# Patient Record
Sex: Female | Born: 1945
Health system: Southern US, Community
[De-identification: ages and names within clinical notes are randomized; demographics above are authoritative.]

## PROBLEM LIST (undated history)

## (undated) DIAGNOSIS — G709 Myoneural disorder, unspecified: Secondary | ICD-10-CM

## (undated) DIAGNOSIS — R7303 Prediabetes: Secondary | ICD-10-CM

## (undated) DIAGNOSIS — E785 Hyperlipidemia, unspecified: Secondary | ICD-10-CM

## (undated) DIAGNOSIS — R5383 Other fatigue: Secondary | ICD-10-CM

## (undated) DIAGNOSIS — E039 Hypothyroidism, unspecified: Secondary | ICD-10-CM

## (undated) DIAGNOSIS — M791 Myalgia, unspecified site: Secondary | ICD-10-CM

## (undated) DIAGNOSIS — H269 Unspecified cataract: Secondary | ICD-10-CM

## (undated) DIAGNOSIS — M1990 Unspecified osteoarthritis, unspecified site: Secondary | ICD-10-CM

## (undated) DIAGNOSIS — T7840XA Allergy, unspecified, initial encounter: Secondary | ICD-10-CM

## (undated) DIAGNOSIS — K648 Other hemorrhoids: Secondary | ICD-10-CM

## (undated) DIAGNOSIS — D649 Anemia, unspecified: Secondary | ICD-10-CM

## (undated) DIAGNOSIS — I8390 Asymptomatic varicose veins of unspecified lower extremity: Secondary | ICD-10-CM

## (undated) DIAGNOSIS — E669 Obesity, unspecified: Secondary | ICD-10-CM

## (undated) DIAGNOSIS — N189 Chronic kidney disease, unspecified: Secondary | ICD-10-CM

## (undated) DIAGNOSIS — E119 Type 2 diabetes mellitus without complications: Secondary | ICD-10-CM

## (undated) DIAGNOSIS — C4491 Basal cell carcinoma of skin, unspecified: Secondary | ICD-10-CM

## (undated) DIAGNOSIS — R0789 Other chest pain: Secondary | ICD-10-CM

## (undated) DIAGNOSIS — K219 Gastro-esophageal reflux disease without esophagitis: Secondary | ICD-10-CM

## (undated) DIAGNOSIS — IMO0002 Reserved for concepts with insufficient information to code with codable children: Secondary | ICD-10-CM

## (undated) DIAGNOSIS — H409 Unspecified glaucoma: Secondary | ICD-10-CM

## (undated) HISTORY — DX: Other chest pain: R07.89

## (undated) HISTORY — PX: CATARACT EXTRACTION: SUR2

## (undated) HISTORY — DX: Anemia, unspecified: D64.9

## (undated) HISTORY — DX: Gastro-esophageal reflux disease without esophagitis: K21.9

## (undated) HISTORY — DX: Hyperlipidemia, unspecified: E78.5

## (undated) HISTORY — DX: Basal cell carcinoma of skin, unspecified: C44.91

## (undated) HISTORY — DX: Asymptomatic varicose veins of unspecified lower extremity: I83.90

## (undated) HISTORY — PX: ROTATOR CUFF REPAIR: SHX139

## (undated) HISTORY — PX: NASAL SINUS SURGERY: SHX719

## (undated) HISTORY — DX: Prediabetes: R73.03

## (undated) HISTORY — DX: Myalgia, unspecified site: M79.10

## (undated) HISTORY — DX: Other fatigue: R53.83

## (undated) HISTORY — PX: BREAST SURGERY: SHX581

## (undated) HISTORY — DX: Chronic kidney disease, unspecified: N18.9

## (undated) HISTORY — DX: Allergy, unspecified, initial encounter: T78.40XA

## (undated) HISTORY — DX: Unspecified glaucoma: H40.9

## (undated) HISTORY — DX: Type 2 diabetes mellitus without complications: E11.9

## (undated) HISTORY — DX: Reserved for concepts with insufficient information to code with codable children: IMO0002

## (undated) HISTORY — PX: TONSILLECTOMY AND ADENOIDECTOMY: SUR1326

## (undated) HISTORY — DX: Obesity, unspecified: E66.9

## (undated) HISTORY — PX: ABDOMINAL HYSTERECTOMY: SHX81

## (undated) HISTORY — DX: Other hemorrhoids: K64.8

## (undated) HISTORY — DX: Unspecified cataract: H26.9

## (undated) HISTORY — PX: EYE SURGERY: SHX253

## (undated) HISTORY — DX: Hypothyroidism, unspecified: E03.9

---

## 1998-04-06 ENCOUNTER — Emergency Department (HOSPITAL_COMMUNITY): Admission: EM | Admit: 1998-04-06 | Discharge: 1998-04-06 | Payer: Self-pay | Admitting: *Deleted

## 1998-04-16 ENCOUNTER — Ambulatory Visit (HOSPITAL_COMMUNITY): Admission: RE | Admit: 1998-04-16 | Discharge: 1998-04-16 | Payer: Self-pay | Admitting: *Deleted

## 1998-05-05 ENCOUNTER — Ambulatory Visit (HOSPITAL_COMMUNITY): Admission: RE | Admit: 1998-05-05 | Discharge: 1998-05-05 | Payer: Self-pay | Admitting: Cardiovascular Disease

## 1998-06-11 ENCOUNTER — Ambulatory Visit (HOSPITAL_COMMUNITY): Admission: RE | Admit: 1998-06-11 | Discharge: 1998-06-11 | Payer: Self-pay | Admitting: *Deleted

## 1998-06-26 ENCOUNTER — Encounter: Payer: Self-pay | Admitting: Internal Medicine

## 1998-07-31 ENCOUNTER — Encounter: Payer: Self-pay | Admitting: Gastroenterology

## 1998-08-13 ENCOUNTER — Other Ambulatory Visit: Admission: RE | Admit: 1998-08-13 | Discharge: 1998-08-13 | Payer: Self-pay | Admitting: *Deleted

## 2001-09-10 ENCOUNTER — Encounter: Payer: Self-pay | Admitting: Internal Medicine

## 2001-09-10 ENCOUNTER — Ambulatory Visit (HOSPITAL_COMMUNITY): Admission: RE | Admit: 2001-09-10 | Discharge: 2001-09-10 | Payer: Self-pay | Admitting: Internal Medicine

## 2001-11-16 ENCOUNTER — Encounter: Payer: Self-pay | Admitting: Internal Medicine

## 2001-11-16 ENCOUNTER — Ambulatory Visit (HOSPITAL_COMMUNITY): Admission: RE | Admit: 2001-11-16 | Discharge: 2001-11-16 | Payer: Self-pay | Admitting: Internal Medicine

## 2003-02-19 ENCOUNTER — Ambulatory Visit (HOSPITAL_COMMUNITY): Admission: RE | Admit: 2003-02-19 | Discharge: 2003-02-19 | Payer: Self-pay | Admitting: Internal Medicine

## 2003-02-19 ENCOUNTER — Encounter: Payer: Self-pay | Admitting: Internal Medicine

## 2006-01-23 ENCOUNTER — Other Ambulatory Visit: Admission: RE | Admit: 2006-01-23 | Discharge: 2006-01-23 | Payer: Self-pay | Admitting: Internal Medicine

## 2008-10-15 ENCOUNTER — Emergency Department (HOSPITAL_COMMUNITY): Admission: EM | Admit: 2008-10-15 | Discharge: 2008-10-15 | Payer: Self-pay | Admitting: Family Medicine

## 2009-04-02 ENCOUNTER — Telehealth: Payer: Self-pay | Admitting: Gastroenterology

## 2009-04-22 ENCOUNTER — Encounter: Admission: RE | Admit: 2009-04-22 | Discharge: 2009-04-22 | Payer: Self-pay | Admitting: Internal Medicine

## 2009-05-04 DIAGNOSIS — K648 Other hemorrhoids: Secondary | ICD-10-CM

## 2009-05-04 DIAGNOSIS — K5730 Diverticulosis of large intestine without perforation or abscess without bleeding: Secondary | ICD-10-CM

## 2009-05-04 DIAGNOSIS — K219 Gastro-esophageal reflux disease without esophagitis: Secondary | ICD-10-CM

## 2009-05-04 HISTORY — DX: Other hemorrhoids: K64.8

## 2009-05-11 ENCOUNTER — Ambulatory Visit: Payer: Self-pay | Admitting: Internal Medicine

## 2009-07-13 ENCOUNTER — Ambulatory Visit: Payer: Self-pay | Admitting: Internal Medicine

## 2010-02-03 ENCOUNTER — Ambulatory Visit (HOSPITAL_COMMUNITY): Admission: RE | Admit: 2010-02-03 | Discharge: 2010-02-03 | Payer: Self-pay | Admitting: Internal Medicine

## 2010-02-03 ENCOUNTER — Encounter: Admission: RE | Admit: 2010-02-03 | Discharge: 2010-02-03 | Payer: Self-pay | Admitting: Internal Medicine

## 2010-02-03 ENCOUNTER — Encounter (INDEPENDENT_AMBULATORY_CARE_PROVIDER_SITE_OTHER): Payer: Self-pay | Admitting: *Deleted

## 2010-02-08 ENCOUNTER — Encounter (INDEPENDENT_AMBULATORY_CARE_PROVIDER_SITE_OTHER): Payer: Self-pay | Admitting: *Deleted

## 2010-03-05 ENCOUNTER — Encounter (INDEPENDENT_AMBULATORY_CARE_PROVIDER_SITE_OTHER): Payer: Self-pay | Admitting: *Deleted

## 2010-04-08 ENCOUNTER — Encounter (INDEPENDENT_AMBULATORY_CARE_PROVIDER_SITE_OTHER): Payer: Self-pay | Admitting: *Deleted

## 2010-05-21 ENCOUNTER — Encounter (INDEPENDENT_AMBULATORY_CARE_PROVIDER_SITE_OTHER): Payer: Self-pay | Admitting: *Deleted

## 2010-05-27 ENCOUNTER — Telehealth: Payer: Self-pay | Admitting: Internal Medicine

## 2010-06-15 ENCOUNTER — Ambulatory Visit: Payer: Self-pay | Admitting: Internal Medicine

## 2010-08-16 ENCOUNTER — Ambulatory Visit: Payer: Self-pay | Admitting: Internal Medicine

## 2010-12-17 ENCOUNTER — Observation Stay (HOSPITAL_COMMUNITY)
Admission: EM | Admit: 2010-12-17 | Discharge: 2010-12-18 | Payer: Self-pay | Source: Home / Self Care | Attending: Internal Medicine | Admitting: Internal Medicine

## 2010-12-17 LAB — POCT I-STAT, CHEM 8
BUN: 13 mg/dL (ref 6–23)
Calcium, Ion: 1.15 mmol/L (ref 1.12–1.32)
Chloride: 110 mEq/L (ref 96–112)
Creatinine, Ser: 0.9 mg/dL (ref 0.4–1.2)
Glucose, Bld: 100 mg/dL — ABNORMAL HIGH (ref 70–99)
TCO2: 23 mmol/L (ref 0–100)

## 2010-12-17 LAB — DIFFERENTIAL
Lymphocytes Relative: 34 % (ref 12–46)
Lymphs Abs: 3.8 10*3/uL (ref 0.7–4.0)
Monocytes Relative: 6 % (ref 3–12)
Neutro Abs: 6.2 10*3/uL (ref 1.7–7.7)
Neutrophils Relative %: 56 % (ref 43–77)

## 2010-12-17 LAB — POCT CARDIAC MARKERS: Troponin i, poc: 0.1 ng/mL — ABNORMAL HIGH (ref 0.00–0.09)

## 2010-12-17 LAB — LIPID PANEL
LDL Cholesterol: 83 mg/dL (ref 0–99)
Total CHOL/HDL Ratio: 3.5 RATIO
Triglycerides: 196 mg/dL — ABNORMAL HIGH (ref <150)
VLDL: 39 mg/dL (ref 0–40)

## 2010-12-17 LAB — CBC
Hemoglobin: 13.8 g/dL (ref 12.0–15.0)
MCH: 29.4 pg (ref 26.0–34.0)
MCV: 86.1 fL (ref 78.0–100.0)
Platelets: 304 10*3/uL (ref 150–400)
RBC: 4.69 MIL/uL (ref 3.87–5.11)
WBC: 11.2 10*3/uL — ABNORMAL HIGH (ref 4.0–10.5)

## 2010-12-17 LAB — CK TOTAL AND CKMB (NOT AT ARMC)
CK, MB: 3.1 ng/mL (ref 0.3–4.0)
Relative Index: INVALID (ref 0.0–2.5)

## 2010-12-17 LAB — TSH: TSH: 1.072 u[IU]/mL (ref 0.350–4.500)

## 2010-12-17 LAB — TROPONIN I: Troponin I: 0.02 ng/mL (ref 0.00–0.06)

## 2010-12-17 LAB — CARDIAC PANEL(CRET KIN+CKTOT+MB+TROPI)
CK, MB: 2 ng/mL (ref 0.3–4.0)
Relative Index: INVALID (ref 0.0–2.5)
Total CK: 73 U/L (ref 7–177)
Troponin I: 0.02 ng/mL (ref 0.00–0.06)

## 2010-12-17 LAB — D-DIMER, QUANTITATIVE: D-Dimer, Quant: <0.22 ug/mL-FEU (ref 0.00–0.48)

## 2010-12-17 NOTE — H&P (Signed)
Brandi, [name]                ACCOUNT NO.:  [id number]  MEDICAL RECORD NO.:  [id number]          PATIENT TYPE:  EMS  LOCATION:  ED                           FACILITY:  [facility]  PHYSICIAN:  Clydia Llano, MD       DATE OF BIRTH:  11/29/1945  DATE OF ADMISSION:  12/17/2010 DATE OF DISCHARGE:                             HISTORY & PHYSICAL   PRIMARY CARE PHYSICIAN:  Lovenia Kim, D.O.  REASON FOR ADMISSION:  Palpitations.  HISTORY OF PRESENT ILLNESS:  [name] is a 65 year old female with history of hypertension and hyperlipidemia, came in complaining about palpitations.  The patient has symptoms on and off for the past 2 weeks, every 2 to 3 nights.  The patient stated that she wakes up at middle of the night with heart racing and feeling tingling in her extremities as well as tingling in her lips and jaw.  The patient also sweats.  The episode lasts for few minutes after the patient relaxes and get back to normal and she goes back to sleep.  The patient had similar symptoms about 3 years ago which was diagnosed as panic attacks and the patient was put on anxiolytic.  The patient mentioned that her husband died in 01/08/23, about 3 years ago and she remembers him now.  Upon initial evaluation in the ED, chest x-ray was negative.  First set of cardiac enzymes was negative.  The point of care troponin was 0.1.  The patient was admitted for further evaluation.  PAST MEDICAL HISTORY: 1. Hypertension. 2. Dyslipidemia. 3. Hypothyroidism. 4. Gastroesophageal reflux disease. 5. Anxiety. 6. Arthritis. 7. Surgical extraction of wisdom tooth. 8. Hysterectomy.  SOCIAL HISTORY:  The patient lives alone.  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  No new stressors at work.  The patient smokes less than half pack per day.  The patient used to smoke 1 pack per day for more than 30 years, cut down 3 years ago.  The patient does not drink or use street drugs.  FAMILY HISTORY:   Father died at 28 secondary to massive heart attack. Mother died at 79 secondary to lung cancer.  ALLERGIES: 1. PENICILLIN. 2. NOVOCAINE. 3. SULFA. 4. TETANUS TOXOID.  HOME MEDICATIONS: 1. Nortriptyline 25 mg 4 times a day. 2. Estradiol 1 mg p.o. daily. 3. Diltiazem ER 240 mg p.o. daily. 4. Lipitor 40 mg p.o. daily. 5. Meloxicam 7.5 p.o. b.i.d. 6. Synthroid 125 mcg p.o. daily. 7. Nexium 40 mg p.o. daily. 8. Calcium 1200 mg p.o. daily. 9. Multivitamin 1 tablet p.o. daily. 10.Aspirin 81 mg p.o. daily. 11.Vitamin D 4000 international units p.o. daily. 12.Align 1 tablet p.o. daily. 13.Vitamin B1 OTC 1 tablet p.o. daily.  REVIEW OF SYSTEMS:  A 12-point review of system was negative except for the symptoms mentioned in the HPI.  PHYSICAL EXAMINATION:  VITAL SIGNS:  Temperature is 98.0, respirations 20, pulse is 92, blood pressure is 134/73. GENERAL:  The patient is well-developed, well-nourished hydrated in no acute distress Caucasian female speaking complete sentences.  Pulse ox is 98% on room air. HEENT:  Head and face normocephalic, atraumatic.  Eyes, normal appearance.  Pupils equal, reactive to light and accommodation. NECK:  Supple and full range of motion.  No soft tissue tenderness. CARDIOVASCULAR:  Regular rate and rhythm.  No murmurs, rubs or gallops. RESPIRATORY:  Clear to auscultation bilaterally. CHEST:  Nontender.  Movement is normal with breathing. ABDOMEN:  Bowel sounds heard.  Soft, nontender or distended.  No hepatosplenomegaly appreciated. EXTREMITIES:  Neurovascularly is intact.  No pedal edema. NEUROLOGIC:  Alert, awake, oriented x3.  Cranial nerves II-XII grossly intact.  Normal reflexes.  Gait normal.  Normal coordination.  Normal speech. SKIN:  Color normal.  No rash.  Warm and dry. PSYCHIATRIC:  Alert, awake and oriented x4.  EKG, normal sinus rhythm.  LABORATORY TEST: 1. Cardiac enzymes, troponin 0.02, CK 85, CK-MB 3.1. 2. CBC, WBC is 11.2,  hemoglobin 13.8, hematocrit 40.4 and platelets     304,000. 3. Chem-8 panel, sodium 141, potassium 3.8, chloride 110, BUN 13,     creatinine 0.8, glucose 100.  RADIOLOGY:  Chest x-ray 2 views showed no active cardiopulmonary disease.  ASSESSMENT/PLAN: 1. Palpitations.  From the history the patient is telling it is likely     panic attack.  The patient will be admitted to rule out any cardiac     involvement or acute coronary syndrome.  The patient will be on     observation on telemetry.  Cardiac enzymes will be done.  EKG will     be repeated in the morning if everything negative.  The patient     probably needs to follow up with her primary care physician to     intensify the anxiolytic therapy. 2. Hypertension, controlled.  Continue her home medication. 3. Dyslipidemia.  We will check fasting lipid profile and will     continue her home medication. 4. Hypothyroidism.  The patient is taking 125 mcg of Synthroid.  We     will check her TSH as high dose of Synthroid can cause     palpitations.     Clydia Llano, MD     ME/MEDQ  D:  12/17/2010  T:  12/17/2010  Job:  [id number]  cc:   Lovenia Kim, D.O. Fax: [phone]  Electronically Signed by Clydia Llano  on 12/17/2010 08:32:33 PM

## 2010-12-18 LAB — CBC
HCT: 37.2 % (ref 36.0–46.0)
MCH: 28.5 pg (ref 26.0–34.0)
MCHC: 32.8 g/dL (ref 30.0–36.0)
MCV: 86.9 fL (ref 78.0–100.0)
Platelets: 262 10*3/uL (ref 150–400)
RDW: 13.8 % (ref 11.5–15.5)

## 2010-12-18 LAB — RAPID URINE DRUG SCREEN, HOSP PERFORMED
Barbiturates: NOT DETECTED
Benzodiazepines: NOT DETECTED
Cocaine: NOT DETECTED
Opiates: NOT DETECTED

## 2010-12-18 LAB — URINALYSIS, ROUTINE W REFLEX MICROSCOPIC
Nitrite: NEGATIVE
Specific Gravity, Urine: 1.012 (ref 1.005–1.030)
Urine Glucose, Fasting: NEGATIVE mg/dL
pH: 7 (ref 5.0–8.0)

## 2010-12-18 LAB — COMPREHENSIVE METABOLIC PANEL
Alkaline Phosphatase: 77 U/L (ref 39–117)
BUN: 10 mg/dL (ref 6–23)
Calcium: 8.8 mg/dL (ref 8.4–10.5)
Creatinine, Ser: 0.89 mg/dL (ref 0.4–1.2)
Glucose, Bld: 103 mg/dL — ABNORMAL HIGH (ref 70–99)
Potassium: 3.8 mEq/L (ref 3.5–5.1)
Total Protein: 6.1 g/dL (ref 6.0–8.3)

## 2010-12-19 LAB — URINE CULTURE
Culture  Setup Time: 201201281136
Culture: NO GROWTH
Special Requests: NEGATIVE

## 2010-12-21 NOTE — Progress Notes (Signed)
Summary: Sooner Appt. Req.  Phone Note From Other Clinic Call back at [phone]   Caller: Lelon Mast from Dr Wilhemena Durie office Call For: [name] Reason for Call: Schedule Patient Appt Summary of Call: Dr Elisabeth Most would like this patient seen before appt date 8-10 for ? obstructions, iron def anemia . Initial call taken by: Tawni Levy,  May 27, 2010 2:45 PM  Follow-up for Phone Call        Pt. will see Dr.Shamere Campas on 05-31-10 at 1:45pm. Lelon Mast will advise pt. of appt/med.list/co-pay/cx.policy and she will fax records to [facility].  Follow-up by: Laureen Ochs LPN,  May 28, 4539 3:09 PM

## 2010-12-21 NOTE — Letter (Signed)
Summary: Lab Report-Spectrum  Lab Report-Spectrum   Imported By: Lamona Curl CMA (AAMA) 05/28/2010 16:36:51  _____________________________________________________________________  External Attachment:    Type:   Image     Comment:   External Document

## 2010-12-21 NOTE — Assessment & Plan Note (Signed)
Summary: abnormal ct, consult colon, low iron   History of Present Illness Visit Type: Follow-up Consult Primary GI MD: Lina Sar MD Primary Provider: Marisue Brooklyn, DO Requesting Provider: Marisue Brooklyn, DO Chief Complaint: Abnormanl CT, Iron def. consult History of Present Illness:   This is a 65 year old white white female with an acute episode of abdominal pain in March 2011 and an abnormal CT scan at that time showing stranding in the left colon and proximal sigmoid colon with colonic wall thickening and diverticulosis. There was increased stool in the left colon, a small right ovarian cyst and a left renal stone. She had an appointment with [place] in May but could not make it. She was found to be iron deficient at the time with a serum iron of 37 and 14% iron saturation. Her B12 level was 342 and folate was 704. She was put on iron but did not take it regularly so the repeat iron saturation in May 2011 was still at 12%. In July 2011, her iron saturation was only at 13%. She has not taken ferrous sulfate twice a day. She denies any diarrhea, abdominal pain of constipation. There is no family history of colon cancer. She was supposed to have a screening colonoscopy last year but cancelled several times and never rescheduled it.   GI Review of Systems    Reports bloating.      Denies abdominal pain, acid reflux, belching, chest pain, dysphagia with liquids, dysphagia with solids, heartburn, loss of appetite, nausea, vomiting, vomiting blood, weight loss, and  weight gain.      Reports change in bowel habits.     Denies anal fissure, black tarry stools, constipation, diarrhea, diverticulosis, fecal incontinence, heme positive stool, hemorrhoids, irritable bowel syndrome, jaundice, light color stool, liver problems, rectal bleeding, and  rectal pain.    Current Medications (verified): 1)  Nortriptyline Hcl 25 Mg Caps (Nortriptyline Hcl) .Marland Kitchen.. 1 Capsule By Mouth Qid 2)  Estrace 1 Mg Tabs  (Estradiol) .Marland Kitchen.. 1 Tablet By Mouth Once Daily 3)  Diltiazem Hcl Er Beads 240 Mg Xr24h-Cap (Diltiazem Hcl Er Beads) .Marland Kitchen.. 1 Capsule By Mouth Once Daily 4)  Lipitor 40 Mg Tabs (Atorvastatin Calcium) .Marland Kitchen.. 1 Tablet By Mouth Once Daily 5)  Meloxicam 7.5 Mg Tabs (Meloxicam) .Marland Kitchen.. 1 Tablet By Mouth Two Times A Day 6)  Synthroid 125 Mcg Tabs (Levothyroxine Sodium) .Marland Kitchen.. 1 Tablet By Mouth Once Daily 7)  Nexium 40 Mg Cpdr (Esomeprazole Magnesium) .Marland Kitchen.. 1 Capsule 8)  Aspirin 81 Mg Tbec (Aspirin) .Marland Kitchen.. 1 Tablet By Mouth Once Daily 9)  Calcium 600 1500 Mg Tabs (Calcium Carbonate) .... 2 Tablets By Mouth Once Daily 10)  Centrum Silver  Tabs (Multiple Vitamins-Minerals) .Marland Kitchen.. 1 Tablet By Mouth Once Daily 11)  Vitamin D3 1000 Unit Tabs (Cholecalciferol) .... Take 4000iu Daily 12)  Probiotic  Caps (Probiotic Product) .... Once Daily 13)  B Complex  Tabs (B Complex Vitamins) .... Once Daily 14)  Ferrous Sulfate 325 (65 Fe) Mg Tbec (Ferrous Sulfate) .... Once Daily  Allergies (verified): 1)  ! * Tetanus 2)  ! Pcn 3)  ! Sulfa 4)  ! Novocain 5)  ! * Ppd/skin Test  Past History:  Past Medical History: Reviewed history from 05/11/2009 and no changes required. Current Problems:  GERD (ICD-530.81) INTERNAL HEMORRHOIDS (ICD-455.0) DIVERTICULOSIS OF COLON (ICD-562.10) Anxiety Disorder Arthritis Hyperlipidemia Hypothyroidism Obesity Ulcers in GI Tract  Past Surgical History: Reviewed history from 05/11/2009 and no changes required. Unremarkable Hysterectomy  Family History: Reviewed history from  05/11/2009 and no changes required. Family History of Heart Disease: Father No FH of Colon Cancer:  Social History: Reviewed history from 05/11/2009 and no changes required. Alcohol Use - no Illicit Drug Use - no Occupation: Recovery Analyst Patient currently smokes.  Daily Caffeine Use  Vital Signs:  Patient profile:   65 year old female Height:      68 inches Weight:      207.25 pounds BMI:      31.63 Pulse rate:   76 / minute Pulse rhythm:   regular BP sitting:   102 / 60  (left arm) Cuff size:   regular  Vitals Entered By: June McMurray CMA Duncan Dull) (June 15, 2010 8:36 AM)  Physical Exam  General:  alert, oriented and no distress. Eyes:  PERRLA, no icterus. Mouth:  No deformity or lesions, dentition normal. Neck:  Supple; no masses or thyromegaly. Lungs:  Clear throughout to auscultation. Heart:  Regular rate and rhythm; no murmurs, rubs,  or bruits. Abdomen:  Soft, nontender and nondistended. No masses, hepatosplenomegaly or hernias noted. Normal bowel sounds. Rectal:  soft, dark Hemoccult negative stool. Extremities:  No clubbing, cyanosis, edema or deformities noted. Skin:  Intact without significant lesions or rashes. Psych:  Alert and cooperative. Normal mood and affect.   Impression & Recommendations:  Problem # 1:  DIVERTICULOSIS OF COLON (ICD-562.10)  She is status post attack of acute abdominal pain of unknown etiology which now has resolved. A CT Scan suggested acute colitis either infectious or ischemic. She is now Hemoccult negative. Patient has iron deficiency which did not respond to oral supplements. She did not take her iron as she was supposed to. We will proceed with a colonoscopy to rule out an occult colon lesion and to rule out inflammatory bowel disease.  Orders: Colonoscopy (Colon)  Problem # 2:  ANEMIA, SECONDARY TO BLOOD LOSS (ICD-280.0)  Patient has persistent iron deficiency. Patient is on iron supplements. We will proceed with a colonoscopy.  Orders: Colonoscopy (Colon)  Patient Instructions: 1)  schedule colonoscopy with MiraLax prep. 2)  Continue iron supplements until 5 days prior to the colonoscopy. 3)  Copy sent to : Dr A.Stevenson 4)  The medication list was reviewed and reconciled.  All changed / newly prescribed medications were explained.  A complete medication list was provided to the patient /  caregiver. Prescriptions: DULCOLAX 5 MG  TBEC (BISACODYL) Day before procedure take 2 at 3pm and 2 at 8pm.  #4 x 0   Entered by:   Lamona Curl CMA (AAMA)   Authorized by:   Hart Carwin MD   Signed by:   Lamona Curl CMA (AAMA) on 06/15/2010   Method used:   Electronically to        [facility] [phone]* (retail)       [address]       [place], [place]  [address]       Ph: [phone] or [phone]       Fax: [phone]   RxID:   [id number] REGLAN 10 MG  TABS (METOCLOPRAMIDE HCL) As per prep instructions.  #2 x 0   Entered by:   Lamona Curl CMA (AAMA)   Authorized by:   Hart Carwin MD   Signed by:   Lamona Curl CMA (AAMA) on 06/15/2010   Method used:   Electronically to        [facility] [phone]* (retail)       [address]  [place], [place]  [address]       Ph: [phone] or [phone]       Fax: [phone]   RxID:   [id number] MIRALAX   POWD (POLYETHYLENE GLYCOL 3350) As per prep  instructions.  #255 grams x 0   Entered by:   Lamona Curl CMA (AAMA)   Authorized by:   Hart Carwin MD   Signed by:   Lamona Curl CMA (AAMA) on 06/15/2010   Method used:   Electronically to        [facility] [phone]* (retail)       [address]       [place], [place]  [address]       Ph: [phone] or [phone]       Fax: [phone]   RxID:   [id number]

## 2010-12-21 NOTE — Procedures (Signed)
Summary: Colonoscopy  Patient: [name] Note: All result statuses are Final unless otherwise noted.  Tests: (1) Colonoscopy (COL)   COL Colonoscopy           DONE     [facility]     [address]     [place], [place]  [address]           COLONOSCOPY PROCEDURE REPORT           PATIENT:  [name], [name]  MR#:  [id number]     BIRTHDATE:  12-11-1945, 64 yrs. old  GENDER:  female     ENDOSCOPIST:  Hedwig Morton. Juanda Chance, MD     REF. BY:  Marisue Brooklyn, D.O.     PROCEDURE DATE:  08/16/2010     PROCEDURE:  Colonoscopy [id number]     ASA CLASS:  Class II     INDICATIONS:  Routine Risk Screening     MEDICATIONS:   Versed 11 mg, Fentanyl 112.5 mcg           DESCRIPTION OF PROCEDURE:   After the risks benefits and     alternatives of the procedure were thoroughly explained, informed     consent was obtained.  Digital rectal exam was performed and     revealed no rectal masses.   The LB [id number]-[id number] endoscope     was introduced through the anus and advanced to the cecum, which     was identified by both the appendix and ileocecal valve, without     limitations.  The quality of the prep was good, using MiraLax.     The instrument was then slowly withdrawn as the colon was fully     examined.     <<PROCEDUREIMAGES>>           FINDINGS:  Mild diverticulosis was found in the sigmoid colon (see     image1, image5, image6, and image7).  This was otherwise a normal     examination of the colon (see image2, image3, image4, and image8).     Retroflexed views in the rectum revealed no abnormalities.    The     scope was then withdrawn from the patient and the procedure     completed.           COMPLICATIONS:  None     ENDOSCOPIC IMPRESSION:     1) Mild diverticulosis in the sigmoid colon     2) Otherwise normal examination     RECOMMENDATIONS:     1) high fiber diet     REPEAT EXAM:  In 10 year(s) for.           ______________________________     Hedwig Morton. Juanda Chance, MD           CC:          n.     eSIGNED:   Hedwig Morton. Brodie at 08/16/2010 10:54 AM           Vic Ripper, [id number]  Note: An exclamation mark (!) indicates a result that was not dispersed into the flowsheet. Document Creation Date: 08/16/2010 10:55 AM _______________________________________________________________________  (1) Order result status: Final Collection or observation date-time: 08/16/2010 10:48 Requested date-time:  Receipt date-time:  Reported date-time:  Referring Physician:   Ordering Physician: Lina Sar [phone]) Specimen Source:  Source: Launa Grill Order Number: [id number] Lab site:   Appended Document: Colonoscopy    Clinical Lists Changes  Observations: Added new observation of COLONNXTDUE: 07/2020 (08/16/2010 12:53)

## 2010-12-21 NOTE — Letter (Signed)
Summary: [facility] Instructions  [facility]  [address] [place], [place] [address]   Phone: [phone]  Fax: [phone]       [name]    29-Apr-1946    MRN: [id number]       Procedure Day /Date: 07/09/10 Friday     Arrival Time: 7:30 am     Procedure Time: 8:30 am     Location of Procedure:                    _ x_  [facility] (4th Floor)  PREPARATION FOR COLONOSCOPY WITH MIRALAX  Starting 5 days prior to your procedure (06/29/10) do not eat nuts, seeds, popcorn, corn, beans, peas,  salads, or any raw vegetables.  Do not take any fiber supplements (e.g. Metamucil, Citrucel, and Benefiber). ____________________________________________________________________________________________________   THE DAY BEFORE YOUR PROCEDURE         DATE: 07/08/10 DAY: Thursday  1   Drink clear liquids the entire day-NO SOLID FOOD  2   Do not drink anything colored red or purple.  Avoid juices with pulp.  No orange juice.  3   Drink at least 64 oz. (8 glasses) of fluid/clear liquids during the day to prevent dehydration and help the prep work efficiently.  CLEAR LIQUIDS INCLUDE: Water Jello Ice Popsicles Tea (sugar ok, no milk/cream) Powdered fruit flavored drinks Coffee (sugar ok, no milk/cream) Gatorade Juice: apple, white grape, white cranberry  Lemonade Clear bullion, consomm, broth Carbonated beverages (any kind) Strained chicken noodle soup Hard Candy  4   Mix the entire bottle of Miralax with 64 oz. of Gatorade/Powerade in the morning and put in the refrigerator to chill.  5   At 3:00 pm take 2 Dulcolax/Bisacodyl tablets.  6   At 4:30 pm take one Reglan/Metoclopramide tablet.  7  Starting at 5:00 pm drink one 8 oz glass of the Miralax mixture every 15-20 minutes until you have finished drinking the entire 64 oz.  You should finish drinking prep around 7:30 or 8:00 pm.  8   If you are nauseated, you may take the 2nd Reglan/Metoclopramide tablet  at 6:30 pm.        9    At 8:00 pm take 2 more DULCOLAX/Bisacodyl tablets.        THE DAY OF YOUR PROCEDURE      DATE:  07/09/10 DAY: Friday  You may drink clear liquids until 6:30 am  (2 HOURS BEFORE PROCEDURE).   MEDICATION INSTRUCTIONS  Unless otherwise instructed, you should take regular prescription medications with a small sip of water as early as possible the morning of your procedure.        OTHER INSTRUCTIONS  You will need a responsible adult at least 63 years of age to accompany you and drive you home.   This person must remain in the waiting room during your procedure.  Wear loose fitting clothing that is easily removed.  Leave jewelry and other valuables at home.  However, you may wish to bring a book to read or an iPod/MP3 player to listen to music as you wait for your procedure to start.  Remove all body piercing jewelry and leave at home.  Total time from sign-in until discharge is approximately 2-3 hours.  You should go home directly after your procedure and rest.  You can resume normal activities the day after your procedure.  The day of your procedure you should not:   Drive   Make  legal decisions   Operate machinery   Drink alcohol   Return to work  You will receive specific instructions about eating, activities and medications before you leave.   The above instructions have been reviewed and explained to me by  Lamona Curl CMA Duncan Dull)  June 15, 2010 9:39 AM     I fully understand and can verbalize these instructions _____________________________ Date 06/15/10

## 2010-12-29 NOTE — Discharge Summary (Signed)
[name], [name]                ACCOUNT NO.:  [id number]  MEDICAL RECORD NO.:  [id number]          PATIENT TYPE:  INP  LOCATION:  1401                         FACILITY:  [facility]  PHYSICIAN:  Clydia Llano, MD       DATE OF BIRTH:  07/01/1946  DATE OF ADMISSION:  12/17/2010 DATE OF DISCHARGE:                              DISCHARGE SUMMARY   PRIMARY CARE PHYSICIAN:  Lovenia Kim, D.O.  REASON FOR ADMISSION:  Palpitation, rule out acute coronary syndrome.  DISCHARGE DIAGNOSES: 1. Palpitations, probable panic attack. 2. Probable panic attacks. 3. Hypertension. 4. Gastroesophageal reflux disease. 5. Anxiety. 6. Arthritis. 7. Hypothyroidism. 8. Dyslipidemia. 9. Hypertension. 10.Surgical extraction of wisdom tooth. 11.Hysterectomy.  DISCHARGE MEDICATIONS: 1. Align 4 mg one p.o. daily. 2. Aspirin 81 mg p.o. daily. 3. Calcium carbonate/vitamin D 1200 mg/600 mg p.o. b.i.d. 4. Diltiazem CD 240 mg p.o. daily. 5. Tridil 1 mg p.o. daily. 6. Lipitor 40 mg p.o. daily. 7. Meloxicam 7.5 mg p.o. b.i.d. 8. Multivitamin one tablet p.o. daily. 9. Nexium 40 mg p.o. daily. 10.Nortriptyline 25 mg two capsules p.o. b.i.d. 11.Synthroid 125 mcg p.o. daily. 12.Vitamin B complex with C one tablet p.o. daily. 13.Vitamin D OTC one tablet p.o. daily.  BRIEF HISTORY AND EXAMINATION:  [name] is a 65 year old Caucasian female with history of hypertension and hyperlipidemia who came in complaining about palpitations.  The patient had symptoms on and off for the past 2 weeks every two to three nights.  The patient wakes up in the middle of the night with heart racing, feeling tingling in her extremities as well as her lips and jaw.  The patient also complaining about sweating.  The patient denies any chest pain.  Denies any shortness of breath.  Usually symptoms last for a few minutes.  After that the patient relaxes and goes back to sleep.  On the day of admission the patient came  into the hospital because she felt the palpitations were more intense than she usually gets.  Upon initial evaluation in the Emergency Department, the point of care troponin was 0.1 but the first serum cardiac enzymes were negative.  The patient was admitted for further evaluation to the hospital. 1. Palpitation.  The patient was admitted to rule out acute coronary     syndrome.  Three sets of cardiac enzymes were negative.  A 12-lead     EKG also was negative for ischemic changes.  From the     symptomatology that the patient mentioned, the patient's symptoms     are likely secondary to a panic attack.  TSH was normal.  The     patient was not hypovolemic.  The patient was counseled about     decreasing caffeine intake in soda, tea and coffee as well as     followup with Dr. Elisabeth Most.  The patient also might benefit from a     sleep study if the panic attacks only happen at night. 2. Hypertension.  The patient's blood pressure is controlled.  The     patient is taking diltiazem.  Systolic blood pressure was in the  130s in the hospital. 3. Dyslipidemia.  Total cholesterol 170.  HDL is 48 and LDL is 83.     Also the patient is taking a statin for her dyslipidemia.  Please     note that her urine drug screen also was negative.  D-dimers were     negative.  DISCHARGE INSTRUCTIONS: 1. Disposition home. 2. Activity as tolerated. 3. Diet:  Heart-healthy diet.     Clydia Llano, MD     ME/MEDQ  D:  12/18/2010  T:  12/18/2010  Job:  [id number]  cc:   Lovenia Kim, D.O. Fax: [phone]  Electronically Signed by Clydia Llano  on 12/29/2010 06:44:40 PM

## 2011-03-10 ENCOUNTER — Other Ambulatory Visit (HOSPITAL_COMMUNITY)
Admission: RE | Admit: 2011-03-10 | Discharge: 2011-03-10 | Disposition: A | Discharge status: Home / Self Care | Payer: Managed Care, Other (non HMO) | Source: Ambulatory Visit | Attending: Internal Medicine | Admitting: Internal Medicine

## 2011-03-10 DIAGNOSIS — Z01419 Encounter for gynecological examination (general) (routine) without abnormal findings: Secondary | ICD-10-CM | POA: Insufficient documentation

## 2011-11-22 DIAGNOSIS — C439 Malignant melanoma of skin, unspecified: Secondary | ICD-10-CM

## 2011-11-22 HISTORY — DX: Malignant melanoma of skin, unspecified: C43.9

## 2012-02-20 ENCOUNTER — Encounter: Payer: Self-pay | Admitting: Cardiology

## 2012-03-15 ENCOUNTER — Encounter: Payer: Self-pay | Admitting: Cardiology

## 2012-03-15 ENCOUNTER — Ambulatory Visit (HOSPITAL_COMMUNITY)
Admission: RE | Admit: 2012-03-15 | Discharge: 2012-03-15 | Disposition: A | Discharge status: Home / Self Care | Payer: Managed Care, Other (non HMO) | Source: Ambulatory Visit | Attending: Internal Medicine | Admitting: Internal Medicine

## 2012-03-15 ENCOUNTER — Other Ambulatory Visit (HOSPITAL_COMMUNITY): Payer: Self-pay | Admitting: Internal Medicine

## 2012-03-15 DIAGNOSIS — R05 Cough: Secondary | ICD-10-CM | POA: Insufficient documentation

## 2012-03-15 DIAGNOSIS — M7989 Other specified soft tissue disorders: Secondary | ICD-10-CM | POA: Insufficient documentation

## 2012-03-15 DIAGNOSIS — M79609 Pain in unspecified limb: Secondary | ICD-10-CM

## 2012-03-15 DIAGNOSIS — R059 Cough, unspecified: Secondary | ICD-10-CM | POA: Insufficient documentation

## 2012-04-09 ENCOUNTER — Ambulatory Visit (INDEPENDENT_AMBULATORY_CARE_PROVIDER_SITE_OTHER): Payer: Managed Care, Other (non HMO) | Admitting: Cardiology

## 2012-04-09 ENCOUNTER — Encounter: Payer: Self-pay | Admitting: Cardiology

## 2012-04-09 VITALS — BP 116/71 | HR 83 | Ht 68.0 in | Wt 220.4 lb

## 2012-04-09 DIAGNOSIS — Z720 Tobacco use: Secondary | ICD-10-CM

## 2012-04-09 DIAGNOSIS — F17200 Nicotine dependence, unspecified, uncomplicated: Secondary | ICD-10-CM

## 2012-04-09 DIAGNOSIS — Z8249 Family history of ischemic heart disease and other diseases of the circulatory system: Secondary | ICD-10-CM

## 2012-04-09 DIAGNOSIS — R9431 Abnormal electrocardiogram [ECG] [EKG]: Secondary | ICD-10-CM

## 2012-04-09 DIAGNOSIS — Z87891 Personal history of nicotine dependence: Secondary | ICD-10-CM | POA: Insufficient documentation

## 2012-04-09 DIAGNOSIS — E782 Mixed hyperlipidemia: Secondary | ICD-10-CM | POA: Insufficient documentation

## 2012-04-09 DIAGNOSIS — E785 Hyperlipidemia, unspecified: Secondary | ICD-10-CM

## 2012-04-09 MED ORDER — VARENICLINE TARTRATE 1 MG PO TABS: 1.0000 mg | ORAL_TABLET | Freq: Two times a day (BID) | ORAL | Status: AC

## 2012-04-09 MED ORDER — VARENICLINE TARTRATE 0.5 MG X 11 & 1 MG X 42 PO MISC: ORAL | Status: AC

## 2012-04-09 NOTE — Assessment & Plan Note (Signed)
We discussed a specific strategy for tobacco cessation.  (Greater than three minutes discussing tobacco cessation.)  She does want to try Chantix.  We discussed all of the potential side effects and in particular I reviewed the [employer].  She has no depression and understands and wishes to try this medication.

## 2012-04-09 NOTE — Assessment & Plan Note (Signed)
Her LDL was 80 and HDL of 50. She will remain on medications as listed.

## 2012-04-09 NOTE — Patient Instructions (Signed)
Please start Chantix as directed The current medical regimen is effective;  continue present plan and medications.  Your physician has requested that you have an exercise tolerance test. For further information please visit [email or web address] Please also follow instruction sheet, as given.

## 2012-04-09 NOTE — Assessment & Plan Note (Signed)
I think this is likely secondary to lead placement. However, she does have significant cardiovascular risk factors. I will bring the patient back for a POET (Plain Old Exercise Test). This will allow me to screen for obstructive coronary disease, risk stratify and very importantly provide a prescription for exercise.

## 2012-04-09 NOTE — Progress Notes (Signed)
HPI The patient was referred for evaluation of an abnormal EKG. She did have a cardiac catheterization she thinks in 1999. She was told she had coronary spasm.  She was recently noted to have an abnormal EKG. I have reviewed this and there was poor anterior R wave progression. She did have an episode several nights ago waking up feeling very hot. However she didn't describe chest pressure or arm discomfort. She didn't have not see a vomiting or diaphoresis. She hasn't had any shortness of breath, PND or orthopnea. She's had no palpitations, presyncope or syncope. She does do some vacuuming and walks the dog. She doesn't bring symptoms on with this.  Allergies  Allergen Reactions  . Levaquin (Levofloxacin In D5w)   . Macrobid (Nitrofurantoin Macrocrystal)   . Penicillins     REACTION: rash  . Procaine Hcl     REACTION: difficulty breathing  . Sulfonamide Derivatives     REACTION: hives  . Tetanus Toxoid     REACTION: arm swells    Current Outpatient Prescriptions  Medication Sig Dispense Refill  . atorvastatin (LIPITOR) 40 MG tablet Take 40 mg by mouth daily.      Marland Kitchen b complex vitamins tablet Take 1 tablet by mouth daily.      Marland Kitchen diltiazem (TIAZAC) 240 MG 24 hr capsule Take 240 mg by mouth daily.      Marland Kitchen esomeprazole (NEXIUM) 40 MG capsule Take 40 mg by mouth daily before breakfast.      . Estradiol (ESTRACE PO) Take by mouth.      . furosemide (LASIX) 20 MG tablet Take 20 mg by mouth daily. Take 2 tabs in am      . levothyroxine (SYNTHROID, LEVOTHROID) 125 MCG tablet Take 125 mcg by mouth daily.      . Multiple Vitamin (MULTIVITAMIN) tablet Take 1 tablet by mouth daily.      . nortriptyline (PAMELOR) 25 MG capsule Take 25 mg by mouth 2 (two) times daily.       . pregabalin (LYRICA) 50 MG capsule Take 50 mg by mouth 3 (three) times daily.      . varenicline (CHANTIX CONTINUING MONTH PAK) 1 MG tablet Take 1 tablet (1 mg total) by mouth 2 (two) times daily.  60 tablet  3  . varenicline  (CHANTIX STARTING MONTH PAK) 0.5 MG X 11 & 1 MG X 42 tablet Take one 0.5 mg tablet by mouth once daily for 3 days, then increase to one 0.5 mg tablet twice daily for 4 days, then increase to one 1 mg tablet twice daily.  53 tablet  0    Past Medical History  Diagnosis Date  . Varicose veins   . Obesity   . Fatigue   . Chest tightness   . Myalgia   . Hypothyroid   . GERD (gastroesophageal reflux disease)   . Ulcer   . Anemia   . Hyperlipidemia     Past Surgical History  Procedure Date  . Abdominal hysterectomy   . Tonsillectomy and adenoidectomy   . Cataract extraction   . Nasal sinus surgery     Family History  Problem Relation Age of Onset  . Coronary artery disease Father 80    History   Social History  . Marital Status: Widowed    Spouse Name: N/A    Number of Children: 1  . Years of Education: N/A   Occupational History  . [employer]URE DE[employer]   Social History Main Topics  .  Smoking status: Current Everyday Smoker -- 0.5 packs/day for 46 years    Types: Cigarettes  . Smokeless tobacco: Not on file  . Alcohol Use: Not on file  . Drug Use: Not on file  . Sexually Active: Not on file   Other Topics Concern  . Not on file   Social History Narrative   Lives alone.  Widow.      ROS:  Neuropathy. Otherwise as stated in the HPI and negative for all other systems.   PHYSICAL EXAM BP 116/71  Pulse 83  Ht 5\' 8"  (1.727 m)  Wt 220 lb 6.4 oz (99.973 kg)  BMI 33.51 kg/m2 GENERAL:  Well appearing HEENT:  Pupils equal round and reactive, fundi not visualized, oral mucosa unremarkable NECK:  No jugular venous distention, waveform within normal limits, carotid upstroke brisk and symmetric, no bruits, no thyromegaly LYMPHATICS:  No cervical, inguinal adenopathy LUNGS:  Clear to auscultation bilaterally BACK:  No CVA tenderness CHEST:  Unremarkable HEART:  PMI not displaced or sustained,S1 and S2 within normal limits, no S3, no S4, no clicks, no  rubs, no murmurs ABD:  Flat, positive bowel sounds normal in frequency in pitch, no bruits, no rebound, no guarding, no midline pulsatile mass, no hepatomegaly, no splenomegaly EXT:  2 plus pulses throughout, no edema, no cyanosis no clubbing SKIN:  No rashes no nodules NEURO:  Cranial nerves II through XII grossly intact, motor grossly intact throughout PSYCH:  Cognitively intact, oriented to person place and time   EKG:  Sinus rhythm, rate 83, axis within normal limits, intervals within normal limits, no acute ST-T wave changes.  04/09/2012  ASSESSMENT AND PLAN

## 2012-04-10 ENCOUNTER — Institutional Professional Consult (permissible substitution): Payer: Managed Care, Other (non HMO) | Admitting: Cardiology

## 2012-05-10 ENCOUNTER — Encounter: Payer: Managed Care, Other (non HMO) | Admitting: Physician Assistant

## 2013-10-27 ENCOUNTER — Other Ambulatory Visit: Payer: Self-pay | Admitting: Physician Assistant

## 2013-12-03 ENCOUNTER — Other Ambulatory Visit: Payer: Self-pay | Admitting: Internal Medicine

## 2013-12-11 ENCOUNTER — Other Ambulatory Visit: Payer: Self-pay | Admitting: Internal Medicine

## 2013-12-18 DIAGNOSIS — E039 Hypothyroidism, unspecified: Secondary | ICD-10-CM | POA: Insufficient documentation

## 2013-12-18 DIAGNOSIS — D649 Anemia, unspecified: Secondary | ICD-10-CM | POA: Insufficient documentation

## 2013-12-18 DIAGNOSIS — E118 Type 2 diabetes mellitus with unspecified complications: Secondary | ICD-10-CM | POA: Insufficient documentation

## 2013-12-19 ENCOUNTER — Ambulatory Visit (INDEPENDENT_AMBULATORY_CARE_PROVIDER_SITE_OTHER): Payer: Managed Care, Other (non HMO) | Admitting: Physician Assistant

## 2013-12-19 ENCOUNTER — Encounter: Payer: Self-pay | Admitting: Physician Assistant

## 2013-12-19 VITALS — BP 138/64 | HR 88 | Temp 98.2°F | Resp 16 | Ht 67.0 in | Wt 233.0 lb

## 2013-12-19 DIAGNOSIS — Z79899 Other long term (current) drug therapy: Secondary | ICD-10-CM

## 2013-12-19 DIAGNOSIS — D649 Anemia, unspecified: Secondary | ICD-10-CM

## 2013-12-19 DIAGNOSIS — E782 Mixed hyperlipidemia: Secondary | ICD-10-CM

## 2013-12-19 DIAGNOSIS — E559 Vitamin D deficiency, unspecified: Secondary | ICD-10-CM

## 2013-12-19 DIAGNOSIS — I10 Essential (primary) hypertension: Secondary | ICD-10-CM

## 2013-12-19 DIAGNOSIS — R7303 Prediabetes: Secondary | ICD-10-CM

## 2013-12-19 DIAGNOSIS — R5381 Other malaise: Secondary | ICD-10-CM

## 2013-12-19 DIAGNOSIS — E785 Hyperlipidemia, unspecified: Secondary | ICD-10-CM

## 2013-12-19 DIAGNOSIS — R7309 Other abnormal glucose: Secondary | ICD-10-CM

## 2013-12-19 DIAGNOSIS — R5383 Other fatigue: Secondary | ICD-10-CM

## 2013-12-19 DIAGNOSIS — E039 Hypothyroidism, unspecified: Secondary | ICD-10-CM

## 2013-12-19 MED ORDER — PHENTERMINE HCL 37.5 MG PO TABS: 37.5000 mg | ORAL_TABLET | Freq: Every day | ORAL | Status: DC

## 2013-12-19 NOTE — Patient Instructions (Addendum)
We are starting you on Metformin to prevent or treat diabetes. Metformin does not cause low blood sugars. In order to create energy your cells need insulin and sugar but sometime your cells do not accept the insulin and this can cause increased sugars and decreased energy. The Metformin helps your cells accept insulin and the sugar to give you more energy.   The two most common side effects are nausea and diarrhea, follow these rules to avoid it! You can take imodium per box instructions when starting metformin if needed.   Rules of metformin: 1) start out slow with only one pill daily. Our goal for you is 4 pills a day or 2000mg  total.  2) take with your largest meal. 3) Take with least amount of carbs.   Call if you have any problems.     Bad carbs also include fruit juice, alcohol, and sweet tea. These are empty calories that do not signal to your brain that you are full.   Please remember the good carbs are still carbs which convert into sugar. So please measure them out no more than 1/2-1 cup of rice, oatmeal, pasta, and beans.  Veggies are however free foods! Pile them on.   I like lean protein at every meal such as chicken, [place], pork chops, cottage cheese, etc. Just do not fry these meats and please center your meal around vegetable, the meats should be a side dish.   No all fruit is created equal. Please see the list below, the fruit at the bottom is higher in sugars than the fruit at the top   Phentermine  While taking the medication we will ask that you come into the office once a month to monitor your weight, blood pressure, and heart rate. In addition we can help answer your questions about diet, exercise, and help you every step of the way with your weight loss journey. Sometime it is helpful if you bring in a food diary or use an app on your phone such as myfitnesspal to record your calorie intake, especially in the beginning. You can take 1/3 or 1/2 starting out once a day  and if you feel fine with it you can go up to one pill a day .  What is this medicine? PHENTERMINE (FEN ter meen) decreases your appetite. This medicine is intended to be used in addition to a healthy reduced calorie diet and exercise. The best results are achieved this way. This medicine is only indicated for short-term use. Eventually your weight loss may level out and the medication will no longer be needed.   How should I use this medicine? Take this medicine by mouth. Follow the directions on the prescription label. The tablets should stay in the bottle until immediately before you take your dose. Take your doses at regular intervals. Do not take your medicine more often than directed.  Overdosage: If you think you have taken too much of this medicine contact a poison control center or emergency room at once. NOTE: This medicine is only for you. Do not share this medicine with others.  What if I miss a dose? If you miss a dose, take it as soon as you can. If it is almost time for your next dose, take only that dose. Do not take double or extra doses. Do not increase or in any way change your dose without consulting your doctor.  What should I watch for while using this medicine? Notify your physician immediately if you  become short of breath while doing your normal activities. Do not take this medicine within 6 hours of bedtime. It can keep you from getting to sleep. Avoid drinks that contain caffeine and try to stick to a regular bedtime every night. Do not stand or sit up quickly, especially if you are an older patient. This reduces the risk of dizzy or fainting spells. Avoid alcoholic drinks.  What side effects may I notice from receiving this medicine? Side effects that you should report to your doctor or health care professional as soon as possible: -chest pain, palpitations -depression or severe changes in mood -increased blood pressure -irritability -nervousness or  restlessness -severe dizziness -shortness of breath -problems urinating -unusual swelling of the legs -vomiting  Side effects that usually do not require medical attention (report to your doctor or health care professional if they continue or are bothersome): -blurred vision or other eye problems -changes in sexual ability or desire -constipation or diarrhea -difficulty sleeping -dry mouth or unpleasant taste -headache -nausea This list may not describe all possible side effects. Call your doctor for medical advice about side effects. You may report side effects to FDA at 1-800-FDA-1088.

## 2013-12-19 NOTE — Progress Notes (Signed)
HPI Patient presents for 3 month follow up with hypertension, hyperlipidemia, prediabetes and vitamin D. Patient's blood pressure has been controlled at home, today their BP is BP: 138/64 mmHg  Patient denies chest pain, shortness of breath, dizziness.  Patient's cholesterol is diet controlled. In addition they are on Lipitor and denies myalgias. The cholesterol last visit was LDL 49, Trigs 322.  The patient has been working on diet and exercise for prediabetes, and denies changes in vision, polys, and paresthesias. A1C was 6.3, insulin 79.  Melissa started her on Metformin last time but she did not start it.  CKD with Cr 1.13, BUN 16 and GFR 51.  She has been increasing her water.  Anemia last iron was 41, last colon was 2012, she is on nexium.  Patient is on Vitamin D supplement.   Current Medications:  Current Outpatient Prescriptions on File Prior to Visit  Medication Sig Dispense Refill  . atorvastatin (LIPITOR) 40 MG tablet Take 40 mg by mouth daily.      Marland Kitchen b complex vitamins tablet Take 1 tablet by mouth daily.      Marland Kitchen diltiazem (TIAZAC) 240 MG 24 hr capsule Take 240 mg by mouth daily.      . furosemide (LASIX) 20 MG tablet Take 20 mg by mouth daily. Take 2 tabs in am      . levothyroxine (SYNTHROID, LEVOTHROID) 125 MCG tablet Take 125 mcg by mouth daily.      . Multiple Vitamin (MULTIVITAMIN) tablet Take 1 tablet by mouth daily.      Marland Kitchen NEXIUM 40 MG capsule TAKE 1 CAPSULE EVERY DAY  90 capsule  1  . nortriptyline (PAMELOR) 25 MG capsule Take 25 mg by mouth 2 (two) times daily.        No current facility-administered medications on file prior to visit.   Medical History:  Past Medical History  Diagnosis Date  . Varicose veins   . Obesity   . Fatigue   . Chest tightness   . Myalgia   . GERD (gastroesophageal reflux disease)   . Ulcer   . Hyperlipidemia   . Hypothyroid   . Anemia   . Prediabetes    Allergies:  Allergies  Allergen Reactions  . Levaquin [Levofloxacin In D5w]    . Macrobid [Nitrofurantoin Macrocrystal]   . Penicillins     REACTION: rash  . Procaine Hcl     REACTION: difficulty breathing  . Sulfonamide Derivatives     REACTION: hives  . Tetanus Toxoid     REACTION: arm swells    ROS Constitutional: Denies fever, chills, headaches, insomnia, fatigue, night sweats Eyes: Denies redness, blurred vision, diplopia, discharge, itchy, watery eyes.  ENT: Denies congestion, post nasal drip, sore throat, earache, dental pain, Tinnitus, Vertigo, Sinus pain, snoring.  Cardio: Denies chest pain, palpitations, irregular heartbeat, dyspnea, diaphoresis, orthopnea, PND, claudication, edema Respiratory: + SOB occ denies cough,  wheezing.  Gastrointestinal: Denies dysphagia, heartburn, AB pain/ cramps, N/V, diarrhea, constipation, hematemesis, melena, hematochezia,  hemorrhoids Genitourinary: Denies dysuria, frequency, urgency, nocturia, hesitancy, discharge, hematuria, flank pain Musculoskeletal: + LBP Denies myalgia, stiffness, pain, swelling and strain/sprain. Skin: Denies pruritis, rash, changing in skin lesion Neuro: Denies Weakness, tremor, incoordination, spasms, pain Psychiatric: Denies confusion, memory loss, sensory loss Endocrine: Denies change in weight, skin, hair change, nocturia Diabetic Polys, Denies visual blurring, hyper /hypo glycemic episodes, and paresthesia, Heme/Lymph: Denies Excessive bleeding, bruising, enlarged lymph nodes  Family history- Review and unchanged Social history- Review and unchanged Physical Exam: [employer]  Vitals:   12/19/13 1701  BP: 138/64  Pulse: 88  Temp: 98.2 F (36.8 C)  Resp: 16   Filed Weights   12/19/13 1701  Weight: 233 lb (105.688 kg)  Body mass index is 36.48 kg/(m^2).  General Appearance: Well nourished, in no apparent distress. Eyes: PERRLA, EOMs, conjunctiva no swelling or erythema Sinuses: No Frontal/maxillary tenderness ENT/Mouth: Ext aud canals clear, TMs without erythema, bulging. No  erythema, swelling, or exudate on post pharynx.  Tonsils not swollen or erythematous. Hearing normal.  Neck: Supple, thyroid normal.  Respiratory: Respiratory effort normal, BS equal bilaterally without rales, rhonchi, wheezing or stridor.  Cardio: RRR with no MRGs. Brisk peripheral pulses with 1-2+ edema.  Abdomen: Soft,  Obese, + BS.  Non tender, no guarding, rebound, hernias, masses. Lymphatics: Non tender without lymphadenopathy.  Musculoskeletal: Full ROM, 5/5 strength, normal gait. Lower back pain occ Skin: Warm, dry without rashes, lesions, ecchymosis.  Neuro: Cranial nerves intact. Normal muscle tone, no cerebellar symptoms. Sensation intact.  Psych: Awake and oriented X 3, normal affect, Insight and Judgment appropriate.   Assessment and Plan:  Hypertension: Continue medication, monitor blood pressure at home. Continue DASH diet. Cholesterol: Continue diet and exercise. Check cholesterol.  Pre-diabetes-Continue diet and exercise. Check A1C. Pending A1C/Insulin we will add metformin.  Vitamin D Def- check level and continue medications.  Anemia- check iron/ferritin- likely from nexium- get on iron and Vitamin C Smoking cessation- it has been 7 months since she quit and she is doing well.  Obesity with co morbidities- would like to try phenteramine- waist circumference 45 inches and BMI is 37 Lower back pain- RICE, heat, aleve with dinner is okay, Pt if she wants.   Follow up in one month Continue diet and meds as discussed. Further disposition pending results of labs. OVER 40 minutes of exam, counseling, chart review   Vicie Mutters 5:26 PM

## 2013-12-20 LAB — VITAMIN D 25 HYDROXY (VIT D DEFICIENCY, FRACTURES): Vit D, 25-Hydroxy: 75 ng/mL (ref 30–89)

## 2013-12-20 LAB — LIPID PANEL
CHOLESTEROL: 148 mg/dL (ref 0–200)
HDL: 43 mg/dL (ref >39)
LDL CALC: 57 mg/dL (ref 0–99)
Total CHOL/HDL Ratio: 3.4 Ratio
Triglycerides: 238 mg/dL — ABNORMAL HIGH (ref <150)
VLDL: 48 mg/dL — ABNORMAL HIGH (ref 0–40)

## 2013-12-20 LAB — CBC WITH DIFFERENTIAL/PLATELET
BASOS PCT: 0 % (ref 0–1)
Basophils Absolute: 0.1 10*3/uL (ref 0.0–0.1)
EOS ABS: 0.3 10*3/uL (ref 0.0–0.7)
Eosinophils Relative: 2 % (ref 0–5)
HEMATOCRIT: 38.7 % (ref 36.0–46.0)
HEMOGLOBIN: 13 g/dL (ref 12.0–15.0)
Lymphocytes Relative: 29 % (ref 12–46)
Lymphs Abs: 3.9 10*3/uL (ref 0.7–4.0)
MCH: 27.4 pg (ref 26.0–34.0)
MCHC: 33.6 g/dL (ref 30.0–36.0)
MCV: 81.5 fL (ref 78.0–100.0)
MONO ABS: 0.9 10*3/uL (ref 0.1–1.0)
MONOS PCT: 7 % (ref 3–12)
Neutro Abs: 8.5 10*3/uL — ABNORMAL HIGH (ref 1.7–7.7)
Neutrophils Relative %: 62 % (ref 43–77)
Platelets: 341 10*3/uL (ref 150–400)
RBC: 4.75 MIL/uL (ref 3.87–5.11)
RDW: 15.8 % — ABNORMAL HIGH (ref 11.5–15.5)
WBC: 13.7 10*3/uL — ABNORMAL HIGH (ref 4.0–10.5)

## 2013-12-20 LAB — FERRITIN: Ferritin: 18 ng/mL (ref 10–291)

## 2013-12-20 LAB — HEPATIC FUNCTION PANEL
ALT: 28 U/L (ref 0–35)
AST: 26 U/L (ref 0–37)
Albumin: 4.1 g/dL (ref 3.5–5.2)
Alkaline Phosphatase: 114 U/L (ref 39–117)
BILIRUBIN INDIRECT: 0.3 mg/dL (ref 0.2–1.2)
Bilirubin, Direct: 0.1 mg/dL (ref 0.0–0.3)
Total Bilirubin: 0.4 mg/dL (ref 0.2–1.2)
Total Protein: 7 g/dL (ref 6.0–8.3)

## 2013-12-20 LAB — INSULIN, FASTING: INSULIN FASTING, SERUM: 63 u[IU]/mL — AB (ref 3–28)

## 2013-12-20 LAB — BASIC METABOLIC PANEL WITH GFR
BUN: 15 mg/dL (ref 6–23)
CO2: 24 meq/L (ref 19–32)
Calcium: 9.1 mg/dL (ref 8.4–10.5)
Chloride: 105 mEq/L (ref 96–112)
Creat: 1.09 mg/dL (ref 0.50–1.10)
GFR, EST AFRICAN AMERICAN: 62 mL/min
GFR, Est Non African American: 53 mL/min — ABNORMAL LOW
GLUCOSE: 103 mg/dL — AB (ref 70–99)
Potassium: 3.6 mEq/L (ref 3.5–5.3)
Sodium: 140 mEq/L (ref 135–145)

## 2013-12-20 LAB — HEMOGLOBIN A1C
HEMOGLOBIN A1C: 6.4 % — AB (ref <5.7)
MEAN PLASMA GLUCOSE: 137 mg/dL — AB (ref <117)

## 2013-12-20 LAB — MAGNESIUM: Magnesium: 2 mg/dL (ref 1.5–2.5)

## 2013-12-20 LAB — IRON AND TIBC
%SAT: 10 % — AB (ref 20–55)
Iron: 33 ug/dL — ABNORMAL LOW (ref 42–145)
TIBC: 344 ug/dL (ref 250–470)
UIBC: 311 ug/dL (ref 125–400)

## 2013-12-20 LAB — VITAMIN B12: Vitamin B-12: 490 pg/mL (ref 211–911)

## 2013-12-20 LAB — TSH: TSH: 3.831 u[IU]/mL (ref 0.350–4.500)

## 2014-01-20 ENCOUNTER — Ambulatory Visit: Payer: Self-pay | Admitting: Physician Assistant

## 2014-01-29 ENCOUNTER — Other Ambulatory Visit: Payer: Self-pay | Admitting: Internal Medicine

## 2014-01-29 MED ORDER — PREGABALIN 75 MG PO CAPS: 75.0000 mg | ORAL_CAPSULE | Freq: Three times a day (TID) | ORAL | Status: DC

## 2014-02-10 ENCOUNTER — Ambulatory Visit: Payer: Self-pay | Admitting: Emergency Medicine

## 2014-02-13 ENCOUNTER — Ambulatory Visit (INDEPENDENT_AMBULATORY_CARE_PROVIDER_SITE_OTHER): Payer: Managed Care, Other (non HMO) | Admitting: Physician Assistant

## 2014-02-13 ENCOUNTER — Encounter: Payer: Self-pay | Admitting: Physician Assistant

## 2014-02-13 MED ORDER — PHENTERMINE HCL 37.5 MG PO TABS: 37.5000 mg | ORAL_TABLET | Freq: Every day | ORAL | Status: DC

## 2014-02-13 NOTE — Patient Instructions (Signed)
[email or web address] check out Diamantina Monks    We want weight loss that will last so you should lose 1-2 pounds a week.  THAT IS IT! Please pick THREE things a month to change. Once it is a habit check off the item. Then pick another three items off the list to become habits.  If you are already doing a habit on the list GREAT!  Cross that item off! o Don't drink your calories. Ie, alcohol, soda, fruit juice, and sweet tea.  o Drink more water. Drink a glass when you feel hungry or before each meal.  o Eat breakfast - Complex carb and protein (likeDannon light and fit yogurt, oatmeal, fruit, eggs, [place] bacon). o Measure your cereal.  Eat no more than one cup a day. (ie [place]) o Eat an apple a day. o Add a vegetable a day. o Try a new vegetable a month. o Use Pam! Stop using oil or butter to cook. o Don't finish your plate or use smaller plates. o Share your dessert. o Eat sugar free Jello for dessert or frozen grapes. o Don't eat 2-3 hours before bed. o Switch to whole wheat bread, pasta, and brown rice. o Make healthier choices when you eat out. No fries! o Pick baked chicken, NOT fried. o Don't forget to SLOW DOWN when you eat. It is not going anywhere.  o Take the stairs. o Park far away in the parking lot o [employer] (or weights) for 10 minutes while watching TV. o Walk at work for 10 minutes during break. o Walk outside 1 time a week with your friend, kids, dog, or significant other. o Start a walking group at [facility] the mall as much as you can tolerate.  o Keep a food diary. o Weigh yourself daily. o Walk for 15 minutes 3 days per week. o Cook at home more often and eat out less.  If life happens and you go back to old habits, it is okay.  Just start over. You can do it!   If you experience chest pain, get short of breath, or tired during the exercise, please stop immediately and inform your doctor.

## 2014-02-13 NOTE — Progress Notes (Signed)
68 y.o.female presents for a follow up after being on phentermine for weight loss for 1 months. Patient states they have improved meal pattern, more consistent meal timing, fewer sweetened foods & beverages, better food choices and adequate fluid intake (at least 6 cups of fluid per day). While on the phentermine they have lost 6 lbs since last visit. They deny palpitations, anxiety, trouble sleeping, elevated BP.   Wt Readings from Last 3 Encounters:  02/13/14 227 lb (102.967 kg)  12/19/13 233 lb (105.688 kg)  04/09/12 220 lb 6.4 oz (99.973 kg)    Typical breakfast: scrambled eggs, sausage Typical lunch:  Half a sandwich- from cafeteria at work Typical dinner:bowl of cereal, eggs, frozen dinner  Medications: Current Outpatient Prescriptions on File Prior to Visit  Medication Sig Dispense Refill  . aspirin 81 MG tablet Take 81 mg by mouth daily.      Marland Kitchen atorvastatin (LIPITOR) 40 MG tablet Take 40 mg by mouth daily.      Marland Kitchen b complex vitamins tablet Take 1 tablet by mouth daily.      Marland Kitchen CALCIUM PO Take 1,200 mg by mouth daily.       Marland Kitchen diltiazem (TIAZAC) 240 MG 24 hr capsule Take 240 mg by mouth daily.      Marland Kitchen estradiol (ESTRACE) 1 MG tablet Take 1 mg by mouth daily.      . furosemide (LASIX) 20 MG tablet Take 20 mg by mouth daily. Take 2 tabs in am      . levothyroxine (SYNTHROID, LEVOTHROID) 125 MCG tablet Take 125 mcg by mouth daily.      . meloxicam (MOBIC) 7.5 MG tablet Take 7.5 mg by mouth 2 (two) times daily.      . Multiple Vitamin (MULTIVITAMIN) tablet Take 1 tablet by mouth daily.      . Multiple Vitamins-Minerals (MULTIVITAMIN PO) Take by mouth.      Marland Kitchen NEXIUM 40 MG capsule TAKE 1 CAPSULE EVERY DAY  90 capsule  1  . nortriptyline (PAMELOR) 25 MG capsule Take 25 mg by mouth 2 (two) times daily.       . phentermine (ADIPEX-P) 37.5 MG tablet Take 1 tablet (37.5 mg total) by mouth daily before breakfast.  30 tablet  0  . pregabalin (LYRICA) 75 MG capsule Take 1 capsule (75 mg total) by  mouth 3 (three) times daily.  270 capsule  1   No current facility-administered medications on file prior to visit.    ROS: All negative except for above  Physical exam: Filed Vitals:   02/13/14 1639  BP: 102/60  Pulse: 68  Temp: 97.9 F (36.6 C)  Resp: 16   BP 102/60  Pulse 68  Temp(Src) 97.9 F (36.6 C)  Resp 16  Wt 227 lb (102.967 kg) General appearance: alert, no distress and morbidly obese Lungs: clear to auscultation bilaterally Heart: regular rate and rhythm, S1, S2 normal, no murmur, click, rub or gallop Abdomen: soft, non-tender; bowel sounds normal; no masses,  no organomegaly Extremities: extremities normal, atraumatic, no cyanosis or edema Skin: Skin color, texture, turgor normal. No rashes or lesions Neurologic: Grossly normal  Assessment: Obesity with co morbid conditions.   Plan: General weight loss/lifestyle modification strategies discussed (elicit support from others; identify saboteurs; non-food rewards, etc). Diet interventions: diet diary indefinitely. Informal exercise measures discussed, e.g. taking stairs instead of elevator. Regular aerobic exercise program discussed. Medication: phentermine. Follow up in: 1 month and as needed.

## 2014-03-05 ENCOUNTER — Other Ambulatory Visit: Payer: Self-pay | Admitting: Internal Medicine

## 2014-03-05 ENCOUNTER — Other Ambulatory Visit: Payer: Self-pay | Admitting: Physician Assistant

## 2014-03-20 ENCOUNTER — Ambulatory Visit: Payer: Self-pay | Admitting: Physician Assistant

## 2014-04-01 ENCOUNTER — Ambulatory Visit: Payer: Self-pay | Admitting: Physician Assistant

## 2014-04-07 ENCOUNTER — Other Ambulatory Visit: Payer: Self-pay | Admitting: Internal Medicine

## 2014-04-10 ENCOUNTER — Encounter: Payer: Self-pay | Admitting: Physician Assistant

## 2014-04-10 ENCOUNTER — Ambulatory Visit (INDEPENDENT_AMBULATORY_CARE_PROVIDER_SITE_OTHER): Payer: Managed Care, Other (non HMO) | Admitting: Physician Assistant

## 2014-04-10 VITALS — BP 138/70 | HR 80 | Temp 97.9°F | Resp 16 | Wt 226.0 lb

## 2014-04-10 DIAGNOSIS — Z79899 Other long term (current) drug therapy: Secondary | ICD-10-CM

## 2014-04-10 DIAGNOSIS — E039 Hypothyroidism, unspecified: Secondary | ICD-10-CM

## 2014-04-10 DIAGNOSIS — E559 Vitamin D deficiency, unspecified: Secondary | ICD-10-CM

## 2014-04-10 DIAGNOSIS — R7303 Prediabetes: Secondary | ICD-10-CM

## 2014-04-10 DIAGNOSIS — D649 Anemia, unspecified: Secondary | ICD-10-CM

## 2014-04-10 DIAGNOSIS — R7309 Other abnormal glucose: Secondary | ICD-10-CM

## 2014-04-10 DIAGNOSIS — E785 Hyperlipidemia, unspecified: Secondary | ICD-10-CM

## 2014-04-10 MED ORDER — PHENTERMINE HCL 37.5 MG PO TABS: 37.5000 mg | ORAL_TABLET | Freq: Every day | ORAL | Status: DC

## 2014-04-10 MED ORDER — ATORVASTATIN CALCIUM 40 MG PO TABS: ORAL_TABLET | ORAL | Status: DC

## 2014-04-10 NOTE — Progress Notes (Signed)
68 y.o.female presents for a follow up after being on phentermine for weight loss for 2 months. Patient states they have better variety, improved meal pattern, increased physical activity and improved protein intake however she has had increased stress at work and has been eating more sweets. She also ran out of the phentermine for 1 month.  While on the phentermine they have lost 7 lbs since last visit. They deny palpitations, anxiety, trouble sleeping, elevated BP.   Wt Readings from Last 3 Encounters:  04/10/14 226 lb (102.513 kg)  02/13/14 227 lb (102.967 kg)  12/19/13 233 lb (105.688 kg)    Medications: Current Outpatient Prescriptions on File Prior to Visit  Medication Sig Dispense Refill  . aspirin 81 MG tablet Take 81 mg by mouth daily.      Marland Kitchen b complex vitamins tablet Take 1 tablet by mouth daily.      Marland Kitchen CALCIUM PO Take 1,200 mg by mouth daily.       Marland Kitchen diltiazem (TIAZAC) 240 MG 24 hr capsule Take 240 mg by mouth daily.      Marland Kitchen estradiol (ESTRACE) 0.5 MG tablet TAKE 1 TABLET EVERY DAY  90 tablet  3  . furosemide (LASIX) 20 MG tablet TAKE 1 TABLET TWICE A DAY  180 tablet  3  . levothyroxine (SYNTHROID, LEVOTHROID) 125 MCG tablet Take 125 mcg by mouth daily.      . meloxicam (MOBIC) 7.5 MG tablet Take 7.5 mg by mouth 2 (two) times daily.      . Multiple Vitamin (MULTIVITAMIN) tablet Take 1 tablet by mouth daily.      Marland Kitchen NEXIUM 40 MG capsule TAKE 1 CAPSULE EVERY DAY  90 capsule  1  . nortriptyline (PAMELOR) 25 MG capsule Take 25 mg by mouth 2 (two) times daily.       . pregabalin (LYRICA) 75 MG capsule Take 1 capsule (75 mg total) by mouth 3 (three) times daily.  270 capsule  1   No current facility-administered medications on file prior to visit.    ROS: All negative except for above  Physical exam: Filed Vitals:   04/10/14 1608  BP: 138/70  Pulse: 80  Temp: 97.9 F (36.6 C)  Resp: 16   BP 138/70  Pulse 80  Temp(Src) 97.9 F (36.6 C)  Resp 16  Wt 226 lb (102.513  kg) General appearance: alert Lungs: clear to auscultation bilaterally Heart: regular rate and rhythm, S1, S2 normal, no murmur, click, rub or gallop Abdomen: soft, non-tender; bowel sounds normal; no masses,  no organomegaly  Assessment: Obesity with co morbid conditions.   Plan: General weight loss/lifestyle modification strategies discussed (elicit support from others; identify saboteurs; non-food rewards, etc). Informal exercise measures discussed, e.g. taking stairs instead of elevator. Regular aerobic exercise program discussed. Medication: phentermine. Follow up in: 1 month and as needed.

## 2014-04-10 NOTE — Patient Instructions (Signed)
Phentermine  While taking the medication we will ask that you come into the office once a month to monitor your weight, blood pressure, and heart rate. In addition we can help answer your questions about diet, exercise, and help you every step of the way with your weight loss journey. Sometime it is helpful if you bring in a food diary or use an app on your phone such as myfitnesspal to record your calorie intake, especially in the beginning.   You can start out on 1/3 to 1/2 a pill in the morning and if you are tolerating it well you can increase to one pill daily.   What is this medicine? PHENTERMINE (FEN ter meen) decreases your appetite. This medicine is intended to be used in addition to a healthy reduced calorie diet and exercise. The best results are achieved this way. This medicine is only indicated for short-term use. Eventually your weight loss may level out and the medication will no longer be needed.   How should I use this medicine? Take this medicine by mouth. Follow the directions on the prescription label. The tablets should stay in the bottle until immediately before you take your dose. Take your doses at regular intervals. Do not take your medicine more often than directed.  Overdosage: If you think you have taken too much of this medicine contact a poison control center or emergency room at once. NOTE: This medicine is only for you. Do not share this medicine with others.  What if I miss a dose? If you miss a dose, take it as soon as you can. If it is almost time for your next dose, take only that dose. Do not take double or extra doses. Do not increase or in any way change your dose without consulting your doctor.  What should I watch for while using this medicine? Notify your physician immediately if you become short of breath while doing your normal activities. Do not take this medicine within 6 hours of bedtime. It can keep you from getting to sleep. Avoid drinks that contain  caffeine and try to stick to a regular bedtime every night. Do not stand or sit up quickly, especially if you are an older patient. This reduces the risk of dizzy or fainting spells. Avoid alcoholic drinks.  What side effects may I notice from receiving this medicine? Side effects that you should report to your doctor or health care professional as soon as possible: -chest pain, palpitations -depression or severe changes in mood -increased blood pressure -irritability -nervousness or restlessness -severe dizziness -shortness of breath -problems urinating -unusual swelling of the legs -vomiting  Side effects that usually do not require medical attention (report to your doctor or health care professional if they continue or are bothersome): -blurred vision or other eye problems -changes in sexual ability or desire -constipation or diarrhea -difficulty sleeping -dry mouth or unpleasant taste -headache -nausea This list may not describe all possible side effects. Call your doctor for medical advice about side effects. You may report side effects to FDA at 1-800-FDA-1088.    Bad carbs also include fruit juice, alcohol, and sweet tea. These are empty calories that do not signal to your brain that you are full.   Please remember the good carbs are still carbs which convert into sugar. So please measure them out no more than 1/2-1 cup of rice, oatmeal, pasta, and beans.  Veggies are however free foods! Pile them on.   I like lean protein at  every meal such as chicken, [place], pork chops, cottage cheese, etc. Just do not fry these meats and please center your meal around vegetable, the meats should be a side dish.   No all fruit is created equal. Please see the list below, the fruit at the bottom is higher in sugars than the fruit at the top

## 2014-05-01 ENCOUNTER — Other Ambulatory Visit: Payer: Self-pay | Admitting: Emergency Medicine

## 2014-05-12 ENCOUNTER — Encounter: Payer: Self-pay | Admitting: Physician Assistant

## 2014-05-12 ENCOUNTER — Ambulatory Visit (INDEPENDENT_AMBULATORY_CARE_PROVIDER_SITE_OTHER): Payer: Managed Care, Other (non HMO) | Admitting: Physician Assistant

## 2014-05-12 VITALS — BP 128/68 | HR 84 | Temp 98.1°F | Resp 16 | Ht 67.0 in | Wt 229.0 lb

## 2014-05-12 DIAGNOSIS — E559 Vitamin D deficiency, unspecified: Secondary | ICD-10-CM

## 2014-05-12 DIAGNOSIS — E785 Hyperlipidemia, unspecified: Secondary | ICD-10-CM

## 2014-05-12 DIAGNOSIS — R7303 Prediabetes: Secondary | ICD-10-CM

## 2014-05-12 DIAGNOSIS — F17200 Nicotine dependence, unspecified, uncomplicated: Secondary | ICD-10-CM

## 2014-05-12 DIAGNOSIS — Z23 Encounter for immunization: Secondary | ICD-10-CM

## 2014-05-12 DIAGNOSIS — E039 Hypothyroidism, unspecified: Secondary | ICD-10-CM

## 2014-05-12 DIAGNOSIS — Z79899 Other long term (current) drug therapy: Secondary | ICD-10-CM

## 2014-05-12 DIAGNOSIS — Z0000 Encounter for general adult medical examination without abnormal findings: Secondary | ICD-10-CM

## 2014-05-12 DIAGNOSIS — Z720 Tobacco use: Secondary | ICD-10-CM

## 2014-05-12 DIAGNOSIS — R7309 Other abnormal glucose: Secondary | ICD-10-CM

## 2014-05-12 LAB — CBC WITH DIFFERENTIAL/PLATELET
BASOS ABS: 0.1 10*3/uL (ref 0.0–0.1)
Basophils Relative: 1 % (ref 0–1)
Eosinophils Absolute: 0.2 10*3/uL (ref 0.0–0.7)
Eosinophils Relative: 2 % (ref 0–5)
HCT: 37.8 % (ref 36.0–46.0)
Hemoglobin: 12.6 g/dL (ref 12.0–15.0)
LYMPHS PCT: 38 % (ref 12–46)
Lymphs Abs: 4.1 10*3/uL — ABNORMAL HIGH (ref 0.7–4.0)
MCH: 26.4 pg (ref 26.0–34.0)
MCHC: 33.3 g/dL (ref 30.0–36.0)
MCV: 79.1 fL (ref 78.0–100.0)
Monocytes Absolute: 0.7 10*3/uL (ref 0.1–1.0)
Monocytes Relative: 6 % (ref 3–12)
NEUTROS ABS: 5.8 10*3/uL (ref 1.7–7.7)
NEUTROS PCT: 53 % (ref 43–77)
PLATELETS: 300 10*3/uL (ref 150–400)
RBC: 4.78 MIL/uL (ref 3.87–5.11)
RDW: 15.2 % (ref 11.5–15.5)
WBC: 10.9 10*3/uL — AB (ref 4.0–10.5)

## 2014-05-12 LAB — HEMOGLOBIN A1C
HEMOGLOBIN A1C: 6.3 % — AB (ref <5.7)
MEAN PLASMA GLUCOSE: 134 mg/dL — AB (ref <117)

## 2014-05-12 NOTE — Progress Notes (Signed)
Complete Physical  Assessment and Plan: Varicose veins- weight loss advised, compression stockings, walking  Obesity- can try the phentermine 1/2 of a pill, and continue weight loss  GERD (gastroesophageal reflux disease)-controlled  Ulcer-check for anemia  Hyperlipidemia--continue medications, check lipids, decrease fatty foods, increase activity.   Hypothyroidism-check TSH level, continue medications the same.   Anemia-check CBC  Prediabetes-Discussed general issues about diabetes pathophysiology and management., Educational material distributed., Suggested low cholesterol diet., Encouraged aerobic exercise., Discussed foot care., Reminded to get yearly retinal exam.  History of smoking- quit July 2014- will repeat CXR Prevnar- given today IBS- try benefiber/ avoid stress at job Check with insurance about Shingles  Discussed med's effects and SE's. Screening labs and tests as requested with regular follow-up as recommended.  HPI 68 y.o. female  presents for a complete physical. Her blood pressure has been controlled at home, today their BP is BP: 128/68 mmHg She does not workout. She denies chest pain, shortness of breath, dizziness.  She is on cholesterol medication and denies myalgias. Her cholesterol is at goal. The cholesterol last visit was:   Lab Results  Component Value Date   CHOL 148 12/19/2013   HDL 43 12/19/2013   LDLCALC 57 12/19/2013   TRIG 238* 12/19/2013   CHOLHDL 3.4 12/19/2013   She has been working on diet and exercise for prediabetes, and denies polydipsia and polyuria. She has bilateral paraesthsias and takes lyrica/nortiptylene. Last A1C in the office was:  Lab Results  Component Value Date   HGBA1C 6.4* 12/19/2013   Patient is on Vitamin D supplement.   She is on thyroid medication. Her medication was not changed last visit. Patient denies nervousness and weight changes.  Lab Results  Component Value Date   TSH 3.831 12/19/2013  .  She stopped the  phentermine due to palpitations. It has not happened again since stopping the medications.  She states she goes back and forth between diarrhea and constipation.  She is on estrogen and bASA which she states is very helpful.   Current Medications:  Current Outpatient Prescriptions on File Prior to Visit  Medication Sig Dispense Refill  . aspirin 81 MG tablet Take 81 mg by mouth daily.      Marland Kitchen atorvastatin (LIPITOR) 40 MG tablet TAKE 1 TABLET AT BEDTIME   FOR CHOLESTEROL  90 tablet  1  . b complex vitamins tablet Take 1 tablet by mouth daily.      Marland Kitchen CALCIUM PO Take 1,200 mg by mouth daily.       Marland Kitchen diltiazem (TIAZAC) 240 MG 24 hr capsule Take 240 mg by mouth daily.      Marland Kitchen esomeprazole (NEXIUM) 40 MG capsule TAKE 1 CAPSULE DAILY  90 capsule  1  . estradiol (ESTRACE) 0.5 MG tablet TAKE 1 TABLET EVERY DAY  90 tablet  3  . furosemide (LASIX) 20 MG tablet TAKE 1 TABLET TWICE A DAY  180 tablet  3  . levothyroxine (SYNTHROID, LEVOTHROID) 125 MCG tablet Take 125 mcg by mouth daily.      . meloxicam (MOBIC) 7.5 MG tablet Take 7.5 mg by mouth 2 (two) times daily.      . Multiple Vitamin (MULTIVITAMIN) tablet Take 1 tablet by mouth daily.      . nortriptyline (PAMELOR) 25 MG capsule Take 25 mg by mouth 2 (two) times daily.       . phentermine (ADIPEX-P) 37.5 MG tablet Take 1 tablet (37.5 mg total) by mouth daily before breakfast.  30 tablet  0  . pregabalin (LYRICA) 75 MG capsule Take 1 capsule (75 mg total) by mouth 3 (three) times daily.  270 capsule  1   No current facility-administered medications on file prior to visit.   Health Maintenance:   Immunization History  Administered Date(s) Administered  . Pneumococcal Polysaccharide-23 09/05/2007   Tetanus: ALLERGY Pneumovax: 2008 Prevnar DUE TODAY Flu vaccine: 2014 yearly Zostavax: N/A Pap: 02/2011 normal MGM: 04/2014 normal at [facility] DEXA: 07/2012 normal Colonoscopy: 2011  Due 2021 EGD: n/a  Patient Care Team: Unk Pinto, MD as PCP  - General (Internal Medicine) Lafayette Dragon, MD as Consulting Physician (Gastroenterology) Darleen Crocker, MD as Consulting Physician (Ophthalmology) Macarthur Critchley, [place] as Referring Physician (Optometry) [facility] Myrtice Lauth, MD as Consulting Physician (Dermatology) Ninetta Lights, MD as Consulting Physician (Orthopedic Surgery) Minus Breeding, MD as Consulting Physician (Cardiology)   Allergies:  Allergies  Allergen Reactions  . Levaquin [Levofloxacin In D5w]   . Macrobid [Nitrofurantoin Macrocrystal]   . Penicillins     REACTION: rash  . Procaine Hcl     REACTION: difficulty breathing  . Sulfonamide Derivatives     REACTION: hives  . Tetanus Toxoid     REACTION: arm swells   Medical History:  Past Medical History  Diagnosis Date  . Varicose veins   . Obesity   . Fatigue   . Chest tightness   . Myalgia   . GERD (gastroesophageal reflux disease)   . Ulcer   . Hyperlipidemia   . Hypothyroid   . Anemia   . Prediabetes    Surgical History:  Past Surgical History  Procedure Laterality Date  . Abdominal hysterectomy    . Tonsillectomy and adenoidectomy    . Cataract extraction    . Nasal sinus surgery     Family History:  Family History  Problem Relation Age of Onset  . Coronary artery disease Father 66   Social History:  History  Substance Use Topics  . Smoking status: Former Smoker -- 0.50 packs/day for 46 years    Types: Cigarettes    Quit date: 05/27/2013  . Smokeless tobacco: Not on file  . Alcohol Use: Not on file    Review of Systems: [X]  = complains of  [ ]  = denies  General: Fatigue [ ]  Fever [ ]  Chills [ ]  Weakness [ ]   Insomnia [ ] Weight change [ ]  Night sweats [ ]   Change in appetite [ ]  Eyes: Redness [ ]  Blurred vision [ ]  Diplopia [ ]  Discharge [ ]   ENT: Congestion [ ]  Sinus Pain [ ]  Post Nasal Drip [ ]  Sore Throat [ ]  Earache [ ]  hearing loss [ ]  Tinnitus [ ]  Snoring [ ]   Cardiac: Chest pain/pressure [ ]  SOB [ ]  Orthopnea [ ]   Palpitations [ ]    Paroxysmal nocturnal dyspnea[ ]  Claudication [ ]  Edema [ ]   Pulmonary: Cough [ ]  Wheezing[ ]   SOB [ ]   Pleurisy [ ]   GI: Nausea [ ]  Vomiting[ ]  Dysphagia[ ]  Heartburn[ ]  Abdominal pain [ ]  Constipation [ X]; Diarrhea Valu.Nieves ] BRBPR [ ]  Melena[ ]  Bloating [ ]  Hemorrhoids [ ]   GU: Hematuria[ ]  Dysuria [ ]  Nocturia[ ]  Urgency [ ]   Hesitancy [ ]  Discharge [ ]  Frequency [ ]   Breast:  Breast lumps [ ]   nipple discharge [ ]    Neuro: Headaches[ ]  Vertigo[ ]  Paresthesias[ ]  Spasm [ ]  Speech changes [ ]  Incoordination [ ]   Ortho: Arthritis [ ]  Joint pain [ ]   Muscle pain [ ]  Joint swelling [ ]  Back Pain [ ]  Skin:  Rash [ ]   Pruritis [ ]  Change in skin lesion [ ]   Psych: Depression[ ]  Anxiety[ ]  Confusion [ ]  Memory loss [ ]   Heme/Lypmh: Bleeding [ ]  Bruising [ ]  Enlarged lymph nodes [ ]   Endocrine: Visual blurring [ ]  Paresthesia [ ]  Polyuria [ ]  Polydypsea [ ]    Heat/cold intolerance [ ]  Hypoglycemia [ ]   Physical Exam: Estimated body mass index is 35.86 kg/(m^2) as calculated from the following:   Height as of this encounter: 5\' 7"  (1.702 m).   Weight as of this encounter: 229 lb (103.874 kg). BP 128/68  Pulse 84  Temp(Src) 98.1 F (36.7 C)  Resp 16  Ht 5\' 7"  (1.702 m)  Wt 229 lb (103.874 kg)  BMI 35.86 kg/m2 General Appearance: Well nourished, in no apparent distress. Eyes: PERRLA, EOMs, conjunctiva no swelling or erythema, normal fundi and vessels. Sinuses: No Frontal/maxillary tenderness ENT/Mouth: Ext aud canals clear, normal light reflex with TMs without erythema, bulging.  Good dentition. No erythema, swelling, or exudate on post pharynx. Tonsils not swollen or erythematous. Hearing normal.  Neck: Supple, thyroid normal. No bruits Respiratory: Respiratory effort normal, BS equal bilaterally without rales, rhonchi, wheezing or stridor. Cardio: RRR without murmurs, rubs or gallops. Brisk peripheral pulses without edema.  Chest: symmetric, with normal excursions and percussion. Breasts:  Symmetric, without lumps, nipple discharge, retractions. Abdomen: Soft, +BS. Non tender, no guarding, rebound, hernias, masses, or organomegaly. .  Lymphatics: Non tender without lymphadenopathy.  Genitourinary: defer Musculoskeletal: Full ROM all peripheral extremities,5/5 strength, and normal gait. Skin: Warm, dry without rashes, lesions, ecchymosis.  Neuro: Cranial nerves intact, reflexes equal bilaterally. Normal muscle tone, no cerebellar symptoms. Sensation intact.  Psych: Awake and oriented X 3, normal affect, Insight and Judgment appropriate.   EKG: WNL no changes. AORTA SCAN: WNL    Vicie Mutters 2:23 PM

## 2014-05-12 NOTE — Patient Instructions (Addendum)
Benefiber is good for constipation/diarrhea/irritable bowel syndrome, it helps with weight loss and can help lower your bad cholesterol. Please do 1-2 TBSP in the morning in water, coffee, or tea. It can take up to a month before you can see a difference with your bowel movements. It is cheapest from [employer], [employer], [employer].   Preventative Care for Adults - Female      MAINTAIN REGULAR HEALTH EXAMS:  A routine yearly physical is a good way to check in with your primary care provider about your health and preventive screening. It is also an opportunity to share updates about your health and any concerns you have, and receive a thorough all-over exam.   Most health insurance companies pay for at least some preventative services.  Check with your health plan for specific coverages.  WHAT PREVENTATIVE SERVICES DO WOMEN NEED?  Adult women should have their weight and blood pressure checked regularly.   Women age 70 and older should have their cholesterol levels checked regularly.  Women should be screened for cervical cancer with a Pap smear and pelvic exam beginning at either age 61, or 3 years after they become sexually activity.    Breast cancer screening generally begins at age 8 with a mammogram and breast exam by your primary care provider.    Beginning at age 75 and continuing to age 29, women should be screened for colorectal cancer.  Certain people may need continued testing until age 18.  Updating vaccinations is part of preventative care.  Vaccinations help protect against diseases such as the flu.  Osteoporosis is a disease in which the bones lose minerals and strength as we age. Women ages 24 and over should discuss this with their caregivers, as should women after menopause who have other risk factors.  Lab tests are generally done as part of preventative care to screen for anemia and blood disorders, to screen for problems with the kidneys and liver, to screen for bladder  problems, to check blood sugar, and to check your cholesterol level.  Preventative services generally include counseling about diet, exercise, avoiding tobacco, drugs, excessive alcohol consumption, and sexually transmitted infections.    GENERAL RECOMMENDATIONS FOR GOOD HEALTH:  Healthy diet:  Eat a variety of foods, including fruit, vegetables, animal or vegetable protein, such as meat, fish, chicken, and eggs, or beans, lentils, tofu, and grains, such as rice.  Drink plenty of water daily.  Decrease saturated fat in the diet, avoid lots of red meat, processed foods, sweets, fast foods, and fried foods.  Exercise:  Aerobic exercise helps maintain good heart health. At least 30-40 minutes of moderate-intensity exercise is recommended. For example, a brisk walk that increases your heart rate and breathing. This should be done on most days of the week.   Find a type of exercise or a variety of exercises that you enjoy so that it becomes a part of your daily life.  Examples are running, walking, swimming, water aerobics, and biking.  For motivation and support, explore group exercise such as aerobic class, spin class, Zumba, Yoga,or  martial arts, etc.    Set exercise goals for yourself, such as a certain weight goal, walk or run in a race such as a 5k walk/run.  Speak to your primary care provider about exercise goals.  Disease prevention:  If you smoke or chew tobacco, find out from your caregiver how to quit. It can literally save your life, no matter how long you have been a tobacco user. If  you do not use tobacco, never begin.   Maintain a healthy diet and normal weight. Increased weight leads to problems with blood pressure and diabetes.   The Body Mass Index or BMI is a way of measuring how much of your body is fat. Having a BMI above 27 increases the risk of heart disease, diabetes, hypertension, stroke and other problems related to obesity. Your caregiver can help determine your  BMI and based on it develop an exercise and dietary program to help you achieve or maintain this important measurement at a healthful level.  High blood pressure causes heart and blood vessel problems.  Persistent high blood pressure should be treated with medicine if weight loss and exercise do not work.   Fat and cholesterol leaves deposits in your arteries that can block them. This causes heart disease and vessel disease elsewhere in your body.  If your cholesterol is found to be high, or if you have heart disease or certain other medical conditions, then you may need to have your cholesterol monitored frequently and be treated with medication.   Ask if you should have a cardiac stress test if your history suggests this. A stress test is a test done on a treadmill that looks for heart disease. This test can find disease prior to there being a problem.  Menopause can be associated with physical symptoms and risks. Hormone replacement therapy is available to decrease these. You should talk to your caregiver about whether starting or continuing to take hormones is right for you.   Osteoporosis is a disease in which the bones lose minerals and strength as we age. This can result in serious bone fractures. Risk of osteoporosis can be identified using a bone density scan. Women ages 75 and over should discuss this with their caregivers, as should women after menopause who have other risk factors. Ask your caregiver whether you should be taking a calcium supplement and Vitamin D, to reduce the rate of osteoporosis.   Avoid drinking alcohol in excess (more than two drinks per day).  Avoid use of street drugs. Do not share needles with anyone. Ask for professional help if you need assistance or instructions on stopping the use of alcohol, cigarettes, and/or drugs.  Brush your teeth twice a day with fluoride toothpaste, and floss once a day. Good oral hygiene prevents tooth decay and gum disease. The problems  can be painful, unattractive, and can cause other health problems. Visit your dentist for a routine oral and dental check up and preventive care every 6-12 months.   Look at your skin regularly.  Use a mirror to look at your back. Notify your caregivers of changes in moles, especially if there are changes in shapes, colors, a size larger than a pencil eraser, an irregular border, or development of new moles.  Safety:  Use seatbelts 100% of the time, whether driving or as a passenger.  Use safety devices such as hearing protection if you work in environments with loud noise or significant background noise.  Use safety glasses when doing any work that could send debris in to the eyes.  Use a helmet if you ride a bike or motorcycle.  Use appropriate safety gear for contact sports.  Talk to your caregiver about gun safety.  Use sunscreen with a SPF (or skin protection factor) of 15 or greater.  Lighter skinned people are at a greater risk of skin cancer. Don't forget to also wear sunglasses in order to protect your eyes from  too much damaging sunlight. Damaging sunlight can accelerate cataract formation.   Practice safe sex. Use condoms. Condoms are used for birth control and to help reduce the spread of sexually transmitted infections (or STIs).  Some of the STIs are gonorrhea (the clap), chlamydia, syphilis, trichomonas, herpes, HPV (human papilloma virus) and HIV (human immunodeficiency virus) which causes AIDS. The herpes, HIV and HPV are viral illnesses that have no cure. These can result in disability, cancer and death.   Keep carbon monoxide and smoke detectors in your home functioning at all times. Change the batteries every 6 months or use a model that plugs into the wall.   Vaccinations:  Stay up to date with your tetanus shots and other required immunizations. You should have a booster for tetanus every 10 years. Be sure to get your flu shot every year, since 5%-20% of the [place] population  comes down with the flu. The flu vaccine changes each year, so being vaccinated once is not enough. Get your shot in the fall, before the flu season peaks.   Other vaccines to consider:  Human Papilloma Virus or HPV causes cancer of the cervix, and other infections that can be transmitted from person to person. There is a vaccine for HPV, and females should get immunized between the ages of 38 and 76. It requires a series of 3 shots.   Pneumococcal vaccine to protect against certain types of pneumonia.  This is normally recommended for adults age 61 or older.  However, adults younger than 66 years old with certain underlying conditions such as diabetes, heart or lung disease should also receive the vaccine.  Shingles vaccine to protect against Varicella Zoster if you are older than age 76, or younger than 66 years old with certain underlying illness.  Hepatitis A vaccine to protect against a form of infection of the liver by a virus acquired from food.  Hepatitis B vaccine to protect against a form of infection of the liver by a virus acquired from blood or body fluids, particularly if you work in health care.  If you plan to travel internationally, check with your local health department for specific vaccination recommendations.  Cancer Screening:  Breast cancer screening is essential to preventive care for women. All women age 37 and older should perform a breast self-exam every month. At age 85 and older, women should have their caregiver complete a breast exam each year. Women at ages 82 and older should have a mammogram (x-ray film) of the breasts. Your caregiver can discuss how often you need mammograms.    Cervical cancer screening includes taking a Pap smear (sample of cells examined under a microscope) from the cervix (end of the uterus). It also includes testing for HPV (Human Papilloma Virus, which can cause cervical cancer). Screening and a pelvic exam should begin at age 33, or 3  years after a woman becomes sexually active. Screening should occur every year, with a Pap smear but no HPV testing, up to age 13. After age 66, you should have a Pap smear every 3 years with HPV testing, if no HPV was found previously.   Most routine colon cancer screening begins at the age of 88. On a yearly basis, doctors may provide special easy to use take-home tests to check for hidden blood in the stool. Sigmoidoscopy or colonoscopy can detect the earliest forms of colon cancer and is life saving. These tests use a small camera at the end of a tube to directly examine  the colon. Speak to your caregiver about this at age 80, when routine screening begins (and is repeated every 5 years unless early forms of pre-cancerous polyps or small growths are found).

## 2014-05-13 LAB — URINALYSIS, ROUTINE W REFLEX MICROSCOPIC
BILIRUBIN URINE: NEGATIVE
Glucose, UA: NEGATIVE mg/dL
HGB URINE DIPSTICK: NEGATIVE
Ketones, ur: NEGATIVE mg/dL
Leukocytes, UA: NEGATIVE
Nitrite: NEGATIVE
PROTEIN: NEGATIVE mg/dL
Specific Gravity, Urine: 1.011 (ref 1.005–1.030)
Urobilinogen, UA: 1 mg/dL (ref 0.0–1.0)
pH: 6.5 (ref 5.0–8.0)

## 2014-05-13 LAB — MAGNESIUM: Magnesium: 2 mg/dL (ref 1.5–2.5)

## 2014-05-13 LAB — BASIC METABOLIC PANEL WITH GFR
BUN: 14 mg/dL (ref 6–23)
CHLORIDE: 102 meq/L (ref 96–112)
CO2: 25 mEq/L (ref 19–32)
CREATININE: 0.99 mg/dL (ref 0.50–1.10)
Calcium: 8.9 mg/dL (ref 8.4–10.5)
GFR, EST NON AFRICAN AMERICAN: 60 mL/min
GFR, Est African American: 69 mL/min
Glucose, Bld: 76 mg/dL (ref 70–99)
Potassium: 3.8 mEq/L (ref 3.5–5.3)
SODIUM: 140 meq/L (ref 135–145)

## 2014-05-13 LAB — MICROALBUMIN / CREATININE URINE RATIO
Creatinine, Urine: 64.1 mg/dL
MICROALB UR: 0.5 mg/dL (ref 0.00–1.89)
Microalb Creat Ratio: 7.8 mg/g (ref 0.0–30.0)

## 2014-05-13 LAB — IRON AND TIBC
%SAT: 14 % — AB (ref 20–55)
IRON: 43 ug/dL (ref 42–145)
TIBC: 297 ug/dL (ref 250–470)
UIBC: 254 ug/dL (ref 125–400)

## 2014-05-13 LAB — LIPID PANEL
CHOL/HDL RATIO: 3.5 ratio
Cholesterol: 145 mg/dL (ref 0–200)
HDL: 41 mg/dL (ref >39)
LDL Cholesterol: 75 mg/dL (ref 0–99)
Triglycerides: 146 mg/dL (ref <150)
VLDL: 29 mg/dL (ref 0–40)

## 2014-05-13 LAB — HEPATIC FUNCTION PANEL
ALBUMIN: 3.7 g/dL (ref 3.5–5.2)
ALK PHOS: 99 U/L (ref 39–117)
ALT: 19 U/L (ref 0–35)
AST: 22 U/L (ref 0–37)
BILIRUBIN DIRECT: 0.1 mg/dL (ref 0.0–0.3)
BILIRUBIN TOTAL: 0.4 mg/dL (ref 0.2–1.2)
Indirect Bilirubin: 0.3 mg/dL (ref 0.2–1.2)
Total Protein: 6.4 g/dL (ref 6.0–8.3)

## 2014-05-13 LAB — FERRITIN: Ferritin: 30 ng/mL (ref 10–291)

## 2014-05-13 LAB — VITAMIN B12: Vitamin B-12: 1653 pg/mL — ABNORMAL HIGH (ref 211–911)

## 2014-05-13 LAB — VITAMIN D 25 HYDROXY (VIT D DEFICIENCY, FRACTURES): Vit D, 25-Hydroxy: 67 ng/mL (ref 30–89)

## 2014-05-13 LAB — TSH: TSH: 0.345 u[IU]/mL — ABNORMAL LOW (ref 0.350–4.500)

## 2014-05-13 LAB — INSULIN, FASTING: INSULIN FASTING, SERUM: 35 u[IU]/mL — AB (ref 3–28)

## 2014-05-23 ENCOUNTER — Other Ambulatory Visit: Payer: Self-pay | Admitting: Internal Medicine

## 2014-06-24 ENCOUNTER — Ambulatory Visit (INDEPENDENT_AMBULATORY_CARE_PROVIDER_SITE_OTHER): Payer: Managed Care, Other (non HMO) | Admitting: Physician Assistant

## 2014-06-24 ENCOUNTER — Encounter: Payer: Self-pay | Admitting: Physician Assistant

## 2014-06-24 DIAGNOSIS — N61 Mastitis without abscess: Secondary | ICD-10-CM

## 2014-06-24 DIAGNOSIS — E039 Hypothyroidism, unspecified: Secondary | ICD-10-CM

## 2014-06-24 DIAGNOSIS — N611 Abscess of the breast and nipple: Secondary | ICD-10-CM

## 2014-06-24 LAB — TSH: TSH: 6.674 u[IU]/mL — ABNORMAL HIGH (ref 0.350–4.500)

## 2014-06-24 MED ORDER — PHENTERMINE HCL 37.5 MG PO TABS: 37.5000 mg | ORAL_TABLET | Freq: Every day | ORAL | Status: DC

## 2014-06-24 MED ORDER — FLUCONAZOLE 150 MG PO TABS: 150.0000 mg | ORAL_TABLET | Freq: Every day | ORAL | Status: DC

## 2014-06-24 MED ORDER — DOXYCYCLINE HYCLATE 100 MG PO TABS: 100.0000 mg | ORAL_TABLET | Freq: Two times a day (BID) | ORAL | Status: DC

## 2014-06-24 NOTE — Patient Instructions (Addendum)
Use heating pad Take Doxy with food Follow up in 7-10 days  We may need to get an Ultrasound and a Biopsy  Biceps Tendon Tendinitis (Proximal) and Tenosynovitis with Rehab Tendonitis and tenosynovitis involve inflammation of the tendon and the tendon lining (sheath). The proximal biceps tendon is vulnerable to tendonitis and tenosynovitis, which causes pain and discomfort in the front of the shoulder and upper arm. The tendon lining secretes a fluid that helps lubricate the tendon, allowing for proper function without pain. When the tendon and its lining become inflamed, the tendon can no longer glide smoothly, causing pain. The proximal biceps tendon connects the biceps muscle to two bones of the shoulder. It is important for proper function of the elbow and turning the palm upward (supination) using the wrist. Proximal biceps tendon tendinitis may include a grade 1 or 2 strain of the tendon. Grade 1 strains involve a slight pull of the tendon without signs of tearing and no observed tendon lengthening. There is also no loss of strength. Grade 2 strains involve small tears in the tendon fibers. The tendon or muscle is stretched and strength is usually decreased.  SYMPTOMS   Pain, tenderness, swelling, warmth, or redness over the front of the shoulder.  Pain that gets worse with shoulder and elbow use, especially against resistance.  Limited motion of the shoulder or elbow.  Crackling sound (crepitation) when the tendon or shoulder is moved or touched. CAUSES  The symptoms of biceps tendonitis are due to inflammation of the tendon. Inflammation may be caused by:  Strain from sudden increase in amount or intensity of activity.  Direct blow or injury to the elbow (uncommon).  Overuse or repetitive elbow bending or wrist rotation, particularly when turning the palm up, or with elbow hyperextension. RISK INCREASES WITH:  Sports that involve contact or overhead arm activity (throwing sports,  gymnastics, weightlifting, bodybuilding, rock climbing).  Heavy labor.  Poor strength and flexibility.  Failure to warm up properly before activity. PREVENTION  Warm up and stretch properly before activity.  Allow time for recovery between activities.  Maintain physical fitness:  Strength, flexibility, and endurance.  Cardiovascular fitness.  Learn and use proper exercise technique. PROGNOSIS  With proper treatment, proximal biceps tendon tendonitis and tenosynovitis is usually curable within 6 weeks. Healing is usually quicker if the cause was a direct blow, not overuse.  RELATED COMPLICATIONS   Longer healing time if not properly treated or if not given enough time to heal.  Chronically inflamed tendon that causes persistent pain with activity, that may progress to constant pain and potentially rupture of the tendon.  Recurring symptoms, especially if activity is resumed too soon or with overuse, a direct blow, or use of poor exercise technique. TREATMENT Treatment first involves ice and medicine, to reduce pain and inflammation. It is helpful to modify activities that cause pain, to reduce the chances of causing the condition to get worse. Strengthening and stretching exercises should be performed to promote proper use of the muscles of the shoulder. These exercises may be performed at home or with a therapist. Other treatments may be given such as ultrasound or heat therapy. A corticosteroid injection may be recommended to help reduce inflammation of the tendon lining. Surgery is usually not necessary. Sometimes, if symptoms last for greater than 6 months, surgery will be advised to detach the tendon and re-insert it into the arm bone. Surgery to correct other shoulder problems that may be contributing to tendinitis may be advised before  surgery for the tendinitis itself.  MEDICATION  If pain medicine is needed, nonsteroidal anti-inflammatory medicines (aspirin and ibuprofen), or  other minor pain relievers (acetaminophen), are often advised.  Do not take pain medicine for 7 days before surgery.  Prescription pain relievers may be given if your caregiver thinks they are needed. Use only as directed and only as much as you need.  Corticosteroid injections may be given. These injections should only be used on the most severe cases, as one can only receive a limited number of them. HEAT AND COLD   Cold treatment (icing) should be applied for 10 to 15 minutes every 2 to 3 hours for inflammation and pain, and immediately after activity that aggravates your symptoms. Use ice packs or an ice massage.  Heat treatment may be used before performing stretching and strengthening activities prescribed by your caregiver, physical therapist, or athletic trainer. Use a heat pack or a warm water soak. SEEK MEDICAL CARE IF:   Symptoms get worse or do not improve in 2 weeks, despite treatment.  New, unexplained symptoms develop. (Drugs used in treatment may produce side effects.) EXERCISES RANGE OF MOTION (ROM) AND EXERCISES - Biceps Tendon (Proximal) and Tenosynovitis These exercises may help you when beginning to rehabilitate your injury. Your symptoms may go away with or without further involvement from your physician, physical therapist, or athletic trainer. While completing these exercises, remember:   Restoring tissue flexibility helps normal motion to return to the joints. This allows healthier, less painful movement and activity.  An effective stretch should be held for at least 30 seconds.  A stretch should never be painful. You should only feel a gentle lengthening or release in the stretched tissue. STRETCH - Flexion, Standing  Stand with good posture. With an underhand grip on your right / left hand and an overhand grip on the opposite hand, grasp a broomstick or cane so that your hands are a little more than shoulder width apart.  Keeping your right / left elbow  straight and shoulder muscles relaxed, push the stick with your opposite hand to raise your right / left arm in front of your body and then overhead. Raise your arm until you feel a stretch in your right / left shoulder, but before you have increased shoulder pain.  Try to avoid shrugging your right / left shoulder as your arm rises, by keeping your shoulder blade tucked down and toward your mid-back spine. Hold for __________ seconds.  Slowly return to the starting position. Repeat __________ times. Complete this exercise __________ times per day. STRETCH - Abduction, Supine  Lie on your back. With an underhand grip on your right / left hand and an overhand grip on the opposite hand, grasp a broomstick or cane so that your hands are a little more than shoulder width apart.  Keeping your right / left elbow straight and shoulder muscles relaxed, push the stick with your opposite hand to raise your right / left arm out to the side of your body and then overhead. Raise your arm until you feel a stretch in your right / left shoulder, but before you have increased shoulder pain.  Try to avoid shrugging your right / left shoulder as your arm rises, by keeping your shoulder blade tucked down and toward your mid-back spine. Hold for __________ seconds.  Slowly return to the starting position. Repeat __________ times. Complete this exercise __________ times per day. ROM - Flexion, Active-Assisted  Lie on your back. You may bend  your knees for comfort.  Grasp a broomstick or cane so your hands are about shoulder width apart. Your right / left hand should grip the end of the stick so that your hand is positioned "thumbs-up," as if you were about to shake hands.  Using your healthy arm to lead, raise your right / left arm overhead until you feel a gentle stretch in your shoulder. Hold for __________ seconds.  Use the stick to assist in returning your right / left arm to its starting position. Repeat  __________ times. Complete this exercise __________ times per day.  STRETCH - Flexion, Standing   Stand facing a wall. Walk your right / left fingers up the wall until you feel a moderate stretch in your shoulder. As your hand gets higher, you may need to step closer to the wall or use a door frame to walk through.  Try to avoid shrugging your right / left shoulder as your arm rises, by keeping your shoulder blade tucked down and toward your mid-back spine.  Hold for __________ seconds. Use your other hand, if needed, to ease out of the stretch and return to the starting position. Repeat __________ times. Complete this exercise __________ times per day.  ROM - Internal Rotation   Using underhand grips, grasp a stick behind your back with both hands.  While standing upright with good posture, slide the stick up your back until you feel a mild stretch in the front of your shoulder.  Hold for __________ seconds. Slowly return to your starting position. Repeat __________ times. Complete this exercise __________ times per day.  STRETCH - Internal Rotation  Place your right / left hand behind your back, palm-up.  Throw a towel or belt over your opposite shoulder. Grasp the towel with your right / left hand.  While keeping an upright posture, gently pull up on the towel until you feel a stretch in the front of your right / left shoulder.  Avoid shrugging your right / left shoulder as your arm rises, by keeping your shoulder blade tucked down and toward your mid-back spine.  Hold for __________ seconds. Release the stretch by lowering your opposite hand. Repeat __________ times. Complete this exercise __________ times per day. STRENGTHENING EXERCISES - Biceps Tendon Tendinitis (Proximal) and Tenosynovitis These exercises may help you regain your strength after your physician has discontinued your restraint in a cast or brace. They may resolve your symptoms with or without further involvement  from your physician, physical therapist or athletic trainer. While completing these exercises, remember:   Muscles can gain both the endurance and the strength needed for everyday activities through controlled exercises.  Complete these exercises as instructed by your physician, physical therapist or athletic trainer. Increase the resistance and repetitions only as guided.  You may experience muscle soreness or fatigue, but the pain or discomfort you are trying to eliminate should never worsen during these exercises. If this pain does get worse, stop and make sure you are following the directions exactly. If the pain is still present after adjustments, discontinue the exercise until you can discuss the trouble with your caregiver. STRENGTH - Elbow Flexors, Isometric  Stand or sit upright on a firm surface. Place your right / left arm so that your hand is palm-up and at the height of your waist.  Place your opposite hand on top of your forearm. Gently push down as your right / left arm resists. Push as hard as you can with both arms, without causing  any pain or movement at your right / left elbow. Hold this stationary position for __________ seconds.  Gradually release the tension in both arms. Allow your muscles to relax completely before repeating. Repeat __________ times. Complete this exercise __________ times per day. STRENGTH - Shoulder Flexion, Isometric  With good posture and facing a wall, stand or sit about 4-6 inches away.  Keeping your right / left elbow straight, gently press the top of your fist into the wall. Increase the pressure gradually until you are pressing as hard as you can, without shrugging your shoulder or increasing any shoulder discomfort.  Hold for __________ seconds.  Release the tension slowly. Relax your shoulder muscles completely before you start the next repetition. Repeat __________ times. Complete this exercise __________ times per day.  STRENGTH - Elbow  Flexors, Supinated  With good posture, stand or sit on a firm chair without armrests. Allow your right / left arm to rest at your side with your palm facing forward.  Holding a __________ weight, or gripping a rubber exercise band or tubing,  bring your hand toward your shoulder.  Allow your muscles to control the resistance as your hand returns to your side. Repeat __________ times. Complete this exercise __________ times per day.  STRENGTH - Shoulder Flexion  Stand or sit with good posture. Grasp a __________ weight, or an exercise band or tubing, so that your hand is "thumbs-up," like when you shake hands.  Slowly lift your right / left arm as far as you can, without increasing any shoulder pain. At first, many people can only raise their hand to shoulder height.  Avoid shrugging your right / left shoulder as your arm rises, by keeping your shoulder blade tucked down and toward your mid-back spine.  Hold for __________ seconds. Control the descent of your hand as you slowly return to your starting position. Repeat __________ times. Complete this exercise __________ times per day. Document Released: 11/07/2005 Document Revised: 01/30/2012 Document Reviewed: 02/19/2009 [facility] Patient Information 2015 [place], [place]. This information is not intended to replace advice given to you by your health care provider. Make sure you discuss any questions you have with your health care provider.

## 2014-06-24 NOTE — Progress Notes (Signed)
   Subjective:    Patient ID: [name], female    DOB: 06-12-1946, 68 y.o.   MRN: [id number]  HPI 68 y.o. female presents with left breast pain since Friday. She has had a normal MGM 04/2014 at [facility]. She states Friday evening she began to have tenderness under left breast, got progressively worse over the weekend and began to get warm and red with a bump. She has been putting alcohol on it. Denies discharge from her nipple, fever, chills, nausea, weight loss, night sweats.   Review of Systems  Constitutional: Negative.   HENT: Negative.   Respiratory: Negative.   Cardiovascular: Negative.   Genitourinary: Negative.   Skin: Positive for rash. Negative for pallor and wound.       Objective:   Physical Exam  Constitutional: She appears well-developed and well-nourished.  Cardiovascular: Normal rate and regular rhythm.   Pulmonary/Chest: Effort normal and breath sounds normal. Right breast exhibits mass (2x3 inch hard, tender, erythematous mass, mobile. + nipple discharge expressed yellow/white discharge), nipple discharge and tenderness. Right breast exhibits no inverted nipple and no skin change.      Assessment & Plan:  Breast Mass- very tender, red/with discharge, after long discussion we will treat with doxycycline 100mg  BID for 10 days and if it is not better will get US/evaluate to rule out breast cancer - follow up 10 days

## 2014-06-25 ENCOUNTER — Other Ambulatory Visit: Payer: Self-pay | Admitting: *Deleted

## 2014-06-25 MED ORDER — PREGABALIN 75 MG PO CAPS: 75.0000 mg | ORAL_CAPSULE | Freq: Three times a day (TID) | ORAL | Status: DC

## 2014-07-02 ENCOUNTER — Encounter: Payer: Self-pay | Admitting: Physician Assistant

## 2014-07-02 ENCOUNTER — Ambulatory Visit (INDEPENDENT_AMBULATORY_CARE_PROVIDER_SITE_OTHER): Payer: Managed Care, Other (non HMO) | Admitting: Physician Assistant

## 2014-07-02 VITALS — BP 124/66 | HR 100 | Temp 98.0°F | Resp 18 | Ht 67.0 in | Wt 228.0 lb

## 2014-07-02 DIAGNOSIS — N6459 Other signs and symptoms in breast: Secondary | ICD-10-CM

## 2014-07-02 DIAGNOSIS — N6452 Nipple discharge: Secondary | ICD-10-CM

## 2014-07-02 DIAGNOSIS — N61 Mastitis without abscess: Secondary | ICD-10-CM

## 2014-07-02 DIAGNOSIS — N611 Abscess of the breast and nipple: Secondary | ICD-10-CM

## 2014-07-02 NOTE — Patient Instructions (Signed)
Breast Cyst A breast cyst is a sac in the breast that is filled with fluid. Breast cysts are common in women. Women can have one or many cysts. When the breasts contain many cysts, it is usually due to a noncancerous (benign) condition called fibrocystic change. These lumps form under the influence of female hormones (estrogen and progesterone). The lumps are most often located in the upper, outer portion of the breast. They are often more swollen, painful, and tender before your period starts. They usually disappear after menopause, unless you are on hormone therapy.  There are several types of cysts:  Macrocyst. This is a cyst that is about 2 in. (5.1 cm) in diameter.   Microcyst. This is a tiny cyst that you cannot feel but can be seen with a mammogram or an ultrasound.   Galactocele. This is a cyst containing milk that may develop if you suddenly stop breastfeeding.   Sebaceous cyst of the skin. This type of cyst is not in the breast tissue itself. Breast cysts do not increase your risk of breast cancer. However, they must be monitored closely because they can be cancerous.  CAUSES  It is not known exactly what causes a breast cyst to form. Possible causes include:  An overgrowth of milk glands and connective tissue in the breast can block the milk glands, causing them to fill with fluid.   Scar tissue in the breast from previous surgery may block the glands, causing a cyst.  RISK FACTORS Estrogen may influence the development of a breast cyst.  SIGNS AND SYMPTOMS   Feeling a smooth, round, soft lump (like a grape) in the breast that is easily moveable.   Breast discomfort or pain.  Increase in size of the lump before your menstrual period and decrease in its size after your menstrual period.  DIAGNOSIS  A cyst can be felt during a physical exam by your health care provider. A breast X-ray exam (mammogram) and ultrasonography will be done to confirm the diagnosis. Fluid may  be removed from the cyst with a needle (fine needle aspiration) to make sure the cyst is not cancerous.  TREATMENT  Treatment may not be necessary. Your health care provider may monitor the cyst to see if it goes away on its own. If treatment is needed, it may include:  Hormone treatment.   Needle aspiration. There is a chance of the cyst coming back after aspiration.   Surgery to remove the whole cyst.  HOME CARE INSTRUCTIONS   Keep all follow-up appointments with your health care provider.  See your health care provider regularly:  Get a yearly exam by your health care provider.  Have a clinical breast exam by a health care provider every 1-3 years if you are 20-40 years of age. After age 40 years, you should have the exam every year.   Get mammogram tests as directed by your health care provider.   Understand the normal appearance and feel of your breasts and perform breast self-exams.   Only take over-the-counter or prescription medicines as directed by your health care provider.   Wear a supportive bra, especially when exercising.   Avoid caffeine.   Reduce your salt intake, especially before your menstrual period. Too much salt can cause fluid retention, breast swelling, and discomfort.  SEEK MEDICAL CARE IF:   You feel, or think you feel, a lump in your breast.   You notice that both breasts look or feel different than usual.   Your   breast is still causing pain after your menstrual period is over.   You need medicine for breast pain and swelling that occurs with your menstrual period.  SEEK IMMEDIATE MEDICAL CARE IF:   You have severe pain, tenderness, redness, or warmth in your breast.   You have nipple discharge or bleeding.   Your breast lump becomes hard and painful.   You find new lumps or bumps that were not there before.   You feel lumps in your armpit (axilla).   You notice dimpling or wrinkling of the breast or nipple.   You  have a fever.  MAKE SURE YOU:  Understand these instructions.  Will watch your condition.  Will get help right away if you are not doing well or get worse. Document Released: 11/07/2005 Document Revised: 07/10/2013 Document Reviewed: 06/06/2013 [employer] Patient Information 2015 [employer]. This information is not intended to replace advice given to you by your health care provider. Make sure you discuss any questions you have with your health care provider.  

## 2014-07-02 NOTE — Progress Notes (Signed)
   Subjective:    Patient ID: [name], female    DOB: 1946/01/14, 68 y.o.   MRN: [id number]  HPI 68 y.o. female presents for 10 day follow up. On 06/25/2015 she presented with left breast pain with mass, redness, erythema. She has had a normal MGM 04/2014 at [place]. She did not want imaging at that time so she was given doxycycline and presents today for follow up. She states that the redness, warmth and pain is better, she continues to have the mass on left breast with mild pain but is about 80-90 improved.    Review of Systems  Constitutional: Negative.   HENT: Negative.   Respiratory: Negative.   Cardiovascular: Negative.   Genitourinary: Negative.   Skin: Positive for rash. Negative for pallor and wound.       Objective:   Physical Exam  Constitutional: She appears well-developed and well-nourished.  Cardiovascular: Normal rate and regular rhythm.   Pulmonary/Chest: Effort normal and breath sounds normal.  Left breast deep 2x1.5 inch tender mass without warmth, redness with yellow/white discharge expressed from nipple, no blood.   Skin: Skin is warm and dry. No rash noted.        Assessment & Plan:  Left breast mass- decrease in size, decrease tenderness with ABX however still able to express a discharge and there is still a deep mass so we are going to get an [place] to evaluate cyst.

## 2014-07-09 ENCOUNTER — Other Ambulatory Visit: Payer: Self-pay | Admitting: Internal Medicine

## 2014-07-09 ENCOUNTER — Telehealth: Payer: Self-pay | Admitting: *Deleted

## 2014-07-09 NOTE — Telephone Encounter (Signed)
Pt said that she is waiting on a appointment for imaging appointment. pt is calling to check status of this please call pt let her know the status.if she dosent answer please leave a message.

## 2014-07-10 NOTE — Telephone Encounter (Signed)
Orders were faxed to [facility] for breast ultrasound. Left message on patient's voicemail to call their office to schedule.

## 2014-08-04 ENCOUNTER — Other Ambulatory Visit: Payer: Self-pay | Admitting: *Deleted

## 2014-08-04 MED ORDER — ESOMEPRAZOLE MAGNESIUM 40 MG PO CPDR: DELAYED_RELEASE_CAPSULE | ORAL | Status: DC

## 2014-08-19 ENCOUNTER — Encounter: Payer: Self-pay | Admitting: Physician Assistant

## 2014-08-19 ENCOUNTER — Ambulatory Visit (INDEPENDENT_AMBULATORY_CARE_PROVIDER_SITE_OTHER): Payer: Managed Care, Other (non HMO) | Admitting: Physician Assistant

## 2014-08-19 VITALS — BP 128/76 | HR 88 | Temp 98.1°F | Resp 16 | Ht 67.0 in | Wt 234.0 lb

## 2014-08-19 DIAGNOSIS — E039 Hypothyroidism, unspecified: Secondary | ICD-10-CM

## 2014-08-19 DIAGNOSIS — R7309 Other abnormal glucose: Secondary | ICD-10-CM

## 2014-08-19 DIAGNOSIS — E559 Vitamin D deficiency, unspecified: Secondary | ICD-10-CM

## 2014-08-19 DIAGNOSIS — Z79899 Other long term (current) drug therapy: Secondary | ICD-10-CM

## 2014-08-19 DIAGNOSIS — Z6837 Body mass index (BMI) 37.0-37.9, adult: Secondary | ICD-10-CM

## 2014-08-19 DIAGNOSIS — R7303 Prediabetes: Secondary | ICD-10-CM

## 2014-08-19 DIAGNOSIS — E669 Obesity, unspecified: Secondary | ICD-10-CM

## 2014-08-19 DIAGNOSIS — Z23 Encounter for immunization: Secondary | ICD-10-CM

## 2014-08-19 DIAGNOSIS — K2100 Gastro-esophageal reflux disease with esophagitis, without bleeding: Secondary | ICD-10-CM

## 2014-08-19 DIAGNOSIS — E785 Hyperlipidemia, unspecified: Secondary | ICD-10-CM

## 2014-08-19 LAB — CBC WITH DIFFERENTIAL/PLATELET
BASOS PCT: 0 % (ref 0–1)
Basophils Absolute: 0 10*3/uL (ref 0.0–0.1)
EOS ABS: 0.2 10*3/uL (ref 0.0–0.7)
EOS PCT: 2 % (ref 0–5)
HCT: 38.5 % (ref 36.0–46.0)
Hemoglobin: 12.7 g/dL (ref 12.0–15.0)
Lymphocytes Relative: 36 % (ref 12–46)
Lymphs Abs: 4.2 10*3/uL — ABNORMAL HIGH (ref 0.7–4.0)
MCH: 26.6 pg (ref 26.0–34.0)
MCHC: 33 g/dL (ref 30.0–36.0)
MCV: 80.7 fL (ref 78.0–100.0)
Monocytes Absolute: 0.8 10*3/uL (ref 0.1–1.0)
Monocytes Relative: 7 % (ref 3–12)
NEUTROS PCT: 55 % (ref 43–77)
Neutro Abs: 6.4 10*3/uL (ref 1.7–7.7)
PLATELETS: 332 10*3/uL (ref 150–400)
RBC: 4.77 MIL/uL (ref 3.87–5.11)
RDW: 15.9 % — AB (ref 11.5–15.5)
WBC: 11.6 10*3/uL — ABNORMAL HIGH (ref 4.0–10.5)

## 2014-08-19 MED ORDER — PHENTERMINE HCL 37.5 MG PO TABS: 37.5000 mg | ORAL_TABLET | Freq: Every day | ORAL | Status: DC

## 2014-08-19 NOTE — Patient Instructions (Signed)
Bad carbs also include fruit juice, alcohol, and sweet tea. These are empty calories that do not signal to your brain that you are full.   Please remember the good carbs are still carbs which convert into sugar. So please measure them out no more than 1/2-1 cup of rice, oatmeal, pasta, and beans.  Veggies are however free foods! Pile them on.   I like lean protein at every meal such as chicken, [place], pork chops, cottage cheese, etc. Just do not fry these meats and please center your meal around vegetable, the meats should be a side dish.   No all fruit is created equal. Please see the list below, the fruit at the bottom is higher in sugars than the fruit at the top   Phentermine  While taking the medication we may ask that you come into the office once a month or once every 2-3 months to monitor your weight, blood pressure, and heart rate. In addition we can help answer your questions about diet, exercise, and help you every step of the way with your weight loss journey. Sometime it is helpful if you bring in a food diary or use an app on your phone such as myfitnesspal to record your calorie intake, especially in the beginning.   You can start out on 1/3 to 1/2 a pill in the morning and if you are tolerating it well you can increase to one pill daily. I also have some patients that take 1/3 or 1/2 at lunch to help prevent night time eating.   What is this medicine? PHENTERMINE (FEN ter meen) decreases your appetite. This medicine is intended to be used in addition to a healthy reduced calorie diet and exercise. The best results are achieved this way. This medicine is only indicated for short-term use. Eventually your weight loss may level out and the medication will no longer be needed.   How should I use this medicine? Take this medicine by mouth. Follow the directions on the prescription label. The tablets should stay in the bottle until immediately before you take your dose. Take your  doses at regular intervals. Do not take your medicine more often than directed.  Overdosage: If you think you have taken too much of this medicine contact a poison control center or emergency room at once. NOTE: This medicine is only for you. Do not share this medicine with others.  What if I miss a dose? If you miss a dose, take it as soon as you can. If it is almost time for your next dose, take only that dose. Do not take double or extra doses. Do not increase or in any way change your dose without consulting your doctor.  What should I watch for while using this medicine? Notify your physician immediately if you become short of breath while doing your normal activities. Do not take this medicine within 6 hours of bedtime. It can keep you from getting to sleep. Avoid drinks that contain caffeine and try to stick to a regular bedtime every night. Do not stand or sit up quickly, especially if you are an older patient. This reduces the risk of dizzy or fainting spells. Avoid alcoholic drinks.  What side effects may I notice from receiving this medicine? Side effects that you should report to your doctor or health care professional as soon as possible: -chest pain, palpitations -depression or severe changes in mood -increased blood pressure -irritability -nervousness or restlessness -severe dizziness -shortness of breath -problems  urinating -unusual swelling of the legs -vomiting  Side effects that usually do not require medical attention (report to your doctor or health care professional if they continue or are bothersome): -blurred vision or other eye problems -changes in sexual ability or desire -constipation or diarrhea -difficulty sleeping -dry mouth or unpleasant taste -headache -nausea This list may not describe all possible side effects. Call your doctor for medical advice about side effects. You may report side effects to FDA at 1-800-FDA-1088.

## 2014-08-19 NOTE — Progress Notes (Signed)
Assessment and Plan:  Hypertension: Continue medication, monitor blood pressure at home. Continue DASH diet. Cholesterol: Continue diet and exercise. Check cholesterol.  Pre-diabetes-Continue diet and exercise. Check A1C Vitamin D Def- check level and continue medications.  Obesity with co morbidities- long discussion about weight loss, diet, and exercise Skin tags and 1 that is suspicious for possible SCC- schedule removal Hypothyroidism-check TSH level, continue medications the same.    Continue diet and meds as discussed. Further disposition pending results of labs.  HPI 68 y.o. female  presents for 3 month follow up with hypertension, hyperlipidemia, prediabetes and vitamin D. Her blood pressure has been controlled at home, today their BP is BP: 128/76 mmHg She does workout but she just got a new exercise equipment that she wants to use. She denies chest pain, shortness of breath, dizziness.  She is on cholesterol medication and denies myalgias. Her cholesterol is at goal. The cholesterol last visit was:   Lab Results  Component Value Date   CHOL 145 05/12/2014   HDL 41 05/12/2014   LDLCALC 75 05/12/2014   TRIG 146 05/12/2014   CHOLHDL 3.5 05/12/2014   She has been working on diet and exercise for prediabetes, and denies paresthesia of the feet, polydipsia and polyuria. Last A1C in the office was:  Lab Results  Component Value Date   HGBA1C 6.3* 05/12/2014   Patient is on Vitamin D supplement.   Lab Results  Component Value Date   VD25OH 67 05/12/2014     She is on thyroid medication. Her medication was changed last visit, she is on 1 daily and 1/2 thursday. Patient denies nervousness, palpitations and weight changes.  Lab Results  Component Value Date   TSH 6.674* 06/24/2014  .  BMI is Body mass index is 36.64 kg/(m^2)., she is struggling with weight loss due to  Wt Readings from Last 3 Encounters:  08/19/14 234 lb (106.142 kg)  07/02/14 228 lb (103.42 kg)  06/24/14 229 lb  (103.874 kg)    Current Medications:  Current Outpatient Prescriptions on File Prior to Visit  Medication Sig Dispense Refill  . aspirin 81 MG tablet Take 81 mg by mouth daily.      Marland Kitchen atorvastatin (LIPITOR) 40 MG tablet TAKE 1 TABLET AT BEDTIME   FOR CHOLESTEROL  90 tablet  1  . b complex vitamins tablet Take 1 tablet by mouth daily.      Marland Kitchen CALCIUM PO Take 1,200 mg by mouth daily.       Marland Kitchen diltiazem (DILACOR XR) 240 MG 24 hr capsule TAKE 1 CAPSULE EVERY DAY  90 capsule  3  . esomeprazole (NEXIUM) 40 MG capsule TAKE 1 CAPSULE DAILY  90 capsule  1  . estradiol (ESTRACE) 0.5 MG tablet TAKE 1 TABLET EVERY DAY  90 tablet  3  . furosemide (LASIX) 20 MG tablet TAKE 1 TABLET TWICE A DAY  180 tablet  3  . levothyroxine (SYNTHROID, LEVOTHROID) 125 MCG tablet Take 125 mcg by mouth daily.      . meloxicam (MOBIC) 7.5 MG tablet TAKE 1 TABLET DAILY FOR    PAIN WITH FOOD  90 tablet  3  . Multiple Vitamin (MULTIVITAMIN) tablet Take 1 tablet by mouth daily.      . nortriptyline (PAMELOR) 25 MG capsule Take 25 mg by mouth 2 (two) times daily.       Marland Kitchen SYNTHROID 125 MCG tablet TAKE 1 TABLET DAILY FOR    THYROID  90 tablet  3   No  current facility-administered medications on file prior to visit.   Medical History:  Past Medical History  Diagnosis Date  . Varicose veins   . Obesity   . Fatigue   . Chest tightness   . Myalgia   . GERD (gastroesophageal reflux disease)   . Ulcer   . Hyperlipidemia   . Hypothyroid   . Anemia   . Prediabetes    Allergies:  Allergies  Allergen Reactions  . Levaquin [Levofloxacin In D5w]   . Macrobid [Nitrofurantoin Macrocrystal]   . Penicillins     REACTION: rash  . Procaine Hcl     REACTION: difficulty breathing  . Sulfonamide Derivatives     REACTION: hives  . Tetanus Toxoid     REACTION: arm swells    Review of Systems: [X]  = complains of  [ ]  = denies  General: Fatigue [ ]  Fever [ ]  Chills [ ]  Weakness [ ]   Insomnia [ ]  Eyes: Redness [ ]  Blurred vision  [ ]  Diplopia [ ]   ENT: Congestion [ ]  Sinus Pain [ ]  Post Nasal Drip [ ]  Sore Throat [ ]  Earache [ ]   Cardiac: Chest pain/pressure [ ]  SOB [ ]  Orthopnea [ ]   Palpitations [ ]   Paroxysmal nocturnal dyspnea[ ]  Claudication [ ]  Edema [ ]   Pulmonary: Cough [ ]  Wheezing[ ]   SOB [ ]   Snoring [ ]   GI: Nausea [ ]  Vomiting[ ]  Dysphagia[ ]  Heartburn[ ]  Abdominal pain [ ]  Constipation [ ] ; Diarrhea [ ] ; BRBPR [ ]  Melena[ ]  GU: Hematuria[ ]  Dysuria [ ]  Nocturia[ ]  Urgency [ ]   Hesitancy [ ]  Discharge [ ]  Neuro: Headaches[ ]  Vertigo[ ]  Paresthesias[ ]  Spasm [ ]  Speech changes [ ]  Incoordination [ ]   Ortho: Arthritis [ ]  Joint pain [ ]  Muscle pain [ ]  Joint swelling [ ]  Back Pain [ ]  Skin:  Rash [ ]   Pruritis [ ]  Change in skin lesion [ ]   Psych: Depression[ ]  Anxiety[ ]  Confusion [ ]  Memory loss [ ]   Heme/Lypmh: Bleeding [ ]  Bruising [ ]  Enlarged lymph nodes [ ]   Endocrine: Visual blurring [ ]  Paresthesia [ ]  Polyuria [ ]  Polydypsea [ ]    Heat/cold intolerance [ ]  Hypoglycemia [ ]   Family history- Review and unchanged Social history- Review and unchanged Physical Exam: BP 128/76  Pulse 88  Temp(Src) 98.1 F (36.7 C)  Resp 16  Ht 5\' 7"  (1.702 m)  Wt 234 lb (106.142 kg)  BMI 36.64 kg/m2 Wt Readings from Last 3 Encounters:  08/19/14 234 lb (106.142 kg)  07/02/14 228 lb (103.42 kg)  06/24/14 229 lb (103.874 kg)   General Appearance: Well nourished, in no apparent distress. Eyes: PERRLA, EOMs, conjunctiva no swelling or erythema Sinuses: No Frontal/maxillary tenderness ENT/Mouth: Ext aud canals clear, TMs without erythema, bulging. No erythema, swelling, or exudate on post pharynx.  Tonsils not swollen or erythematous. Hearing normal.  Neck: Supple, thyroid normal.  Respiratory: Respiratory effort normal, BS equal bilaterally without rales, rhonchi, wheezing or stridor.  Cardio: RRR with no MRGs. Brisk peripheral pulses without edema.  Abdomen: Soft, + BS.  Non tender, no guarding, rebound,  hernias, masses. Lymphatics: Non tender without lymphadenopathy.  Musculoskeletal: Full ROM, 5/5 strength, normal gait.  Skin: Warm, dry without rashes, lesions, ecchymosis. Several skin tags she would like removed, one on her left supraclavicular that is erythematous with white flaky nodule Neuro: Cranial nerves intact. Normal muscle tone, no cerebellar symptoms. Sensation intact.  Psych: Awake and oriented  X 3, normal affect, Insight and Judgment appropriate.    Vicie Mutters 4:58 PM

## 2014-08-20 LAB — LIPID PANEL
Cholesterol: 146 mg/dL (ref 0–200)
HDL: 45 mg/dL (ref >39)
LDL Cholesterol: 58 mg/dL (ref 0–99)
Total CHOL/HDL Ratio: 3.2 Ratio
Triglycerides: 215 mg/dL — ABNORMAL HIGH (ref <150)
VLDL: 43 mg/dL — ABNORMAL HIGH (ref 0–40)

## 2014-08-20 LAB — BASIC METABOLIC PANEL WITH GFR
BUN: 15 mg/dL (ref 6–23)
CALCIUM: 9.2 mg/dL (ref 8.4–10.5)
CO2: 28 mEq/L (ref 19–32)
CREATININE: 0.95 mg/dL (ref 0.50–1.10)
Chloride: 105 mEq/L (ref 96–112)
GFR, EST AFRICAN AMERICAN: 72 mL/min
GFR, Est Non African American: 63 mL/min
Glucose, Bld: 92 mg/dL (ref 70–99)
Potassium: 3.7 mEq/L (ref 3.5–5.3)
Sodium: 141 mEq/L (ref 135–145)

## 2014-08-20 LAB — HEPATIC FUNCTION PANEL
ALT: 25 U/L (ref 0–35)
AST: 28 U/L (ref 0–37)
Albumin: 4 g/dL (ref 3.5–5.2)
Alkaline Phosphatase: 109 U/L (ref 39–117)
Bilirubin, Direct: 0.1 mg/dL (ref 0.0–0.3)
Indirect Bilirubin: 0.3 mg/dL (ref 0.2–1.2)
Total Bilirubin: 0.4 mg/dL (ref 0.2–1.2)
Total Protein: 6.7 g/dL (ref 6.0–8.3)

## 2014-08-20 LAB — HEMOGLOBIN A1C
Hgb A1c MFr Bld: 6.4 % — ABNORMAL HIGH (ref <5.7)
Mean Plasma Glucose: 137 mg/dL — ABNORMAL HIGH (ref <117)

## 2014-08-20 LAB — TSH: TSH: 2.016 u[IU]/mL (ref 0.350–4.500)

## 2014-08-20 LAB — VITAMIN D 25 HYDROXY (VIT D DEFICIENCY, FRACTURES): VIT D 25 HYDROXY: 67 ng/mL (ref 30–89)

## 2014-08-20 LAB — INSULIN, FASTING: Insulin fasting, serum: 27.4 u[IU]/mL — ABNORMAL HIGH (ref 2.0–19.6)

## 2014-08-20 LAB — MAGNESIUM: Magnesium: 2 mg/dL (ref 1.5–2.5)

## 2014-10-12 ENCOUNTER — Encounter: Payer: Self-pay | Admitting: *Deleted

## 2014-10-12 DIAGNOSIS — N6452 Nipple discharge: Secondary | ICD-10-CM

## 2014-10-12 DIAGNOSIS — N611 Abscess of the breast and nipple: Secondary | ICD-10-CM

## 2014-10-20 ENCOUNTER — Ambulatory Visit (INDEPENDENT_AMBULATORY_CARE_PROVIDER_SITE_OTHER): Payer: Managed Care, Other (non HMO) | Admitting: Physician Assistant

## 2014-10-20 VITALS — BP 110/70 | HR 76 | Temp 97.9°F | Resp 16 | Wt 232.0 lb

## 2014-10-20 DIAGNOSIS — L089 Local infection of the skin and subcutaneous tissue, unspecified: Secondary | ICD-10-CM

## 2014-10-20 DIAGNOSIS — B351 Tinea unguium: Secondary | ICD-10-CM

## 2014-10-20 DIAGNOSIS — L918 Other hypertrophic disorders of the skin: Secondary | ICD-10-CM

## 2014-10-20 MED ORDER — TERBINAFINE HCL 250 MG PO TABS: 250.0000 mg | ORAL_TABLET | Freq: Every day | ORAL | Status: DC

## 2014-10-20 NOTE — Patient Instructions (Signed)

## 2014-10-20 NOTE — Progress Notes (Signed)
  Subjective:     [name] is a 68 y.o. female who complains of skin tags. The patient wishes skin tag removed as the lesion is getting caught on clothing and/or jewelry and is recurrently irritated. She also has bilateral p. Neuropathy with bilateral toenail fungus, failed topical treatment and would like to try Lamisil.    The following portions of the patient's history were reviewed and updated as appropriate: allergies, current medications, past family history, past medical history, past social history, past surgical history and problem list.  Review of Systems Pertinent items are noted in HPI.    Objective:    Skin:  6 skin tag in the neck region, were removed using scissors and forceps after alcohol prep; hemostasis is obtained with hot cautery and discarded.   Toenails: thick, brittle   Assessment:    Chronically irritated skin tag, removed.   onychomycosis of toenails   Plan:     1. The patient is instructed to watch for signs of infection including erythema, pain,      purulent discharge, or crusting.  2.Onychomycosis- has tried OTC meds, willing to try Lamisil, side effects discussed, will follow up in 6 weeks for LFTs, cheapest at [employer]  2. Verbal patient instruction given. 3. Follow up as needed for acute illness.

## 2014-11-01 ENCOUNTER — Other Ambulatory Visit: Payer: Self-pay | Admitting: Physician Assistant

## 2014-11-03 ENCOUNTER — Other Ambulatory Visit: Payer: Self-pay | Admitting: *Deleted

## 2014-11-03 MED ORDER — ESOMEPRAZOLE MAGNESIUM 40 MG PO CPDR: DELAYED_RELEASE_CAPSULE | ORAL | Status: DC

## 2014-11-05 ENCOUNTER — Telehealth: Payer: Self-pay | Admitting: Physician Assistant

## 2014-11-05 ENCOUNTER — Other Ambulatory Visit: Payer: Self-pay | Admitting: Physician Assistant

## 2014-11-05 MED ORDER — PREDNISONE 20 MG PO TABS: ORAL_TABLET | ORAL | Status: DC

## 2014-11-05 NOTE — Telephone Encounter (Signed)
Patient complaining of right arm/shoulder pain, has had bursitis there before. Has been taking tylenol and aleve without help. Will send in prednisone pills and tell her to do bursitis exercises. May need to come in for injection/evaluation/Xray since recurrent. If any worsening symptoms, CP, SOB, she will go to ER.

## 2014-11-10 ENCOUNTER — Ambulatory Visit (INDEPENDENT_AMBULATORY_CARE_PROVIDER_SITE_OTHER): Payer: Managed Care, Other (non HMO) | Admitting: Physician Assistant

## 2014-11-10 ENCOUNTER — Encounter: Payer: Self-pay | Admitting: Physician Assistant

## 2014-11-10 ENCOUNTER — Ambulatory Visit: Payer: Self-pay | Admitting: Physician Assistant

## 2014-11-10 VITALS — BP 144/80 | HR 84 | Temp 98.0°F | Resp 18 | Ht 67.0 in | Wt 232.0 lb

## 2014-11-10 DIAGNOSIS — J309 Allergic rhinitis, unspecified: Secondary | ICD-10-CM

## 2014-11-10 DIAGNOSIS — M25511 Pain in right shoulder: Secondary | ICD-10-CM

## 2014-11-10 MED ORDER — CYCLOBENZAPRINE HCL 5 MG PO TABS: 5.0000 mg | ORAL_TABLET | Freq: Three times a day (TID) | ORAL | Status: AC

## 2014-11-10 NOTE — Progress Notes (Signed)
Subjective:    Patient ID: [name], female    DOB: [birth year], 68 y.o.   MRN: [id number]  Arm Pain  Incident onset: No incident occured. There was no injury mechanism. The pain is present in the upper right arm. The quality of the pain is described as aching (Dull). Radiates to: Past couple days it will go up into right shoulder. Pain scale: Bad day- 10 out of 10, but right now it is a 6 out of 10. Pain course: Pain is constant but no incident. Pertinent negatives include no numbness or tingling. Associated symptoms comments: Patient states it wakes her up at night.. The symptoms are aggravated by movement. Treatments tried: Tylenol, Aleve and muscle creams.  Did not take Prednisone due to not liking that medication.   Patient states she had Left Rotator cuff tear done by Dr. Percell Miller and Dr. Earleen Newport about more than 7 years ago.  Telephone Call on 11/05/14- Patient complaining of right arm/shoulder pain, has had bursitis there before. Has been taking tylenol and aleve without help. Will send in prednisone pills and tell her to do bursitis exercises. May need to come in for injection/evaluation/Xray since recurrent. If any worsening symptoms, CP, SOB, she will go to ER. Prednisone was given 11/05/14 by Vicie Mutters, PA-C.   Tonsillectomy GFR= 63 on 08/19/14 Review of Systems  Constitutional: Negative.  Negative for fever, chills, diaphoresis and fatigue.  HENT: Positive for congestion and rhinorrhea. Negative for ear discharge, ear pain, postnasal drip, sinus pressure, sore throat and trouble swallowing.        Stuffy nose  Eyes: Negative.   Respiratory: Positive for cough. Negative for chest tightness, shortness of breath and wheezing.        Dry cough  Cardiovascular: Negative.   Gastrointestinal: Negative.   Genitourinary: Negative.   Musculoskeletal: Negative.        Except for right arm pain  Skin: Negative.  Negative for rash.  Neurological: Positive for dizziness. Negative for  tingling, light-headedness, numbness and headaches.  Psychiatric/Behavioral: Negative.    Past Medical History  Diagnosis Date  . Varicose veins   . Obesity   . Fatigue   . Chest tightness   . Myalgia   . GERD (gastroesophageal reflux disease)   . Ulcer   . Hyperlipidemia   . Hypothyroid   . Anemia   . Prediabetes    Current Outpatient Prescriptions on File Prior to Visit  Medication Sig Dispense Refill  . aspirin 81 MG tablet Take 81 mg by mouth daily.    Marland Kitchen atorvastatin (LIPITOR) 40 MG tablet TAKE 1 TABLET AT BEDTIME   FOR CHOLESTEROL 90 tablet 1  . b complex vitamins tablet Take 1 tablet by mouth daily.    Marland Kitchen CALCIUM PO Take 1,200 mg by mouth daily.     Marland Kitchen diltiazem (DILACOR XR) 240 MG 24 hr capsule TAKE 1 CAPSULE EVERY DAY 90 capsule 3  . esomeprazole (NEXIUM) 40 MG capsule TAKE 1 CAPSULE DAILY 90 capsule 1  . estradiol (ESTRACE) 0.5 MG tablet TAKE 1 TABLET EVERY DAY 90 tablet 3  . furosemide (LASIX) 20 MG tablet TAKE 1 TABLET TWICE A DAY 180 tablet 3  . levothyroxine (SYNTHROID, LEVOTHROID) 125 MCG tablet Take 125 mcg by mouth daily.    . meloxicam (MOBIC) 7.5 MG tablet TAKE 1 TABLET DAILY FOR    PAIN WITH FOOD 90 tablet 3  . Multiple Vitamin (MULTIVITAMIN) tablet Take 1 tablet by mouth daily.    . nortriptyline (  PAMELOR) 25 MG capsule Take 25 mg by mouth 2 (two) times daily.     . phentermine (ADIPEX-P) 37.5 MG tablet Take 1 tablet (37.5 mg total) by mouth daily before breakfast. 30 tablet 2  . pregabalin (LYRICA) 75 MG capsule Take 75 mg by mouth 3 (three) times daily.    Marland Kitchen SYNTHROID 125 MCG tablet TAKE 1 TABLET DAILY FOR    THYROID 90 tablet 3  . terbinafine (LAMISIL) 250 MG tablet Take 1 tablet (250 mg total) by mouth daily. 90 tablet 0   No current facility-administered medications on file prior to visit.   Allergies  Allergen Reactions  . Levaquin [Levofloxacin In D5w]   . Macrobid [Nitrofurantoin Macrocrystal]   . Penicillins     REACTION: rash  . Procaine Hcl      REACTION: difficulty breathing  . Sulfonamide Derivatives     REACTION: hives  . Tetanus Toxoid     REACTION: arm swells   Past Surgical History  Procedure Laterality Date  . Abdominal hysterectomy    . Tonsillectomy and adenoidectomy    . Cataract extraction    . Nasal sinus surgery    . Rotator cuff repair Left more than 7 years ago    Dr. Percell Miller and Dr. Earleen Newport     BP 144/80 mmHg  Pulse 84  Temp(Src) 98 F (36.7 C) (Temporal)  Resp 18  Ht 5\' 7"  (1.702 m)  Wt 232 lb (105.235 kg)  BMI 36.33 kg/m2  SpO2 97% Wt Readings from Last 3 Encounters:  11/10/14 232 lb (105.235 kg)  10/20/14 232 lb (105.235 kg)  08/19/14 234 lb (106.142 kg)   Objective:   Physical Exam  Constitutional: She appears well-developed and well-nourished. She does not have a sickly appearance. No distress.  HENT:  Head: Normocephalic.  Right Ear: Tympanic membrane, external ear and ear canal normal.  Left Ear: Tympanic membrane, external ear and ear canal normal.  Nose: Nose normal. Right sinus exhibits no maxillary sinus tenderness and no frontal sinus tenderness. Left sinus exhibits no maxillary sinus tenderness and no frontal sinus tenderness.  Mouth/Throat: Uvula is midline, oropharynx is clear and moist and mucous membranes are normal. Mucous membranes are not pale and not dry. No trismus in the jaw. No uvula swelling. No oropharyngeal exudate, posterior oropharyngeal edema or posterior oropharyngeal erythema.  Eyes: Conjunctivae and lids are normal. Pupils are equal, round, and reactive to light. Right eye exhibits no discharge. Left eye exhibits no discharge. No scleral icterus.  Neck: Trachea normal, normal range of motion and phonation normal. Neck supple. No tracheal tenderness present. No tracheal deviation present.  Cardiovascular: Normal rate, regular rhythm, S1 normal, S2 normal, normal heart sounds, intact distal pulses and normal pulses.  Exam reveals no gallop, no distant heart sounds and no  friction rub.   No murmur heard. Pulmonary/Chest: Effort normal and breath sounds normal. No stridor. No respiratory distress. She has no decreased breath sounds. She has no wheezes. She has no rhonchi. She has no rales. She exhibits no tenderness.  Abdominal: Soft. Bowel sounds are normal.  Lymphadenopathy:  No tenderness or LAD.  Skin: Skin is warm, dry and intact. No rash noted. She is not diaphoretic. No pallor.  Psychiatric: She has a normal mood and affect. Her speech is normal and behavior is normal. Judgment and thought content normal. Cognition and memory are normal.  Vitals reviewed.  Musculoskeletal and Neuro: Alert and Orientated x 3.  Normal Gait.  Cranial Nerves II-XII intact.  Right positive for impingement sign.  Distal pulses +2, capillary refill, and sensation intact.   ROM Intact bilaterally in upper extremities, except for Apley's Scratch Test. Strength is normal and symmetric in arms.  Brachioradialis reflexes are normal bilaterally. Bicep reflexes are normal bilaterally.  Piano Key Test:  Negative  Apley's Scratch Test:  Postive on right and left due to old tear with abduction and external rotation and adduction and internal rotation. Empty Can Test: Positive on right only Drop Arm Test: Negative  Speed's Test:  Positive on right only  Yergason's Test: Positive on right only Cross Arm Test: Positive on right only  Hawkin's-Kennedy Test: Positive on right only   Neer Test: Positive on right only  O'Brien's Test: Positive on right only    Assessment & Plan:  1. Right shoulder pain- Possible Rotator Cuff Tear? Supraspinatus? AC Joint? - DG Shoulder Right; Future- Ordered to R/O Structure abnormalities  - Take Flexeril as prescribed- cyclobenzaprine (FLEXERIL) 5 MG tablet; Take 1 tablet (5 mg total) by mouth 3 (three) times daily.  Dispense: 90 tablet; Refill: 0 - Ambulatory referral to Orthopedic Surgery- Dr. Percell Miller and Earleen Newport.  Patient wants to go to  them.  2. Allergic Rhinitis -Take Claritin, Zyrtec or Allegra OTC Daily(Choose One)- follow directions on container.  Discussed medication effects and SE's.  Pt agreed to treatment plan. Please keep your follow up appt on 12/15/14.  Zafiro Routson, Stephani Police, PA-C 9:53 PM [facility]

## 2014-11-10 NOTE — Patient Instructions (Signed)
-  Take Flexeril as prescribed for muscle spasms. -Get shoulder x-ray at [facility] when possible. Mendel Ryder will call you with ortho appt date and time.  Continue all other medications as prescribed.   Shoulder Pain The shoulder is the joint that connects your arms to your body. The bones that form the shoulder joint include the upper arm bone (humerus), the shoulder blade (scapula), and the collarbone (clavicle). The top of the humerus is shaped like a ball and fits into a rather flat socket on the scapula (glenoid cavity). A combination of muscles and strong, fibrous tissues that connect muscles to bones (tendons) support your shoulder joint and hold the ball in the socket. Small, fluid-filled sacs (bursae) are located in different areas of the joint. They act as cushions between the bones and the overlying soft tissues and help reduce friction between the gliding tendons and the bone as you move your arm. Your shoulder joint allows a wide range of motion in your arm. This range of motion allows you to do things like scratch your back or throw a ball. However, this range of motion also makes your shoulder more prone to pain from overuse and injury. Causes of shoulder pain can originate from both injury and overuse and usually can be grouped in the following four categories:  Redness, swelling, and pain (inflammation) of the tendon (tendinitis) or the bursae (bursitis).  Instability, such as a dislocation of the joint.  Inflammation of the joint (arthritis).  Broken bone (fracture). HOME CARE INSTRUCTIONS   Apply ice to the sore area.  Put ice in a plastic bag.  Place a towel between your skin and the bag.  Leave the ice on for 15-20 minutes, 3-4 times per day for the first 2 days, or as directed by your health care provider.  Stop using cold packs if they do not help with the pain.  If you have a shoulder sling or immobilizer, wear it as long as your caregiver instructs. Only  remove it to shower or bathe. Move your arm as little as possible, but keep your hand moving to prevent swelling.  Squeeze a soft ball or foam pad as much as possible to help prevent swelling.  Only take over-the-counter or prescription medicines for pain, discomfort, or fever as directed by your caregiver. SEEK MEDICAL CARE IF:   Your shoulder pain increases, or new pain develops in your arm, hand, or fingers.  Your hand or fingers become cold and numb.  Your pain is not relieved with medicines. SEEK IMMEDIATE MEDICAL CARE IF:   Your arm, hand, or fingers are numb or tingling.  Your arm, hand, or fingers are significantly swollen or turn white or blue. MAKE SURE YOU:   Understand these instructions.  Will watch your condition.  Will get help right away if you are not doing well or get worse. Document Released: 08/17/2005 Document Revised: 03/24/2014 Document Reviewed: 10/22/2011 [facility] Patient Information 2015 [place], [place]. This information is not intended to replace advice given to you by your health care provider. Make sure you discuss any questions you have with your health care provider.

## 2014-11-25 ENCOUNTER — Ambulatory Visit (HOSPITAL_COMMUNITY)
Admission: RE | Admit: 2014-11-25 | Discharge: 2014-11-25 | Disposition: A | Discharge status: Home / Self Care | Payer: BLUE CROSS/BLUE SHIELD | Source: Ambulatory Visit | Attending: Physician Assistant | Admitting: Physician Assistant

## 2014-11-25 ENCOUNTER — Encounter (INDEPENDENT_AMBULATORY_CARE_PROVIDER_SITE_OTHER): Payer: Self-pay

## 2014-11-25 DIAGNOSIS — M25511 Pain in right shoulder: Secondary | ICD-10-CM | POA: Insufficient documentation

## 2014-11-25 DIAGNOSIS — M7591 Shoulder lesion, unspecified, right shoulder: Secondary | ICD-10-CM | POA: Diagnosis not present

## 2014-12-15 ENCOUNTER — Ambulatory Visit: Payer: Self-pay | Admitting: Physician Assistant

## 2014-12-25 ENCOUNTER — Ambulatory Visit (INDEPENDENT_AMBULATORY_CARE_PROVIDER_SITE_OTHER): Payer: BLUE CROSS/BLUE SHIELD | Admitting: Physician Assistant

## 2014-12-25 ENCOUNTER — Encounter: Payer: Self-pay | Admitting: Physician Assistant

## 2014-12-25 VITALS — BP 128/72 | HR 80 | Temp 98.1°F | Resp 16 | Ht 67.0 in | Wt 233.0 lb

## 2014-12-25 DIAGNOSIS — Z79899 Other long term (current) drug therapy: Secondary | ICD-10-CM

## 2014-12-25 DIAGNOSIS — R7309 Other abnormal glucose: Secondary | ICD-10-CM

## 2014-12-25 DIAGNOSIS — E559 Vitamin D deficiency, unspecified: Secondary | ICD-10-CM

## 2014-12-25 DIAGNOSIS — R7303 Prediabetes: Secondary | ICD-10-CM

## 2014-12-25 DIAGNOSIS — Z720 Tobacco use: Secondary | ICD-10-CM

## 2014-12-25 DIAGNOSIS — E669 Obesity, unspecified: Secondary | ICD-10-CM

## 2014-12-25 DIAGNOSIS — B351 Tinea unguium: Secondary | ICD-10-CM

## 2014-12-25 DIAGNOSIS — E785 Hyperlipidemia, unspecified: Secondary | ICD-10-CM

## 2014-12-25 DIAGNOSIS — E039 Hypothyroidism, unspecified: Secondary | ICD-10-CM

## 2014-12-25 LAB — HEPATIC FUNCTION PANEL
ALT: 17 U/L (ref 0–35)
AST: 17 U/L (ref 0–37)
Albumin: 3.8 g/dL (ref 3.5–5.2)
Alkaline Phosphatase: 106 U/L (ref 39–117)
BILIRUBIN DIRECT: 0.1 mg/dL (ref 0.0–0.3)
BILIRUBIN TOTAL: 0.4 mg/dL (ref 0.2–1.2)
Indirect Bilirubin: 0.3 mg/dL (ref 0.2–1.2)
Total Protein: 6.4 g/dL (ref 6.0–8.3)

## 2014-12-25 LAB — BASIC METABOLIC PANEL WITH GFR
BUN: 14 mg/dL (ref 6–23)
CHLORIDE: 104 meq/L (ref 96–112)
CO2: 27 meq/L (ref 19–32)
CREATININE: 0.96 mg/dL (ref 0.50–1.10)
Calcium: 9.5 mg/dL (ref 8.4–10.5)
GFR, EST NON AFRICAN AMERICAN: 62 mL/min
GFR, Est African American: 71 mL/min
Glucose, Bld: 92 mg/dL (ref 70–99)
POTASSIUM: 3.7 meq/L (ref 3.5–5.3)
SODIUM: 139 meq/L (ref 135–145)

## 2014-12-25 LAB — LIPID PANEL
Cholesterol: 151 mg/dL (ref 0–200)
HDL: 42 mg/dL (ref >39)
LDL Cholesterol: 75 mg/dL (ref 0–99)
Total CHOL/HDL Ratio: 3.6 Ratio
Triglycerides: 168 mg/dL — ABNORMAL HIGH (ref <150)
VLDL: 34 mg/dL (ref 0–40)

## 2014-12-25 LAB — MAGNESIUM: MAGNESIUM: 2 mg/dL (ref 1.5–2.5)

## 2014-12-25 MED ORDER — PHENTERMINE HCL 37.5 MG PO TABS: 37.5000 mg | ORAL_TABLET | Freq: Every day | ORAL | Status: DC

## 2014-12-25 MED ORDER — TERBINAFINE HCL 250 MG PO TABS: 250.0000 mg | ORAL_TABLET | Freq: Every day | ORAL | Status: DC

## 2014-12-25 NOTE — Patient Instructions (Signed)
.Lamisil take 1 month on and 1 month off.   Phentermine  While taking the medication we may ask that you come into the office once a month or once every 2-3 months to monitor your weight, blood pressure, and heart rate. In addition we can help answer your questions about diet, exercise, and help you every step of the way with your weight loss journey. Sometime it is helpful if you bring in a food diary or use an app on your phone such as myfitnesspal to record your calorie intake, especially in the beginning.   You can start out on 1/3 to 1/2 a pill in the morning and if you are tolerating it well you can increase to one pill daily. I also have some patients that take 1/3 or 1/2 at lunch to help prevent night time eating.  This medication is cheapest CASH pay at [facility] and you do NOT need a membership to get meds from there.  Before you even begin to attack a weight-loss plan, it pays to remember this: You are not fat. You have fat. Losing weight isn't about blame or shame; it's simply another achievement to accomplish. Dieting is like any other skill-you have to buckle down and work at it. As long as you act in a smart, reasonable way, you'll ultimately get where you want to be. Here are some weight loss pearls for you.  1. It's Not a Diet. It's a Lifestyle Thinking of a diet as something you're on and suffering through only for the short term doesn't work. To shed weight and keep it off, you need to make permanent changes to the way you eat. It's OK to indulge occasionally, of course, but if you cut calories temporarily and then revert to your old way of eating, you'll gain back the weight quicker than you can say yo-yo. Use it to lose it. Research shows that one of the best predictors of long-term weight loss is how many pounds you drop in the first month. For that reason, nutritionists often suggest being stricter for the first two weeks of your new eating strategy to build momentum. Cut out  added sugar and alcohol and avoid unrefined carbs. After that, figure out how you can reincorporate them in a way that's healthy and maintainable.  2. There's a Right Way to Exercise Working out burns calories and fat and boosts your metabolism by building muscle. But those trying to lose weight are notorious for overestimating the number of calories they burn and underestimating the amount they take in. Unfortunately, your system is biologically programmed to hold on to extra pounds and that means when you start exercising, your body senses the deficit and ramps up its hunger signals. If you're not diligent, you'll eat everything you burn and then some. Use it to lose it. Cardio gets all the exercise glory, but strength and interval training are the real heroes. They help you build lean muscle, which in turn increases your metabolism and calorie-burning ability 3. Don't Overreact to Mild Hunger Some people have a hard time losing weight because of hunger anxiety. To them, being hungry is bad-something to be avoided at all costs-so they carry snacks with them and eat when they don't need to. Others eat because they're stressed out or bored. While you never want to get to the point of being ravenous (that's when bingeing is likely to happen), a hunger pang, a craving, or the fact that it's 3:00 p.m. should not send  you racing for the vending machine or obsessing about the energy bar in your purse. Ideally, you should put off eating until your stomach is growling and it's difficult to concentrate.  Use it to lose it. When you feel the urge to eat, use the HALT method. Ask yourself, Am I really hungry? Or am I angry or anxious, lonely or bored, or tired? If you're still not certain, try the apple test. If you're truly hungry, an apple should seem delicious; if it doesn't, something else is going on. Or you can try drinking water and making yourself busy, if you are still hungry try a healthy snack.  4. Not All  Calories Are Created Equal The mechanics of weight loss are pretty simple: Take in fewer calories than you use for energy. But the kind of food you eat makes all the difference. Processed food that's high in saturated fat and refined starch or sugar can cause inflammation that disrupts the hormone signals that tell your brain you're full. The result: You eat a lot more.  Use it to lose it. Clean up your diet. Swap in whole, unprocessed foods, including vegetables, lean protein, and healthy fats that will fill you up and give you the biggest nutritional bang for your calorie buck. In a few weeks, as your brain starts receiving regular hunger and fullness signals once again, you'll notice that you feel less hungry overall and naturally start cutting back on the amount you eat.  5. Protein, Produce, and Plant-Based Fats Are Your Weight-Loss Trinity Here's why eating the three Ps regularly will help you drop pounds. Protein fills you up. You need it to build lean muscle, which keeps your metabolism humming so that you can torch more fat. People in a weight-loss program who ate double the recommended daily allowance for protein (about 110 grams for a 150-pound woman) lost 70 percent of their weight from fat, while people who ate the RDA lost only about 40 percent, one study found. Produce is packed with filling fiber. "It's very difficult to consume too many calories if you're eating a lot of vegetables. Example: Three cups of broccoli is a lot of food, yet only 93 calories. (Fruit is another story. It can be easy to overeat and can contain a lot of calories from sugar, so be sure to monitor your intake.) Plant-based fats like olive oil and those in avocados and nuts are healthy and extra satiating.  Use it to lose it. Aim to incorporate each of the three Ps into every meal and snack. People who eat protein throughout the day are able to keep weight off, according to a study in the [place] of Clinical  Nutrition. In addition to meat, poultry and seafood, good sources are beans, lentils, eggs, tofu, and yogurt. As for fat, keep portion sizes in check by measuring out salad dressing, oil, and nut butters (shoot for one to two tablespoons). Finally, eat veggies or a little fruit at every meal. People who did that consumed 308 fewer calories but didn't feel any hungrier than when they didn't eat more produce.  7. How You Eat Is As Important As What You Eat In order for your brain to register that you're full, you need to focus on what you're eating. Sit down whenever you eat, preferably at a table. Turn off the TV or computer, put down your phone, and look at your food. Smell it. Chew slowly, and don't put another bite on your fork until you swallow.  When women ate lunch this attentively, they consumed 30 percent less when snacking later than those who listened to an audiobook at lunchtime, according to a study in the [place] of Nutrition. 8. Weighing Yourself Really Works The scale provides the best evidence about whether your efforts are paying off. Seeing the numbers tick up or down or stagnate is motivation to keep going-or to rethink your approach. A 2015 study at [facility] found that daily weigh-ins helped people lose more weight, keep it off, and maintain that loss, even after two years. Use it to lose it. Step on the scale at the same time every day for the best results. If your weight shoots up several pounds from one weigh-in to the next, don't freak out. Eating a lot of salt the night before or having your period is the likely culprit. The number should return to normal in a day or two. It's a steady climb that you need to do something about. 9. Too Much Stress and Too Little Sleep Are Your Enemies When you're tired and frazzled, your body cranks up the production of cortisol, the stress hormone that can cause carb cravings. Not getting enough sleep also boosts your levels of  ghrelin, a hormone associated with hunger, while suppressing leptin, a hormone that signals fullness and satiety. People on a diet who slept only five and a half hours a night for two weeks lost 55 percent less fat and were hungrier than those who slept eight and a half hours, according to a study in the [place]. Use it to lose it. Prioritize sleep, aiming for seven hours or more a night, which research shows helps lower stress. And make sure you're getting quality zzz's. If a snoring spouse or a fidgety cat wakes you up frequently throughout the night, you may end up getting the equivalent of just four hours of sleep, according to a study from [facility]. Keep pets out of the bedroom, and use a white-noise app to drown out snoring. 10. You Will Hit a plateau-And You Can Bust Through It As you slim down, your body releases much less leptin, the fullness hormone.  If you're not strength training, start right now. Building muscle can raise your metabolism to help you overcome a plateau. To keep your body challenged and burning calories, incorporate new moves and more intense intervals into your workouts or add another sweat session to your weekly routine. Alternatively, cut an extra 100 calories or so a day from your diet. Now that you've lost weight, your body simply doesn't need as much fuel.   Ways to cut 100 calories  1. Eat your eggs with hot sauce OR salsa instead of cheese.  Eggs are great for breakfast, but many people consider eggs and cheese to be BFFs. Instead of cheese-1 oz. of cheddar has 114 calories-top your eggs with hot sauce, which contains no calories and helps with satiety and metabolism. Salsa is also a great option!!  2. Top your toast, waffles or pancakes with mashed berries instead of jelly or syrup. Half a cup of berries-fresh, frozen or thawed-has about 40 calories, compared with 2 tbsp. of maple syrup or jelly, which both have about 100  calories. The berries will also give you a good punch of fiber, which helps keep you full and satisfied and won't spike blood sugar quickly like the jelly or syrup. 3. Swap the non-fat latte for black coffee with a splash of half-and-half. Contrary to  its name, that non-fat latte has 130 calories and a startling 19g of carbohydrates per 16 oz. serving. Replacing that 'light' drinkable dessert with a black coffee with a splash of half-and-half saves you more than 100 calories per 16 oz. serving. 4. Sprinkle salads with freeze-dried raspberries instead of dried cranberries. If you want a sweet addition to your nutritious salad, stay away from dried cranberries. They have a whopping 130 calories per  cup and 30g carbohydrates. Instead, sprinkle freeze-dried raspberries guilt-free and save more than 100 calories per  cup serving, adding 3g of belly-filling fiber. 5. Go for mustard in place of mayo on your sandwich. Mustard can add really nice flavor to any sandwich, and there are tons of varieties, from spicy to honey. A serving of mayo is 95 calories, versus 10 calories in a serving of mustard. 6. Choose a DIY salad dressing instead of the store-bought kind. Mix Dijon or whole grain mustard with low-fat Kefir or red wine vinegar and garlic. 7. Use hummus as a spread instead of a dip. Use hummus as a spread on a high-fiber cracker or tortilla with a sandwich and save on calories without sacrificing taste. 8. Pick just one salad "accessory." Salad isn't automatically a calorie winner. It's easy to over-accessorize with toppings. Instead of topping your salad with nuts, avocado and cranberries (all three will clock in at 313 calories), just pick one. The next day, choose a different accessory, which will also keep your salad interesting. You don't wear all your jewelry every day, right? 9. Ditch the white pasta in favor of spaghetti squash. One cup of cooked spaghetti squash has about 40 calories, compared  with traditional spaghetti, which comes with more than 200. Spaghetti squash is also nutrient-dense. It's a good source of fiber and Vitamins A and C, and it can be eaten just like you would eat pasta-with a great tomato sauce and [place] meatballs or with pesto, tofu and spinach, for example. 10. Dress up your chili, soups and stews with non-fat [place] yogurt instead of sour cream. Just a 'dollop' of sour cream can set you back 115 calories and a whopping 12g of fat-seven of which are of the artery-clogging variety. Added bonus: [place] yogurt is packed with muscle-building protein, calcium and B Vitamins. 11. Mash cauliflower instead of mashed potatoes. One cup of traditional mashed potatoes-in all their creamy goodness-has more than 200 calories, compared to mashed cauliflower, which you can typically eat for less than 100 calories per 1 cup serving. Cauliflower is a great source of the antioxidant indole-3-carbinol (I3C), which may help reduce the risk of some cancers, like breast cancer. 12. Ditch the ice cream sundae in favor of a [place] yogurt parfait. Instead of a cup of ice cream or fro-yo for dessert, try 1 cup of nonfat Greek yogurt topped with fresh berries and a sprinkle of cacao nibs. Both toppings are packed with antioxidants, which can help reduce cellular inflammation and oxidative damage. And the comparison is a no-brainer: One cup of ice cream has about 275 calories; one cup of frozen yogurt has about 230; and a cup of Greek yogurt has just 130, plus twice the protein, so you're less likely to return to the freezer for a second helping. 13. Put olive oil in a spray container instead of using it directly from the bottle. Each tablespoon of olive oil is 120 calories and 15g of fat. Use a mister instead of pouring it straight into the pan or onto a salad. This  allows for portion control and will save you more than 100 calories. 14. When baking, substitute canned pumpkin for butter or oil. Canned  pumpkin-not pumpkin pie mix-is loaded with Vitamin A, which is important for skin and eye health, as well as immunity. And the comparisons are pretty crazy:  cup of canned pumpkin has about 40 calories, compared to butter or oil, which has more than 800 calories. Yes, 800 calories. Applesauce and mashed banana can also serve as good substitutions for butter or oil, usually in a 1:1 ratio. 15. Top casseroles with high-fiber cereal instead of breadcrumbs. Breadcrumbs are typically made with white bread, while breakfast cereals contain 5-9g of fiber per serving. Not only will you save more than 150 calories per  cup serving, the swap will also keep you more full and you'll get a metabolism boost from the added fiber. 16. Snack on pistachios instead of macadamia nuts. Believe it or not, you get the same amount of calories from 35 pistachios (100 calories) as you would from only five macadamia nuts. 17. Chow down on kale chips rather than potato chips. This is my favorite 'don't knock it 'till you try it' swap. Kale chips are so easy to make at home, and you can spice them up with a little grated parmesan or chili powder. Plus, they're a mere fraction of the calories of potato chips, but with the same crunch factor we crave so often. 18. Add seltzer and some fruit slices to your cocktail instead of soda or fruit juice. One cup of soda or fruit juice can pack on as much as 140 calories. Instead, use seltzer and fruit slices. The fruit provides valuable phytochemicals, such as flavonoids and anthocyanins, which help to combat cancer and stave off the aging process.

## 2014-12-25 NOTE — Progress Notes (Signed)
Assessment and Plan:  Hypertension: Continue medication, monitor blood pressure at home. Continue DASH diet.  Reminder to go to the ER if any CP, SOB, nausea, dizziness, severe HA, changes vision/speech, left arm numbness and tingling, and jaw pain. Cholesterol: Continue diet and exercise. Check cholesterol.  Pre-diabetes-Continue diet and exercise. Check A1C Vitamin D Def- check level and continue medications.  Obesity with co morbidities- long discussion about weight loss, diet, and exercise Hypothyroidism-check TSH level, continue medications the same, reminded to take on an empty stomach 30-8mins before food.  Onychomycosis- lamisil 1 month on, 1 month off for 6 months.    Continue diet and meds as discussed. Further disposition pending results of labs.  HPI 69 y.o. female  presents for 3 month follow up with hypertension, hyperlipidemia, prediabetes and vitamin D.  Her blood pressure has been controlled at home, today their BP is BP: 128/72 mmHg  She does not workout. She denies chest pain, shortness of breath, dizziness.  She is on cholesterol medication and denies myalgias. Her cholesterol is at goal. The cholesterol last visit was:   Lab Results  Component Value Date   CHOL 146 08/19/2014   HDL 45 08/19/2014   LDLCALC 58 08/19/2014   TRIG 215* 08/19/2014   CHOLHDL 3.2 08/19/2014   She has been working on diet and exercise for prediabetes, and denies paresthesia of the feet, polydipsia, polyuria and visual disturbances. Last A1C in the office was:  Lab Results  Component Value Date   HGBA1C 6.4* 08/19/2014  Patient is on Vitamin D supplement.   Lab Results  Component Value Date   VD25OH 67 08/19/2014  She was put on lamisil for onychomycosis of toenails, she states it is unchanged, has been on it 3 months.  BMI is Body mass index is 36.48 kg/(m^2)., she is working on diet and exercise, she has not been on phentermine. Wt Readings from Last 3 Encounters:  12/25/14 233 lb  (105.688 kg)  11/10/14 232 lb (105.235 kg)  10/20/14 232 lb (105.235 kg)   She is on thyroid medication. Her medication was not changed last visit.   Lab Results  Component Value Date   TSH 2.016 08/19/2014  .   Current Medications:  Current Outpatient Prescriptions on File Prior to Visit  Medication Sig Dispense Refill  . aspirin 81 MG tablet Take 81 mg by mouth daily.    Marland Kitchen atorvastatin (LIPITOR) 40 MG tablet TAKE 1 TABLET AT BEDTIME   FOR CHOLESTEROL 90 tablet 1  . b complex vitamins tablet Take 1 tablet by mouth daily.    Marland Kitchen CALCIUM PO Take 1,200 mg by mouth daily.     Marland Kitchen diltiazem (DILACOR XR) 240 MG 24 hr capsule TAKE 1 CAPSULE EVERY DAY 90 capsule 3  . esomeprazole (NEXIUM) 40 MG capsule TAKE 1 CAPSULE DAILY 90 capsule 1  . estradiol (ESTRACE) 0.5 MG tablet TAKE 1 TABLET EVERY DAY 90 tablet 3  . furosemide (LASIX) 20 MG tablet TAKE 1 TABLET TWICE A DAY 180 tablet 3  . levothyroxine (SYNTHROID, LEVOTHROID) 125 MCG tablet Take 125 mcg by mouth daily.    . meloxicam (MOBIC) 7.5 MG tablet TAKE 1 TABLET DAILY FOR    PAIN WITH FOOD 90 tablet 3  . Multiple Vitamin (MULTIVITAMIN) tablet Take 1 tablet by mouth daily.    . nortriptyline (PAMELOR) 25 MG capsule Take 25 mg by mouth 2 (two) times daily.     . phentermine (ADIPEX-P) 37.5 MG tablet Take 1 tablet (37.5 mg  total) by mouth daily before breakfast. 30 tablet 2  . pregabalin (LYRICA) 75 MG capsule Take 75 mg by mouth 3 (three) times daily.    Marland Kitchen SYNTHROID 125 MCG tablet TAKE 1 TABLET DAILY FOR    THYROID 90 tablet 3  . terbinafine (LAMISIL) 250 MG tablet Take 1 tablet (250 mg total) by mouth daily. 90 tablet 0   No current facility-administered medications on file prior to visit.   Medical History:  Past Medical History  Diagnosis Date  . Varicose veins   . Obesity   . Fatigue   . Chest tightness   . Myalgia   . GERD (gastroesophageal reflux disease)   . Ulcer   . Hyperlipidemia   . Hypothyroid   . Anemia   . Prediabetes     Allergies:  Allergies  Allergen Reactions  . Levaquin [Levofloxacin In D5w]   . Macrobid [Nitrofurantoin Macrocrystal]   . Penicillins     REACTION: rash  . Procaine Hcl     REACTION: difficulty breathing  . Sulfonamide Derivatives     REACTION: hives  . Tetanus Toxoid     REACTION: arm swells    Review of Systems:  Review of Systems  Constitutional: Negative.   HENT: Negative.   Eyes: Negative.   Respiratory: Negative.   Cardiovascular: Negative.   Gastrointestinal: Negative.   Genitourinary: Negative.   Musculoskeletal: Positive for joint pain (right shoulder pain, better with injection). Negative for myalgias, back pain, falls and neck pain.  Skin: Negative.   Neurological: Negative.   Endo/Heme/Allergies: Negative.   Psychiatric/Behavioral: Negative.     Family history- Review and unchanged Social history- Review and unchanged Physical Exam: BP 128/72 mmHg  Pulse 80  Temp(Src) 98.1 F (36.7 C)  Resp 16  Ht 5\' 7"  (1.702 m)  Wt 233 lb (105.688 kg)  BMI 36.48 kg/m2 Wt Readings from Last 3 Encounters:  12/25/14 233 lb (105.688 kg)  11/10/14 232 lb (105.235 kg)  10/20/14 232 lb (105.235 kg)   General Appearance: Well nourished, in no apparent distress. Eyes: PERRLA, EOMs, conjunctiva no swelling or erythema Sinuses: No Frontal/maxillary tenderness ENT/Mouth: Ext aud canals clear, TMs without erythema, bulging. No erythema, swelling, or exudate on post pharynx.  Tonsils not swollen or erythematous. Hearing normal.  Neck: Supple, thyroid normal.  Respiratory: Respiratory effort normal, BS equal bilaterally without rales, rhonchi, wheezing or stridor.  Cardio: RRR with no MRGs. Brisk peripheral pulses without edema.  Abdomen: Soft, + BS,  Non tender, no guarding, rebound, hernias, masses. Lymphatics: Non tender without lymphadenopathy.  Musculoskeletal: Full ROM, 5/5 strength, Normal gait Skin: Warm, dry without rashes, lesions, ecchymosis.  Neuro: Cranial  nerves intact. Normal muscle tone, no cerebellar symptoms. Psych: Awake and oriented X 3, normal affect, Insight and Judgment appropriate.    Vicie Mutters, PA-C 4:32 PM [facility]

## 2014-12-26 LAB — CBC WITH DIFFERENTIAL/PLATELET
BASOS ABS: 0 10*3/uL (ref 0.0–0.1)
BASOS PCT: 0 % (ref 0–1)
EOS ABS: 0.2 10*3/uL (ref 0.0–0.7)
Eosinophils Relative: 2 % (ref 0–5)
HEMATOCRIT: 38.6 % (ref 36.0–46.0)
HEMOGLOBIN: 12.7 g/dL (ref 12.0–15.0)
LYMPHS ABS: 3.5 10*3/uL (ref 0.7–4.0)
Lymphocytes Relative: 29 % (ref 12–46)
MCH: 26.7 pg (ref 26.0–34.0)
MCHC: 32.9 g/dL (ref 30.0–36.0)
MCV: 81.1 fL (ref 78.0–100.0)
MONOS PCT: 7 % (ref 3–12)
MPV: 10 fL (ref 8.6–12.4)
Monocytes Absolute: 0.8 10*3/uL (ref 0.1–1.0)
NEUTROS ABS: 7.5 10*3/uL (ref 1.7–7.7)
Neutrophils Relative %: 62 % (ref 43–77)
Platelets: 341 10*3/uL (ref 150–400)
RBC: 4.76 MIL/uL (ref 3.87–5.11)
RDW: 15.5 % (ref 11.5–15.5)
WBC: 12.1 10*3/uL — ABNORMAL HIGH (ref 4.0–10.5)

## 2014-12-26 LAB — HEMOGLOBIN A1C
Hgb A1c MFr Bld: 6.7 % — ABNORMAL HIGH (ref <5.7)
MEAN PLASMA GLUCOSE: 146 mg/dL — AB (ref <117)

## 2014-12-26 LAB — TSH: TSH: 0.501 u[IU]/mL (ref 0.350–4.500)

## 2014-12-26 LAB — VITAMIN D 25 HYDROXY (VIT D DEFICIENCY, FRACTURES): VIT D 25 HYDROXY: 53 ng/mL (ref 30–100)

## 2014-12-26 LAB — INSULIN, FASTING: INSULIN FASTING, SERUM: 38.6 u[IU]/mL — AB (ref 2.0–19.6)

## 2015-02-15 ENCOUNTER — Other Ambulatory Visit: Payer: Self-pay | Admitting: Physician Assistant

## 2015-02-19 ENCOUNTER — Other Ambulatory Visit: Payer: Self-pay | Admitting: Physician Assistant

## 2015-03-30 ENCOUNTER — Other Ambulatory Visit: Payer: Self-pay

## 2015-03-30 MED ORDER — PREGABALIN 75 MG PO CAPS: 75.0000 mg | ORAL_CAPSULE | Freq: Three times a day (TID) | ORAL | Status: DC

## 2015-04-03 ENCOUNTER — Other Ambulatory Visit: Payer: Self-pay

## 2015-04-03 MED ORDER — CYCLOBENZAPRINE HCL 10 MG PO TABS: ORAL_TABLET | ORAL | Status: DC

## 2015-04-23 ENCOUNTER — Other Ambulatory Visit: Payer: Self-pay | Admitting: *Deleted

## 2015-04-23 MED ORDER — ATORVASTATIN CALCIUM 40 MG PO TABS: ORAL_TABLET | ORAL | Status: DC

## 2015-05-14 ENCOUNTER — Encounter: Payer: Self-pay | Admitting: Physician Assistant

## 2015-06-02 ENCOUNTER — Encounter: Payer: Self-pay | Admitting: Physician Assistant

## 2015-06-02 ENCOUNTER — Ambulatory Visit (INDEPENDENT_AMBULATORY_CARE_PROVIDER_SITE_OTHER): Payer: BLUE CROSS/BLUE SHIELD | Admitting: Physician Assistant

## 2015-06-02 VITALS — BP 130/70 | HR 80 | Temp 97.7°F | Resp 16 | Ht 67.0 in | Wt 237.0 lb

## 2015-06-02 DIAGNOSIS — I872 Venous insufficiency (chronic) (peripheral): Secondary | ICD-10-CM

## 2015-06-02 DIAGNOSIS — Z87891 Personal history of nicotine dependence: Secondary | ICD-10-CM

## 2015-06-02 DIAGNOSIS — Z23 Encounter for immunization: Secondary | ICD-10-CM

## 2015-06-02 DIAGNOSIS — R7309 Other abnormal glucose: Secondary | ICD-10-CM

## 2015-06-02 DIAGNOSIS — K648 Other hemorrhoids: Secondary | ICD-10-CM

## 2015-06-02 DIAGNOSIS — Z79899 Other long term (current) drug therapy: Secondary | ICD-10-CM

## 2015-06-02 DIAGNOSIS — D649 Anemia, unspecified: Secondary | ICD-10-CM

## 2015-06-02 DIAGNOSIS — R6889 Other general symptoms and signs: Secondary | ICD-10-CM

## 2015-06-02 DIAGNOSIS — E559 Vitamin D deficiency, unspecified: Secondary | ICD-10-CM

## 2015-06-02 DIAGNOSIS — E785 Hyperlipidemia, unspecified: Secondary | ICD-10-CM

## 2015-06-02 DIAGNOSIS — E669 Obesity, unspecified: Secondary | ICD-10-CM

## 2015-06-02 DIAGNOSIS — Z0001 Encounter for general adult medical examination with abnormal findings: Secondary | ICD-10-CM

## 2015-06-02 DIAGNOSIS — K2100 Gastro-esophageal reflux disease with esophagitis, without bleeding: Secondary | ICD-10-CM

## 2015-06-02 DIAGNOSIS — E039 Hypothyroidism, unspecified: Secondary | ICD-10-CM

## 2015-06-02 DIAGNOSIS — N182 Chronic kidney disease, stage 2 (mild): Secondary | ICD-10-CM

## 2015-06-02 DIAGNOSIS — K5730 Diverticulosis of large intestine without perforation or abscess without bleeding: Secondary | ICD-10-CM

## 2015-06-02 DIAGNOSIS — G9009 Other idiopathic peripheral autonomic neuropathy: Secondary | ICD-10-CM | POA: Insufficient documentation

## 2015-06-02 DIAGNOSIS — I10 Essential (primary) hypertension: Secondary | ICD-10-CM

## 2015-06-02 DIAGNOSIS — R7303 Prediabetes: Secondary | ICD-10-CM

## 2015-06-02 NOTE — Progress Notes (Signed)
Complete Physical   Assessment and Plan: 1. Prediabetes Discussed general issues about diabetes pathophysiology and management., Educational material distributed., Suggested low cholesterol diet., Encouraged aerobic exercise., Discussed foot care., Reminded to get yearly retinal exam. If DM this visit will switch to Dm diagnosis, LONG talk about risk of DM - CBC with Differential/Platelet - BASIC METABOLIC PANEL WITH GFR - Hepatic function panel - Hemoglobin A1c - Insulin, fasting - Urinalysis, Routine w reflex microscopic (not at [facility]) - Microalbumin / creatinine urine ratio - EKG 12-Lead - HM DIABETES FOOT EXAM  2. Hypothyroidism, unspecified hypothyroidism type Hypothyroidism-check TSH level, continue medications the same, reminded to take on an empty stomach 30-40mins before food.  - TSH  3. Hyperlipidemia -continue medications, check lipids, decrease fatty foods, increase activity.  - Lipid panel  4. Obesity Obesity with co morbidities- long discussion about weight loss, diet, and exercise  5. Anemia, unspecified anemia type - Iron and TIBC - Ferritin - Vitamin B12  6. Gastroesophageal reflux disease with esophagitis Continue PPI/H2 blocker, diet discussed  7. Diverticulosis of large intestine without hemorrhage monitor  8. Internal hemorrhoids remission  9. Chronic venous insufficiency Varicose veins- weight loss discussed, continue compression stockings and elevation  10. History of tobacco use Will get CXR next year - EKG 12-Lead  11. CKD (chronic kidney disease) stage 2, GFR 60-89 ml/min Increase fluids, avoid NSAIDS, monitor sugars, will monitor  12. Peripheral autonomic neuropathy of unknown cause Continue medications, get better control of sugar  13. Vitamin D deficiency - Vit D  25 hydroxy (rtn osteoporosis monitoring)  14. Medication management - Magnesium  Discussed med's effects and SE's. Screening labs and tests as requested with regular  follow-up as recommended.  HPI 69 y.o. female  presents for a complete physical. Her blood pressure has been controlled at home, today their BP is BP: 130/70 mmHg She does not workout. She denies chest pain, shortness of breath, dizziness.  She is on cholesterol medication, lipitor 40mg  QD and denies myalgias. Her cholesterol is at goal. The cholesterol last visit was:   Lab Results  Component Value Date   CHOL 151 12/25/2014   HDL 42 12/25/2014   LDLCALC 75 12/25/2014   TRIG 168* 12/25/2014   CHOLHDL 3.6 12/25/2014   She has been working on diet and exercise for prediabetes but last visit she was in the preDM range, and denies polydipsia and polyuria. CKD stage 2 due to lasix/HTN. She has bilateral paraesthsias, idopathic and takes lyrica/elavil. Last A1C in the office was:  Lab Results  Component Value Date   HGBA1C 6.7* 12/25/2014   Patient is on Vitamin D supplement.   Lab Results  Component Value Date   VD25OH 70 12/25/2014   She is on thyroid medication, takes 161mcg QD. Her medication was not changed last visit. Patient denies nervousness and weight changes.  Lab Results  Component Value Date   TSH 0.501 12/25/2014   She is on estrogen and bASA which she states is very helpful.  She has been on lamisil for oncyomycosis.  BMI is Body mass index is 37.11 kg/(m^2)., she is working on diet and exercise. Wt Readings from Last 3 Encounters:  06/02/15 237 lb (107.502 kg)  12/25/14 233 lb (105.688 kg)  11/10/14 232 lb (105.235 kg)     Current Medications:  Current Outpatient Prescriptions on File Prior to Visit  Medication Sig Dispense Refill  . amitriptyline (ELAVIL) 25 MG tablet TAKE 1 TABLET TWICE A DAY 180 tablet 3  .  aspirin 81 MG tablet Take 81 mg by mouth daily.    Marland Kitchen atorvastatin (LIPITOR) 40 MG tablet TAKE 1 TABLET AT BEDTIME   FOR CHOLESTEROL 90 tablet 1  . b complex vitamins tablet Take 1 tablet by mouth daily.    Marland Kitchen CALCIUM PO Take 1,200 mg by mouth daily.      . cyclobenzaprine (FLEXERIL) 10 MG tablet Take 1/2 to 1 tablet three times daily as needed for muscle spasm 30 tablet 0  . diltiazem (DILACOR XR) 240 MG 24 hr capsule TAKE 1 CAPSULE EVERY DAY 90 capsule 3  . esomeprazole (NEXIUM) 40 MG capsule TAKE 1 CAPSULE DAILY 90 capsule 1  . estradiol (ESTRACE) 0.5 MG tablet TAKE 1 TABLET DAILY 90 tablet 3  . furosemide (LASIX) 20 MG tablet TAKE 1 TABLET TWICE A DAY 180 tablet 0  . meloxicam (MOBIC) 7.5 MG tablet TAKE 1 TABLET DAILY FOR    PAIN WITH FOOD 90 tablet 3  . Multiple Vitamin (MULTIVITAMIN) tablet Take 1 tablet by mouth daily.    . nortriptyline (PAMELOR) 25 MG capsule Take 25 mg by mouth 2 (two) times daily.     . phentermine (ADIPEX-P) 37.5 MG tablet Take 1 tablet (37.5 mg total) by mouth daily before breakfast. 30 tablet 1  . pregabalin (LYRICA) 75 MG capsule Take 1 capsule (75 mg total) by mouth 3 (three) times daily. 270 capsule 3  . SYNTHROID 125 MCG tablet TAKE 1 TABLET DAILY FOR    THYROID 90 tablet 3   No current facility-administered medications on file prior to visit.   Health Maintenance:   Immunization History  Administered Date(s) Administered  . Influenza, High Dose Seasonal PF 08/19/2014  . Pneumococcal Conjugate-13 05/12/2014  . Pneumococcal Polysaccharide-23 09/05/2007   Tetanus: ALLERGY Pneumovax: 2008 DUE Prevnar 2015 Flu vaccine: 2015 yearly Zostavax: N/A will check with insurance Pap: 02/2011 normal declines another MGM: 04/2014 normal at [place], US breast 2015 DEXA: 07/2012 normal Colonoscopy: 2011  Due 2021 EGD: n/a CT AB 2011 Heart cath June 1999, no blockages, coronary spasms  Patient Care Team: Unk Pinto, MD as PCP - General (Internal Medicine) Lafayette Dragon, MD as Consulting Physician (Gastroenterology) Darleen Crocker, MD as Consulting Physician (Ophthalmology) Macarthur Critchley, [place] as Referring Physician (Optometry) Crista Luria, MD as Consulting Physician (Dermatology) Kathryne Hitch, MD as Consulting  Physician (Orthopedic Surgery) Minus Breeding, MD as Consulting Physician (Cardiology)   Allergies:  Allergies  Allergen Reactions  . Levaquin [Levofloxacin In D5w]   . Macrobid [Nitrofurantoin Macrocrystal]   . Penicillins     REACTION: rash  . Procaine Hcl     REACTION: difficulty breathing  . Sulfonamide Derivatives     REACTION: hives  . Tetanus Toxoid     REACTION: arm swells   Medical History:  Past Medical History  Diagnosis Date  . Varicose veins   . Obesity   . Fatigue   . Chest tightness   . Myalgia   . GERD (gastroesophageal reflux disease)   . Ulcer   . Hyperlipidemia   . Hypothyroid   . Anemia   . Prediabetes    Surgical History:  Past Surgical History  Procedure Laterality Date  . Abdominal hysterectomy    . Tonsillectomy and adenoidectomy    . Cataract extraction    . Nasal sinus surgery    . Rotator cuff repair Left more than 7 years ago    Dr. Percell Miller and Dr. Earleen Newport   Family History:  Family History  Problem Relation  Age of Onset  . Coronary artery disease Father 55   Social History:  History  Substance Use Topics  . Smoking status: Former Smoker -- 0.50 packs/day for 46 years    Types: Cigarettes    Quit date: 05/27/2013  . Smokeless tobacco: Not on file  . Alcohol Use: Not on file   Review of Systems  Constitutional: Negative.   HENT: Negative.   Eyes: Negative.   Respiratory: Negative.   Cardiovascular: Positive for leg swelling. Negative for chest pain, palpitations, orthopnea, claudication and PND.  Gastrointestinal: Positive for constipation. Negative for heartburn, nausea, vomiting, abdominal pain, diarrhea, blood in stool and melena.  Genitourinary: Negative.   Musculoskeletal: Positive for myalgias, joint pain and falls (x 1). Negative for back pain and neck pain.  Skin: Negative.   Neurological: Positive for sensory change (bilateral legs). Negative for dizziness, tingling, tremors, speech change, focal weakness, seizures  and loss of consciousness.  Endo/Heme/Allergies: Negative.   Psychiatric/Behavioral: Negative.     Physical Exam: Estimated body mass index is 37.11 kg/(m^2) as calculated from the following:   Height as of this encounter: 5\' 7"  (1.702 m).   Weight as of this encounter: 237 lb (107.502 kg). BP 130/70 mmHg  Pulse 80  Temp(Src) 97.7 F (36.5 C)  Resp 16  Ht 5\' 7"  (1.702 m)  Wt 237 lb (107.502 kg)  BMI 37.11 kg/m2 General Appearance: Well nourished, in no apparent distress. Eyes: PERRLA, EOMs, conjunctiva no swelling or erythema, normal fundi and vessels. Sinuses: No Frontal/maxillary tenderness ENT/Mouth: Ext aud canals clear, normal light reflex with TMs without erythema, bulging.  Good dentition. No erythema, swelling, or exudate on post pharynx. Tonsils not swollen or erythematous. Hearing normal.  Neck: Supple, thyroid normal. No bruits Respiratory: Respiratory effort normal, BS equal bilaterally without rales, rhonchi, wheezing or stridor. Cardio: RRR without murmurs, rubs or gallops. Brisk peripheral pulses with 2+ edema.  Chest: symmetric, with normal excursions and percussion. Breasts: Symmetric, without lumps, nipple discharge, retractions. Abdomen: Soft, +BS. Non tender, no guarding, rebound, hernias, masses, or organomegaly. .  Lymphatics: Non tender without lymphadenopathy.  Genitourinary: defer Musculoskeletal: Full ROM all peripheral extremities,5/5 strength, and normal gait. Skin: Warm, dry without rashes, lesions, ecchymosis.  Neuro: Cranial nerves intact, reflexes equal bilaterally. Normal muscle tone, no cerebellar symptoms. Sensation decreased bilateral feet to mallelous Psych: Awake and oriented X 3, normal affect, Insight and Judgment appropriate.   EKG: WNL, PRWP, no ST changes. AORTA SCAN: defer   Vicie Mutters 3:49 PM

## 2015-06-02 NOTE — Patient Instructions (Addendum)
Benefiber is good for constipation/diarrhea/irritable bowel syndrome, it helps with weight loss and can help lower your bad cholesterol. Please do 1-2 TBSP in the morning in water, coffee, or tea. It can take up to a month before you can see a difference with your bowel movements. It is cheapest from [employer], [employer], [employer].   Cut lasix/furosemide down to 1 pill a day.   Check with your insurance about shingles vaccine  HOME CARE INSTRUCTIONS   Do not stand or sit in one position for long periods of time. Do not sit with your legs crossed. Rest with your legs raised during the day.  Your legs have to be higher than your heart so that gravity will force the valves to open, so please really elevate your legs.   Wear elastic stockings or support hose. Do not wear other tight, encircling garments around the legs, pelvis, or waist.  [employer]  has a wide variety of well priced compression stockings. [place], [place] [address] [phone]  Walk as much as possible to increase blood flow.  Raise the foot of your bed at night with 2-inch blocks. SEEK MEDICAL CARE IF:   The skin around your ankle starts to break down.  You have pain, redness, tenderness, or hard swelling developing in your leg over a vein.  You are uncomfortable due to leg pain. Document Released: 08/17/2005 Document Revised: 01/30/2012 Document Reviewed: 01/03/2011 [facility] Patient Information 2014 [place].  Diabetes is a very complicated disease...lets simplify it.  An easy way to look at it to understand the complications is if you think of the extra sugar floating in your blood stream as glass shards floating through your blood stream.    Diabetes affects your small vessels first: 1) The glass shards (sugar) scraps down the tiny blood vessels in your eyes and lead to diabetic retinopathy, the leading cause of blindness in the [place]. Diabetes is the leading cause of newly diagnosed adult (48 to  67 years of age) blindness in the [place].  2) The glass shards scratches down the tiny vessels of your legs leading to nerve damage called neuropathy and can lead to amputations of your feet. More than 60% of all non-traumatic amputations of lower limbs occur in people with diabetes.  3) Over time the small vessels in your brain are shredded and closed off, individually this does not cause any problems but over a long period of time many of the small vessels being blocked can lead to Vascular Dementia.   4) Your kidney's are a filter system and have a "net" that keeps certain things in the body and lets bad things out. Sugar shreds this net and leads to kidney damage and eventually failure. Decreasing the sugar that is destroying the net and certain blood pressure medications can help stop or decrease progression of kidney disease. Diabetes was the primary cause of kidney failure in 44 percent of all new cases in 2011.  5) Diabetes also destroys the small vessels in your penis that lead to erectile dysfunction. Eventually the vessels are so damaged that you may not be responsive to cialis or viagra.   Diabetes and your large vessels: Your larger vessels consist of your coronary arteries in your heart and the carotid vessels to your brain. Diabetes or even increased sugars put you at 300% increased risk of heart attack and stroke and this is why.. The sugar scrapes down your large blood vessels and your body sees  this as an internal injury and tries to repair itself. Just like you get a scab on your skin, your platelets will stick to the blood vessel wall trying to heal it. This is why we have diabetics on low dose aspirin daily, this prevents the platelets from sticking and can prevent plaque formation. In addition, your body takes cholesterol and tries to shove it into the open wound. This is why we want your LDL, or bad cholesterol, below 70.   The combination of platelets and cholesterol over  5-10 years forms plaque that can break off and cause a heart attack or stroke.   PLEASE REMEMBER:  Diabetes is preventable! Up to 55 percent of complications and morbidities among individuals with type 2 diabetes can be prevented, delayed, or effectively treated and minimized with regular visits to a health professional, appropriate monitoring and medication, and a healthy diet and lifestyle.  Before you even begin to attack a weight-loss plan, it pays to remember this: You are not fat. You have fat. Losing weight isn't about blame or shame; it's simply another achievement to accomplish. Dieting is like any other skill-you have to buckle down and work at it. As long as you act in a smart, reasonable way, you'll ultimately get where you want to be. Here are some weight loss pearls for you.  1. It's Not a Diet. It's a Lifestyle Thinking of a diet as something you're on and suffering through only for the short term doesn't work. To shed weight and keep it off, you need to make permanent changes to the way you eat. It's OK to indulge occasionally, of course, but if you cut calories temporarily and then revert to your old way of eating, you'll gain back the weight quicker than you can say yo-yo. Use it to lose it. Research shows that one of the best predictors of long-term weight loss is how many pounds you drop in the first month. For that reason, nutritionists often suggest being stricter for the first two weeks of your new eating strategy to build momentum. Cut out added sugar and alcohol and avoid unrefined carbs. After that, figure out how you can reincorporate them in a way that's healthy and maintainable.  2. There's a Right Way to Exercise Working out burns calories and fat and boosts your metabolism by building muscle. But those trying to lose weight are notorious for overestimating the number of calories they burn and underestimating the amount they take in. Unfortunately, your system is biologically  programmed to hold on to extra pounds and that means when you start exercising, your body senses the deficit and ramps up its hunger signals. If you're not diligent, you'll eat everything you burn and then some. Use it to lose it. Cardio gets all the exercise glory, but strength and interval training are the real heroes. They help you build lean muscle, which in turn increases your metabolism and calorie-burning ability 3. Don't Overreact to Mild Hunger Some people have a hard time losing weight because of hunger anxiety. To them, being hungry is bad-something to be avoided at all costs-so they carry snacks with them and eat when they don't need to. Others eat because they're stressed out or bored. While you never want to get to the point of being ravenous (that's when bingeing is likely to happen), a hunger pang, a craving, or the fact that it's 3:00 p.m. should not send you racing for the vending machine or obsessing about the energy bar in  your purse. Ideally, you should put off eating until your stomach is growling and it's difficult to concentrate.  Use it to lose it. When you feel the urge to eat, use the HALT method. Ask yourself, Am I really hungry? Or am I angry or anxious, lonely or bored, or tired? If you're still not certain, try the apple test. If you're truly hungry, an apple should seem delicious; if it doesn't, something else is going on. Or you can try drinking water and making yourself busy, if you are still hungry try a healthy snack.  4. Not All Calories Are Created Equal The mechanics of weight loss are pretty simple: Take in fewer calories than you use for energy. But the kind of food you eat makes all the difference. Processed food that's high in saturated fat and refined starch or sugar can cause inflammation that disrupts the hormone signals that tell your brain you're full. The result: You eat a lot more.  Use it to lose it. Clean up your diet. Swap in whole, unprocessed foods,  including vegetables, lean protein, and healthy fats that will fill you up and give you the biggest nutritional bang for your calorie buck. In a few weeks, as your brain starts receiving regular hunger and fullness signals once again, you'll notice that you feel less hungry overall and naturally start cutting back on the amount you eat.  5. Protein, Produce, and Plant-Based Fats Are Your Weight-Loss Trinity Here's why eating the three Ps regularly will help you drop pounds. Protein fills you up. You need it to build lean muscle, which keeps your metabolism humming so that you can torch more fat. People in a weight-loss program who ate double the recommended daily allowance for protein (about 110 grams for a 150-pound woman) lost 70 percent of their weight from fat, while people who ate the RDA lost only about 40 percent, one study found. Produce is packed with filling fiber. "It's very difficult to consume too many calories if you're eating a lot of vegetables. Example: Three cups of broccoli is a lot of food, yet only 93 calories. (Fruit is another story. It can be easy to overeat and can contain a lot of calories from sugar, so be sure to monitor your intake.) Plant-based fats like olive oil and those in avocados and nuts are healthy and extra satiating.  Use it to lose it. Aim to incorporate each of the three Ps into every meal and snack. People who eat protein throughout the day are able to keep weight off, according to a study in the [place] of Clinical Nutrition. In addition to meat, poultry and seafood, good sources are beans, lentils, eggs, tofu, and yogurt. As for fat, keep portion sizes in check by measuring out salad dressing, oil, and nut butters (shoot for one to two tablespoons). Finally, eat veggies or a little fruit at every meal. People who did that consumed 308 fewer calories but didn't feel any hungrier than when they didn't eat more produce.  7. How You Eat Is As Important As  What You Eat In order for your brain to register that you're full, you need to focus on what you're eating. Sit down whenever you eat, preferably at a table. Turn off the TV or computer, put down your phone, and look at your food. Smell it. Chew slowly, and don't put another bite on your fork until you swallow. When women ate lunch this attentively, they consumed 30 percent less when snacking  later than those who listened to an audiobook at lunchtime, according to a study in the [place] of Nutrition. 8. Weighing Yourself Really Works The scale provides the best evidence about whether your efforts are paying off. Seeing the numbers tick up or down or stagnate is motivation to keep going-or to rethink your approach. A 2015 study at [facility] found that daily weigh-ins helped people lose more weight, keep it off, and maintain that loss, even after two years. Use it to lose it. Step on the scale at the same time every day for the best results. If your weight shoots up several pounds from one weigh-in to the next, don't freak out. Eating a lot of salt the night before or having your period is the likely culprit. The number should return to normal in a day or two. It's a steady climb that you need to do something about. 9. Too Much Stress and Too Little Sleep Are Your Enemies When you're tired and frazzled, your body cranks up the production of cortisol, the stress hormone that can cause carb cravings. Not getting enough sleep also boosts your levels of ghrelin, a hormone associated with hunger, while suppressing leptin, a hormone that signals fullness and satiety. People on a diet who slept only five and a half hours a night for two weeks lost 55 percent less fat and were hungrier than those who slept eight and a half hours, according to a study in the [place]. Use it to lose it. Prioritize sleep, aiming for seven hours or more a night, which research shows helps lower  stress. And make sure you're getting quality zzz's. If a snoring spouse or a fidgety cat wakes you up frequently throughout the night, you may end up getting the equivalent of just four hours of sleep, according to a study from [facility]. Keep pets out of the bedroom, and use a white-noise app to drown out snoring. 10. You Will Hit a plateau-And You Can Bust Through It As you slim down, your body releases much less leptin, the fullness hormone.  If you're not strength training, start right now. Building muscle can raise your metabolism to help you overcome a plateau. To keep your body challenged and burning calories, incorporate new moves and more intense intervals into your workouts or add another sweat session to your weekly routine. Alternatively, cut an extra 100 calories or so a day from your diet. Now that you've lost weight, your body simply doesn't need as much fuel.   Record yourself at night . I think it is possible that you have sleep apnea. It can cause interrupted sleep, headaches, frequent awakenings, fatigue, dry mouth, fast/slow heart beats, memory issues, anxiety/depression, swelling, numbness tingling hands/feet, weight gain, shortness of breath, and the list goes on. Sleep apnea needs to be ruled out because if it is left untreated it does eventually lead to abnormal heart beats, lung failure or heart failure as well as increasing the risk of heart attack and stroke. There are masks you can wear OR a mouth piece that I can give you information about. Often times though people feel MUCH better after getting treatment.   Sleep Apnea  Sleep apnea is a sleep disorder characterized by abnormal pauses in breathing while you sleep. When your breathing pauses, the level of oxygen in your blood decreases. This causes you to move out of deep sleep and into light sleep. As a result, your quality of sleep is poor,  and the system that carries your blood throughout your body (cardiovascular  system) experiences stress. If sleep apnea remains untreated, the following conditions can develop:  High blood pressure (hypertension).  Coronary artery disease.  Inability to achieve or maintain an erection (impotence).  Impairment of your thought process (cognitive dysfunction). There are three types of sleep apnea: 1. Obstructive sleep apnea--Pauses in breathing during sleep because of a blocked airway. 2. Central sleep apnea--Pauses in breathing during sleep because the area of the brain that controls your breathing does not send the correct signals to the muscles that control breathing. 3. Mixed sleep apnea--A combination of both obstructive and central sleep apnea.  RISK FACTORS The following risk factors can increase your risk of developing sleep apnea:  Being overweight.  Smoking.  Having narrow passages in your nose and throat.  Being of older age.  Being female.  Alcohol use.  Sedative and tranquilizer use.  Ethnicity. Among individuals younger than 35 years, African Americans are at increased risk of sleep apnea. SYMPTOMS   Difficulty staying asleep.  Daytime sleepiness and fatigue.  Loss of energy.  Irritability.  Loud, heavy snoring.  Morning headaches.  Trouble concentrating.  Forgetfulness.  Decreased interest in sex. DIAGNOSIS  In order to diagnose sleep apnea, your caregiver will perform a physical examination. Your caregiver may suggest that you take a home sleep test. Your caregiver may also recommend that you spend the night in a sleep lab. In the sleep lab, several monitors record information about your heart, lungs, and brain while you sleep. Your leg and arm movements and blood oxygen level are also recorded. TREATMENT The following actions may help to resolve mild sleep apnea:  Sleeping on your side.   Using a decongestant if you have nasal congestion.   Avoiding the use of depressants, including alcohol, sedatives, and narcotics.    Losing weight and modifying your diet if you are overweight. There also are devices and treatments to help open your airway:  Oral appliances. These are custom-made mouthpieces that shift your lower jaw forward and slightly open your bite. This opens your airway.  Devices that create positive airway pressure. This positive pressure "splints" your airway open to help you breathe better during sleep. The following devices create positive airway pressure:  Continuous positive airway pressure (CPAP) device. The CPAP device creates a continuous level of air pressure with an air pump. The air is delivered to your airway through a mask while you sleep. This continuous pressure keeps your airway open.  Nasal expiratory positive airway pressure (EPAP) device. The EPAP device creates positive air pressure as you exhale. The device consists of single-use valves, which are inserted into each nostril and held in place by adhesive. The valves create very little resistance when you inhale but create much more resistance when you exhale. That increased resistance creates the positive airway pressure. This positive pressure while you exhale keeps your airway open, making it easier to breath when you inhale again.  Bilevel positive airway pressure (BPAP) device. The BPAP device is used mainly in patients with central sleep apnea. This device is similar to the CPAP device because it also uses an air pump to deliver continuous air pressure through a mask. However, with the BPAP machine, the pressure is set at two different levels. The pressure when you exhale is lower than the pressure when you inhale.  Surgery. Typically, surgery is only done if you cannot comply with less invasive treatments or if the less invasive treatments  do not improve your condition. Surgery involves removing excess tissue in your airway to create a wider passage way. Document Released: 10/28/2002 Document Revised: 03/04/2013 Document  Reviewed: 03/15/2012 [facility] Patient Information 2015 [place], [place]. This information is not intended to replace advice given to you by your health care provider. Make sure you discuss any questions you have with your health care provider.

## 2015-06-03 ENCOUNTER — Encounter: Payer: Self-pay | Admitting: Physician Assistant

## 2015-06-03 ENCOUNTER — Other Ambulatory Visit: Payer: Self-pay

## 2015-06-03 LAB — CBC WITH DIFFERENTIAL/PLATELET
Basophils Absolute: 0.1 10*3/uL (ref 0.0–0.1)
Basophils Relative: 1 % (ref 0–1)
Eosinophils Absolute: 0.1 10*3/uL (ref 0.0–0.7)
Eosinophils Relative: 1 % (ref 0–5)
HCT: 40.6 % (ref 36.0–46.0)
HEMOGLOBIN: 13.3 g/dL (ref 12.0–15.0)
Lymphocytes Relative: 30 % (ref 12–46)
Lymphs Abs: 3.9 10*3/uL (ref 0.7–4.0)
MCH: 26.4 pg (ref 26.0–34.0)
MCHC: 32.8 g/dL (ref 30.0–36.0)
MCV: 80.7 fL (ref 78.0–100.0)
MONO ABS: 0.9 10*3/uL (ref 0.1–1.0)
MPV: 10.1 fL (ref 8.6–12.4)
Monocytes Relative: 7 % (ref 3–12)
NEUTROS PCT: 61 % (ref 43–77)
Neutro Abs: 7.9 10*3/uL — ABNORMAL HIGH (ref 1.7–7.7)
Platelets: 346 10*3/uL (ref 150–400)
RBC: 5.03 MIL/uL (ref 3.87–5.11)
RDW: 15.7 % — ABNORMAL HIGH (ref 11.5–15.5)
WBC: 13 10*3/uL — ABNORMAL HIGH (ref 4.0–10.5)

## 2015-06-03 LAB — URINALYSIS, ROUTINE W REFLEX MICROSCOPIC
BILIRUBIN URINE: NEGATIVE
Glucose, UA: NEGATIVE mg/dL
HGB URINE DIPSTICK: NEGATIVE
Ketones, ur: NEGATIVE mg/dL
LEUKOCYTES UA: NEGATIVE
NITRITE: NEGATIVE
PH: 6.5 (ref 5.0–8.0)
Protein, ur: NEGATIVE mg/dL
SPECIFIC GRAVITY, URINE: 1.014 (ref 1.005–1.030)
Urobilinogen, UA: 1 mg/dL (ref 0.0–1.0)

## 2015-06-03 LAB — HEPATIC FUNCTION PANEL
ALBUMIN: 3.9 g/dL (ref 3.5–5.2)
ALT: 26 U/L (ref 0–35)
AST: 26 U/L (ref 0–37)
Alkaline Phosphatase: 107 U/L (ref 39–117)
Bilirubin, Direct: 0.1 mg/dL (ref 0.0–0.3)
Indirect Bilirubin: 0.5 mg/dL (ref 0.2–1.2)
TOTAL PROTEIN: 6.9 g/dL (ref 6.0–8.3)
Total Bilirubin: 0.6 mg/dL (ref 0.2–1.2)

## 2015-06-03 LAB — VITAMIN D 25 HYDROXY (VIT D DEFICIENCY, FRACTURES): Vit D, 25-Hydroxy: 56 ng/mL (ref 30–100)

## 2015-06-03 LAB — BASIC METABOLIC PANEL WITH GFR
BUN: 15 mg/dL (ref 6–23)
CO2: 24 meq/L (ref 19–32)
Calcium: 9.1 mg/dL (ref 8.4–10.5)
Chloride: 98 mEq/L (ref 96–112)
Creat: 0.96 mg/dL (ref 0.50–1.10)
GFR, Est African American: 71 mL/min
GFR, Est Non African American: 61 mL/min
GLUCOSE: 84 mg/dL (ref 70–99)
Potassium: 4.1 mEq/L (ref 3.5–5.3)
Sodium: 140 mEq/L (ref 135–145)

## 2015-06-03 LAB — LIPID PANEL
CHOL/HDL RATIO: 3.3 ratio
Cholesterol: 150 mg/dL (ref 0–200)
HDL: 46 mg/dL (ref >=46)
LDL CALC: 70 mg/dL (ref 0–99)
TRIGLYCERIDES: 171 mg/dL — AB (ref <150)
VLDL: 34 mg/dL (ref 0–40)

## 2015-06-03 LAB — MICROALBUMIN / CREATININE URINE RATIO
Creatinine, Urine: 111.8 mg/dL
MICROALB/CREAT RATIO: 3.6 mg/g (ref 0.0–30.0)
Microalb, Ur: 0.4 mg/dL (ref <2.0)

## 2015-06-03 LAB — IRON AND TIBC
%SAT: 16 % — ABNORMAL LOW (ref 20–55)
Iron: 55 ug/dL (ref 42–145)
TIBC: 334 ug/dL (ref 250–470)
UIBC: 279 ug/dL (ref 125–400)

## 2015-06-03 LAB — HEMOGLOBIN A1C
HEMOGLOBIN A1C: 6.6 % — AB (ref <5.7)
MEAN PLASMA GLUCOSE: 143 mg/dL — AB (ref <117)

## 2015-06-03 LAB — VITAMIN B12

## 2015-06-03 LAB — TSH: TSH: 0.707 u[IU]/mL (ref 0.350–4.500)

## 2015-06-03 LAB — FERRITIN: FERRITIN: 35 ng/mL (ref 10–291)

## 2015-06-03 LAB — MAGNESIUM: MAGNESIUM: 2 mg/dL (ref 1.5–2.5)

## 2015-06-03 LAB — INSULIN, FASTING: INSULIN FASTING, SERUM: 20.4 u[IU]/mL — AB (ref 2.0–19.6)

## 2015-06-03 MED ORDER — FUROSEMIDE 20 MG PO TABS: 20.0000 mg | ORAL_TABLET | Freq: Two times a day (BID) | ORAL | Status: DC

## 2015-06-24 ENCOUNTER — Other Ambulatory Visit: Payer: Self-pay | Admitting: Physician Assistant

## 2015-06-24 DIAGNOSIS — R928 Other abnormal and inconclusive findings on diagnostic imaging of breast: Secondary | ICD-10-CM

## 2015-06-25 ENCOUNTER — Other Ambulatory Visit: Payer: Self-pay | Admitting: Physician Assistant

## 2015-06-25 MED ORDER — LEVOTHYROXINE SODIUM 125 MCG PO TABS: ORAL_TABLET | ORAL | Status: DC

## 2015-06-25 MED ORDER — DILTIAZEM HCL ER 240 MG PO CP24: 240.0000 mg | ORAL_CAPSULE | Freq: Every day | ORAL | Status: DC

## 2015-07-02 ENCOUNTER — Other Ambulatory Visit: Payer: Self-pay | Admitting: Physician Assistant

## 2015-07-02 DIAGNOSIS — N63 Unspecified lump in unspecified breast: Secondary | ICD-10-CM

## 2015-07-02 DIAGNOSIS — Z1231 Encounter for screening mammogram for malignant neoplasm of breast: Secondary | ICD-10-CM

## 2015-07-06 ENCOUNTER — Other Ambulatory Visit: Payer: Self-pay

## 2015-07-06 MED ORDER — MELOXICAM 7.5 MG PO TABS: ORAL_TABLET | ORAL | Status: DC

## 2015-07-06 MED ORDER — CYCLOBENZAPRINE HCL 10 MG PO TABS: ORAL_TABLET | ORAL | Status: DC

## 2015-07-28 ENCOUNTER — Other Ambulatory Visit: Payer: Managed Care, Other (non HMO)

## 2015-08-03 ENCOUNTER — Other Ambulatory Visit: Payer: Managed Care, Other (non HMO)

## 2015-08-06 ENCOUNTER — Other Ambulatory Visit: Payer: Managed Care, Other (non HMO)

## 2015-08-13 ENCOUNTER — Ambulatory Visit
Admission: RE | Admit: 2015-08-13 | Discharge: 2015-08-13 | Disposition: A | Discharge status: Home / Self Care | Payer: BLUE CROSS/BLUE SHIELD | Source: Ambulatory Visit | Attending: Physician Assistant | Admitting: Physician Assistant

## 2015-08-13 DIAGNOSIS — N63 Unspecified lump in unspecified breast: Secondary | ICD-10-CM

## 2015-08-31 ENCOUNTER — Other Ambulatory Visit: Payer: Self-pay | Admitting: Physician Assistant

## 2015-08-31 MED ORDER — ESOMEPRAZOLE MAGNESIUM 40 MG PO CPDR: DELAYED_RELEASE_CAPSULE | ORAL | Status: DC

## 2015-09-02 ENCOUNTER — Encounter: Payer: Self-pay | Admitting: Internal Medicine

## 2015-09-10 ENCOUNTER — Encounter: Payer: Self-pay | Admitting: Internal Medicine

## 2015-09-10 ENCOUNTER — Ambulatory Visit (INDEPENDENT_AMBULATORY_CARE_PROVIDER_SITE_OTHER): Payer: BLUE CROSS/BLUE SHIELD | Admitting: Internal Medicine

## 2015-09-10 VITALS — BP 140/80 | HR 90 | Temp 97.7°F | Resp 14 | Ht 67.0 in | Wt 237.0 lb

## 2015-09-10 DIAGNOSIS — E2839 Other primary ovarian failure: Secondary | ICD-10-CM | POA: Diagnosis not present

## 2015-09-10 DIAGNOSIS — Z23 Encounter for immunization: Secondary | ICD-10-CM | POA: Diagnosis not present

## 2015-09-10 DIAGNOSIS — N182 Chronic kidney disease, stage 2 (mild): Secondary | ICD-10-CM

## 2015-09-10 DIAGNOSIS — L918 Other hypertrophic disorders of the skin: Secondary | ICD-10-CM | POA: Diagnosis not present

## 2015-09-10 DIAGNOSIS — E039 Hypothyroidism, unspecified: Secondary | ICD-10-CM

## 2015-09-10 DIAGNOSIS — E118 Type 2 diabetes mellitus with unspecified complications: Secondary | ICD-10-CM

## 2015-09-10 DIAGNOSIS — E785 Hyperlipidemia, unspecified: Secondary | ICD-10-CM | POA: Diagnosis not present

## 2015-09-10 DIAGNOSIS — Z79899 Other long term (current) drug therapy: Secondary | ICD-10-CM

## 2015-09-10 LAB — CBC WITH DIFFERENTIAL/PLATELET
BASOS ABS: 0 10*3/uL (ref 0.0–0.1)
BASOS PCT: 0 % (ref 0–1)
EOS ABS: 0.3 10*3/uL (ref 0.0–0.7)
EOS PCT: 2 % (ref 0–5)
HCT: 38.8 % (ref 36.0–46.0)
Hemoglobin: 12.8 g/dL (ref 12.0–15.0)
Lymphocytes Relative: 29 % (ref 12–46)
Lymphs Abs: 3.9 10*3/uL (ref 0.7–4.0)
MCH: 26.4 pg (ref 26.0–34.0)
MCHC: 33 g/dL (ref 30.0–36.0)
MCV: 80.2 fL (ref 78.0–100.0)
MONOS PCT: 7 % (ref 3–12)
MPV: 9.9 fL (ref 8.6–12.4)
Monocytes Absolute: 0.9 10*3/uL (ref 0.1–1.0)
NEUTROS PCT: 62 % (ref 43–77)
Neutro Abs: 8.4 10*3/uL — ABNORMAL HIGH (ref 1.7–7.7)
PLATELETS: 334 10*3/uL (ref 150–400)
RBC: 4.84 MIL/uL (ref 3.87–5.11)
RDW: 15.8 % — AB (ref 11.5–15.5)
WBC: 13.5 10*3/uL — ABNORMAL HIGH (ref 4.0–10.5)

## 2015-09-10 NOTE — Progress Notes (Addendum)
Patient ID: [name], female   DOB: Nov 25, 1945, 69 y.o.   MRN: [id number]  Assessment and Plan:  Hypertension:  -Continue medication,  -monitor blood pressure at home.  -Continue DASH diet.   -Reminder to go to the ER if any CP, SOB, nausea, dizziness, severe HA, changes vision/speech, left arm numbness and tingling, and jaw pain.  Cholesterol: -Continue diet and exercise.  -Check cholesterol.   Pre-diabetes: -Continue diet and exercise.  -Check A1C  Vitamin D Def: -check level -continue medications.   Skin tag removal -tolerated well -skin prepped and draped with semisterile technique -skin adequately numbed with lidocaine -skin tag removed and cauterized -tolerated procedure well  Continue diet and meds as discussed. Further disposition pending results of labs.  HPI 69 y.o. female  presents for 3 month follow up with hypertension, hyperlipidemia, prediabetes and vitamin D.   Her blood pressure has been controlled at home, today their BP is BP: 140/80 mmHg.   She does not workout. She denies chest pain, shortness of breath, dizziness.   She is on cholesterol medication and denies myalgias. Her cholesterol is at goal. The cholesterol last visit was:   Lab Results  Component Value Date   CHOL 150 06/02/2015   HDL 46 06/02/2015   LDLCALC 70 06/02/2015   TRIG 171* 06/02/2015   CHOLHDL 3.3 06/02/2015     She has been working on diet and exercise for prediabetes, and denies foot ulcerations, hyperglycemia, hypoglycemia , increased appetite, nausea, paresthesia of the feet, polydipsia, polyuria, visual disturbances, vomiting and weight loss. Last A1C in the office was:  Lab Results  Component Value Date   HGBA1C 6.6* 06/02/2015    Patient is on Vitamin D supplement.  Lab Results  Component Value Date   VD25OH 56 06/02/2015     Patient reports that she has had a lot of stiffness lately.  She reports that she does great as long as she doesn't sit down too long.      Current Medications:  Current Outpatient Prescriptions on File Prior to Visit  Medication Sig Dispense Refill  . amitriptyline (ELAVIL) 25 MG tablet TAKE 1 TABLET TWICE A DAY 180 tablet 3  . aspirin 81 MG tablet Take 81 mg by mouth daily.    Marland Kitchen atorvastatin (LIPITOR) 40 MG tablet TAKE 1 TABLET AT BEDTIME   FOR CHOLESTEROL 90 tablet 1  . b complex vitamins tablet Take 1 tablet by mouth daily.    Marland Kitchen CALCIUM PO Take 1,200 mg by mouth daily.     . cyclobenzaprine (FLEXERIL) 10 MG tablet Take 1/2 to 1 tablet three times daily as needed for muscle spasm 30 tablet 2  . diltiazem (DILACOR XR) 240 MG 24 hr capsule Take 1 capsule (240 mg total) by mouth daily. 90 capsule 3  . esomeprazole (NEXIUM) 40 MG capsule TAKE 1 CAPSULE DAILY 90 capsule 1  . estradiol (ESTRACE) 0.5 MG tablet TAKE 1 TABLET DAILY 90 tablet 3  . furosemide (LASIX) 20 MG tablet Take 1 tablet (20 mg total) by mouth 2 (two) times daily. 180 tablet 3  . levothyroxine (SYNTHROID) 125 MCG tablet TAKE 1 TABLET DAILY FOR THYROID 90 tablet 3  . meloxicam (MOBIC) 7.5 MG tablet TAKE 1 TABLET DAILY FOR    PAIN WITH FOOD 90 tablet 2  . Multiple Vitamin (MULTIVITAMIN) tablet Take 1 tablet by mouth daily.    . nortriptyline (PAMELOR) 25 MG capsule Take 25 mg by mouth 2 (two) times daily.     Marland Kitchen  pregabalin (LYRICA) 75 MG capsule Take 1 capsule (75 mg total) by mouth 3 (three) times daily. 270 capsule 3   No current facility-administered medications on file prior to visit.    Medical History:  Past Medical History  Diagnosis Date  . Varicose veins   . Obesity   . Fatigue   . Chest tightness   . Myalgia   . GERD (gastroesophageal reflux disease)   . Ulcer   . Hyperlipidemia   . Hypothyroid   . Anemia   . Prediabetes     Allergies:  Allergies  Allergen Reactions  . Levaquin [Levofloxacin In D5w]   . Macrobid [Nitrofurantoin Macrocrystal]   . Penicillins     REACTION: rash  . Procaine Hcl     REACTION: difficulty breathing  .  Sulfonamide Derivatives     REACTION: hives  . Tetanus Toxoid     REACTION: arm swells     Review of Systems:  Review of Systems  Constitutional: Negative for fever and chills.  HENT: Negative for congestion, ear pain and sore throat.   Cardiovascular: Negative for chest pain, palpitations and leg swelling.  Neurological: Negative for headaches.    Family history- Review and unchanged  Social history- Review and unchanged  Physical Exam: BP 140/80 mmHg  Pulse 90  Temp(Src) 97.7 F (36.5 C) (Temporal)  Resp 14  Ht 5\' 7"  (1.702 m)  Wt 237 lb (107.502 kg)  BMI 37.11 kg/m2  SpO2 93% Wt Readings from Last 3 Encounters:  09/10/15 237 lb (107.502 kg)  06/02/15 237 lb (107.502 kg)  12/25/14 233 lb (105.688 kg)    General Appearance: Well nourished well developed, in no apparent distress. Eyes: PERRLA, EOMs, conjunctiva no swelling or erythema ENT/Mouth: Ear canals normal without obstruction, swelling, erythma, discharge.  TMs normal bilaterally.  Oropharynx moist, clear, without exudate, or postoropharyngeal swelling. Neck: Supple, thyroid normal,no cervical adenopathy  Respiratory: Respiratory effort normal, Breath sounds clear A&P without rhonchi, wheeze, or rale.  No retractions, no accessory usage. Cardio: RRR with no MRGs. Brisk peripheral pulses without edema.  Abdomen: Soft, + BS,  Non tender, no guarding, rebound, hernias, masses. Musculoskeletal: Full ROM, 5/5 strength, Normal gait Skin: Warm, dry without rashes, lesions, ecchymosis.  Neuro: Awake and oriented X 3, Cranial nerves intact. Normal muscle tone, no cerebellar symptoms. Psych: Normal affect, Insight and Judgment appropriate.    Starlyn Skeans, PA-C 4:17 PM [facility]

## 2015-09-11 LAB — BASIC METABOLIC PANEL WITH GFR
BUN: 17 mg/dL (ref 7–25)
CHLORIDE: 102 mmol/L (ref 98–110)
CO2: 25 mmol/L (ref 20–31)
CREATININE: 0.91 mg/dL (ref 0.50–0.99)
Calcium: 8.8 mg/dL (ref 8.6–10.4)
GFR, EST AFRICAN AMERICAN: 76 mL/min (ref >=60)
GFR, Est Non African American: 65 mL/min (ref >=60)
Glucose, Bld: 78 mg/dL (ref 65–99)
Potassium: 4.1 mmol/L (ref 3.5–5.3)
Sodium: 138 mmol/L (ref 135–146)

## 2015-09-11 LAB — HEMOGLOBIN A1C
HEMOGLOBIN A1C: 6.3 % — AB (ref <5.7)
MEAN PLASMA GLUCOSE: 134 mg/dL — AB (ref <117)

## 2015-09-11 LAB — TSH: TSH: 0.761 u[IU]/mL (ref 0.350–4.500)

## 2015-09-11 LAB — HEPATIC FUNCTION PANEL
ALBUMIN: 3.7 g/dL (ref 3.6–5.1)
ALT: 18 U/L (ref 6–29)
AST: 16 U/L (ref 10–35)
Alkaline Phosphatase: 107 U/L (ref 33–130)
BILIRUBIN DIRECT: 0.1 mg/dL (ref <=0.2)
BILIRUBIN TOTAL: 0.5 mg/dL (ref 0.2–1.2)
Indirect Bilirubin: 0.4 mg/dL (ref 0.2–1.2)
Total Protein: 6.3 g/dL (ref 6.1–8.1)

## 2015-10-08 ENCOUNTER — Other Ambulatory Visit: Payer: Self-pay | Admitting: Internal Medicine

## 2015-10-27 ENCOUNTER — Other Ambulatory Visit: Payer: Self-pay | Admitting: Internal Medicine

## 2015-11-04 ENCOUNTER — Encounter: Payer: Self-pay | Admitting: Physician Assistant

## 2015-11-04 ENCOUNTER — Ambulatory Visit (INDEPENDENT_AMBULATORY_CARE_PROVIDER_SITE_OTHER): Payer: BLUE CROSS/BLUE SHIELD | Admitting: Physician Assistant

## 2015-11-04 VITALS — BP 130/82 | HR 90 | Temp 97.7°F | Resp 14 | Ht 67.0 in | Wt 228.0 lb

## 2015-11-04 DIAGNOSIS — M791 Myalgia, unspecified site: Secondary | ICD-10-CM

## 2015-11-04 DIAGNOSIS — R29898 Other symptoms and signs involving the musculoskeletal system: Secondary | ICD-10-CM

## 2015-11-04 DIAGNOSIS — M25561 Pain in right knee: Secondary | ICD-10-CM | POA: Diagnosis not present

## 2015-11-04 DIAGNOSIS — M25559 Pain in unspecified hip: Secondary | ICD-10-CM | POA: Diagnosis not present

## 2015-11-04 DIAGNOSIS — M25562 Pain in left knee: Secondary | ICD-10-CM

## 2015-11-04 MED ORDER — CYCLOBENZAPRINE HCL 10 MG PO TABS: ORAL_TABLET | ORAL | Status: DC

## 2015-11-04 MED ORDER — DICLOFENAC SODIUM 75 MG PO TBEC: DELAYED_RELEASE_TABLET | ORAL | Status: DC

## 2015-11-04 NOTE — Progress Notes (Signed)
Subjective:    Patient ID: [name], female    DOB: October 20, 1946, 69 y.o.   MRN: [id number]  HPI 69 y.o. WM presents with feet and leg pain.  She states she has constant leg pain, has been worse last week. States if chair is too low she can not get up from seated position. She states that her legs feel week occ, tight/hard, and feel achy. She states it will feel like her knees will give out on her, right hip will catch on her. She is on statin, she has had achilles tendon surgery on right leg 2-3 years ago. She is on lyrica, mobic, and amtriptyline.  Denies fever, chills, easy bruising/bleeding, numbness tingling.   Blood pressure 130/82, pulse 90, temperature 97.7 F (36.5 C), temperature source Temporal, resp. rate 14, height '5\' 7"'  (1.702 m), weight 228 lb (103.42 kg), SpO2 96 %.  Past Medical History  Diagnosis Date  . Varicose veins   . Obesity   . Fatigue   . Chest tightness   . Myalgia   . GERD (gastroesophageal reflux disease)   . Ulcer   . Hyperlipidemia   . Hypothyroid   . Anemia   . Prediabetes    Current Outpatient Prescriptions on File Prior to Visit  Medication Sig Dispense Refill  . amitriptyline (ELAVIL) 25 MG tablet TAKE 1 TABLET TWICE A DAY 180 tablet 3  . aspirin 81 MG tablet Take 81 mg by mouth daily.    Marland Kitchen atorvastatin (LIPITOR) 40 MG tablet TAKE 1 TABLET AT BEDTIME   FOR CHOLESTEROL 90 tablet 1  . b complex vitamins tablet Take 1 tablet by mouth daily.    Marland Kitchen CALCIUM PO Take 1,200 mg by mouth daily.     . cyclobenzaprine (FLEXERIL) 10 MG tablet Take 1/2 to 1 tablet three times daily as needed for muscle spasm 30 tablet 2  . diltiazem (DILACOR XR) 240 MG 24 hr capsule Take 1 capsule (240 mg total) by mouth daily. 90 capsule 3  . esomeprazole (NEXIUM) 40 MG capsule TAKE 1 CAPSULE DAILY 90 capsule 1  . estradiol (ESTRACE) 0.5 MG tablet TAKE 1 TABLET DAILY 90 tablet 3  . furosemide (LASIX) 20 MG tablet Take 1 tablet (20 mg total) by mouth 2 (two) times daily.  180 tablet 3  . levothyroxine (SYNTHROID) 125 MCG tablet TAKE 1 TABLET DAILY FOR THYROID 90 tablet 3  . meloxicam (MOBIC) 7.5 MG tablet TAKE 1 TABLET DAILY FOR    PAIN WITH FOOD 90 tablet 2  . Multiple Vitamin (MULTIVITAMIN) tablet Take 1 tablet by mouth daily.    . nortriptyline (PAMELOR) 25 MG capsule Take 25 mg by mouth 2 (two) times daily.     . pregabalin (LYRICA) 75 MG capsule Take 1 capsule (75 mg total) by mouth 3 (three) times daily. 270 capsule 3   No current facility-administered medications on file prior to visit.   Review of Systems  Constitutional: Negative.   HENT: Negative.   Eyes: Negative.   Respiratory: Negative.   Cardiovascular: Positive for leg swelling. Negative for chest pain and palpitations.  Gastrointestinal: Negative for nausea, vomiting, abdominal pain, diarrhea, constipation and blood in stool.  Genitourinary: Negative.   Musculoskeletal: Positive for myalgias, arthralgias and gait problem. Negative for back pain, joint swelling, neck pain and neck stiffness.  Skin: Negative.   Neurological: Negative for dizziness, tremors and seizures.  Psychiatric/Behavioral: Negative.        Objective:   Physical Exam  Constitutional: She  is oriented to person, place, and time. She appears well-developed and well-nourished. No distress.  HENT:  Head: Normocephalic.  Right Ear: External ear normal.  Left Ear: External ear normal.  Nose: Nose normal.  Mouth/Throat: Oropharynx is clear and moist.  Eyes: Conjunctivae are normal. Pupils are equal, round, and reactive to light.  Neck: Normal range of motion. Neck supple. No thyromegaly present.  Cardiovascular: Normal rate, regular rhythm and normal heart sounds.   No murmur heard. Pulmonary/Chest: Effort normal and breath sounds normal. She has no wheezes.  Abdominal: Soft. Bowel sounds are normal. There is no tenderness.  Musculoskeletal:  Nontender SI bilateral, negative straight leg raise, bilateral lower  reflexes normal, normal sensation distal legs, good strength bilateral, full ROM bilateral hips, bilaterally knees without ligament laxity, + tenderness right knee medial joint line. Antalgic gait.   Lymphadenopathy:    She has no cervical adenopathy.  Neurological: She is alert and oriented to person, place, and time. She has normal reflexes. No cranial nerve deficit. Coordination normal.  Skin: Skin is warm and dry. No rash noted.  + varicose veins and mild edema bilateral legs.   Psychiatric: She has a normal mood and affect.          Assessment & Plan:  Muscle weakness/Pain ESR rule out PMR, CPK rule out muscle break down due to statin, versus likely OA/spinal stenosis.  + varicose veins- get on compression stockings.  Suggest PT, has been to [facility] in the past and prefers that.  Declines ortho referral at this time

## 2015-11-04 NOTE — Patient Instructions (Signed)
Arthritis Arthritis is a term that is commonly used to refer to joint pain or joint disease. There are more than 100 types of arthritis. CAUSES The most common cause of this condition is wear and tear of a joint. Other causes include:  Gout.  Inflammation of a joint.  An infection of a joint.  Sprains and other injuries near the joint.  A drug reaction or allergic reaction. In some cases, the cause may not be known. SYMPTOMS The main symptom of this condition is pain in the joint with movement. Other symptoms include:  Redness, swelling, or stiffness at a joint.  Warmth coming from the joint.  Fever.  Overall feeling of illness. DIAGNOSIS This condition may be diagnosed with a physical exam and tests, including:  Blood tests.  Urine tests.  Imaging tests, such as MRI, X-rays, or a CT scan. Sometimes, fluid is removed from a joint for testing. TREATMENT Treatment for this condition may involve:  Treatment of the cause, if it is known.  Rest.  Raising (elevating) the joint.  Applying cold or hot packs to the joint.  Medicines to improve symptoms and reduce inflammation.  Injections of a steroid such as cortisone into the joint to help reduce pain and inflammation. Depending on the cause of your arthritis, you may need to make lifestyle changes to reduce stress on your joint. These changes may include exercising more and losing weight. HOME CARE INSTRUCTIONS Medicines  Take over-the-counter and prescription medicines only as told by your health care provider.  Do not take aspirin to relieve pain if gout is suspected. Activities  Rest your joint if told by your health care provider. Rest is important when your disease is active and your joint feels painful, swollen, or stiff.  Avoid activities that make the pain worse. It is important to balance activity with rest.  Exercise your joint regularly with range-of-motion exercises as told by your health care  provider. Try doing low-impact exercise, such as:  Swimming.  Water aerobics.  Biking.  Walking. Joint Care  If your joint is swollen, keep it elevated if told by your health care provider.  If your joint feels stiff in the morning, try taking a warm shower.  If directed, apply heat to the joint. If you have diabetes, do not apply heat without permission from your health care provider.  Put a towel between the joint and the hot pack or heating pad.  Leave the heat on the area for 20-30 minutes.  If directed, apply ice to the joint:  Put ice in a plastic bag.  Place a towel between your skin and the bag.  Leave the ice on for 20 minutes, 2-3 times per day.  Keep all follow-up visits as told by your health care provider. This is important. SEEK MEDICAL CARE IF:  The pain gets worse.  You have a fever. SEEK IMMEDIATE MEDICAL CARE IF:  You develop severe joint pain, swelling, or redness.  Many joints become painful and swollen.  You develop severe back pain.  You develop severe weakness in your leg.  You cannot control your bladder or bowels.   This information is not intended to replace advice given to you by your health care provider. Make sure you discuss any questions you have with your health care provider.   Document Released: 12/15/2004 Document Revised: 07/29/2015 Document Reviewed: 02/02/2015 Elsevier Interactive Patient Education 2016 [employer].   Spinal Stenosis Spinal stenosis is an abnormal narrowing of the canals of your  spine (vertebrae). CAUSES  Spinal stenosis is caused by areas of bone pushing into the central canals of your vertebrae. This condition can be present at birth (congenital). It also may be caused by arthritic deterioration of your vertebrae (spinal degeneration).  SYMPTOMS   Pain that is generally worse with activities, particularly standing and walking.  Numbness, tingling, hot or cold sensations, weakness, or weariness in  your legs.  Frequent episodes of falling.  A foot-slapping gait that leads to muscle weakness. DIAGNOSIS  Spinal stenosis is diagnosed with the use of magnetic resonance imaging (MRI) or computed tomography (CT). TREATMENT  Initial therapy for spinal stenosis focuses on the management of the pain and other symptoms associated with the condition. These therapies include:  Practicing postural changes to lessen pressure on your nerves.  Exercises to strengthen the core of your body.  Loss of excess body weight.  The use of nonsteroidal anti-inflammatory medicines to reduce swelling and inflammation in your nerves. When therapies to manage pain are not successful, surgery to treat spinal stenosis may be recommended. This surgery involves removing excess bone, which puts pressure on your nerve roots. During this surgery (laminectomy), the posterior boney arch (lamina) and excess bone around the facet joints are removed.   This information is not intended to replace advice given to you by your health care provider. Make sure you discuss any questions you have with your health care provider.   Document Released: 01/28/2004 Document Revised: 11/28/2014 Document Reviewed: 02/15/2013 Elsevier Interactive Patient Education [employer].

## 2015-11-05 LAB — CK: CK TOTAL: 80 U/L (ref 7–177)

## 2015-11-05 LAB — SEDIMENTATION RATE: SED RATE: 32 mm/h — AB (ref 0–30)

## 2015-11-24 ENCOUNTER — Encounter: Payer: Self-pay | Admitting: Physician Assistant

## 2015-11-24 ENCOUNTER — Ambulatory Visit (INDEPENDENT_AMBULATORY_CARE_PROVIDER_SITE_OTHER): Payer: BLUE CROSS/BLUE SHIELD | Admitting: Physician Assistant

## 2015-11-24 VITALS — BP 142/70 | HR 76 | Temp 98.0°F | Resp 18 | Ht 67.0 in | Wt 223.0 lb

## 2015-11-24 DIAGNOSIS — J069 Acute upper respiratory infection, unspecified: Secondary | ICD-10-CM

## 2015-11-24 MED ORDER — PREDNISONE 20 MG PO TABS: ORAL_TABLET | ORAL | Status: DC

## 2015-11-24 MED ORDER — BENZONATATE 100 MG PO CAPS: 200.0000 mg | ORAL_CAPSULE | Freq: Three times a day (TID) | ORAL | Status: DC | PRN

## 2015-11-24 MED ORDER — FLUCONAZOLE 150 MG PO TABS: 150.0000 mg | ORAL_TABLET | Freq: Once | ORAL | Status: DC

## 2015-11-24 MED ORDER — AZITHROMYCIN 250 MG PO TABS: ORAL_TABLET | ORAL | Status: AC

## 2015-11-24 NOTE — Patient Instructions (Signed)
Sinusitis can be uncomfortable. People with sinusitis have congestion with yellow/green/gray discharge, sinus pain/pressure, pain around the eyes. Sinus infections almost ALWAYS stem from a viral infection and antibiotics don't work against a virus. Even when bacteria is responsible, the infections usually clear up on their own in a week or so.   PLEASE TRY TO DO OVER THE COUNTER TREATMENT AND PREDNISONE FOR 5-7 DAYS AND IF YOU ARE NOT GETTING BETTER OR GETTING WORSE THEN YOU CAN START ON AN ANTIBIOTIC GIVEN.  Can take the prednisone AT NIGHT WITH DINNER, it take 8-12 hours to start working so it will NOT affect your sleeping if you take it at night with your food!! Take two pills the first night and 1 or two pill the second night and then 1 pill the other nights.   Risk of antibiotic use: About 1 in 4 people who take antibiotics have side effects including stomach problems, dizziness, or rashes. Those problems clear up soon after stopping the drugs, but in rare cases antibiotics can cause severe allergic reaction. Over use of antibiotics also encourages the growth of bacteria that can't be controlled easily with drugs. That makes you more vunerable to antibiotic-resistant infections and undermines the benefits of antibiotics for others.   Waste of Money: Antibiotics often aren't very expensive, but any money spent on unnecessary drugs is money down the drain.   When are antibiotics needed? Only when symptoms last longer than a week.  Start to improve but then worsen again  -It can take up to 2 weeks to feel better.   -If you do not get better in 7-10 days (Have fever, facial pain, dental pain and swelling), then please call the office and it is now appropriate to start an antibiotic.   -Please take Tylenol or Ibuprofen for pain. -Acetaminiphen 325mg orally every 4-6 hours for pain.  Max: 10 per day -Ibuprofen 200mg orally every 6-8 hours for pain.  Take with food to avoid ulcers.   Max 10 per  day  Please pick one of the over the counter allergy medications below and take it once daily for allergies.  Claritin or loratadine cheapest but likely the weakest  Zyrtec or certizine at night because it can make you sleepy The strongest is allegra or fexafinadine  Cheapest at [facility], [employer], [employer]  -While drinking fluids, pinch and hold nose close and swallow.  This will help open up your eustachian tubes to drain the fluid behind your ear drums. -Try steam showers to open your nasal passages.   Drink lots of water to stay hydrated and to thin mucous.  Flonase/Nasonex is to help the inflammation.  Take 2 sprays in each nostril at bedtime.  Make sure you spray towards the outside of each nostril towards the outer corner of your eye, hold nose close and tilt head back.  This will help the medication get into your sinuses.  If you do not like this medication, then use saline nasal sprays same directions as above for Flonase. Stop the medication right away if you get blurring of your vision or nose bleeds.  Sinusitis Sinusitis is redness, soreness, and inflammation of the paranasal sinuses. Paranasal sinuses are air pockets within the bones of your face (beneath the eyes, the middle of the forehead, or above the eyes). In healthy paranasal sinuses, mucus is able to drain out, and air is able to circulate through them by way of your nose. However, when your paranasal sinuses are inflamed, mucus and air can   become trapped. This can allow bacteria and other germs to grow and cause infection. Sinusitis can develop quickly and last only a short time (acute) or continue over a long period (chronic). Sinusitis that lasts for more than 12 weeks is considered chronic.  CAUSES  Causes of sinusitis include: Allergies. Structural abnormalities, such as displacement of the cartilage that separates your nostrils (deviated septum), which can decrease the air flow through your nose and sinuses and affect sinus  drainage. Functional abnormalities, such as when the small hairs (cilia) that line your sinuses and help remove mucus do not work properly or are not present. SIGNS AND SYMPTOMS  Symptoms of acute and chronic sinusitis are the same. The primary symptoms are pain and pressure around the affected sinuses. Other symptoms include: Upper toothache. Earache. Headache. Bad breath. Decreased sense of smell and taste. A cough, which worsens when you are lying flat. Fatigue. Fever. Thick drainage from your nose, which often is green and may contain pus (purulent). Swelling and warmth over the affected sinuses. DIAGNOSIS  Your health care provider will perform a physical exam. During the exam, your health care provider may: Look in your nose for signs of abnormal growths in your nostrils (nasal polyps).  Tap over the affected sinus to check for signs of infection. View the inside of your sinuses (endoscopy) using an imaging device that has a light attached (endoscope). If your health care provider suspects that you have chronic sinusitis, one or more of the following tests may be recommended: Allergy tests. Nasal culture. A sample of mucus is taken from your nose, sent to a lab, and screened for bacteria. Nasal cytology. A sample of mucus is taken from your nose and examined by your health care provider to determine if your sinusitis is related to an allergy. TREATMENT  Most cases of acute sinusitis are related to a viral infection and will resolve on their own within 10 days. Sometimes medicines are prescribed to help relieve symptoms (pain medicine, decongestants, nasal steroid sprays, or saline sprays).  However, for sinusitis related to a bacterial infection, your health care provider will prescribe antibiotic medicines. These are medicines that will help kill the bacteria causing the infection.  Rarely, sinusitis is caused by a fungal infection. In theses cases, your health care provider will  prescribe antifungal medicine. For some cases of chronic sinusitis, surgery is needed. Generally, these are cases in which sinusitis recurs more than 3 times per year, despite other treatments. HOME CARE INSTRUCTIONS  Drink plenty of water. Water helps thin the mucus so your sinuses can drain more easily. Use a humidifier. Inhale steam 3 to 4 times a day (for example, sit in the bathroom with the shower running). Apply a warm, moist washcloth to your face 3 to 4 times a day, or as directed by your health care provider. Use saline nasal sprays to help moisten and clean your sinuses. Take medicines only as directed by your health care provider. If you were prescribed either an antibiotic or antifungal medicine, finish it all even if you start to feel better. SEEK IMMEDIATE MEDICAL CARE IF: You have increasing pain or severe headaches. You have nausea, vomiting, or drowsiness. You have swelling around your face. You have vision problems. You have a stiff neck. You have difficulty breathing. MAKE SURE YOU:  Understand these instructions. Will watch your condition. Will get help right away if you are not doing well or get worse. Document Released: 11/07/2005 Document Revised: 03/24/2014 Document Reviewed: 11/22/2011 [employer]   Patient Information 2015 [employer]. This information is not intended to replace advice given to you by your health care provider. Make sure you discuss any questions you have with your health care provider.   HOW TO TREAT VIRAL COUGH AND COLD SYMPTOMS:  -Symptoms usually last at least 1 week with the worst symptoms being around day 4.  - colds usually start with a sore throat and end with a cough, and the cough can take 2 weeks to get better.  -No antibiotics are needed for colds, flu, sore throats, cough, bronchitis UNLESS symptoms are longer than 7 days OR if you are getting better then get drastically worse.  -There are a lot of combination medications (Dayquil,  Nyquil, Vicks 44, tyelnol cold and sinus, ETC). Please look at the ingredients on the back so that you are treating the correct symptoms and not doubling up on medications/ingredients.    Medicines you can use  Nasal congestion  - pseudoephedrine (Sudafed)- behind the counter, do not use if you have high blood pressure, medicine that have -D in them.  - phenylephrine (Sudafed PE) -Dextormethorphan + chlorpheniramine (Coridcidin HBP)- okay if you have high blood pressure -Oxymetazoline (Afrin) nasal spray- LIMIT to 3 days -Saline nasal spray -Neti pot (used distilled or bottled water)  Ear pain/congestion  -pseudoephedrine (sudafed) - Nasonex/flonase nasal spray  Fever  -Acetaminophen (Tyelnol) -Ibuprofen (Advil, motrin, aleve)  Sore Throat  -Acetaminophen (Tyelnol) -Ibuprofen (Advil, motrin, aleve) -Drink a lot of water -Gargle with salt water - Rest your voice (don't talk) -Throat sprays -Cough drops  Body Aches  -Acetaminophen (Tyelnol) -Ibuprofen (Advil, motrin, aleve)  Headache  -Acetaminophen (Tyelnol) -Ibuprofen (Advil, motrin, aleve) - Exedrin, Exedrin Migraine  Allergy symptoms (cough, sneeze, runny nose, itchy eyes) -Claritin or loratadine cheapest but likely the weakest  -Zyrtec or certizine at night because it can make you sleepy -The strongest is allegra or fexafinadine  Cheapest at [facility], [employer], [employer]  Cough  -Dextromethorphan (Delsym)- medicine that has DM in it -Guafenesin (Mucinex/Robitussin) - cough drops - drink lots of water  Chest Congestion  -Guafenesin (Mucinex/Robitussin)  Red Itchy Eyes  - Naphcon-A  Upset Stomach  - Bland diet (nothing spicy, greasy, fried, and high acid foods like tomatoes, oranges, berries) -OKAY- cereal, bread, soup, crackers, rice -Eat smaller more frequent meals -reduce caffeine, no alcohol -Loperamide (Imodium-AD) if diarrhea -Prevacid for heart burn  General health when sick  -Hydration -wash your  hands frequently -keep surfaces clean -change pillow cases and sheets often -Get fresh air but do not exercise strenuously -Vitamin D, double up on it - Vitamin C -Zinc       

## 2015-11-24 NOTE — Progress Notes (Signed)
Subjective:    Patient ID: [name], female    DOB: Aug 03, 1946, 70 y.o.   MRN: [id number]  HPI 70 y.o. WF presents with cold symptoms x 1 week, has been taking OTC mucinex and tylenol cold without help. She has sore throat, cough, sinus congestion with green mucus, denies CP, SOB.   Blood pressure 142/70, pulse 76, temperature 98 F (36.7 C), temperature source Temporal, resp. rate 18, height 5\' 7"  (1.702 m), weight 223 lb (101.152 kg).  Current Outpatient Prescriptions on File Prior to Visit  Medication Sig Dispense Refill  . amitriptyline (ELAVIL) 25 MG tablet TAKE 1 TABLET TWICE A DAY 180 tablet 3  . aspirin 81 MG tablet Take 81 mg by mouth daily.    Marland Kitchen atorvastatin (LIPITOR) 40 MG tablet TAKE 1 TABLET AT BEDTIME   FOR CHOLESTEROL 90 tablet 1  . b complex vitamins tablet Take 1 tablet by mouth daily.    Marland Kitchen CALCIUM PO Take 1,200 mg by mouth daily.     . cyclobenzaprine (FLEXERIL) 10 MG tablet Take 1/2 to 1 tablet three times daily as needed for muscle spasm 90 tablet 2  . diclofenac (VOLTAREN) 75 MG EC tablet Take twice daily with food for 2 weeks, then take as needed 60 tablet 2  . diltiazem (DILACOR XR) 240 MG 24 hr capsule Take 1 capsule (240 mg total) by mouth daily. 90 capsule 3  . esomeprazole (NEXIUM) 40 MG capsule TAKE 1 CAPSULE DAILY 90 capsule 1  . estradiol (ESTRACE) 0.5 MG tablet TAKE 1 TABLET DAILY 90 tablet 3  . furosemide (LASIX) 20 MG tablet Take 1 tablet (20 mg total) by mouth 2 (two) times daily. 180 tablet 3  . levothyroxine (SYNTHROID) 125 MCG tablet TAKE 1 TABLET DAILY FOR THYROID 90 tablet 3  . meloxicam (MOBIC) 7.5 MG tablet TAKE 1 TABLET DAILY FOR    PAIN WITH FOOD 90 tablet 2  . Multiple Vitamin (MULTIVITAMIN) tablet Take 1 tablet by mouth daily.    . nortriptyline (PAMELOR) 25 MG capsule Take 25 mg by mouth 2 (two) times daily.     . pregabalin (LYRICA) 75 MG capsule Take 1 capsule (75 mg total) by mouth 3 (three) times daily. 270 capsule 3   No current  facility-administered medications on file prior to visit.   Past Medical History  Diagnosis Date  . Varicose veins   . Obesity   . Fatigue   . Chest tightness   . Myalgia   . GERD (gastroesophageal reflux disease)   . Ulcer   . Hyperlipidemia   . Hypothyroid   . Anemia   . Prediabetes    Review of Systems  Constitutional: Negative for chills and diaphoresis.  HENT: Positive for congestion, postnasal drip, sinus pressure and sneezing. Negative for ear pain and sore throat.   Respiratory: Positive for cough. Negative for chest tightness, shortness of breath and wheezing.   Cardiovascular: Negative.   Gastrointestinal: Negative.   Genitourinary: Negative.   Musculoskeletal: Negative for neck pain.  Neurological: Positive for headaches.       Objective:   Physical Exam  Constitutional: She is oriented to person, place, and time. She appears well-developed and well-nourished.  HENT:  Right Ear: Hearing and external ear normal. No mastoid tenderness. Tympanic membrane is injected. Tympanic membrane is not perforated, not erythematous, not retracted and not bulging. A middle ear effusion is present.  Left Ear: Hearing and external ear normal. No mastoid tenderness. Tympanic membrane is injected. Tympanic  membrane is not perforated, not erythematous, not retracted and not bulging. A middle ear effusion is present.  Nose: Right sinus exhibits maxillary sinus tenderness. Left sinus exhibits maxillary sinus tenderness.  Mouth/Throat: Uvula is midline, oropharynx is clear and moist and mucous membranes are normal.  Eyes: Conjunctivae and EOM are normal. Pupils are equal, round, and reactive to light.  Neck: Neck supple.  Cardiovascular: Normal rate and regular rhythm.   Pulmonary/Chest: Effort normal and breath sounds normal. No respiratory distress. She has no wheezes.  Abdominal: Soft. Bowel sounds are normal.  Musculoskeletal: Normal range of motion.  Lymphadenopathy:    She has no  cervical adenopathy.  Neurological: She is alert and oriented to person, place, and time.  Skin: Skin is warm and dry.       Assessment & Plan:  1. URI (upper respiratory infection) - azithromycin (ZITHROMAX) 250 MG tablet; Take 2 tablets (500 mg) on  Day 1,  followed by 1 tablet (250 mg) once daily on Days 2 through 5.  Dispense: 6 each; Refill: 1 - predniSONE (DELTASONE) 20 MG tablet; 2 tablets daily for 3 days, 1 tablet daily for 4 days.  Dispense: 10 tablet; Refill: 0 - fluconazole (DIFLUCAN) 150 MG tablet; Take 1 tablet (150 mg total) by mouth once.  Dispense: 1 tablet; Refill: 3 - benzonatate (TESSALON PERLES) 100 MG capsule; Take 2 capsules (200 mg total) by mouth 3 (three) times daily as needed for cough (Max: 600mg  per day (6 capsules per day)).  Dispense: 60 capsule; Refill: 0

## 2015-12-17 ENCOUNTER — Ambulatory Visit: Payer: Self-pay | Admitting: Physician Assistant

## 2015-12-31 ENCOUNTER — Ambulatory Visit (INDEPENDENT_AMBULATORY_CARE_PROVIDER_SITE_OTHER): Payer: BLUE CROSS/BLUE SHIELD | Admitting: Internal Medicine

## 2015-12-31 VITALS — BP 122/60 | HR 80 | Temp 97.8°F | Resp 16 | Ht 67.0 in | Wt 235.0 lb

## 2015-12-31 DIAGNOSIS — E118 Type 2 diabetes mellitus with unspecified complications: Secondary | ICD-10-CM | POA: Diagnosis not present

## 2015-12-31 DIAGNOSIS — Z79899 Other long term (current) drug therapy: Secondary | ICD-10-CM

## 2015-12-31 DIAGNOSIS — N182 Chronic kidney disease, stage 2 (mild): Secondary | ICD-10-CM | POA: Diagnosis not present

## 2015-12-31 DIAGNOSIS — E785 Hyperlipidemia, unspecified: Secondary | ICD-10-CM | POA: Diagnosis not present

## 2015-12-31 DIAGNOSIS — E039 Hypothyroidism, unspecified: Secondary | ICD-10-CM | POA: Diagnosis not present

## 2015-12-31 DIAGNOSIS — G9009 Other idiopathic peripheral autonomic neuropathy: Secondary | ICD-10-CM | POA: Diagnosis not present

## 2015-12-31 LAB — CBC WITH DIFFERENTIAL/PLATELET
BASOS ABS: 0 10*3/uL (ref 0.0–0.1)
Basophils Relative: 0 % (ref 0–1)
EOS ABS: 0.2 10*3/uL (ref 0.0–0.7)
Eosinophils Relative: 2 % (ref 0–5)
HCT: 39.2 % (ref 36.0–46.0)
HEMOGLOBIN: 12.7 g/dL (ref 12.0–15.0)
LYMPHS ABS: 4 10*3/uL (ref 0.7–4.0)
LYMPHS PCT: 34 % (ref 12–46)
MCH: 26.8 pg (ref 26.0–34.0)
MCHC: 32.4 g/dL (ref 30.0–36.0)
MCV: 82.7 fL (ref 78.0–100.0)
MONOS PCT: 6 % (ref 3–12)
MPV: 9.9 fL (ref 8.6–12.4)
Monocytes Absolute: 0.7 10*3/uL (ref 0.1–1.0)
NEUTROS ABS: 6.8 10*3/uL (ref 1.7–7.7)
NEUTROS PCT: 58 % (ref 43–77)
PLATELETS: 295 10*3/uL (ref 150–400)
RBC: 4.74 MIL/uL (ref 3.87–5.11)
RDW: 15.4 % (ref 11.5–15.5)
WBC: 11.7 10*3/uL — ABNORMAL HIGH (ref 4.0–10.5)

## 2015-12-31 LAB — TSH: TSH: 2.95 m[IU]/L

## 2015-12-31 NOTE — Patient Instructions (Signed)
Please start taking tumeric with curcumin extract or black pepper extract.

## 2015-12-31 NOTE — Progress Notes (Signed)
Patient ID: [name], female   DOB: 1946-11-08, 70 y.o.   MRN: [id number]  Assessment and Plan:  Hypertension:  -Continue medication -monitor blood pressure at home. -Continue DASH diet -Reminder to go to the ER if any CP, SOB, nausea, dizziness, severe HA, changes vision/speech, left arm numbness and tingling and jaw pain.  Cholesterol - Continue diet and exercise -Check cholesterol.   preDiabetes with diabetic chronic kidney disease -Continue diet and exercise.  -Check A1C  Vitamin D Def -check level -continue medications.   Continue diet and meds as discussed. Further disposition pending results of labs. Discussed med's effects and SE's.    HPI 70 y.o. female  presents for 3 month follow up with hypertension, hyperlipidemia, diabetes and vitamin D deficiency.   Her blood pressure has been controlled at home, today their BP is BP: 122/60 mmHg.She does workout. She denies chest pain, shortness of breath, dizziness.   She is on cholesterol medication and denies myalgias. Her cholesterol is at goal. The cholesterol was:  06/02/2015: Cholesterol 150; HDL 46; LDL Cholesterol 70; Triglycerides 171*   She has been working on diet and exercise for diabetes with diabetic chronic kidney disease, she is on bASA, she is on ACE/ARB, and denies  foot ulcerations, hyperglycemia, hypoglycemia , increased appetite, nausea, paresthesia of the feet, polydipsia, polyuria, visual disturbances, vomiting and weight loss. Last A1C was: 09/10/2015: Hgb A1c MFr Bld 6.3*   Patient is on Vitamin D supplement. 06/02/2015: Vit D, 25-Hydroxy 63   Doing Well.  NO current issues she wishes to discuss.  Current Medications:  Current Outpatient Prescriptions on File Prior to Visit  Medication Sig Dispense Refill  . amitriptyline (ELAVIL) 25 MG tablet TAKE 1 TABLET TWICE A DAY 180 tablet 3  . aspirin 81 MG tablet Take 81 mg by mouth daily.    Marland Kitchen atorvastatin (LIPITOR) 40 MG tablet TAKE 1 TABLET AT BEDTIME    FOR CHOLESTEROL 90 tablet 1  . b complex vitamins tablet Take 1 tablet by mouth daily.    Marland Kitchen CALCIUM PO Take 1,200 mg by mouth daily.     . cyclobenzaprine (FLEXERIL) 10 MG tablet Take 1/2 to 1 tablet three times daily as needed for muscle spasm 90 tablet 2  . diltiazem (DILACOR XR) 240 MG 24 hr capsule Take 1 capsule (240 mg total) by mouth daily. 90 capsule 3  . esomeprazole (NEXIUM) 40 MG capsule TAKE 1 CAPSULE DAILY 90 capsule 1  . estradiol (ESTRACE) 0.5 MG tablet TAKE 1 TABLET DAILY 90 tablet 3  . furosemide (LASIX) 20 MG tablet Take 1 tablet (20 mg total) by mouth 2 (two) times daily. 180 tablet 3  . levothyroxine (SYNTHROID) 125 MCG tablet TAKE 1 TABLET DAILY FOR THYROID 90 tablet 3  . meloxicam (MOBIC) 7.5 MG tablet TAKE 1 TABLET DAILY FOR    PAIN WITH FOOD 90 tablet 2  . Multiple Vitamin (MULTIVITAMIN) tablet Take 1 tablet by mouth daily.    . nortriptyline (PAMELOR) 25 MG capsule Take 25 mg by mouth 2 (two) times daily.     . pregabalin (LYRICA) 75 MG capsule Take 1 capsule (75 mg total) by mouth 3 (three) times daily. 270 capsule 3   No current facility-administered medications on file prior to visit.   Medical History:  Past Medical History  Diagnosis Date  . Varicose veins   . Obesity   . Fatigue   . Chest tightness   . Myalgia   . GERD (gastroesophageal reflux disease)   .  Ulcer   . Hyperlipidemia   . Hypothyroid   . Anemia   . Prediabetes    Allergies:  Allergies  Allergen Reactions  . Levaquin [Levofloxacin In D5w]   . Macrobid [Nitrofurantoin Macrocrystal]   . Penicillins     REACTION: rash  . Procaine Hcl     REACTION: difficulty breathing  . Sulfonamide Derivatives     REACTION: hives  . Tetanus Toxoid     REACTION: arm swells     Review of Systems:  Review of Systems  Constitutional: Negative for fever, chills and malaise/fatigue.  HENT: Negative for congestion, ear discharge and sore throat.   Respiratory: Negative for cough, shortness of breath  and wheezing.   Cardiovascular: Negative for chest pain, palpitations and leg swelling.  Gastrointestinal: Negative for heartburn, diarrhea, constipation, blood in stool and melena.  Genitourinary: Negative.   Skin: Negative.   Neurological: Negative for dizziness, loss of consciousness and headaches.  Psychiatric/Behavioral: Negative for depression. The patient is not nervous/anxious and does not have insomnia.     Family history- Review and unchanged  Social history- Review and unchanged  Physical Exam: BP 122/60 mmHg  Pulse 80  Temp(Src) 97.8 F (36.6 C) (Temporal)  Resp 16  Ht 5\' 7"  (1.702 m)  Wt 235 lb (106.595 kg)  BMI 36.80 kg/m2 Wt Readings from Last 3 Encounters:  12/31/15 235 lb (106.595 kg)  11/24/15 223 lb (101.152 kg)  11/04/15 228 lb (103.42 kg)   General Appearance: Well nourished well developed, non-toxic appearing, in no apparent distress. Eyes: PERRLA, EOMs, conjunctiva no swelling or erythema ENT/Mouth: Ear canals clear with no erythema, swelling, or discharge.  TMs normal bilaterally, oropharynx clear, moist, with no exudate.   Neck: Supple, thyroid normal, no JVD, no cervical adenopathy.  Respiratory: Respiratory effort normal, breath sounds clear A&P, no wheeze, rhonchi or rales noted.  No retractions, no accessory muscle usage Cardio: RRR with no MRGs. No noted edema.  Abdomen: Soft, + BS.  Non tender, no guarding, rebound, hernias, masses. Musculoskeletal: Full ROM, 5/5 strength, Normal gait Skin: Warm, dry without rashes, lesions, ecchymosis.  Neuro: Awake and oriented X 3, Cranial nerves intact. No cerebellar symptoms.  Psych: normal affect, Insight and Judgment appropriate.    Starlyn Skeans, PA-C 5:15 PM [facility]

## 2016-01-01 LAB — BASIC METABOLIC PANEL WITH GFR
BUN: 15 mg/dL (ref 7–25)
CHLORIDE: 103 mmol/L (ref 98–110)
CO2: 24 mmol/L (ref 20–31)
CREATININE: 0.86 mg/dL (ref 0.50–0.99)
Calcium: 8.9 mg/dL (ref 8.6–10.4)
GFR, Est African American: 81 mL/min (ref >=60)
GFR, Est Non African American: 70 mL/min (ref >=60)
Glucose, Bld: 90 mg/dL (ref 65–99)
POTASSIUM: 4.1 mmol/L (ref 3.5–5.3)
Sodium: 138 mmol/L (ref 135–146)

## 2016-01-01 LAB — LIPID PANEL
Cholesterol: 139 mg/dL (ref 125–200)
HDL: 47 mg/dL (ref >=46)
LDL CALC: 65 mg/dL (ref <130)
TRIGLYCERIDES: 136 mg/dL (ref <150)
Total CHOL/HDL Ratio: 3 Ratio (ref <=5.0)
VLDL: 27 mg/dL (ref <30)

## 2016-01-01 LAB — HEPATIC FUNCTION PANEL
ALBUMIN: 3.6 g/dL (ref 3.6–5.1)
ALK PHOS: 107 U/L (ref 33–130)
ALT: 14 U/L (ref 6–29)
AST: 14 U/L (ref 10–35)
BILIRUBIN DIRECT: 0.1 mg/dL (ref <=0.2)
BILIRUBIN TOTAL: 0.4 mg/dL (ref 0.2–1.2)
Indirect Bilirubin: 0.3 mg/dL (ref 0.2–1.2)
Total Protein: 6.3 g/dL (ref 6.1–8.1)

## 2016-01-01 LAB — HEMOGLOBIN A1C
Hgb A1c MFr Bld: 6.2 % — ABNORMAL HIGH (ref <5.7)
Mean Plasma Glucose: 131 mg/dL — ABNORMAL HIGH (ref <117)

## 2016-01-05 ENCOUNTER — Other Ambulatory Visit: Payer: Self-pay | Admitting: Physician Assistant

## 2016-01-05 MED ORDER — PREGABALIN 75 MG PO CAPS: 75.0000 mg | ORAL_CAPSULE | Freq: Three times a day (TID) | ORAL | Status: DC

## 2016-02-13 ENCOUNTER — Other Ambulatory Visit: Payer: Self-pay | Admitting: Physician Assistant

## 2016-02-13 ENCOUNTER — Other Ambulatory Visit: Payer: Self-pay | Admitting: Internal Medicine

## 2016-03-06 ENCOUNTER — Other Ambulatory Visit: Payer: Self-pay | Admitting: Internal Medicine

## 2016-03-06 ENCOUNTER — Other Ambulatory Visit: Payer: Self-pay | Admitting: Physician Assistant

## 2016-03-17 ENCOUNTER — Ambulatory Visit (INDEPENDENT_AMBULATORY_CARE_PROVIDER_SITE_OTHER): Payer: BLUE CROSS/BLUE SHIELD | Admitting: Internal Medicine

## 2016-03-17 ENCOUNTER — Encounter: Payer: Self-pay | Admitting: Internal Medicine

## 2016-03-17 VITALS — BP 160/98 | HR 80 | Temp 98.0°F | Resp 18 | Ht 67.0 in | Wt 239.0 lb

## 2016-03-17 DIAGNOSIS — M545 Low back pain, unspecified: Secondary | ICD-10-CM

## 2016-03-17 MED ORDER — DIAZEPAM 5 MG PO TABS: 5.0000 mg | ORAL_TABLET | Freq: Four times a day (QID) | ORAL | Status: DC | PRN

## 2016-03-17 MED ORDER — DEXAMETHASONE 0.75 MG PO TABS: ORAL_TABLET | ORAL | Status: DC

## 2016-03-17 NOTE — Patient Instructions (Signed)

## 2016-03-17 NOTE — Progress Notes (Signed)
Subjective:    Patient ID: [name], female    DOB: Jan 09, 1946, 70 y.o.   MRN: [id number]  Back Pain Pertinent negatives include no abdominal pain, chest pain or fever.  Patient presents to the office for evaluation of bilateral back pain.  She reports that the pain initially started about two weeks ago.  It has now spread across the low back.  She reports that the pain is not there when she is sitting, but when she moves it is like a sharp pain.  She does not wake up from sleeping.  She has no injury that she can think of.  She reports standing up is the worst.  She has not had hematuria, but has had some hesitancy.  No burning with urination.  She has a hot feeling sensation when she urinates.  No fever or chills.  No nausea or vomiting.  No history of back issues in the last 30.  No surgeries on the back.  No tingling or numbness.  No loss of bowel or bladder.  She reports no saddle anesthesia.  No cancer history, no IV drug.        Review of Systems  Constitutional: Negative for fever, chills and fatigue.  Respiratory: Negative for chest tightness and shortness of breath.   Cardiovascular: Negative for chest pain and palpitations.  Gastrointestinal: Negative for nausea, vomiting, abdominal pain, diarrhea and constipation.  Musculoskeletal: Positive for back pain.       Objective:   Physical Exam  Constitutional: She is oriented to person, place, and time. She appears well-developed and well-nourished. No distress.  HENT:  Head: Normocephalic.  Mouth/Throat: Oropharynx is clear and moist. No oropharyngeal exudate.  Eyes: Conjunctivae are normal. No scleral icterus.  Neck: Normal range of motion. Neck supple. No JVD present. No thyromegaly present.  Cardiovascular: Normal rate, regular rhythm, normal heart sounds and intact distal pulses.  Exam reveals no gallop and no friction rub.   No murmur heard. Pulmonary/Chest: Effort normal and breath sounds normal. No respiratory  distress. She has no wheezes. She has no rales. She exhibits no tenderness.  Abdominal: Soft. Bowel sounds are normal. She exhibits no distension and no mass. There is no tenderness. There is no rebound and no guarding.  Musculoskeletal: Normal range of motion.  Patient rises slowly from sitting to standing.  They walk without an antalgic gait.  There is no evidence of erythema, ecchymosis, or gross deformity.  There is tenderness to palpation over bilateral SI joints and lumbar paraspinal muscles.  There is no bony tenderness to palpation.  Active ROM is limited only with bilateral twisting.  Pain free with lateral bending, forward flexion, and back extension.  Sensation to light touch is intact over all extremities.  Strength is symmetric and equal in all extremities.  Negative straight leg raise bilaterally.    Lymphadenopathy:    She has no cervical adenopathy.  Neurological: She is alert and oriented to person, place, and time.  Skin: Skin is warm and dry. She is not diaphoretic.  Psychiatric: She has a normal mood and affect. Her behavior is normal. Judgment and thought content normal.  Nursing note and vitals reviewed.   Filed Vitals:   03/17/16 1606  BP: 160/98  Pulse: 80  Temp: 98 F (36.7 C)  Resp: 18          Assessment & Plan:    1. Bilateral low back pain without sciatica -stop flexeril - diazepam (VALIUM) 5 MG tablet; Take  1 tablet (5 mg total) by mouth every 6 (six) hours as needed for anxiety (spasms).  Dispense: 10 tablet; Refill: 0 - dexamethasone (DECADRON) 0.75 MG tablet; Take 3 tablets x 3 days, take 2 tablets x 3 days, take 1 tablet x 3 days, take 0.5 tablets x 3 days.  Dispense: 20 tablet; Refill: 0 - Urine culture - Urinalysis, Routine w reflex microscopic (not at [facility]) -doubt renal stone.  Possible infection but will wait for UA tomorrow before calling in abx. -if necessary we will send in diflucan with abx

## 2016-03-18 ENCOUNTER — Other Ambulatory Visit: Payer: Self-pay | Admitting: Internal Medicine

## 2016-03-18 LAB — URINALYSIS, ROUTINE W REFLEX MICROSCOPIC
BILIRUBIN URINE: NEGATIVE
GLUCOSE, UA: NEGATIVE
Hgb urine dipstick: NEGATIVE
KETONES UR: NEGATIVE
Nitrite: NEGATIVE
PROTEIN: NEGATIVE
Specific Gravity, Urine: 1.013 (ref 1.001–1.035)
pH: 6 (ref 5.0–8.0)

## 2016-03-18 LAB — URINALYSIS, MICROSCOPIC ONLY
CRYSTALS: NONE SEEN [HPF]
Casts: NONE SEEN [LPF]
Squamous Epithelial / LPF: NONE SEEN [HPF] (ref <=5)
WBC, UA: >60 WBC/HPF — AB (ref <=5)
YEAST: NONE SEEN [HPF]

## 2016-03-18 LAB — URINE CULTURE

## 2016-03-18 MED ORDER — CEFUROXIME AXETIL 250 MG PO TABS: 250.0000 mg | ORAL_TABLET | Freq: Two times a day (BID) | ORAL | Status: DC

## 2016-04-06 ENCOUNTER — Other Ambulatory Visit: Payer: Self-pay | Admitting: Internal Medicine

## 2016-04-11 ENCOUNTER — Ambulatory Visit (INDEPENDENT_AMBULATORY_CARE_PROVIDER_SITE_OTHER): Payer: BLUE CROSS/BLUE SHIELD | Admitting: Physician Assistant

## 2016-04-11 ENCOUNTER — Encounter: Payer: Self-pay | Admitting: Physician Assistant

## 2016-04-11 VITALS — BP 134/70 | HR 78 | Temp 97.6°F | Resp 14 | Wt 235.0 lb

## 2016-04-11 DIAGNOSIS — R350 Frequency of micturition: Secondary | ICD-10-CM | POA: Diagnosis not present

## 2016-04-11 DIAGNOSIS — L97519 Non-pressure chronic ulcer of other part of right foot with unspecified severity: Secondary | ICD-10-CM | POA: Diagnosis not present

## 2016-04-11 MED ORDER — FLUCONAZOLE 150 MG PO TABS: 150.0000 mg | ORAL_TABLET | Freq: Every day | ORAL | Status: DC

## 2016-04-11 MED ORDER — DOXYCYCLINE HYCLATE 100 MG PO TABS: 100.0000 mg | ORAL_TABLET | Freq: Two times a day (BID) | ORAL | Status: DC

## 2016-04-11 NOTE — Progress Notes (Signed)
Subjective:    Patient ID: [name], female    DOB: 10-22-1946, 70 y.o.   MRN: [id number]  HPI 70 y.o. WF presents with right 3rd toe pain x Thursday. States she started to have toe pain Thursday, wore new shoes, no injury, started to have lateral blister 3rd right toe. Has callous on toes as well. Started to have drainage from the toe yesterday, denies fever, chills. Has had bilateral feet swelling around Thursday as well, has increase to two a day of the lasix.   Treated for UTI in April and needs repeat urine.  Wt Readings from Last 3 Encounters:  04/11/16 235 lb (106.595 kg)  03/17/16 239 lb (108.41 kg)  12/31/15 235 lb (106.595 kg)    Blood pressure 134/70, pulse 78, temperature 97.6 F (36.4 C), temperature source Temporal, resp. rate 14, weight 235 lb (106.595 kg).  Current Outpatient Prescriptions on File Prior to Visit  Medication Sig Dispense Refill  . amitriptyline (ELAVIL) 25 MG tablet TAKE 1 TABLET TWICE A DAY 180 tablet 1  . aspirin 81 MG tablet Take 81 mg by mouth daily.    Marland Kitchen atorvastatin (LIPITOR) 40 MG tablet TAKE 1 TABLET AT BEDTIME   FOR CHOLESTEROL 90 tablet 1  . b complex vitamins tablet Take 1 tablet by mouth daily.    Marland Kitchen CALCIUM PO Take 1,200 mg by mouth daily.     Marland Kitchen dexamethasone (DECADRON) 0.75 MG tablet Take 3 tablets x 3 days, take 2 tablets x 3 days, take 1 tablet x 3 days, take 0.5 tablets x 3 days. 20 tablet 0  . diazepam (VALIUM) 5 MG tablet Take 1 tablet (5 mg total) by mouth every 6 (six) hours as needed for anxiety (spasms). 10 tablet 0  . diltiazem (DILACOR XR) 240 MG 24 hr capsule Take 1 capsule (240 mg total) by mouth daily. 90 capsule 3  . esomeprazole (NEXIUM) 40 MG capsule TAKE 1 CAPSULE DAILY 90 capsule 1  . estradiol (ESTRACE) 0.5 MG tablet TAKE 1 TABLET DAILY 90 tablet 1  . furosemide (LASIX) 20 MG tablet Take 1 tablet (20 mg total) by mouth 2 (two) times daily. 180 tablet 3  . levothyroxine (SYNTHROID) 125 MCG tablet TAKE 1 TABLET DAILY  FOR THYROID 90 tablet 3  . meloxicam (MOBIC) 7.5 MG tablet TAKE 1 TABLET DAILY WITH   FOOD FOR PAIN 90 tablet 1  . Multiple Vitamin (MULTIVITAMIN) tablet Take 1 tablet by mouth daily.    . nortriptyline (PAMELOR) 25 MG capsule Take 25 mg by mouth 2 (two) times daily.     . Omega-3 Fatty Acids (OMEGA 3 PO) Take 1,000 mg by mouth daily.    . pregabalin (LYRICA) 75 MG capsule Take 1 capsule (75 mg total) by mouth 3 (three) times daily. 270 capsule 3   No current facility-administered medications on file prior to visit.    Review of Systems  Constitutional: Negative.   HENT: Negative.   Respiratory: Negative.   Cardiovascular: Positive for leg swelling.  Gastrointestinal: Negative.   Genitourinary: Negative.   Musculoskeletal: Negative.   Skin: Positive for wound.  Neurological: Negative.        Objective:   Physical Exam  Constitutional: She is oriented to person, place, and time. She appears well-developed and well-nourished. No distress.  HENT:  Head: Normocephalic.  Right Ear: External ear normal.  Left Ear: External ear normal.  Nose: Nose normal.  Mouth/Throat: Oropharynx is clear and moist.  Eyes: Conjunctivae are normal. Pupils  are equal, round, and reactive to light.  Neck: Normal range of motion. Neck supple. No thyromegaly present.  Cardiovascular: Normal rate, regular rhythm and normal heart sounds.   No murmur heard. Pulses:      Dorsalis pedis pulses are 1+ on the right side, and 2+ on the left side.       Posterior tibial pulses are 2+ on the right side, and 2+ on the left side.  Pulmonary/Chest: Effort normal and breath sounds normal. She has no wheezes.  Abdominal: Soft. Bowel sounds are normal. There is no tenderness.  Musculoskeletal:   Antalgic gait.   Lymphadenopathy:    She has no cervical adenopathy.  Neurological: She is alert and oriented to person, place, and time. She has normal reflexes. No cranial nerve deficit. Coordination normal.  Skin: Skin  is warm and dry. No rash noted.  + varicose veins and mild edema bilateral legs.  Right 3rd toe with erythema, decreased cap refill, blister on lateral toe, with callus with black/red and pus at the end of the toe, no streaking, minimal warmth.   Psychiatric: She has a normal mood and affect.      Assessment & Plan:  1. Toe ulcer, right, with unspecified severity (HCC) - doxycycline (VIBRA-TABS) 100 MG tablet; Take 1 tablet (100 mg total) by mouth 2 (two) times daily.  Dispense: 20 tablet; Refill: 0 - VAS [place] ABI WITH/WO TBI; Future - fluconazole (DIFLUCAN) 150 MG tablet; Take 1 tablet (150 mg total) by mouth daily.  Dispense: 1 tablet; Refill: 3  2. Urinary frequency - Urinalysis, Routine w reflex microscopic (not at [facility]) - Urine culture  Future Appointments Date Time Provider [place]  04/21/2016 4:30 PM [facility] NURSE [facility] None  06/06/2016 3:00 PM Vicie Mutters, PA-C [facility] None

## 2016-04-12 LAB — URINALYSIS, ROUTINE W REFLEX MICROSCOPIC
Bilirubin Urine: NEGATIVE
GLUCOSE, UA: NEGATIVE
Hgb urine dipstick: NEGATIVE
Ketones, ur: NEGATIVE
LEUKOCYTES UA: NEGATIVE
Nitrite: NEGATIVE
PH: 7 (ref 5.0–8.0)
Protein, ur: NEGATIVE
SPECIFIC GRAVITY, URINE: 1.011 (ref 1.001–1.035)

## 2016-04-13 LAB — URINE CULTURE

## 2016-04-14 ENCOUNTER — Other Ambulatory Visit: Payer: Self-pay | Admitting: Internal Medicine

## 2016-04-14 ENCOUNTER — Telehealth: Payer: Self-pay

## 2016-04-14 ENCOUNTER — Other Ambulatory Visit: Payer: Self-pay

## 2016-04-14 MED ORDER — NITROFURANTOIN MONOHYD MACRO 100 MG PO CAPS: 100.0000 mg | ORAL_CAPSULE | Freq: Two times a day (BID) | ORAL | Status: DC

## 2016-04-14 NOTE — Telephone Encounter (Signed)
Spoke with [name] about starting Macrobid 100 mg caps for her UTI. Pt stated that she can not remember the reaction she had to [facility] but she would be willing to take it now & if she has any problems w/ the Macrobid she will call the office.

## 2016-04-21 ENCOUNTER — Ambulatory Visit: Payer: Self-pay

## 2016-05-11 ENCOUNTER — Ambulatory Visit: Payer: Self-pay | Admitting: Physician Assistant

## 2016-05-14 ENCOUNTER — Other Ambulatory Visit: Payer: Self-pay | Admitting: Internal Medicine

## 2016-05-19 ENCOUNTER — Encounter: Payer: Self-pay | Admitting: Physician Assistant

## 2016-05-19 ENCOUNTER — Ambulatory Visit (INDEPENDENT_AMBULATORY_CARE_PROVIDER_SITE_OTHER): Payer: BLUE CROSS/BLUE SHIELD | Admitting: Physician Assistant

## 2016-05-19 VITALS — BP 120/64 | HR 83 | Temp 97.5°F | Resp 14 | Ht 67.0 in | Wt 244.8 lb

## 2016-05-19 DIAGNOSIS — Z79899 Other long term (current) drug therapy: Secondary | ICD-10-CM | POA: Diagnosis not present

## 2016-05-19 DIAGNOSIS — E118 Type 2 diabetes mellitus with unspecified complications: Secondary | ICD-10-CM | POA: Diagnosis not present

## 2016-05-19 DIAGNOSIS — R0600 Dyspnea, unspecified: Secondary | ICD-10-CM

## 2016-05-19 DIAGNOSIS — E785 Hyperlipidemia, unspecified: Secondary | ICD-10-CM | POA: Diagnosis not present

## 2016-05-19 DIAGNOSIS — M545 Low back pain, unspecified: Secondary | ICD-10-CM

## 2016-05-19 DIAGNOSIS — N182 Chronic kidney disease, stage 2 (mild): Secondary | ICD-10-CM | POA: Diagnosis not present

## 2016-05-19 LAB — CBC WITH DIFFERENTIAL/PLATELET
BASOS ABS: 0 {cells}/uL (ref 0–200)
Basophils Relative: 0 %
EOS ABS: 232 {cells}/uL (ref 15–500)
Eosinophils Relative: 2 %
HEMATOCRIT: 38.2 % (ref 35.0–45.0)
HEMOGLOBIN: 12.5 g/dL (ref 11.7–15.5)
LYMPHS ABS: 3364 {cells}/uL (ref 850–3900)
Lymphocytes Relative: 29 %
MCH: 26.5 pg — ABNORMAL LOW (ref 27.0–33.0)
MCHC: 32.7 g/dL (ref 32.0–36.0)
MCV: 80.9 fL (ref 80.0–100.0)
MONO ABS: 928 {cells}/uL (ref 200–950)
MONOS PCT: 8 %
MPV: 9.8 fL (ref 7.5–12.5)
NEUTROS ABS: 7076 {cells}/uL (ref 1500–7800)
Neutrophils Relative %: 61 %
Platelets: 284 10*3/uL (ref 140–400)
RBC: 4.72 MIL/uL (ref 3.80–5.10)
RDW: 16.1 % — ABNORMAL HIGH (ref 11.0–15.0)
WBC: 11.6 10*3/uL — ABNORMAL HIGH (ref 3.8–10.8)

## 2016-05-19 LAB — HEPATIC FUNCTION PANEL
ALK PHOS: 97 U/L (ref 33–130)
ALT: 22 U/L (ref 6–29)
AST: 20 U/L (ref 10–35)
Albumin: 3.6 g/dL (ref 3.6–5.1)
BILIRUBIN DIRECT: 0.1 mg/dL (ref <=0.2)
BILIRUBIN INDIRECT: 0.3 mg/dL (ref 0.2–1.2)
BILIRUBIN TOTAL: 0.4 mg/dL (ref 0.2–1.2)
Total Protein: 6.3 g/dL (ref 6.1–8.1)

## 2016-05-19 LAB — BASIC METABOLIC PANEL WITH GFR
BUN: 14 mg/dL (ref 7–25)
CO2: 25 mmol/L (ref 20–31)
Calcium: 8.9 mg/dL (ref 8.6–10.4)
Chloride: 104 mmol/L (ref 98–110)
Creat: 0.88 mg/dL (ref 0.50–0.99)
GFR, EST AFRICAN AMERICAN: 78 mL/min (ref >=60)
GFR, EST NON AFRICAN AMERICAN: 68 mL/min (ref >=60)
GLUCOSE: 82 mg/dL (ref 65–99)
POTASSIUM: 3.7 mmol/L (ref 3.5–5.3)
SODIUM: 141 mmol/L (ref 135–146)

## 2016-05-19 LAB — MAGNESIUM: MAGNESIUM: 1.9 mg/dL (ref 1.5–2.5)

## 2016-05-19 NOTE — Patient Instructions (Addendum)
[facility]  7 a.m.-6:30 p.m., Monday 7 a.m.-5 p.m., Tuesday-Friday Schedule an appointment by calling [phone].  Continue the lasix 40mg  daily, I'm checking your kidney function and a lab test that will tell me if you have too much fluid on you Get compression stockings and elevated feet   Do the following things EVERYDAY: 1) Weigh yourself in the morning before breakfast. Write it down and keep it in a log. 2) Take your medicines as prescribed 3) Eat low salt foods-Limit salt (sodium) to 2000 mg per day. Best thing to do is avoid processed foods.   4) Stay as active as you can everyday 5) Limit all fluids for the day to less than 2 liters  Call your doctor if:  Anytime you have any of the following symptoms:  1) 3 pound weight gain in 24 hours or 5 pounds in 1 week  2) shortness of breath, with or without a dry hacking cough  3) swelling in the hands, feet or stomach  4) if you have to sleep on extra pillows at night in order to breathe. 5) after laying down at night for 20-30 mins, you wake up short of breath.   These can all be signs of fluid overload.    HOME CARE INSTRUCTIONS   Do not stand or sit in one position for long periods of time. Do not sit with your legs crossed. Rest with your legs raised during the day.  Your legs have to be higher than your heart so that gravity will force the valves to open, so please really elevate your legs.   Wear elastic stockings or support hose. Do not wear other tight, encircling garments around the legs, pelvis, or waist.  [employer]  has a wide variety of well priced compression stockings. [place], [place] [phone]  Walk as much as possible to increase blood flow.  Raise the foot of your bed at night with 2-inch blocks. SEEK MEDICAL CARE IF:   The skin around your ankle starts to break down.  You have pain, redness, tenderness, or hard swelling developing in  your leg over a vein.  You are uncomfortable due to leg pain. Document Released: 08/17/2005 Document Revised: 01/30/2012 Document Reviewed: 01/03/2011 [facility] Patient Information 2014 [place].

## 2016-05-19 NOTE — Progress Notes (Signed)
Subjective:    Patient ID: [name], female    DOB: 1946-07-30, 70 y.o.   MRN: [id number]  HPI 69 y.o. WF presents for follow up for a toe ulcer, she was put on doxy and states that it has improved, no more pain at that toe. She continues to have swelling bilateral, weight is up 9 lbs, some mild dyspnea with exertion, no CP, orthopnea, PND. She has been on 40mg  daily of the lasix without any help with the swelling.  Wt Readings from Last 3 Encounters:  05/19/16 244 lb 12.8 oz (111.041 kg)  04/11/16 235 lb (106.595 kg)  03/17/16 239 lb (108.41 kg)    Blood pressure 120/64, pulse 83, temperature 97.5 F (36.4 C), temperature source Temporal, resp. rate 14, height 5\' 7"  (1.702 m), weight 244 lb 12.8 oz (111.041 kg), SpO2 97 %.  Current Outpatient Prescriptions on File Prior to Visit  Medication Sig Dispense Refill  . amitriptyline (ELAVIL) 25 MG tablet TAKE 1 TABLET TWICE A DAY 180 tablet 1  . aspirin 81 MG tablet Take 81 mg by mouth daily.    Marland Kitchen atorvastatin (LIPITOR) 40 MG tablet TAKE 1 TABLET AT BEDTIME   FOR CHOLESTEROL 90 tablet 1  . b complex vitamins tablet Take 1 tablet by mouth daily.    Marland Kitchen CALCIUM PO Take 1,200 mg by mouth daily.     . cyclobenzaprine (FLEXERIL) 10 MG tablet TAKE 1/2 TO 1 TABLET 3     TIMES A DAY AS NEEDED FOR  MUSCLE SPASM 90 tablet 0  . diltiazem (DILACOR XR) 240 MG 24 hr capsule Take 1 capsule (240 mg total) by mouth daily. 90 capsule 3  . esomeprazole (NEXIUM) 40 MG capsule TAKE 1 CAPSULE DAILY 90 capsule 1  . estradiol (ESTRACE) 0.5 MG tablet TAKE 1 TABLET DAILY 90 tablet 1  . fluconazole (DIFLUCAN) 150 MG tablet Take 1 tablet (150 mg total) by mouth daily. 1 tablet 3  . furosemide (LASIX) 20 MG tablet Take 1 tablet (20 mg total) by mouth 2 (two) times daily. 180 tablet 3  . levothyroxine (SYNTHROID) 125 MCG tablet TAKE 1 TABLET DAILY FOR THYROID 90 tablet 3  . meloxicam (MOBIC) 7.5 MG tablet TAKE 1 TABLET DAILY WITH   FOOD FOR PAIN 90 tablet 1  .  Multiple Vitamin (MULTIVITAMIN) tablet Take 1 tablet by mouth daily.    . nitrofurantoin, macrocrystal-monohydrate, (MACROBID) 100 MG capsule Take 1 capsule (100 mg total) by mouth 2 (two) times daily. 14 capsule 0  . nortriptyline (PAMELOR) 25 MG capsule Take 25 mg by mouth 2 (two) times daily.     . Omega-3 Fatty Acids (OMEGA 3 PO) Take 1,000 mg by mouth daily.    . pregabalin (LYRICA) 75 MG capsule Take 1 capsule (75 mg total) by mouth 3 (three) times daily. 270 capsule 3   No current facility-administered medications on file prior to visit.    Review of Systems  Constitutional: Negative.   HENT: Negative.   Respiratory: Positive for shortness of breath. Negative for cough, chest tightness and wheezing.   Cardiovascular: Positive for leg swelling (bilateral legs). Negative for chest pain and palpitations.  Gastrointestinal: Negative.   Genitourinary: Negative.   Musculoskeletal: Negative.   Skin: Negative for wound.  Neurological: Negative.        Objective:   Physical Exam  Constitutional: She is oriented to person, place, and time. She appears well-developed and well-nourished. No distress.  HENT:  Head: Normocephalic.  Right Ear:  External ear normal.  Left Ear: External ear normal.  Nose: Nose normal.  Mouth/Throat: Oropharynx is clear and moist.  Eyes: Conjunctivae are normal. Pupils are equal, round, and reactive to light.  Neck: Normal range of motion. Neck supple. No thyromegaly present.  Cardiovascular: Normal rate, regular rhythm and normal heart sounds.   No murmur heard. Pulmonary/Chest: Effort normal and breath sounds normal. She has no wheezes.  Abdominal: Soft. Bowel sounds are normal. There is no tenderness.  Musculoskeletal:   Antalgic gait.   Lymphadenopathy:    She has no cervical adenopathy.  Neurological: She is alert and oriented to person, place, and time. She has normal reflexes. No cranial nerve deficit. Coordination normal.  Skin: Skin is warm and  dry. No rash noted.  + varicose veins and 2+ bilateral legs.  Right 3rd toe with callus with black healing area, nontender, no warmth, no swelling, no redness.   Psychiatric: She has a normal mood and affect.      Assessment & Plan:  1. Toe ulcer, right, with unspecified severity (HCC) Improved, finish ABX, wear different shoes  1. Controlled type 2 diabetes mellitus with complication, without long-term current use of insulin (HCC) Will check at CPE  2. CKD (chronic kidney disease) stage 2, GFR 60-89 ml/min With increase in lasix, check - CBC with Differential/Platelet - BASIC METABOLIC PANEL WITH GFR - Hepatic function panel  3. Hyperlipidemia - CBC with Differential/Platelet - BASIC METABOLIC PANEL WITH GFR - Hepatic function panel - TSH  4. Medication management - Magnesium  5. Dyspnea No orothopea, PND, check labs, continue lasix 40mg  - TSH - Brain natriuretic peptide [id number]  6. Bilateral low back pain without sciatica - Urinalysis, Routine w reflex microscopic (not at [facility]) - Urine culture   Future Appointments Date Time Provider [place]  06/06/2016 3:00 PM Vicie Mutters, PA-C [id number] None

## 2016-05-20 LAB — URINALYSIS, ROUTINE W REFLEX MICROSCOPIC
Bilirubin Urine: NEGATIVE
Glucose, UA: NEGATIVE
Hgb urine dipstick: NEGATIVE
Ketones, ur: NEGATIVE
LEUKOCYTES UA: NEGATIVE
NITRITE: NEGATIVE
PROTEIN: NEGATIVE
SPECIFIC GRAVITY, URINE: 1.012 (ref 1.001–1.035)
pH: 6.5 (ref 5.0–8.0)

## 2016-05-20 LAB — BRAIN NATRIURETIC PEPTIDE: Brain Natriuretic Peptide: 30.6 pg/mL (ref <100)

## 2016-05-20 LAB — TSH: TSH: 1.51 mIU/L

## 2016-05-21 ENCOUNTER — Other Ambulatory Visit: Payer: Self-pay | Admitting: Internal Medicine

## 2016-05-21 LAB — URINE CULTURE
COLONY COUNT: NO GROWTH
ORGANISM ID, BACTERIA: NO GROWTH

## 2016-06-06 ENCOUNTER — Encounter: Payer: Self-pay | Admitting: Physician Assistant

## 2016-06-08 ENCOUNTER — Other Ambulatory Visit: Payer: Self-pay | Admitting: Physician Assistant

## 2016-06-13 ENCOUNTER — Other Ambulatory Visit: Payer: Self-pay | Admitting: Internal Medicine

## 2016-07-07 ENCOUNTER — Other Ambulatory Visit: Payer: Self-pay | Admitting: Internal Medicine

## 2016-07-07 ENCOUNTER — Other Ambulatory Visit: Payer: Self-pay | Admitting: Physician Assistant

## 2016-07-26 ENCOUNTER — Ambulatory Visit (INDEPENDENT_AMBULATORY_CARE_PROVIDER_SITE_OTHER): Payer: BLUE CROSS/BLUE SHIELD | Admitting: Physician Assistant

## 2016-07-26 ENCOUNTER — Encounter: Payer: Self-pay | Admitting: Physician Assistant

## 2016-07-26 VITALS — BP 142/78 | HR 86 | Temp 98.0°F | Resp 16 | Ht 67.0 in | Wt 246.0 lb

## 2016-07-26 DIAGNOSIS — K5730 Diverticulosis of large intestine without perforation or abscess without bleeding: Secondary | ICD-10-CM

## 2016-07-26 DIAGNOSIS — E2839 Other primary ovarian failure: Secondary | ICD-10-CM

## 2016-07-26 DIAGNOSIS — Z87891 Personal history of nicotine dependence: Secondary | ICD-10-CM

## 2016-07-26 DIAGNOSIS — Z1159 Encounter for screening for other viral diseases: Secondary | ICD-10-CM

## 2016-07-26 DIAGNOSIS — G9009 Other idiopathic peripheral autonomic neuropathy: Secondary | ICD-10-CM | POA: Diagnosis not present

## 2016-07-26 DIAGNOSIS — R6889 Other general symptoms and signs: Secondary | ICD-10-CM

## 2016-07-26 DIAGNOSIS — Z23 Encounter for immunization: Secondary | ICD-10-CM

## 2016-07-26 DIAGNOSIS — E559 Vitamin D deficiency, unspecified: Secondary | ICD-10-CM | POA: Diagnosis not present

## 2016-07-26 DIAGNOSIS — K2100 Gastro-esophageal reflux disease with esophagitis, without bleeding: Secondary | ICD-10-CM

## 2016-07-26 DIAGNOSIS — D649 Anemia, unspecified: Secondary | ICD-10-CM | POA: Diagnosis not present

## 2016-07-26 DIAGNOSIS — N949 Unspecified condition associated with female genital organs and menstrual cycle: Secondary | ICD-10-CM

## 2016-07-26 DIAGNOSIS — E118 Type 2 diabetes mellitus with unspecified complications: Secondary | ICD-10-CM

## 2016-07-26 DIAGNOSIS — E785 Hyperlipidemia, unspecified: Secondary | ICD-10-CM

## 2016-07-26 DIAGNOSIS — E039 Hypothyroidism, unspecified: Secondary | ICD-10-CM | POA: Diagnosis not present

## 2016-07-26 DIAGNOSIS — K648 Other hemorrhoids: Secondary | ICD-10-CM

## 2016-07-26 DIAGNOSIS — Z79899 Other long term (current) drug therapy: Secondary | ICD-10-CM | POA: Diagnosis not present

## 2016-07-26 DIAGNOSIS — I872 Venous insufficiency (chronic) (peripheral): Secondary | ICD-10-CM

## 2016-07-26 DIAGNOSIS — D2260 Melanocytic nevi of unspecified upper limb, including shoulder: Secondary | ICD-10-CM

## 2016-07-26 DIAGNOSIS — Z0001 Encounter for general adult medical examination with abnormal findings: Secondary | ICD-10-CM

## 2016-07-26 DIAGNOSIS — IMO0002 Reserved for concepts with insufficient information to code with codable children: Secondary | ICD-10-CM

## 2016-07-26 DIAGNOSIS — L723 Sebaceous cyst: Secondary | ICD-10-CM

## 2016-07-26 DIAGNOSIS — N182 Chronic kidney disease, stage 2 (mild): Secondary | ICD-10-CM

## 2016-07-26 LAB — CBC WITH DIFFERENTIAL/PLATELET
BASOS PCT: 0 %
Basophils Absolute: 0 cells/uL (ref 0–200)
EOS ABS: 234 {cells}/uL (ref 15–500)
Eosinophils Relative: 2 %
HCT: 38.9 % (ref 35.0–45.0)
HEMOGLOBIN: 12.7 g/dL (ref 11.7–15.5)
LYMPHS ABS: 3510 {cells}/uL (ref 850–3900)
Lymphocytes Relative: 30 %
MCH: 27.1 pg (ref 27.0–33.0)
MCHC: 32.6 g/dL (ref 32.0–36.0)
MCV: 83.1 fL (ref 80.0–100.0)
MONO ABS: 702 {cells}/uL (ref 200–950)
MONOS PCT: 6 %
MPV: 9.4 fL (ref 7.5–12.5)
NEUTROS ABS: 7254 {cells}/uL (ref 1500–7800)
Neutrophils Relative %: 62 %
PLATELETS: 299 10*3/uL (ref 140–400)
RBC: 4.68 MIL/uL (ref 3.80–5.10)
RDW: 15.9 % — ABNORMAL HIGH (ref 11.0–15.0)
WBC: 11.7 10*3/uL — ABNORMAL HIGH (ref 3.8–10.8)

## 2016-07-26 LAB — HEPATIC FUNCTION PANEL
ALBUMIN: 4 g/dL (ref 3.6–5.1)
ALK PHOS: 110 U/L (ref 33–130)
ALT: 27 U/L (ref 6–29)
AST: 28 U/L (ref 10–35)
BILIRUBIN DIRECT: 0.1 mg/dL (ref <=0.2)
BILIRUBIN TOTAL: 0.5 mg/dL (ref 0.2–1.2)
Indirect Bilirubin: 0.4 mg/dL (ref 0.2–1.2)
Total Protein: 6.8 g/dL (ref 6.1–8.1)

## 2016-07-26 LAB — BASIC METABOLIC PANEL WITH GFR
BUN: 14 mg/dL (ref 7–25)
CHLORIDE: 100 mmol/L (ref 98–110)
CO2: 22 mmol/L (ref 20–31)
Calcium: 9.1 mg/dL (ref 8.6–10.4)
Creat: 0.86 mg/dL (ref 0.50–0.99)
GFR, Est African American: 80 mL/min (ref >=60)
GFR, Est Non African American: 70 mL/min (ref >=60)
Glucose, Bld: 77 mg/dL (ref 65–99)
POTASSIUM: 3.7 mmol/L (ref 3.5–5.3)
Sodium: 138 mmol/L (ref 135–146)

## 2016-07-26 LAB — LIPID PANEL
CHOL/HDL RATIO: 3 ratio (ref <=5.0)
Cholesterol: 142 mg/dL (ref 125–200)
HDL: 48 mg/dL (ref >=46)
LDL CALC: 68 mg/dL (ref <130)
Triglycerides: 130 mg/dL (ref <150)
VLDL: 26 mg/dL (ref <30)

## 2016-07-26 LAB — MAGNESIUM: Magnesium: 2 mg/dL (ref 1.5–2.5)

## 2016-07-26 NOTE — Progress Notes (Signed)
Complete Physical   Assessment and Plan:  diabetes Discussed general issues about diabetes pathophysiology and management., Educational material distributed., Suggested low cholesterol diet., Encouraged aerobic exercise., Discussed foot care., Reminded to get yearly retinal exam. - CBC with Differential/Platelet - BASIC METABOLIC PANEL WITH GFR - Hepatic function panel - Hemoglobin A1c - Insulin, fasting - Urinalysis, Routine w reflex microscopic (not at [facility]) - Microalbumin / creatinine urine ratio - EKG 12-Lead  Hypothyroidism, unspecified hypothyroidism type Hypothyroidism-check TSH level, continue medications the same, reminded to take on an empty stomach 30-3mins before food.  - TSH  Hyperlipidemia -continue medications, check lipids, decrease fatty foods, increase activity.  - Lipid panel   Obesity Obesity with co morbidities- long discussion about weight loss, diet, and exercise  Anemia, unspecified anemia type - Iron and TIBC - Ferritin - Vitamin B12  Gastroesophageal reflux disease with esophagitis Continue PPI/H2 blocker, diet discussed  Diverticulosis of large intestine without hemorrhage monitor   Internal hemorrhoids remission  Chronic venous insufficiency Varicose veins- weight loss discussed, continue compression stockings and elevation  History of tobacco use - EKG 12-Lead   CKD (chronic kidney disease) stage 2, GFR 60-89 ml/min Increase fluids, avoid NSAIDS, monitor sugars, will monitor  Peripheral autonomic neuropathy of unknown cause Continue medications, get better control of sugar  Vitamin D deficiency - Vit D  25 hydroxy (rtn osteoporosis monitoring)  Medication management - Magnesium  Sebaceous cyst -     Ambulatory referral to Dermatology  Screening for viral disease -     Hepatitis C antibody  Need for prophylactic vaccination and inoculation against influenza -     Flu vaccine HIGH DOSE PF (Fluzone High dose)  Atypical nevus  of deltoid region, unspecified laterality -     Ambulatory referral to Dermatology  Cystocele -     Ambulatory referral to Gynecology  Vaginal lump -     Ambulatory referral to Gynecology     Discussed med's effects and SE's. Screening labs and tests as requested with regular follow-up as recommended.  HPI 70 y.o. female  presents for a complete physical. Her blood pressure has been controlled at home, today their BP is BP: (!) 142/78 She does not workout. She denies chest pain, shortness of breath, dizziness.  She is on cholesterol medication, lipitor 40mg  QD and denies myalgias. Her cholesterol is at goal. The cholesterol last visit was:   Lab Results  Component Value Date   CHOL 139 12/31/2015   HDL 47 12/31/2015   LDLCALC 65 12/31/2015   TRIG 136 12/31/2015   CHOLHDL 3.0 12/31/2015   She has been working on diet and exercise for diabetes and denies polydipsia and polyuria. CKD stage 2 due to lasix/HTN. She has bilateral paraesthsias, idopathic and takes lyrica/elavil. Last A1C in the office was:  Lab Results  Component Value Date   HGBA1C 6.2 (H) 12/31/2015   Patient is on Vitamin D supplement.   Lab Results  Component Value Date   VD25OH 56 06/02/2015   She is on thyroid medication, takes 142mcg QD. Her medication was not changed last visit. Patient denies nervousness and weight changes.  Lab Results  Component Value Date   TSH 1.51 05/19/2016   She is on estrogen and bASA which she states is very helpful. She has seen urology in the past for a cystocele but states it has gotten worse and that there is a "string without something on the end".  BMI is Body mass index is 38.53 kg/m., she is working  on diet and exercise. Wt Readings from Last 3 Encounters:  07/26/16 246 lb (111.6 kg)  05/19/16 244 lb 12.8 oz (111 kg)  04/11/16 235 lb (106.6 kg)     Current Medications:  Current Outpatient Prescriptions on File Prior to Visit  Medication Sig Dispense Refill  .  amitriptyline (ELAVIL) 25 MG tablet TAKE 1 TABLET TWICE A DAY 180 tablet 1  . aspirin 81 MG tablet Take 81 mg by mouth daily.    Marland Kitchen atorvastatin (LIPITOR) 40 MG tablet TAKE 1 TABLET AT BEDTIME   FOR CHOLESTEROL 90 tablet 1  . b complex vitamins tablet Take 1 tablet by mouth daily.    Marland Kitchen CALCIUM PO Take 1,200 mg by mouth daily.     . cyclobenzaprine (FLEXERIL) 10 MG tablet TAKE 1/2 TO 1 TABLET 3     TIMES A DAY AS NEEDED FOR  MUSCLE SPASM 90 tablet 0  . diltiazem (DILACOR XR) 240 MG 24 hr capsule TAKE 1 CAPSULE DAILY 90 capsule 1  . esomeprazole (NEXIUM) 40 MG capsule TAKE 1 CAPSULE DAILY 90 capsule 1  . estradiol (ESTRACE) 0.5 MG tablet TAKE 1 TABLET DAILY 90 tablet 1  . fluconazole (DIFLUCAN) 150 MG tablet Take 1 tablet (150 mg total) by mouth daily. 1 tablet 3  . furosemide (LASIX) 20 MG tablet Take 1 tablet (20 mg total) by mouth 2 (two) times daily. 180 tablet 3  . meloxicam (MOBIC) 7.5 MG tablet TAKE 1 TABLET DAILY WITH   FOOD FOR PAIN 90 tablet 1  . Multiple Vitamin (MULTIVITAMIN) tablet Take 1 tablet by mouth daily.    . nortriptyline (PAMELOR) 25 MG capsule Take 25 mg by mouth 2 (two) times daily.     . Omega-3 Fatty Acids (OMEGA 3 PO) Take 1,000 mg by mouth daily.    . pregabalin (LYRICA) 75 MG capsule Take 1 capsule (75 mg total) by mouth 3 (three) times daily. 270 capsule 3  . SYNTHROID 125 MCG tablet TAKE 1 TABLET DAILY FOR    THYROID 90 tablet 0   No current facility-administered medications on file prior to visit.    Health Maintenance:   Immunization History  Administered Date(s) Administered  . Influenza, High Dose Seasonal PF 08/19/2014, 09/10/2015  . Pneumococcal Conjugate-13 05/12/2014  . Pneumococcal Polysaccharide-23 09/05/2007, 06/02/2015   Tetanus: ALLERGY Pneumovax: 2016 Prevnar 2015 Flu vaccine: TODAY Zostavax: N/A will check with insurance  Pap: 02/2011 normal declines another MGM: 07/2015 due  DEXA: 07/2012 normal Colonoscopy: 2011  Due 2021 EGD: n/a CT  AB 2011 Heart cath June 1999, no blockages, coronary spasms  Dr. Nicki Reaper for eye care, checking for glaucoma  Patient Care Team: Unk Pinto, MD as PCP - General (Internal Medicine) Lafayette Dragon, MD as Consulting Physician (Gastroenterology) Darleen Crocker, MD as Consulting Physician (Ophthalmology) Macarthur Critchley, [place] as Referring Physician (Optometry) Crista Luria, MD as Consulting Physician (Dermatology) Ninetta Lights, MD as Consulting Physician (Orthopedic Surgery) Minus Breeding, MD as Consulting Physician (Cardiology)   Medical History:  Past Medical History:  Diagnosis Date  . Anemia   . Chest tightness   . Fatigue   . GERD (gastroesophageal reflux disease)   . Hyperlipidemia   . Hypothyroid   . Myalgia   . Obesity   . Prediabetes   . Ulcer   . Varicose veins    Allergies Allergies  Allergen Reactions  . Levaquin [Levofloxacin In D5w]   . Macrobid [Nitrofurantoin Macrocrystal]   . Penicillins     REACTION: rash  .  Procaine Hcl     REACTION: difficulty breathing  . Sulfonamide Derivatives     REACTION: hives  . Tetanus Toxoid     REACTION: arm swells    SURGICAL HISTORY She  has a past surgical history that includes Abdominal hysterectomy; Tonsillectomy and adenoidectomy; Cataract extraction; Nasal sinus surgery; and Rotator cuff repair (Left, more than 7 years ago). FAMILY HISTORY Her family history includes Coronary artery disease (age of onset: 53) in her father. SOCIAL HISTORY She  reports that she quit smoking about 3 years ago. Her smoking use included Cigarettes. She has a 23.00 pack-year smoking history. She does not have any smokeless tobacco history on file.  Review of Systems  Constitutional: Negative.   HENT: Negative.   Eyes: Negative.   Respiratory: Negative.   Cardiovascular: Negative for chest pain, palpitations, orthopnea, claudication, leg swelling and PND.  Gastrointestinal: Negative for abdominal pain, blood in stool, constipation,  diarrhea, heartburn, melena, nausea and vomiting.  Genitourinary: Negative.   Musculoskeletal: Negative for back pain, falls (x 1), joint pain, myalgias and neck pain.  Skin: Negative.   Neurological: Negative for dizziness, tingling, tremors, sensory change (bilateral legs), speech change, focal weakness, seizures and loss of consciousness.  Endo/Heme/Allergies: Negative.   Psychiatric/Behavioral: Negative.     Physical Exam: Estimated body mass index is 38.53 kg/m as calculated from the following:   Height as of this encounter: 5\' 7"  (1.702 m).   Weight as of this encounter: 246 lb (111.6 kg). BP (!) 142/78   Pulse 86   Temp 98 F (36.7 C) (Temporal)   Resp 16   Ht 5\' 7"  (1.702 m)   Wt 246 lb (111.6 kg)   BMI 38.53 kg/m  General Appearance: Well nourished, in no apparent distress. Eyes: PERRLA, EOMs, conjunctiva no swelling or erythema, normal fundi and vessels. Sinuses: No Frontal/maxillary tenderness ENT/Mouth: Ext aud canals clear, normal light reflex with TMs without erythema, bulging.  Good dentition. No erythema, swelling, or exudate on post pharynx. Tonsils not swollen or erythematous. Hearing normal.  Neck: Supple, thyroid normal. No bruits Respiratory: Respiratory effort normal, BS equal bilaterally without rales, rhonchi, wheezing or stridor. Cardio: RRR without murmurs, rubs or gallops. Brisk peripheral pulses with 2+ edema.  Chest: symmetric, with normal excursions and percussion. Breasts: Symmetric, without lumps, nipple discharge, retractions. Abdomen: Soft, +BS. Non tender, no guarding, rebound, hernias, masses, or organomegaly. .  Lymphatics: Non tender without lymphadenopathy.  Genitourinary: No pap done, on right labia, has finger like projection, + cyctocele with possible hard nodule/mass Musculoskeletal: Full ROM all peripheral extremities,5/5 strength, and normal gait. Skin: Last forearm with smaller 61mm mobile, tender bump she would like removed. Left mid  back with 2-3 mm mole, dark with irreg edges. Several seb keratosis. Warm, dry without rashes, lesions, ecchymosis.  Neuro: Cranial nerves intact, reflexes equal bilaterally. Normal muscle tone, no cerebellar symptoms. Sensation decreased bilateral feet to mallelous Psych: Awake and oriented X 3, normal affect, Insight and Judgment appropriate.   EKG: WNL, PRWP, no ST changes. AORTA SCAN: defer   Vicie Mutters 3:23 PM

## 2016-07-26 NOTE — Patient Instructions (Addendum)
Call your insurance and ask about shingles vaccine and cost  We want weight loss that will last so you should lose 1-2 pounds a week.  THAT IS IT! Please pick THREE things a month to change. Once it is a habit check off the item. Then pick another three items off the list to become habits.  If you are already doing a habit on the list GREAT!  Cross that item off! o Don't drink your calories. Ie, alcohol, soda, fruit juice, and sweet tea.  o Drink more water. Drink a glass when you feel hungry or before each meal.  o Eat breakfast - Complex carb and protein (likeDannon light and fit yogurt, oatmeal, fruit, eggs, [place] bacon). o Measure your cereal.  Eat no more than one cup a day. (ie [place]) o Eat an apple a day. o Add a vegetable a day. o Try a new vegetable a month. o Use Pam! Stop using oil or butter to cook. o Don't finish your plate or use smaller plates. o Share your dessert. o Eat sugar free Jello for dessert or frozen grapes. o Don't eat 2-3 hours before bed. o Switch to whole wheat bread, pasta, and brown rice. o Make healthier choices when you eat out. No fries! o Pick baked chicken, NOT fried. o Don't forget to SLOW DOWN when you eat. It is not going anywhere.  o Take the stairs. o Park far away in the parking lot o [employer] (or weights) for 10 minutes while watching TV. o Walk at work for 10 minutes during break. o Walk outside 1 time a week with your friend, kids, dog, or significant other. o Start a walking group at [place] the mall as much as you can tolerate.  o Keep a food diary. o Weigh yourself daily. o Walk for 15 minutes 3 days per week. o Cook at home more often and eat out less.  If life happens and you go back to old habits, it is okay.  Just start over. You can do it!   If you experience chest pain, get short of breath, or tired during the exercise, please stop immediately and inform your doctor.

## 2016-07-27 LAB — HEMOGLOBIN A1C
HEMOGLOBIN A1C: 6.2 % — AB (ref <5.7)
Mean Plasma Glucose: 131 mg/dL

## 2016-07-27 LAB — URINALYSIS, ROUTINE W REFLEX MICROSCOPIC
BILIRUBIN URINE: NEGATIVE
Glucose, UA: NEGATIVE
HGB URINE DIPSTICK: NEGATIVE
KETONES UR: NEGATIVE
Leukocytes, UA: NEGATIVE
NITRITE: NEGATIVE
PROTEIN: NEGATIVE
Specific Gravity, Urine: 1.011 (ref 1.001–1.035)
pH: 6.5 (ref 5.0–8.0)

## 2016-07-27 LAB — MICROALBUMIN / CREATININE URINE RATIO
CREATININE, URINE: 81 mg/dL (ref 20–320)
Microalb, Ur: <0.2 mg/dL

## 2016-07-27 LAB — HEPATITIS C ANTIBODY: HCV Ab: NEGATIVE

## 2016-07-27 LAB — VITAMIN B12: VITAMIN B 12: 1051 pg/mL (ref 200–1100)

## 2016-07-27 LAB — TSH: TSH: 2.03 mIU/L

## 2016-07-27 LAB — VITAMIN D 25 HYDROXY (VIT D DEFICIENCY, FRACTURES): Vit D, 25-Hydroxy: 46 ng/mL (ref 30–100)

## 2016-08-04 ENCOUNTER — Other Ambulatory Visit: Payer: Self-pay | Admitting: Internal Medicine

## 2016-08-18 ENCOUNTER — Other Ambulatory Visit: Payer: Self-pay | Admitting: Internal Medicine

## 2016-08-23 ENCOUNTER — Ambulatory Visit: Payer: Self-pay | Admitting: Women's Health

## 2016-08-24 ENCOUNTER — Other Ambulatory Visit: Payer: Self-pay | Admitting: Physician Assistant

## 2016-08-25 ENCOUNTER — Other Ambulatory Visit: Payer: Self-pay | Admitting: Obstetrics and Gynecology

## 2016-08-25 ENCOUNTER — Other Ambulatory Visit: Payer: Self-pay | Admitting: Physician Assistant

## 2016-08-25 DIAGNOSIS — R928 Other abnormal and inconclusive findings on diagnostic imaging of breast: Secondary | ICD-10-CM

## 2016-08-29 ENCOUNTER — Other Ambulatory Visit: Payer: Self-pay | Admitting: Physician Assistant

## 2016-09-02 ENCOUNTER — Ambulatory Visit
Admission: RE | Admit: 2016-09-02 | Discharge: 2016-09-02 | Disposition: A | Discharge status: Home / Self Care | Payer: BLUE CROSS/BLUE SHIELD | Source: Ambulatory Visit | Attending: Obstetrics and Gynecology | Admitting: Obstetrics and Gynecology

## 2016-09-02 DIAGNOSIS — R928 Other abnormal and inconclusive findings on diagnostic imaging of breast: Secondary | ICD-10-CM

## 2016-09-14 ENCOUNTER — Other Ambulatory Visit: Payer: Self-pay

## 2016-09-14 ENCOUNTER — Other Ambulatory Visit: Payer: Self-pay | Admitting: *Deleted

## 2016-09-14 MED ORDER — ESOMEPRAZOLE MAGNESIUM 40 MG PO CPDR: 40.0000 mg | DELAYED_RELEASE_CAPSULE | Freq: Every day | ORAL | 1 refills | Status: DC

## 2016-09-14 MED ORDER — MELOXICAM 7.5 MG PO TABS: ORAL_TABLET | ORAL | 1 refills | Status: DC

## 2016-09-16 ENCOUNTER — Other Ambulatory Visit: Payer: Self-pay

## 2016-09-16 MED ORDER — FUROSEMIDE 20 MG PO TABS: 20.0000 mg | ORAL_TABLET | Freq: Two times a day (BID) | ORAL | 1 refills | Status: DC

## 2016-09-19 ENCOUNTER — Other Ambulatory Visit: Payer: Self-pay | Admitting: *Deleted

## 2016-09-19 MED ORDER — FUROSEMIDE 20 MG PO TABS: 20.0000 mg | ORAL_TABLET | Freq: Two times a day (BID) | ORAL | 1 refills | Status: DC

## 2016-09-27 ENCOUNTER — Other Ambulatory Visit: Payer: Self-pay | Admitting: Internal Medicine

## 2016-09-28 ENCOUNTER — Other Ambulatory Visit: Payer: Self-pay | Admitting: Physician Assistant

## 2016-10-06 ENCOUNTER — Other Ambulatory Visit: Payer: Self-pay | Admitting: Internal Medicine

## 2016-10-10 ENCOUNTER — Other Ambulatory Visit: Payer: Self-pay | Admitting: Physician Assistant

## 2016-10-10 MED ORDER — PREGABALIN 75 MG PO CAPS: 75.0000 mg | ORAL_CAPSULE | Freq: Three times a day (TID) | ORAL | 3 refills | Status: DC

## 2016-10-14 ENCOUNTER — Other Ambulatory Visit: Payer: Self-pay | Admitting: Physician Assistant

## 2016-10-26 ENCOUNTER — Ambulatory Visit (INDEPENDENT_AMBULATORY_CARE_PROVIDER_SITE_OTHER): Payer: BLUE CROSS/BLUE SHIELD | Admitting: Physician Assistant

## 2016-10-26 ENCOUNTER — Encounter: Payer: Self-pay | Admitting: Physician Assistant

## 2016-10-26 VITALS — BP 140/90 | HR 86 | Temp 97.3°F | Resp 16 | Ht 67.0 in | Wt 245.0 lb

## 2016-10-26 DIAGNOSIS — E559 Vitamin D deficiency, unspecified: Secondary | ICD-10-CM

## 2016-10-26 DIAGNOSIS — E118 Type 2 diabetes mellitus with unspecified complications: Secondary | ICD-10-CM

## 2016-10-26 DIAGNOSIS — Z79899 Other long term (current) drug therapy: Secondary | ICD-10-CM

## 2016-10-26 DIAGNOSIS — I10 Essential (primary) hypertension: Secondary | ICD-10-CM

## 2016-10-26 DIAGNOSIS — E785 Hyperlipidemia, unspecified: Secondary | ICD-10-CM | POA: Diagnosis not present

## 2016-10-26 DIAGNOSIS — G9009 Other idiopathic peripheral autonomic neuropathy: Secondary | ICD-10-CM | POA: Diagnosis not present

## 2016-10-26 DIAGNOSIS — N182 Chronic kidney disease, stage 2 (mild): Secondary | ICD-10-CM

## 2016-10-26 LAB — CBC WITH DIFFERENTIAL/PLATELET
Basophils Absolute: 0 cells/uL (ref 0–200)
Basophils Relative: 0 %
EOS PCT: 4 %
Eosinophils Absolute: 452 cells/uL (ref 15–500)
HEMATOCRIT: 39 % (ref 35.0–45.0)
HEMOGLOBIN: 12.8 g/dL (ref 11.7–15.5)
LYMPHS ABS: 3616 {cells}/uL (ref 850–3900)
Lymphocytes Relative: 32 %
MCH: 27.2 pg (ref 27.0–33.0)
MCHC: 32.8 g/dL (ref 32.0–36.0)
MCV: 83 fL (ref 80.0–100.0)
MPV: 10.1 fL (ref 7.5–12.5)
Monocytes Absolute: 904 cells/uL (ref 200–950)
Monocytes Relative: 8 %
NEUTROS PCT: 56 %
Neutro Abs: 6328 cells/uL (ref 1500–7800)
Platelets: 281 10*3/uL (ref 140–400)
RBC: 4.7 MIL/uL (ref 3.80–5.10)
RDW: 15.2 % — AB (ref 11.0–15.0)
WBC: 11.3 10*3/uL — ABNORMAL HIGH (ref 3.8–10.8)

## 2016-10-26 LAB — TSH: TSH: 0.82 m[IU]/L

## 2016-10-26 MED ORDER — TOPIRAMATE 50 MG PO TABS: ORAL_TABLET | ORAL | 2 refills | Status: DC

## 2016-10-26 NOTE — Patient Instructions (Addendum)
Just remember, you are jellying  Stop the amitriptyline Continue the nortriptyline twice a day x 7 days Add on topamax 25mg  at night x 7 days  Stop nortriptyline at night and increase topamax to 50mg  at night Can stop nortriptyline if you want and stay on topamax 50mg  can go up to 100mg   Monitor your blood pressure at home. Go to the ER if any CP, SOB, nausea, dizziness, severe HA, changes vision/speech  Goal BP:  For patients younger than 60: Goal BP < 140/90. For patients 60 and older: Goal BP < 150/90. For patients with diabetes: Goal BP < 140/90. Your most recent BP: BP: 140/90   Take your medications faithfully as instructed. Maintain a healthy weight. Get at least 150 minutes of aerobic exercise per week. Minimize salt intake. Minimize alcohol intake  DASH Eating Plan DASH stands for "Dietary Approaches to Stop Hypertension." The DASH eating plan is a healthy eating plan that has been shown to reduce high blood pressure (hypertension). Additional health benefits may include reducing the risk of type 2 diabetes mellitus, heart disease, and stroke. The DASH eating plan may also help with weight loss. WHAT DO I NEED TO KNOW ABOUT THE DASH EATING PLAN? For the DASH eating plan, you will follow these general guidelines:  Choose foods with a percent daily value for sodium of less than 5% (as listed on the food label).  Use salt-free seasonings or herbs instead of table salt or sea salt.  Check with your health care provider or pharmacist before using salt substitutes.  Eat lower-sodium products, often labeled as "lower sodium" or "no salt added."  Eat fresh foods.  Eat more vegetables, fruits, and low-fat dairy products.  Choose whole grains. Look for the word "whole" as the first word in the ingredient list.  Choose fish and skinless chicken or [place] more often than red meat. Limit fish, poultry, and meat to 6 oz (170 g) each day.  Limit sweets, desserts, sugars, and  sugary drinks.  Choose heart-healthy fats.  Limit cheese to 1 oz (28 g) per day.  Eat more home-cooked food and less restaurant, buffet, and fast food.  Limit fried foods.  Cook foods using methods other than frying.  Limit canned vegetables. If you do use them, rinse them well to decrease the sodium.  When eating at a restaurant, ask that your food be prepared with less salt, or no salt if possible. WHAT FOODS CAN I EAT? Seek help from a dietitian for individual calorie needs. Grains Whole grain or whole wheat bread. Brown rice. Whole grain or whole wheat pasta. Quinoa, bulgur, and whole grain cereals. Low-sodium cereals. Corn or whole wheat flour tortillas. Whole grain cornbread. Whole grain crackers. Low-sodium crackers. Vegetables Fresh or frozen vegetables (raw, steamed, roasted, or grilled). Low-sodium or reduced-sodium tomato and vegetable juices. Low-sodium or reduced-sodium tomato sauce and paste. Low-sodium or reduced-sodium canned vegetables.  Fruits All fresh, canned (in natural juice), or frozen fruits. Meat and Other Protein Products Ground beef (85% or leaner), grass-fed beef, or beef trimmed of fat. Skinless chicken or [place]. Ground chicken or [place]. Pork trimmed of fat. All fish and seafood. Eggs. Dried beans, peas, or lentils. Unsalted nuts and seeds. Unsalted canned beans. Dairy Low-fat dairy products, such as skim or 1% milk, 2% or reduced-fat cheeses, low-fat ricotta or cottage cheese, or plain low-fat yogurt. Low-sodium or reduced-sodium cheeses. Fats and Oils Tub margarines without trans fats. Light or reduced-fat mayonnaise and salad dressings (reduced sodium). Avocado. Safflower,  olive, or canola oils. Natural peanut or almond butter. Other Unsalted popcorn and pretzels. The items listed above may not be a complete list of recommended foods or beverages. Contact your dietitian for more options. WHAT FOODS ARE NOT RECOMMENDED? Grains White bread. White  pasta. White rice. Refined cornbread. Bagels and croissants. Crackers that contain trans fat. Vegetables Creamed or fried vegetables. Vegetables in a cheese sauce. Regular canned vegetables. Regular canned tomato sauce and paste. Regular tomato and vegetable juices. Fruits Dried fruits. Canned fruit in light or heavy syrup. Fruit juice. Meat and Other Protein Products Fatty cuts of meat. Ribs, chicken wings, bacon, sausage, bologna, salami, chitterlings, fatback, hot dogs, bratwurst, and packaged luncheon meats. Salted nuts and seeds. Canned beans with salt. Dairy Whole or 2% milk, cream, half-and-half, and cream cheese. Whole-fat or sweetened yogurt. Full-fat cheeses or blue cheese. Nondairy creamers and whipped toppings. Processed cheese, cheese spreads, or cheese curds. Condiments Onion and garlic salt, seasoned salt, table salt, and sea salt. Canned and packaged gravies. Worcestershire sauce. Tartar sauce. Barbecue sauce. Teriyaki sauce. Soy sauce, including reduced sodium. Steak sauce. Fish sauce. Oyster sauce. Cocktail sauce. Horseradish. Ketchup and mustard. Meat flavorings and tenderizers. Bouillon cubes. Hot sauce. Tabasco sauce. Marinades. Taco seasonings. Relishes. Fats and Oils Butter, stick margarine, lard, shortening, ghee, and bacon fat. Coconut, palm kernel, or palm oils. Regular salad dressings. Other Pickles and olives. Salted popcorn and pretzels. The items listed above may not be a complete list of foods and beverages to avoid. Contact your dietitian for more information. WHERE CAN I FIND MORE INFORMATION? National Heart, Lung, and Blood Institute: travelstabloid.com Document Released: 10/27/2011 Document Revised: 03/24/2014 Document Reviewed: 09/11/2013 [facility] Patient Information 2015 [place], [place]. This information is not intended to replace advice given to you by your health care provider. Make sure you discuss any questions you have with  your health care provider.  We want weight loss that will last so you should lose 1-2 pounds a week.  THAT IS IT! Please pick THREE things a month to change. Once it is a habit check off the item. Then pick another three items off the list to become habits.  If you are already doing a habit on the list GREAT!  Cross that item off! o Don't drink your calories. Ie, alcohol, soda, fruit juice, and sweet tea.  o Drink more water. Drink a glass when you feel hungry or before each meal.  o Eat breakfast - Complex carb and protein (likeDannon light and fit yogurt, oatmeal, fruit, eggs, [place] bacon). o Measure your cereal.  Eat no more than one cup a day. (ie [place]) o Eat an apple a day. o Add a vegetable a day. o Try a new vegetable a month. o Use Pam! Stop using oil or butter to cook. o Don't finish your plate or use smaller plates. o Share your dessert. o Eat sugar free Jello for dessert or frozen grapes. o Don't eat 2-3 hours before bed. o Switch to whole wheat bread, pasta, and brown rice. o Make healthier choices when you eat out. No fries! o Pick baked chicken, NOT fried. o Don't forget to SLOW DOWN when you eat. It is not going anywhere.  o Take the stairs. o Park far away in the parking lot o [employer] (or weights) for 10 minutes while watching TV. o Walk at work for 10 minutes during break. o Walk outside 1 time a week with your friend, kids, dog, or significant other. o Start  a walking group at [place] as much as you can tolerate.  o Keep a food diary. o Weigh yourself daily. o Walk for 15 minutes 3 days per week. o Cook at home more often and eat out less.  If life happens and you go back to old habits, it is okay.  Just start over. You can do it!   If you experience chest pain, get short of breath, or tired during the exercise, please stop immediately and inform your doctor.

## 2016-10-26 NOTE — Progress Notes (Signed)
Assessment and Plan:   Hyperlipidemia, unspecified hyperlipidemia type -continue medications, check lipids, decrease fatty foods, increase activity.  -     Lipid panel  Essential hypertension - continue medications, DASH diet, exercise and monitor at home. Call if greater than 130/80.  -     CBC with Differential/Platelet -     BASIC METABOLIC PANEL WITH GFR -     Hepatic function panel -     TSH  Controlled type 2 diabetes mellitus with complication, without long-term current use of insulin (HCC) Discussed general issues about diabetes pathophysiology and management., Educational material distributed., Suggested low cholesterol diet., Encouraged aerobic exercise., Discussed foot care., Reminded to get yearly retinal exam. -     Hemoglobin A1c  CKD (chronic kidney disease) stage 2, GFR 60-89 ml/min Increase fluids, avoid NSAIDS, monitor sugars, will monitor -     BASIC METABOLIC PANEL WITH GFR  Peripheral autonomic neuropathy of unknown cause Stop elavil and nortriptyline and switch to topamax, can continue lyrica -     topiramate (TOPAMAX) 50 MG tablet; 1/2-1 tablet at night  Vitamin D deficiency  Medication management -     Magnesium  Morbid Obesity with co morbidities - long discussion about weight loss, diet, and exercise  Continue diet and meds as discussed. Further disposition pending results of labs. Over 30 minutes of exam, counseling, chart review, and critical decision making was performed  Future Appointments Date Time Provider [place]  08/10/2017 2:00 PM Vicie Mutters, PA-C [id number] None     HPI 70 y.o. female  presents for 3 month follow up on hypertension, cholesterol, prediabetes, and vitamin D deficiency.   Her blood pressure has been controlled at home, has been good, today their BP is BP: 140/90  She does workout. She denies chest pain, shortness of breath, dizziness.  She is on cholesterol medication and denies myalgias. Her cholesterol is  at goal. The cholesterol last visit was:   Lab Results  Component Value Date   CHOL 142 07/26/2016   HDL 48 07/26/2016   LDLCALC 68 07/26/2016   TRIG 130 07/26/2016   CHOLHDL 3.0 07/26/2016   She has been working on diet and exercise for Diabetes with diabetic chronic kidney disease, she is on bASA, she is on ACE/ARB, he is off estrogen now, she has idiopathic paraesthsias and is on lyrica/elavil and denies paresthesia of the feet, polydipsia, polyuria and visual disturbances. Last A1C was:  Lab Results  Component Value Date   HGBA1C 6.2 (H) 07/26/2016   Patient is on Vitamin D supplement.   Lab Results  Component Value Date   VD25OH 46 07/26/2016     BMI is Body mass index is 38.37 kg/m., she is working on diet and exercise. Wt Readings from Last 3 Encounters:  10/26/16 245 lb (111.1 kg)  07/26/16 246 lb (111.6 kg)  05/19/16 244 lb 12.8 oz (111 kg)   She is on thyroid medication. Her medication was not changed last visit.   Lab Results  Component Value Date   TSH 2.03 07/26/2016  .   Current Medications:  Current Outpatient Prescriptions on File Prior to Visit  Medication Sig Dispense Refill  . amitriptyline (ELAVIL) 25 MG tablet TAKE 1 TABLET TWICE A DAY 180 tablet 0  . aspirin 81 MG tablet Take 81 mg by mouth daily.    Marland Kitchen atorvastatin (LIPITOR) 40 MG tablet TAKE 1 TABLET AT BEDTIME   FOR CHOLESTEROL 90 tablet 0  . AZO-CRANBERRY PO Take 2 tablets by  mouth daily.    Marland Kitchen b complex vitamins tablet Take 1 tablet by mouth daily.    Marland Kitchen CALCIUM PO Take 1,200 mg by mouth daily.     . cyclobenzaprine (FLEXERIL) 10 MG tablet TAKE 1/2 TO 1 TABLET 3     TIMES A DAY AS NEEDED FOR  MUSCLE SPASM 90 tablet 0  . diltiazem (DILACOR XR) 240 MG 24 hr capsule TAKE 1 CAPSULE DAILY 90 capsule 1  . esomeprazole (NEXIUM) 40 MG capsule Take 1 capsule (40 mg total) by mouth daily. 90 capsule 1  . furosemide (LASIX) 20 MG tablet Take 1 tablet (20 mg total) by mouth 2 (two) times daily. 180 tablet 1  .  MEGARED OMEGA-3 KRILL OIL 500 MG CAPS Take 1 capsule by mouth daily.    . meloxicam (MOBIC) 7.5 MG tablet TAKE 1 TABLET DAILY WITH   FOOD FOR PAIN 90 tablet 1  . Multiple Vitamin (MULTIVITAMIN) tablet Take 1 tablet by mouth daily.    . nortriptyline (PAMELOR) 25 MG capsule Take 25 mg by mouth 2 (two) times daily.     . Omega-3 Fatty Acids (OMEGA 3 PO) Take 1,000 mg by mouth daily.    . pregabalin (LYRICA) 75 MG capsule Take 1 capsule (75 mg total) by mouth 3 (three) times daily. 270 capsule 3  . SYNTHROID 125 MCG tablet TAKE 1 TABLET DAILY FOR    THYROID *ABBVIE** 90 tablet 0   No current facility-administered medications on file prior to visit.    Medical History:  Past Medical History:  Diagnosis Date  . Anemia   . Chest tightness   . Fatigue   . GERD (gastroesophageal reflux disease)   . Hyperlipidemia   . Hypothyroid   . Myalgia   . Obesity   . Prediabetes   . Ulcer ([place])   . Varicose veins    Allergies:  Allergies  Allergen Reactions  . Levaquin [Levofloxacin In D5w]   . Macrobid [Nitrofurantoin Macrocrystal]   . Penicillins     REACTION: rash  . Procaine Hcl     REACTION: difficulty breathing  . Sulfonamide Derivatives     REACTION: hives  . Tetanus Toxoid     REACTION: arm swells     Review of Systems:  ROS  Family history- Review and unchanged Social history- Review and unchanged Physical Exam: BP 140/90   Pulse 86   Temp 97.3 F (36.3 C)   Resp 16   Ht 5\' 7"  (1.702 m)   Wt 245 lb (111.1 kg)   SpO2 98%   BMI 38.37 kg/m  Wt Readings from Last 3 Encounters:  10/26/16 245 lb (111.1 kg)  07/26/16 246 lb (111.6 kg)  05/19/16 244 lb 12.8 oz (111 kg)   General Appearance: Well nourished, in no apparent distress. Eyes: PERRLA, EOMs, conjunctiva no swelling or erythema Sinuses: No Frontal/maxillary tenderness ENT/Mouth: Ext aud canals clear, TMs without erythema, bulging. No erythema, swelling, or exudate on post pharynx.  Tonsils not swollen or  erythematous. Hearing normal.  Neck: Supple, thyroid normal.  Respiratory: Respiratory effort normal, BS equal bilaterally without rales, rhonchi, wheezing or stridor.  Cardio: RRR with no MRGs. Brisk peripheral pulses without edema.  Abdomen: Soft, + BS,  Non tender, no guarding, rebound, hernias, masses. Lymphatics: Non tender without lymphadenopathy.  Musculoskeletal: Full ROM, 5/5 strength, Normal gait Skin: Warm, dry without rashes, lesions, ecchymosis.  Neuro: Cranial nerves intact. Normal muscle tone, no cerebellar symptoms. Psych: Awake and oriented X 3, normal affect,  Insight and Judgment appropriate.    Vicie Mutters, PA-C 4:39 PM [facility]

## 2016-10-27 LAB — HEPATIC FUNCTION PANEL
ALT: 37 U/L — AB (ref 6–29)
AST: 27 U/L (ref 10–35)
Albumin: 3.9 g/dL (ref 3.6–5.1)
Alkaline Phosphatase: 89 U/L (ref 33–130)
Bilirubin, Direct: 0.1 mg/dL (ref <=0.2)
Indirect Bilirubin: 0.3 mg/dL (ref 0.2–1.2)
TOTAL PROTEIN: 6.5 g/dL (ref 6.1–8.1)
Total Bilirubin: 0.4 mg/dL (ref 0.2–1.2)

## 2016-10-27 LAB — MAGNESIUM: MAGNESIUM: 2 mg/dL (ref 1.5–2.5)

## 2016-10-27 LAB — LIPID PANEL
Cholesterol: 129 mg/dL (ref <200)
HDL: 33 mg/dL — ABNORMAL LOW (ref >50)
LDL CALC: 53 mg/dL (ref <100)
TRIGLYCERIDES: 216 mg/dL — AB (ref <150)
Total CHOL/HDL Ratio: 3.9 Ratio (ref <5.0)
VLDL: 43 mg/dL — AB (ref <30)

## 2016-10-27 LAB — BASIC METABOLIC PANEL WITH GFR
BUN: 16 mg/dL (ref 7–25)
CALCIUM: 8.8 mg/dL (ref 8.6–10.4)
CO2: 24 mmol/L (ref 20–31)
CREATININE: 1.09 mg/dL — AB (ref 0.50–0.99)
Chloride: 106 mmol/L (ref 98–110)
GFR, Est African American: 60 mL/min (ref >=60)
GFR, Est Non African American: 52 mL/min — ABNORMAL LOW (ref >=60)
GLUCOSE: 112 mg/dL — AB (ref 65–99)
Potassium: 3.7 mmol/L (ref 3.5–5.3)
SODIUM: 140 mmol/L (ref 135–146)

## 2016-10-27 LAB — HEMOGLOBIN A1C
Hgb A1c MFr Bld: 6.3 % — ABNORMAL HIGH (ref <5.7)
Mean Plasma Glucose: 134 mg/dL

## 2016-10-28 ENCOUNTER — Other Ambulatory Visit: Payer: Self-pay | Admitting: Physician Assistant

## 2016-11-07 ENCOUNTER — Ambulatory Visit (INDEPENDENT_AMBULATORY_CARE_PROVIDER_SITE_OTHER): Payer: BLUE CROSS/BLUE SHIELD | Admitting: Physician Assistant

## 2016-11-07 ENCOUNTER — Encounter: Payer: Self-pay | Admitting: Physician Assistant

## 2016-11-07 ENCOUNTER — Other Ambulatory Visit: Payer: Self-pay

## 2016-11-07 VITALS — BP 140/82 | HR 105 | Temp 98.1°F | Resp 16 | Ht 67.0 in | Wt 236.0 lb

## 2016-11-07 DIAGNOSIS — G9009 Other idiopathic peripheral autonomic neuropathy: Secondary | ICD-10-CM

## 2016-11-07 DIAGNOSIS — J101 Influenza due to other identified influenza virus with other respiratory manifestations: Secondary | ICD-10-CM | POA: Diagnosis not present

## 2016-11-07 LAB — POC INFLUENZA A&B (BINAX/QUICKVUE)
INFLUENZA A, POC: NEGATIVE
Influenza B, POC: POSITIVE — AB

## 2016-11-07 MED ORDER — FLUCONAZOLE 150 MG PO TABS: 150.0000 mg | ORAL_TABLET | Freq: Every day | ORAL | 3 refills | Status: DC

## 2016-11-07 MED ORDER — AZITHROMYCIN 250 MG PO TABS: ORAL_TABLET | ORAL | 1 refills | Status: AC

## 2016-11-07 MED ORDER — BENZONATATE 100 MG PO CAPS: 200.0000 mg | ORAL_CAPSULE | Freq: Three times a day (TID) | ORAL | 0 refills | Status: DC | PRN

## 2016-11-07 MED ORDER — PREDNISONE 20 MG PO TABS: ORAL_TABLET | ORAL | 0 refills | Status: DC

## 2016-11-07 MED ORDER — TOPIRAMATE 50 MG PO TABS: ORAL_TABLET | ORAL | 2 refills | Status: DC

## 2016-11-07 MED ORDER — PROMETHAZINE-CODEINE 6.25-10 MG/5ML PO SYRP: 5.0000 mL | ORAL_SOLUTION | Freq: Four times a day (QID) | ORAL | 0 refills | Status: DC | PRN

## 2016-11-07 NOTE — Patient Instructions (Signed)
Take the zpak Take the prednisone as directed below Get on allergy pill see below Get on flonase see below Rest, fluids If any chest pain, shortness of breath go to the ER  Make sure you are on an allergy pill, see below for more details. Please take the prednisone as directed below, this is NOT an antibiotic so you do NOT have to finish it. You can take it for a few days and stop it if you are doing better.   Please take the prednisone to help decrease inflammation and therefore decrease symptoms. Take it it with food to avoid GI upset. It can cause increased energy but on the other hand it can make it hard to sleep at night so please take it AT [place], it takes 8-12 hours to start working so it will NOT affect your sleeping if you take it at night with your food!!  If you are diabetic it will increase your sugars so decrease carbs and monitor your sugars closely.    Can do a steroid nasal spary 1-2 sparys at night each nostril. Remember to spray each nostril twice towards the outer part of your eye.  Do not sniff but instead pinch your nose and tilt your head back to help the medicine get into your sinuses.  The best time to do this is at bedtime. Stop if you get blurred vision or nose bleeds.   Please pick one of the over the counter allergy medications below and take it once daily for allergies.  Claritin or loratadine cheapest but likely the weakest  Zyrtec or certizine at night because it can make you sleepy The strongest is allegra or fexafinadine  Cheapest at [facility], [employer], [employer]    HOW TO TREAT VIRAL COUGH AND COLD SYMPTOMS:  -Symptoms usually last at least 1 week with the worst symptoms being around day 4.  - colds usually start with a sore throat and end with a cough, and the cough can take 2 weeks to get better.  -No antibiotics are needed for colds, flu, sore throats, cough, bronchitis UNLESS symptoms are longer than 7 days OR if you are getting better then get  drastically worse.  -There are a lot of combination medications (Dayquil, Nyquil, Vicks 44, tyelnol cold and sinus, ETC). Please look at the ingredients on the back so that you are treating the correct symptoms and not doubling up on medications/ingredients.    Medicines you can use  Nasal congestion  - pseudoephedrine (Sudafed)- behind the counter, do not use if you have high blood pressure, medicine that have -D in them.  - phenylephrine (Sudafed PE) -Dextormethorphan + chlorpheniramine (Coridcidin HBP)- okay if you have high blood pressure -Oxymetazoline (Afrin) nasal spray- LIMIT to 3 days -Saline nasal spray -Neti pot (used distilled or bottled water)  Ear pain/congestion  -pseudoephedrine (sudafed) - Nasonex/flonase nasal spray  Fever  -Acetaminophen (Tyelnol) -Ibuprofen (Advil, motrin, aleve)  Sore Throat  -Acetaminophen (Tyelnol) -Ibuprofen (Advil, motrin, aleve) -Drink a lot of water -Gargle with salt water - Rest your voice (don't talk) -Throat sprays -Cough drops  Body Aches  -Acetaminophen (Tyelnol) -Ibuprofen (Advil, motrin, aleve)  Headache  -Acetaminophen (Tyelnol) -Ibuprofen (Advil, motrin, aleve) - Exedrin, Exedrin Migraine  Allergy symptoms (cough, sneeze, runny nose, itchy eyes) -Claritin or loratadine cheapest but likely the weakest  -Zyrtec or certizine at night because it can make you sleepy -The strongest is allegra or fexafinadine  Cheapest at [facility], [employer], [employer]  Cough  -Dextromethorphan (Delsym)-  medicine that has DM in it -Guafenesin (Mucinex/Robitussin) - cough drops - drink lots of water  Chest Congestion  -Guafenesin (Mucinex/Robitussin)  Red Itchy Eyes  - Naphcon-A  Upset Stomach  - Bland diet (nothing spicy, greasy, fried, and high acid foods like tomatoes, oranges, berries) -OKAY- cereal, bread, soup, crackers, rice -Eat smaller more frequent meals -reduce caffeine, no alcohol -Loperamide (Imodium-AD) if  diarrhea -Prevacid for heart burn  General health when sick  -Hydration -wash your hands frequently -keep surfaces clean -change pillow cases and sheets often -Get fresh air but do not exercise strenuously -Vitamin D, double up on it - Vitamin C -Zinc

## 2016-11-07 NOTE — Progress Notes (Signed)
Subjective:    Patient ID: [name], female    DOB: 1946-06-26, 70 y.o.   MRN: [id number]  HPI 70 y.o. WF with history of DM2, low thyroid, CKD, chol presents with sinus infection/flu x 5 days, getting progressively worse with body aches, fever, sore throat, sinus congestion, cough.   Blood pressure 140/82, pulse (!) 105, temperature 98.1 F (36.7 C), resp. rate 16, height 5\' 7"  (1.702 m), weight 236 lb (107 kg), SpO2 99 %.  Medications Current Outpatient Prescriptions on File Prior to Visit  Medication Sig  . aspirin 81 MG tablet Take 81 mg by mouth daily.  Marland Kitchen atorvastatin (LIPITOR) 40 MG tablet TAKE 1 TABLET AT BEDTIME   FOR CHOLESTEROL  . AZO-CRANBERRY PO Take 2 tablets by mouth daily.  Marland Kitchen b complex vitamins tablet Take 1 tablet by mouth daily.  Marland Kitchen CALCIUM PO Take 1,200 mg by mouth daily.   . cyclobenzaprine (FLEXERIL) 10 MG tablet TAKE 1/2 TO 1 TABLET 3     TIMES A DAY AS NEEDED FOR  MUSCLE SPASM  . diltiazem (DILACOR XR) 240 MG 24 hr capsule TAKE 1 CAPSULE DAILY  . esomeprazole (NEXIUM) 40 MG capsule Take 1 capsule (40 mg total) by mouth daily.  . furosemide (LASIX) 20 MG tablet Take 1 tablet (20 mg total) by mouth 2 (two) times daily.  Marland Kitchen MEGARED OMEGA-3 KRILL OIL 500 MG CAPS Take 1 capsule by mouth daily.  . meloxicam (MOBIC) 7.5 MG tablet TAKE 1 TABLET DAILY WITH   FOOD FOR PAIN  . Multiple Vitamin (MULTIVITAMIN) tablet Take 1 tablet by mouth daily.  . nortriptyline (PAMELOR) 25 MG capsule Take 25 mg by mouth 2 (two) times daily.   . Omega-3 Fatty Acids (OMEGA 3 PO) Take 1,000 mg by mouth daily.  . pregabalin (LYRICA) 75 MG capsule Take 1 capsule (75 mg total) by mouth 3 (three) times daily.  Marland Kitchen SYNTHROID 125 MCG tablet TAKE 1 TABLET DAILY FOR    THYROID *ABBVIE**   No current facility-administered medications on file prior to visit.     Problem list She has Internal hemorrhoids; GERD; Diverticulosis of large intestine; History of tobacco use; Hyperlipidemia; Hypothyroid;  Anemia; Diabetes mellitus type 2, controlled, with complications ([place]); Morbid obesity ([place]); Chronic venous insufficiency; CKD (chronic kidney disease) stage 2, GFR 60-89 ml/min; and Peripheral autonomic neuropathy of unknown cause on her problem list.   Review of Systems  Constitutional: Negative for chills and diaphoresis.  HENT: Positive for congestion, postnasal drip, sinus pressure and sneezing. Negative for ear pain and sore throat.   Respiratory: Positive for cough. Negative for choking, chest tightness, shortness of breath, wheezing and stridor.   Cardiovascular: Negative.   Gastrointestinal: Negative.   Genitourinary: Negative.   Musculoskeletal: Negative for neck pain.  Neurological: Positive for headaches.       Objective:   Physical Exam  Constitutional: She appears well-developed and well-nourished.  HENT:  Head: Normocephalic and atraumatic.  Right Ear: External ear normal. A middle ear effusion is present.  Left Ear: A middle ear effusion is present.  Nose: Right sinus exhibits maxillary sinus tenderness. Right sinus exhibits no frontal sinus tenderness. Left sinus exhibits maxillary sinus tenderness. Left sinus exhibits no frontal sinus tenderness.  Mouth/Throat: Uvula is midline. Posterior oropharyngeal erythema present. No oropharyngeal exudate, posterior oropharyngeal edema or tonsillar abscesses.  Eyes: Conjunctivae and EOM are normal.  Neck: Normal range of motion. Neck supple.  Cardiovascular: Normal rate, regular rhythm, normal heart sounds and intact distal  pulses.   Pulmonary/Chest: Effort normal and breath sounds normal. No respiratory distress. She has no wheezes.  Abdominal: Soft. Bowel sounds are normal.  Lymphadenopathy:    She has cervical adenopathy.  Skin: Skin is warm and dry.       Assessment & Plan:  1. Influenza B - POC Influenza A&B(BINAX/QUICKVUE)  + Flu B Will be out of work for the next week Go to the hospital if any SOB, chest pain -  azithromycin (ZITHROMAX) 250 MG tablet; Take 2 tablets (500 mg) on  Day 1,  followed by 1 tablet (250 mg) once daily on Days 2 through 5.  Dispense: 6 each; Refill: 1 - predniSONE (DELTASONE) 20 MG tablet; 2 tablets daily for 3 days, 1 tablet daily for 4 days.  Dispense: 10 tablet; Refill: 0 - promethazine-codeine (PHENERGAN WITH CODEINE) 6.25-10 MG/5ML syrup; Take 5 mLs by mouth every 6 (six) hours as needed for cough. Max: 45mL per day  Dispense: 240 mL; Refill: 0 - benzonatate (TESSALON PERLES) 100 MG capsule; Take 2 capsules (200 mg total) by mouth 3 (three) times daily as needed for cough (Max: 600mg  per day).  Dispense: 60 capsule; Refill: 0

## 2016-11-17 ENCOUNTER — Other Ambulatory Visit: Payer: Self-pay | Admitting: Physician Assistant

## 2016-11-27 ENCOUNTER — Other Ambulatory Visit: Payer: Self-pay | Admitting: Physician Assistant

## 2016-12-15 ENCOUNTER — Other Ambulatory Visit: Payer: Self-pay | Admitting: Internal Medicine

## 2017-01-08 ENCOUNTER — Other Ambulatory Visit: Payer: Self-pay | Admitting: Internal Medicine

## 2017-01-18 ENCOUNTER — Other Ambulatory Visit: Payer: Self-pay | Admitting: Physician Assistant

## 2017-01-19 ENCOUNTER — Other Ambulatory Visit: Payer: Self-pay | Admitting: Internal Medicine

## 2017-01-30 ENCOUNTER — Ambulatory Visit: Payer: Self-pay | Admitting: Physician Assistant

## 2017-02-16 ENCOUNTER — Other Ambulatory Visit: Payer: Self-pay | Admitting: Physician Assistant

## 2017-02-27 ENCOUNTER — Encounter: Payer: Self-pay | Admitting: Physician Assistant

## 2017-02-27 ENCOUNTER — Ambulatory Visit (INDEPENDENT_AMBULATORY_CARE_PROVIDER_SITE_OTHER): Payer: BLUE CROSS/BLUE SHIELD | Admitting: Physician Assistant

## 2017-02-27 VITALS — BP 136/80 | HR 86 | Temp 97.4°F | Resp 14 | Ht 67.0 in | Wt 242.6 lb

## 2017-02-27 DIAGNOSIS — E118 Type 2 diabetes mellitus with unspecified complications: Secondary | ICD-10-CM | POA: Diagnosis not present

## 2017-02-27 DIAGNOSIS — E039 Hypothyroidism, unspecified: Secondary | ICD-10-CM

## 2017-02-27 DIAGNOSIS — Z79899 Other long term (current) drug therapy: Secondary | ICD-10-CM | POA: Diagnosis not present

## 2017-02-27 DIAGNOSIS — N182 Chronic kidney disease, stage 2 (mild): Secondary | ICD-10-CM | POA: Diagnosis not present

## 2017-02-27 DIAGNOSIS — I10 Essential (primary) hypertension: Secondary | ICD-10-CM

## 2017-02-27 DIAGNOSIS — E785 Hyperlipidemia, unspecified: Secondary | ICD-10-CM

## 2017-02-27 DIAGNOSIS — R0989 Other specified symptoms and signs involving the circulatory and respiratory systems: Secondary | ICD-10-CM | POA: Insufficient documentation

## 2017-02-27 LAB — HEPATIC FUNCTION PANEL
ALK PHOS: 108 U/L (ref 33–130)
ALT: 27 U/L (ref 6–29)
AST: 22 U/L (ref 10–35)
Albumin: 3.8 g/dL (ref 3.6–5.1)
BILIRUBIN DIRECT: 0.1 mg/dL (ref <=0.2)
BILIRUBIN INDIRECT: 0.4 mg/dL (ref 0.2–1.2)
BILIRUBIN TOTAL: 0.5 mg/dL (ref 0.2–1.2)
TOTAL PROTEIN: 6.5 g/dL (ref 6.1–8.1)

## 2017-02-27 LAB — BASIC METABOLIC PANEL WITH GFR
BUN: 19 mg/dL (ref 7–25)
CO2: 27 mmol/L (ref 20–31)
Calcium: 9 mg/dL (ref 8.6–10.4)
Chloride: 105 mmol/L (ref 98–110)
Creat: 1.07 mg/dL — ABNORMAL HIGH (ref 0.50–0.99)
GFR, EST AFRICAN AMERICAN: 61 mL/min (ref >=60)
GFR, EST NON AFRICAN AMERICAN: 53 mL/min — AB (ref >=60)
Glucose, Bld: 95 mg/dL (ref 65–99)
POTASSIUM: 3.9 mmol/L (ref 3.5–5.3)
SODIUM: 141 mmol/L (ref 135–146)

## 2017-02-27 LAB — CBC WITH DIFFERENTIAL/PLATELET
BASOS ABS: 0 {cells}/uL (ref 0–200)
Basophils Relative: 0 %
EOS PCT: 2 %
Eosinophils Absolute: 248 cells/uL (ref 15–500)
HCT: 40.6 % (ref 35.0–45.0)
HEMOGLOBIN: 13.2 g/dL (ref 11.7–15.5)
LYMPHS ABS: 4464 {cells}/uL — AB (ref 850–3900)
Lymphocytes Relative: 36 %
MCH: 27 pg (ref 27.0–33.0)
MCHC: 32.5 g/dL (ref 32.0–36.0)
MCV: 83 fL (ref 80.0–100.0)
MONOS PCT: 7 %
MPV: 10.2 fL (ref 7.5–12.5)
Monocytes Absolute: 868 cells/uL (ref 200–950)
NEUTROS PCT: 55 %
Neutro Abs: 6820 cells/uL (ref 1500–7800)
Platelets: 295 10*3/uL (ref 140–400)
RBC: 4.89 MIL/uL (ref 3.80–5.10)
RDW: 15.3 % — AB (ref 11.0–15.0)
WBC: 12.4 10*3/uL — AB (ref 3.8–10.8)

## 2017-02-27 LAB — LIPID PANEL
Cholesterol: 138 mg/dL (ref <200)
HDL: 35 mg/dL — AB (ref >50)
LDL Cholesterol: 65 mg/dL (ref <100)
Total CHOL/HDL Ratio: 3.9 Ratio (ref <5.0)
Triglycerides: 191 mg/dL — ABNORMAL HIGH (ref <150)
VLDL: 38 mg/dL — ABNORMAL HIGH (ref <30)

## 2017-02-27 LAB — TSH: TSH: 0.18 m[IU]/L — AB

## 2017-02-27 MED ORDER — NAPROXEN 500 MG PO TABS: 500.0000 mg | ORAL_TABLET | Freq: Two times a day (BID) | ORAL | 3 refills | Status: DC

## 2017-02-27 NOTE — Patient Instructions (Addendum)
Try the tumeric, this may be able to replace the mobic Take twice daily with food  Stop the nortriptyline and the amitriptyline  Be on the Krill oil OR the omega 3 oil, Do NOT need both  Stop the calcium  Try the topamax, if this does not help than we can try you on gabapentin Can take 1-3 hours to kick in   Simple math prevails.    1st - exercise does not produce significant weight loss - at best one converts fat into muscle , "bulks up", loses inches, but usually stays "weight neutral"     2nd - think of your body weightas a check book: If you eat more calories than you burn up - you save money or gain weight .... Or if you spend more money than you put in the check book, ie burn up more calories than you eat, then you lose weight     3rd - if you walk or run 1 mile, you burn up 100 calories - you have to burn up 3,500 calories to lose 1 pound, ie you have to walk/run 35 miles to lose 1 measly pound. So if you want to lose 10 #, then you have to walk/run 350 miles, so.... clearly exercise is not the solution.     4. So if you consume 1,500 calories, then you have to burn up the equivalent of 15 miles to stay weight neutral - It also stands to reason that if you consume 1,500 cal/day and don't lose weight, then you must be burning up about 1,500 cals/day to stay weight neutral.     5. If you really want to lose weight, you must cut your calorie intake 300 calories /day and at that rate you should lose about 1 # every 3 days.   6. Please purchase Dr Fara Olden Fuhrman's book(s) "The End of Dieting" & "Eat to Live" . It has some great concepts and recipes.         Bad carbs also include fruit juice, alcohol, and sweet tea. These are empty calories that do not signal to your brain that you are full.   Please remember the good carbs are still carbs which convert into sugar. So please measure them out no more than 1/2-1 cup of rice, oatmeal, pasta, and beans  Veggies are however free  foods! Pile them on.   Not all fruit is created equal. Please see the list below, the fruit at the bottom is higher in sugars than the fruit at the top. Please avoid all dried fruits.

## 2017-02-27 NOTE — Progress Notes (Signed)
Assessment and Plan:   Hyperlipidemia, unspecified hyperlipidemia type -continue medications, check lipids, decrease fatty foods, increase activity.  -     Lipid panel  Essential hypertension - continue medications, DASH diet, exercise and monitor at home. Call if greater than 130/80.  -     CBC with Differential/Platelet -     BASIC METABOLIC PANEL WITH GFR -     Hepatic function panel -     TSH  Controlled type 2 diabetes mellitus with complication, without long-term current use of insulin (HCC) Discussed general issues about diabetes pathophysiology and management., Educational material distributed., Suggested low cholesterol diet., Encouraged aerobic exercise., Discussed foot care., Reminded to get yearly retinal exam. -     Hemoglobin A1c  CKD (chronic kidney disease) stage 2, GFR 60-89 ml/min Increase fluids, avoid NSAIDS, monitor sugars, will monitor -     BASIC METABOLIC PANEL WITH GFR  Peripheral autonomic neuropathy of unknown cause Stop elavil and nortriptyline and switch to topamax, can continue lyrica will switch to gabapentin -     topiramate (TOPAMAX) 50 MG tablet; 1/2-1 tablet at night  Medication management -     Magnesium  Morbid Obesity with co morbidities - long discussion about weight loss, diet, and exercise  Continue diet and meds as discussed. Further disposition pending results of labs. Over 30 minutes of exam, counseling, chart review, and critical decision making was performed  Future Appointments Date Time Provider [place]  08/10/2017 2:00 PM Vicie Mutters, PA-C [id number] None     HPI 71 y.o. female  presents for 3 month follow up on hypertension, cholesterol, prediabetes, and vitamin D deficiency.   Her blood pressure has been controlled at home, has been good, today their BP is BP: 136/80  She does workout. She denies chest pain, shortness of breath, dizziness.  She is on cholesterol medication and denies myalgias. Her cholesterol  is at goal. The cholesterol last visit was:   Lab Results  Component Value Date   CHOL 129 10/26/2016   HDL 33 (L) 10/26/2016   LDLCALC 53 10/26/2016   TRIG 216 (H) 10/26/2016   CHOLHDL 3.9 10/26/2016   She has been working on diet and exercise for Diabetes with diabetic chronic kidney disease, she is on bASA, she is on ACE/ARB, she is off estrogen now, she has idiopathic paraesthsias and is on lyrica/elavil and denies paresthesia of the feet, polydipsia, polyuria and visual disturbances. Last A1C was:  Lab Results  Component Value Date   HGBA1C 6.3 (H) 10/26/2016   Patient is on Vitamin D supplement.   Lab Results  Component Value Date   VD25OH 46 07/26/2016     BMI is Body mass index is 38 kg/m., she is working on diet and exercise. Wt Readings from Last 3 Encounters:  02/27/17 242 lb 9.6 oz (110 kg)  11/07/16 236 lb (107 kg)  10/26/16 245 lb (111.1 kg)   She is on thyroid medication. Her medication was not changed last visit.   Lab Results  Component Value Date   TSH 0.82 10/26/2016  .   Current Medications:  Current Outpatient Prescriptions on File Prior to Visit  Medication Sig Dispense Refill  . amitriptyline (ELAVIL) 25 MG tablet TAKE 1 TABLET TWICE A DAY 180 tablet 0  . aspirin 81 MG tablet Take 81 mg by mouth daily.    Marland Kitchen atorvastatin (LIPITOR) 40 MG tablet TAKE 1 TABLET AT BEDTIME   FOR CHOLESTEROL 90 tablet 0  . AZO-CRANBERRY PO Take 2  tablets by mouth daily.    Marland Kitchen b complex vitamins tablet Take 1 tablet by mouth daily.    . benzonatate (TESSALON PERLES) 100 MG capsule Take 2 capsules (200 mg total) by mouth 3 (three) times daily as needed for cough (Max: 600mg  per day). 60 capsule 0  . CALCIUM PO Take 1,200 mg by mouth daily.     . cyclobenzaprine (FLEXERIL) 10 MG tablet TAKE 1/2 TO 1 TABLET 3     TIMES A DAY AS NEEDED FOR  MUSCLE SPASM 90 tablet 0  . diltiazem (DILACOR XR) 240 MG 24 hr capsule TAKE 1 CAPSULE DAILY 90 capsule 1  . esomeprazole (NEXIUM) 40 MG  capsule Take 1 capsule (40 mg total) by mouth daily. 90 capsule 1  . estradiol (ESTRACE) 0.5 MG tablet TAKE 1 TABLET DAILY 90 tablet 0  . fluconazole (DIFLUCAN) 150 MG tablet Take 1 tablet (150 mg total) by mouth daily. 1 tablet 3  . furosemide (LASIX) 20 MG tablet Take 1 tablet (20 mg total) by mouth 2 (two) times daily. 180 tablet 1  . MEGARED OMEGA-3 KRILL OIL 500 MG CAPS Take 1 capsule by mouth daily.    . meloxicam (MOBIC) 7.5 MG tablet TAKE 1 TABLET DAILY WITH   FOOD FOR PAIN 90 tablet 1  . Multiple Vitamin (MULTIVITAMIN) tablet Take 1 tablet by mouth daily.    . nortriptyline (PAMELOR) 25 MG capsule Take 25 mg by mouth 2 (two) times daily.     . Omega-3 Fatty Acids (OMEGA 3 PO) Take 1,000 mg by mouth daily.    . pregabalin (LYRICA) 75 MG capsule Take 1 capsule (75 mg total) by mouth 3 (three) times daily. 270 capsule 3  . SYNTHROID 125 MCG tablet TAKE 1 TABLET DAILY FOR    THYROID *ABBVIE** 90 tablet 0  . topiramate (TOPAMAX) 50 MG tablet 1/2-1 tablet at night 30 tablet 2   No current facility-administered medications on file prior to visit.    Medical History:  Past Medical History:  Diagnosis Date  . Anemia   . Chest tightness   . Fatigue   . GERD (gastroesophageal reflux disease)   . Hyperlipidemia   . Hypothyroid   . Myalgia   . Obesity   . Prediabetes   . Ulcer ([place])   . Varicose veins    Allergies:  Allergies  Allergen Reactions  . Levaquin [Levofloxacin In D5w]   . Macrobid [Nitrofurantoin Macrocrystal]   . Penicillins     REACTION: rash  . Procaine Hcl     REACTION: difficulty breathing  . Sulfonamide Derivatives     REACTION: hives  . Tetanus Toxoid     REACTION: arm swells     Review of Systems:  Review of Systems  Constitutional: Negative.   HENT: Negative.   Eyes: Negative.   Respiratory: Negative.   Cardiovascular: Negative.   Gastrointestinal: Negative.   Genitourinary: Negative.   Musculoskeletal: Negative.   Skin: Negative.    Neurological: Negative.   Endo/Heme/Allergies: Negative.   Psychiatric/Behavioral: Negative.     Family history- Review and unchanged Social history- Review and unchanged Physical Exam: BP 136/80   Pulse 86   Temp 97.4 F (36.3 C)   Resp 14   Ht 5\' 7"  (1.702 m)   Wt 242 lb 9.6 oz (110 kg)   SpO2 95%   BMI 38.00 kg/m  Wt Readings from Last 3 Encounters:  02/27/17 242 lb 9.6 oz (110 kg)  11/07/16 236 lb (107 kg)  10/26/16 245 lb (111.1 kg)   General Appearance: Well nourished, in no apparent distress. Eyes: PERRLA, EOMs, conjunctiva no swelling or erythema Sinuses: No Frontal/maxillary tenderness ENT/Mouth: Ext aud canals clear, TMs without erythema, bulging. No erythema, swelling, or exudate on post pharynx.  Tonsils not swollen or erythematous. Hearing normal.  Neck: Supple, thyroid normal.  Respiratory: Respiratory effort normal, BS equal bilaterally without rales, rhonchi, wheezing or stridor.  Cardio: RRR with no MRGs. Brisk peripheral pulses without edema.  Abdomen: Soft, + BS,  Non tender, no guarding, rebound, hernias, masses. Lymphatics: Non tender without lymphadenopathy.  Musculoskeletal: Full ROM, 5/5 strength, Normal gait Skin: left arm with dermafibroma/lipoma, wants removed. Warm, dry without rashes, lesions, ecchymosis.  Neuro: Cranial nerves intact. Normal muscle tone, no cerebellar symptoms. Psych: Awake and oriented X 3, normal affect, Insight and Judgment appropriate.    Vicie Mutters, PA-C 4:30 PM [facility]

## 2017-02-28 ENCOUNTER — Other Ambulatory Visit: Payer: Self-pay | Admitting: Physician Assistant

## 2017-02-28 DIAGNOSIS — Z79899 Other long term (current) drug therapy: Secondary | ICD-10-CM

## 2017-02-28 DIAGNOSIS — E039 Hypothyroidism, unspecified: Secondary | ICD-10-CM

## 2017-02-28 LAB — HEMOGLOBIN A1C
HEMOGLOBIN A1C: 6.2 % — AB (ref <5.7)
MEAN PLASMA GLUCOSE: 131 mg/dL

## 2017-02-28 LAB — MAGNESIUM: Magnesium: 2.1 mg/dL (ref 1.5–2.5)

## 2017-02-28 NOTE — Progress Notes (Signed)
Pt aware of lab results & voiced understanding of those results.

## 2017-03-01 ENCOUNTER — Other Ambulatory Visit: Payer: Self-pay | Admitting: Internal Medicine

## 2017-03-01 ENCOUNTER — Other Ambulatory Visit: Payer: Self-pay | Admitting: Physician Assistant

## 2017-03-13 ENCOUNTER — Ambulatory Visit (INDEPENDENT_AMBULATORY_CARE_PROVIDER_SITE_OTHER): Payer: BLUE CROSS/BLUE SHIELD | Admitting: Physician Assistant

## 2017-03-13 ENCOUNTER — Encounter: Payer: Self-pay | Admitting: Physician Assistant

## 2017-03-13 VITALS — BP 150/80 | HR 88 | Temp 98.1°F | Resp 14 | Wt 232.6 lb

## 2017-03-13 DIAGNOSIS — R109 Unspecified abdominal pain: Secondary | ICD-10-CM

## 2017-03-13 LAB — CBC WITH DIFFERENTIAL/PLATELET
Basophils Absolute: 0 cells/uL (ref 0–200)
Basophils Relative: 0 %
EOS PCT: 1 %
Eosinophils Absolute: 137 cells/uL (ref 15–500)
HCT: 44.4 % (ref 35.0–45.0)
Hemoglobin: 14.7 g/dL (ref 11.7–15.5)
LYMPHS ABS: 3425 {cells}/uL (ref 850–3900)
LYMPHS PCT: 25 %
MCH: 27.6 pg (ref 27.0–33.0)
MCHC: 33.1 g/dL (ref 32.0–36.0)
MCV: 83.3 fL (ref 80.0–100.0)
MPV: 10.1 fL (ref 7.5–12.5)
Monocytes Absolute: 1096 cells/uL — ABNORMAL HIGH (ref 200–950)
Monocytes Relative: 8 %
NEUTROS PCT: 66 %
Neutro Abs: 9042 cells/uL — ABNORMAL HIGH (ref 1500–7800)
PLATELETS: 337 10*3/uL (ref 140–400)
RBC: 5.33 MIL/uL — AB (ref 3.80–5.10)
RDW: 15.4 % — AB (ref 11.0–15.0)
WBC: 13.7 10*3/uL — ABNORMAL HIGH (ref 3.8–10.8)

## 2017-03-13 LAB — BASIC METABOLIC PANEL WITH GFR
BUN: 13 mg/dL (ref 7–25)
CALCIUM: 9.1 mg/dL (ref 8.6–10.4)
CO2: 20 mmol/L (ref 20–31)
Chloride: 106 mmol/L (ref 98–110)
Creat: 0.93 mg/dL (ref 0.50–0.99)
GFR, EST NON AFRICAN AMERICAN: 63 mL/min (ref >=60)
GFR, Est African American: 73 mL/min (ref >=60)
Glucose, Bld: 121 mg/dL — ABNORMAL HIGH (ref 65–99)
Potassium: 3.7 mmol/L (ref 3.5–5.3)
Sodium: 142 mmol/L (ref 135–146)

## 2017-03-13 LAB — HEPATIC FUNCTION PANEL
ALT: 26 U/L (ref 6–29)
AST: 18 U/L (ref 10–35)
Albumin: 4.2 g/dL (ref 3.6–5.1)
Alkaline Phosphatase: 122 U/L (ref 33–130)
BILIRUBIN DIRECT: 0.2 mg/dL (ref <=0.2)
BILIRUBIN INDIRECT: 0.6 mg/dL (ref 0.2–1.2)
TOTAL PROTEIN: 7.2 g/dL (ref 6.1–8.1)
Total Bilirubin: 0.8 mg/dL (ref 0.2–1.2)

## 2017-03-13 MED ORDER — FLUCONAZOLE 150 MG PO TABS: 150.0000 mg | ORAL_TABLET | Freq: Every day | ORAL | 3 refills | Status: DC

## 2017-03-13 MED ORDER — ONDANSETRON HCL 4 MG PO TABS: 4.0000 mg | ORAL_TABLET | Freq: Every day | ORAL | 1 refills | Status: AC | PRN

## 2017-03-13 MED ORDER — CEFUROXIME AXETIL 250 MG PO TABS: 250.0000 mg | ORAL_TABLET | Freq: Two times a day (BID) | ORAL | 0 refills | Status: DC

## 2017-03-13 NOTE — Progress Notes (Signed)
Subjective:    Patient ID: [name], female    DOB: 03-08-1946, 71 y.o.   MRN: [id number]  HPI 71 y.o. obese WF presents with several symptoms.  Has had fatigue x 1 week, took Friday off, and started to have pain across her back and what feels like "menstral cramps in her AB" worse with movement, + chills, fever., decreased appetite, had diarrhea Friday x 1 took imodium that helped.  No history of kidney stones or diverticulitis, + history of UTIs.  Blood pressure (!) 150/80, pulse 88, temperature 98.1 F (36.7 C), resp. rate 14, weight 232 lb 9.6 oz (105.5 kg), SpO2 96 %.  Medications Current Outpatient Prescriptions on File Prior to Visit  Medication Sig  . aspirin 81 MG tablet Take 81 mg by mouth daily.  Marland Kitchen atorvastatin (LIPITOR) 40 MG tablet TAKE 1 TABLET AT BEDTIME   FOR CHOLESTEROL  . AZO-CRANBERRY PO Take 2 tablets by mouth daily.  Marland Kitchen b complex vitamins tablet Take 1 tablet by mouth daily.  Marland Kitchen diltiazem (DILACOR XR) 240 MG 24 hr capsule TAKE 1 CAPSULE DAILY  . esomeprazole (NEXIUM) 40 MG capsule Take 1 capsule (40 mg total) by mouth daily.  . furosemide (LASIX) 20 MG tablet Take 1 tablet (20 mg total) by mouth 2 (two) times daily.  Marland Kitchen MEGARED OMEGA-3 KRILL OIL 500 MG CAPS Take 1 capsule by mouth daily.  . meloxicam (MOBIC) 7.5 MG tablet TAKE 1 TABLET DAILY WITH   FOOD FOR PAIN  . Multiple Vitamin (MULTIVITAMIN) tablet Take 1 tablet by mouth daily.  . pregabalin (LYRICA) 75 MG capsule Take 1 capsule (75 mg total) by mouth 3 (three) times daily.  Marland Kitchen SYNTHROID 125 MCG tablet TAKE 1 TABLET DAILY FOR    THYROID *ABBVIE**  . topiramate (TOPAMAX) 50 MG tablet 1/2-1 tablet at night   No current facility-administered medications on file prior to visit.     Problem list She has Internal hemorrhoids; GERD; Diverticulosis of large intestine; History of tobacco use; Hyperlipidemia; Hypothyroid; Anemia; Diabetes mellitus type 2, controlled, with complications ([place]); Morbid obesity ([place]);  Chronic venous insufficiency; CKD (chronic kidney disease) stage 2, GFR 60-89 ml/min; Peripheral autonomic neuropathy of unknown cause; and Hypertension on her problem list.   Review of Systems  Constitutional: Positive for chills and fatigue. Negative for diaphoresis and fever.  HENT: Negative.   Respiratory: Negative.  Negative for cough.   Cardiovascular: Negative.   Gastrointestinal: Positive for abdominal pain, diarrhea and nausea. Negative for abdominal distention, anal bleeding, blood in stool, constipation, rectal pain and vomiting.  Genitourinary: Positive for flank pain and frequency.  Musculoskeletal: Positive for back pain. Negative for arthralgias, gait problem, joint swelling, myalgias, neck pain and neck stiffness.  Skin: Negative.   Neurological: Negative for dizziness and headaches.       Objective:   Physical Exam  Constitutional: She is oriented to person, place, and time. She appears well-developed and well-nourished.  Neck: Normal range of motion. Neck supple.  Cardiovascular: Normal rate and regular rhythm.   Pulmonary/Chest: Effort normal and breath sounds normal.  Abdominal: Soft. Bowel sounds are normal. She exhibits no distension and no mass. There is tenderness (suprapubic). There is no rebound and no guarding.  Musculoskeletal: Normal range of motion. She exhibits no tenderness.  Neurological: She is alert and oriented to person, place, and time.  Skin: Skin is warm and dry.      Assessment & Plan:  1. Abdominal pain, unspecified abdominal location No rebound tenderness,  no peritoneal symptoms Given ceftin for possible UTI, if worse will call the office or go to the ER - CBC with Differential/Platelet - BASIC METABOLIC PANEL WITH GFR - Hepatic function panel - Urinalysis, Routine w reflex microscopic - Urine culture

## 2017-03-13 NOTE — Patient Instructions (Signed)

## 2017-03-14 LAB — URINALYSIS, ROUTINE W REFLEX MICROSCOPIC
BILIRUBIN URINE: NEGATIVE
Glucose, UA: NEGATIVE
Hgb urine dipstick: NEGATIVE
KETONES UR: NEGATIVE
Leukocytes, UA: NEGATIVE
NITRITE: NEGATIVE
Protein, ur: NEGATIVE
Specific Gravity, Urine: 1.009 (ref 1.001–1.035)
pH: 7 (ref 5.0–8.0)

## 2017-03-14 LAB — URINE CULTURE: ORGANISM ID, BACTERIA: NO GROWTH

## 2017-03-14 NOTE — Progress Notes (Signed)
Pt would like diflucan sent to her pharmacy please.

## 2017-03-14 NOTE — Progress Notes (Signed)
Pt aware of lab results & voiced understanding of those results.

## 2017-03-16 ENCOUNTER — Other Ambulatory Visit: Payer: Self-pay | Admitting: Physician Assistant

## 2017-03-23 ENCOUNTER — Ambulatory Visit (INDEPENDENT_AMBULATORY_CARE_PROVIDER_SITE_OTHER): Payer: BLUE CROSS/BLUE SHIELD | Admitting: Internal Medicine

## 2017-03-23 ENCOUNTER — Ambulatory Visit
Admission: RE | Admit: 2017-03-23 | Discharge: 2017-03-23 | Disposition: A | Discharge status: Home / Self Care | Payer: BLUE CROSS/BLUE SHIELD | Source: Ambulatory Visit | Attending: Internal Medicine | Admitting: Internal Medicine

## 2017-03-23 ENCOUNTER — Encounter: Payer: Self-pay | Admitting: Internal Medicine

## 2017-03-23 ENCOUNTER — Telehealth: Payer: Self-pay | Admitting: *Deleted

## 2017-03-23 VITALS — BP 132/72 | HR 80 | Temp 97.3°F | Resp 16 | Ht 67.0 in | Wt 233.8 lb

## 2017-03-23 DIAGNOSIS — R1030 Lower abdominal pain, unspecified: Secondary | ICD-10-CM

## 2017-03-23 DIAGNOSIS — R102 Pelvic and perineal pain: Secondary | ICD-10-CM | POA: Diagnosis not present

## 2017-03-23 LAB — CBC WITH DIFFERENTIAL/PLATELET
BASOS ABS: 0 {cells}/uL (ref 0–200)
Basophils Relative: 0 %
EOS PCT: 1 %
Eosinophils Absolute: 174 cells/uL (ref 15–500)
HEMATOCRIT: 40.2 % (ref 35.0–45.0)
HEMOGLOBIN: 13.1 g/dL (ref 11.7–15.5)
LYMPHS ABS: 3654 {cells}/uL (ref 850–3900)
LYMPHS PCT: 21 %
MCH: 26.9 pg — AB (ref 27.0–33.0)
MCHC: 32.6 g/dL (ref 32.0–36.0)
MCV: 82.5 fL (ref 80.0–100.0)
MONO ABS: 1392 {cells}/uL — AB (ref 200–950)
MPV: 9.6 fL (ref 7.5–12.5)
Monocytes Relative: 8 %
NEUTROS PCT: 70 %
Neutro Abs: 12180 cells/uL — ABNORMAL HIGH (ref 1500–7800)
Platelets: 359 10*3/uL (ref 140–400)
RBC: 4.87 MIL/uL (ref 3.80–5.10)
RDW: 15.3 % — AB (ref 11.0–15.0)
WBC: 17.4 10*3/uL — ABNORMAL HIGH (ref 3.8–10.8)

## 2017-03-23 MED ORDER — CIPROFLOXACIN HCL 500 MG PO TABS: ORAL_TABLET | ORAL | 0 refills | Status: DC

## 2017-03-23 MED ORDER — DICYCLOMINE HCL 20 MG PO TABS: 20.0000 mg | ORAL_TABLET | Freq: Three times a day (TID) | ORAL | 0 refills | Status: DC | PRN

## 2017-03-23 MED ORDER — IOPAMIDOL (ISOVUE-300) INJECTION 61%
100.0000 mL | Freq: Once | INTRAVENOUS | Status: AC | PRN
  Administered 2017-03-23: 100 mL via INTRAVENOUS

## 2017-03-23 MED ORDER — METRONIDAZOLE 500 MG PO TABS: ORAL_TABLET | ORAL | 0 refills | Status: AC

## 2017-03-23 NOTE — Progress Notes (Signed)
Subjective:    Patient ID: [name], female    DOB: [birth year], 71 y.o.   MRN: [id number]  HPI  This very nice 71 yo MWF returns with c/o of lower abdominal pain not resolved after treatment for suspected UTI. Denies  N/V, D/C, fever/chills/sweats or rashes. She is s/p TAH/BSO and she "thinks they took 'her' appendix".   Medication Sig  . aspirin 81 MG tablet Take  daily.  Marland Kitchen atorvastatin  40 MG tablet TAKE 1 TAB AT BEDTIME FOR CHOLESTEROL  . AZO-CRANBERRY  Take 2 tab daily.  Marland Kitchen b complex vitamins  Take 1 tab daily.  Marland Kitchen diltiazem  XR 240 MG  TAKE 1 CAPSULE DAILY  . esomeprazole 40 MG capsule Take 1 cap daily.  Marland Kitchen DIFLUCAN 150 MG tablet Take 1 tab daily.- prn  . furosemide  20 MG tablet TAKE 1 TABLET TWICE A DAY  . MEGARED  KRILL OIL 500 MG  Take 1 caph daily.  . meloxicam 7.5 MG tablet TAKE 1 TAB DAILY WITH   FOOD FOR PAIN  . Multiple Vitamin  Take 1 tab daily.  . ondansetron  4 MG tablet Take 1 tab daily as needed for nausea or vomiting.  . pregabalin 75 MG capsule Take 1 cap 3 x  daily.  Marland Kitchen SYNTHROID 125 MCG tablet TAKE 1 TAB DAILY FOR    THYROID *ABBVIE**  . topiramate50 MG tablet 1/2-1 tab at night  . cefUROXime  250 MG tablet Completed yesterday    Allergies  Allergen Reactions  . Levaquin [Levofloxacin In D5w]   . Macrobid [Nitrofurantoin Macrocrystal]   . Penicillins     REACTION: rash  . Procaine Hcl     REACTION: difficulty breathing  . Sulfonamide Derivatives     REACTION: hives  . Tetanus Toxoid     REACTION: arm swells   Past Medical History:  Diagnosis Date  . Anemia   . Chest tightness   . Fatigue   . GERD (gastroesophageal reflux disease)   . Hyperlipidemia   . Hypothyroid   . Myalgia   . Obesity   . Prediabetes   . Ulcer   . Varicose veins    Past Surgical History:  Procedure Laterality Date  . ABDOMINAL HYSTERECTOMY    . CATARACT EXTRACTION    . NASAL SINUS SURGERY    . ROTATOR CUFF REPAIR Left more than 7 years ago   Dr. Percell Miller and Dr.  Earleen Newport  . TONSILLECTOMY AND ADENOIDECTOMY     Review of Systems  10 point systems review negative except as above.    Objective:   Physical Exam  BP 132/72   Pulse 80   Temp 97.3 F (36.3 C)   Resp 16   Ht 5\' 7"  (1.702 m)   Wt 233 lb 12.8 oz (106.1 kg)   BMI 36.62 kg/m   HEENT - WNL. Neck - supple.  Chest - Clear equal BS. Cor - Nl HS. RRR w/o sig MGR. PP 1(+). No edema. Abd- Soft with Nl BS. There is tenderness in th L>RLQ w/o rebound.  MS- FROM w/o deformities.  Gait Nl. Neuro -  Nl w/o focal abnormalities.     Assessment & Plan:   1. Lower abdominal pain - suspect Diverticulitis   2. Pelvic pain  - CBC with Differential/Platelet  - Urinalysis, Routine w reflex microscopic - Urine culture  - metroNIDAZOLE (FLAGYL) 500 MG tablet; Take 1 tablet 3 x/ day for Infection  Dispense: 30 tablet; Refill: 0 -  ciprofloxacin (CIPRO) 500 MG tablet; Take 1 tablet 2 x/ day for Infection  Dispense: 20 tablet; Refill: 0  - CT Abdomen Pelvis W Contrast; Future  - dicyclomine (BENTYL) 20 MG tablet; Take 1 tablet (20 mg total) by mouth 3 (three) times daily as needed for spasms.  Dispense: 30 tablet; Refill: 0  - discussed meds/SE's and warning signs to go to the ER for.

## 2017-03-23 NOTE — Patient Instructions (Signed)

## 2017-03-23 NOTE — Telephone Encounter (Signed)
The patient was called to inform her of CT results.  She was informed she has diverticulitis/colitis and she should finish the medications and have a follow up with Vicie Mutters, PA after she finishes the meds, per Dr Melford Aase.

## 2017-03-24 LAB — URINALYSIS, ROUTINE W REFLEX MICROSCOPIC
BILIRUBIN URINE: NEGATIVE
Glucose, UA: NEGATIVE
Hgb urine dipstick: NEGATIVE
KETONES UR: NEGATIVE
Leukocytes, UA: NEGATIVE
Nitrite: NEGATIVE
PH: 6.5 (ref 5.0–8.0)
Protein, ur: NEGATIVE
Specific Gravity, Urine: 1.016 (ref 1.001–1.035)

## 2017-03-27 ENCOUNTER — Other Ambulatory Visit: Payer: Self-pay | Admitting: Internal Medicine

## 2017-03-27 LAB — URINE CULTURE

## 2017-03-27 MED ORDER — LEVOFLOXACIN 500 MG PO TABS: 500.0000 mg | ORAL_TABLET | Freq: Every day | ORAL | 0 refills | Status: AC

## 2017-03-30 ENCOUNTER — Telehealth: Payer: Self-pay | Admitting: *Deleted

## 2017-03-30 NOTE — Telephone Encounter (Signed)
Patient called and states she is concerned about starting the Levaquin due to the side effects on the patient information handout.  Per Dr Melford Aase, she should take the Levaquin and also keep her [facility] appointment on 04/03/2017.  Patient is aware.

## 2017-03-30 NOTE — Telephone Encounter (Signed)
error 

## 2017-04-01 ENCOUNTER — Other Ambulatory Visit: Payer: Self-pay | Admitting: Physician Assistant

## 2017-04-03 ENCOUNTER — Other Ambulatory Visit: Payer: PPO

## 2017-04-03 DIAGNOSIS — E039 Hypothyroidism, unspecified: Secondary | ICD-10-CM | POA: Diagnosis not present

## 2017-04-03 DIAGNOSIS — I10 Essential (primary) hypertension: Secondary | ICD-10-CM | POA: Diagnosis not present

## 2017-04-03 DIAGNOSIS — D72829 Elevated white blood cell count, unspecified: Secondary | ICD-10-CM

## 2017-04-03 LAB — CBC WITH DIFFERENTIAL/PLATELET
Basophils Absolute: 138 cells/uL (ref 0–200)
Basophils Relative: 1 %
EOS PCT: 2 %
Eosinophils Absolute: 276 cells/uL (ref 15–500)
HCT: 41.2 % (ref 35.0–45.0)
HEMOGLOBIN: 13.5 g/dL (ref 11.7–15.5)
LYMPHS ABS: 5106 {cells}/uL — AB (ref 850–3900)
Lymphocytes Relative: 37 %
MCH: 26.9 pg — ABNORMAL LOW (ref 27.0–33.0)
MCHC: 32.8 g/dL (ref 32.0–36.0)
MCV: 82.2 fL (ref 80.0–100.0)
MONOS PCT: 8 %
MPV: 9.8 fL (ref 7.5–12.5)
Monocytes Absolute: 1104 cells/uL — ABNORMAL HIGH (ref 200–950)
NEUTROS ABS: 7176 {cells}/uL (ref 1500–7800)
Neutrophils Relative %: 52 %
PLATELETS: 353 10*3/uL (ref 140–400)
RBC: 5.01 MIL/uL (ref 3.80–5.10)
RDW: 15.7 % — ABNORMAL HIGH (ref 11.0–15.0)
WBC: 13.8 10*3/uL — AB (ref 3.8–10.8)

## 2017-04-04 ENCOUNTER — Encounter: Payer: Self-pay | Admitting: Physician Assistant

## 2017-04-04 LAB — TSH: TSH: 2.47 m[IU]/L

## 2017-04-04 NOTE — Progress Notes (Signed)
Pt aware of lab results & voiced understanding of those results. Pt states pain is much better & has since went back to work.

## 2017-04-04 NOTE — Progress Notes (Signed)
LVM for pt to return office call for LAB results.

## 2017-05-25 ENCOUNTER — Other Ambulatory Visit: Payer: Self-pay | Admitting: Internal Medicine

## 2017-05-30 ENCOUNTER — Other Ambulatory Visit: Payer: Self-pay

## 2017-05-30 DIAGNOSIS — G9009 Other idiopathic peripheral autonomic neuropathy: Secondary | ICD-10-CM

## 2017-05-30 MED ORDER — TOPIRAMATE 50 MG PO TABS: ORAL_TABLET | ORAL | 0 refills | Status: DC

## 2017-06-02 ENCOUNTER — Other Ambulatory Visit: Payer: Self-pay

## 2017-06-02 DIAGNOSIS — G9009 Other idiopathic peripheral autonomic neuropathy: Secondary | ICD-10-CM

## 2017-06-02 MED ORDER — TOPIRAMATE 50 MG PO TABS: ORAL_TABLET | ORAL | 0 refills | Status: DC

## 2017-06-05 ENCOUNTER — Other Ambulatory Visit: Payer: Self-pay | Admitting: Physician Assistant

## 2017-06-05 MED ORDER — PREGABALIN 75 MG PO CAPS: 75.0000 mg | ORAL_CAPSULE | Freq: Three times a day (TID) | ORAL | 3 refills | Status: DC

## 2017-06-14 ENCOUNTER — Other Ambulatory Visit: Payer: Self-pay | Admitting: Internal Medicine

## 2017-07-10 DIAGNOSIS — D485 Neoplasm of uncertain behavior of skin: Secondary | ICD-10-CM | POA: Diagnosis not present

## 2017-07-10 DIAGNOSIS — L905 Scar conditions and fibrosis of skin: Secondary | ICD-10-CM | POA: Diagnosis not present

## 2017-07-10 DIAGNOSIS — C44712 Basal cell carcinoma of skin of right lower limb, including hip: Secondary | ICD-10-CM | POA: Diagnosis not present

## 2017-08-09 NOTE — Progress Notes (Addendum)
WELCOME TO MEDICARE VISIT  Assessment and Plan:  Essential hypertension - continue medications, DASH diet, exercise and monitor at home. Call if greater than 130/80.  -     furosemide (LASIX) 20 MG tablet; Take 1 tablet (20 mg total) by mouth 2 (two) times daily. -     diltiazem (DILACOR XR) 240 MG 24 hr capsule; Take 1 capsule (240 mg total) by mouth daily. -     CBC with Differential/Platelet -     BASIC METABOLIC PANEL WITH GFR -     Hepatic function panel -     Urinalysis w microscopic + reflex cultur -     Microalbumin / creatinine urine ratio -     EKG 12-Lead -     [place], RETROPERITNL ABD,  LTD  Hypothyroidism, unspecified type Hypothyroidism-check TSH level, continue medications the same, reminded to take on an empty stomach 30-56mins before food.  -     levothyroxine (SYNTHROID) 125 MCG tablet; 1 pill daily -     TSH  Controlled type 2 diabetes mellitus with complication, without long-term current use of insulin ([place]) Discussed general issues about diabetes pathophysiology and management., Educational material distributed., Suggested low cholesterol diet., Encouraged aerobic exercise., Discussed foot care., Reminded to get yearly retinal exam. -     Hemoglobin A1c  Hyperlipidemia, unspecified hyperlipidemia type -continue medications, check lipids, decrease fatty foods, increase activity.  -     atorvastatin (LIPITOR) 40 MG tablet; TAKE 1 TABLET AT BEDTIME   FOR CHOLESTEROL -     Lipid panel  Morbid Obesity with co morbidities - long discussion about weight loss, diet, and exercise  Anemia, unspecified type - monitor, continue iron supp with Vitamin C and increase green leafy veggies -     CBC with Differential/Platelet  Needs flu shot -     Flu vaccine HIGH DOSE PF  Peripheral autonomic neuropathy of unknown cause May need to switch lyrica to gabaptentin  CKD (chronic kidney disease) stage 2, GFR 60-89 ml/min Increase fluids, avoid NSAIDS, monitor sugars, will  monitor -     BASIC METABOLIC PANEL WITH GFR  Internal hemorrhoids Follow up GI  Chronic venous insufficiency Compression stockings given, continue lasix weight loss advised  Gastroesophageal reflux disease with esophagitis Try to decrease nexium40  to protonix 20, and given taper/diet  Diverticulosis of large intestine without hemorrhage Increase fiber, no symptoms at this itme  History of tobacco use Remote  Encounter for Medicare annual wellness exam 1 year  Advanced care planning/counseling discussion Discussed advanced care planning with patient  Will bring in copy for [place] to scan into epic At least 30 mins discussed with the patient and/or family at the visit.   History of basal cell carcinoma (BCC) Continue follow up DERM  Medication management -     Magnesium  Sinus bradycardia Cut cardizem in half, monitor BP at home  Continue diet and meds as discussed. Further disposition pending results of labs. Over 30 minutes of exam, counseling, chart review, and critical decision making was performed  Future Appointments Date Time Provider [place]  08/16/2018 2:00 PM Vicie Mutters, PA-C [id number] None   During the course of the visit the patient was educated and counseled about appropriate screening and preventive services including:    Pneumococcal vaccine   Prevnar 13  Influenza vaccine  Td vaccine  Screening electrocardiogram  Bone densitometry screening  Colorectal cancer screening  Diabetes screening  Glaucoma screening  Nutrition counseling   Advanced directives: requested  HPI 71 y.o. female  presents for welcome to medicare wellness and 3 month follow up on hypertension, cholesterol, prediabetes, and vitamin D deficiency.   Her blood pressure has been controlled at home, has been good, today their BP is BP: 122/70  She does workout. She denies chest pain, shortness of breath, dizziness. Has been having some knee pain, will use  aspercream and aleve occ that helps.   She is on cholesterol medication and denies myalgias. Her cholesterol is at goal. The cholesterol last visit was:   Lab Results  Component Value Date   CHOL 138 02/27/2017   HDL 35 (L) 02/27/2017   LDLCALC 65 02/27/2017   TRIG 191 (H) 02/27/2017   CHOLHDL 3.9 02/27/2017   She has been working on diet and exercise for Diabetes with diabetic chronic kidney disease, she is on bASA, she is on ACE/ARB, she is off estrogen now, she has idiopathic paraesthsias and is on lyrica/elavil and denies paresthesia of the feet, polydipsia, polyuria and visual disturbances. Last A1C was:  Lab Results  Component Value Date   HGBA1C 6.2 (H) 02/27/2017   Patient is on Vitamin D supplement.   Lab Results  Component Value Date   VD25OH 46 07/26/2016     BMI is Body mass index is 34.29 kg/m., she is working on diet and exercise. Wt Readings from Last 3 Encounters:  08/10/17 222 lb 3.2 oz (100.8 kg)  03/23/17 233 lb 12.8 oz (106.1 kg)  03/13/17 232 lb 9.6 oz (105.5 kg)   She is on thyroid medication. Her medication was not changed last visit.   Lab Results  Component Value Date   TSH 2.47 04/03/2017  .  Had  Current Medications:  Current Outpatient Prescriptions on File Prior to Visit  Medication Sig Dispense Refill  . aspirin 81 MG tablet Take 81 mg by mouth daily.    Marland Kitchen atorvastatin (LIPITOR) 40 MG tablet TAKE 1 TABLET AT BEDTIME   FOR CHOLESTEROL 90 tablet 1  . AZO-CRANBERRY PO Take 2 tablets by mouth daily.    Marland Kitchen b complex vitamins tablet Take 1 tablet by mouth daily.    Marland Kitchen dicyclomine (BENTYL) 20 MG tablet Take 1 tablet (20 mg total) by mouth 3 (three) times daily as needed for spasms. 30 tablet 0  . diltiazem (DILACOR XR) 240 MG 24 hr capsule TAKE 1 CAPSULE DAILY 90 capsule 1  . esomeprazole (NEXIUM) 40 MG capsule TAKE 1 CAPSULE DAILY 90 capsule 1  . fluconazole (DIFLUCAN) 150 MG tablet Take 1 tablet (150 mg total) by mouth daily. 1 tablet 3  . furosemide  (LASIX) 20 MG tablet TAKE 1 TABLET TWICE A DAY 180 tablet 1  . MEGARED OMEGA-3 KRILL OIL 500 MG CAPS Take 1 capsule by mouth daily.    . meloxicam (MOBIC) 7.5 MG tablet TAKE 1 TABLET DAILY WITH   FOOD FOR PAIN 90 tablet 1  . Multiple Vitamin (MULTIVITAMIN) tablet Take 1 tablet by mouth daily.    . ondansetron (ZOFRAN) 4 MG tablet Take 1 tablet (4 mg total) by mouth daily as needed for nausea or vomiting. 28 tablet 1  . pregabalin (LYRICA) 75 MG capsule Take 1 capsule (75 mg total) by mouth 3 (three) times daily. 270 capsule 3  . SYNTHROID 125 MCG tablet TAKE 1 TABLET DAILY FOR    THYROID *ABBVIE** 90 tablet 1  . topiramate (TOPAMAX) 50 MG tablet 1/2-1 tablet at night 90 tablet 0   No current facility-administered medications on file prior  to visit.    Medical History:  Past Medical History:  Diagnosis Date  . Anemia   . Chest tightness   . Fatigue   . GERD (gastroesophageal reflux disease)   . Hyperlipidemia   . Hypothyroid   . Myalgia   . Obesity   . Prediabetes   . Ulcer   . Varicose veins    Health Maintenance:   Immunization History  Administered Date(s) Administered  . Influenza, High Dose Seasonal PF 08/19/2014, 09/10/2015, 07/26/2016, 08/10/2017  . Pneumococcal Conjugate-13 05/12/2014  . Pneumococcal Polysaccharide-23 09/05/2007, 06/02/2015    Tetanus: ALLERGY Pneumovax: 2016 Prevnar 2015 Flu vaccine: 2018 today Zostavax: N/A will check with insurance  Pap: 02/2011 normal declines another MGM: 08/2016 had diagnostic MGM right breast normal, need repeat screening DEXA: 07/2012 normal Colonoscopy: 2011  Due 2021 EGD: n/a CT AB 2011 Heart cath June 1999, no blockages, coronary spasms  [facility]  Dr. Nicki Reaper for eye care, checking for glaucoma, goes yearly, has OV next week Patient Care Team: Unk Pinto, MD as PCP - General (Internal Medicine) Lafayette Dragon, MD (Inactive) as Consulting Physician (Gastroenterology) Darleen Crocker, MD  as Consulting Physician (Ophthalmology) Macarthur Critchley, [place] as Referring Physician (Optometry) Crista Luria, MD as Consulting Physician (Dermatology) Ninetta Lights, MD as Consulting Physician (Orthopedic Surgery) Minus Breeding, MD as Consulting Physician (Cardiology)  Allergies Allergies  Allergen Reactions  . Macrobid [Nitrofurantoin Macrocrystal]   . Penicillins     REACTION: rash  . Procaine Hcl     REACTION: difficulty breathing  . Sulfonamide Derivatives     REACTION: hives  . Tetanus Toxoid     REACTION: arm swells    SURGICAL HISTORY She  has a past surgical history that includes Abdominal hysterectomy; Tonsillectomy and adenoidectomy; Cataract extraction; Nasal sinus surgery; and Rotator cuff repair (Left, more than 7 years ago). FAMILY HISTORY Her family history includes Coronary artery disease (age of onset: 19) in her father. SOCIAL HISTORY She  reports that she quit smoking about 4 years ago. Her smoking use included Cigarettes. She has a 23.00 pack-year smoking history. She has never used smokeless tobacco.  MEDICARE WELLNESS OBJECTIVES: Physical activity: Current Exercise Habits: The patient does not participate in regular exercise at present Cardiac risk factors: Cardiac Risk Factors include: advanced age (>81men, >40 women);diabetes mellitus;dyslipidemia;hypertension;obesity (BMI >30kg/m2);family history of premature cardiovascular disease Depression/mood screen:   Depression screen [facility] 2/9 08/10/2017  Decreased Interest 0  Down, Depressed, Hopeless 0  PHQ - 2 Score 0    ADLs:  In your present state of health, do you have any difficulty performing the following activities: 08/10/2017  Hearing? N  Vision? N  Difficulty concentrating or making decisions? N  Walking or climbing stairs? N  Dressing or bathing? N  Doing errands, shopping? N  In the past six months, have you accidently leaked urine? N  Do you have problems with loss of bowel control? N  Some  recent data might be hidden     Cognitive Testing  Alert? Yes  Normal Appearance?Yes  Oriented to person? Yes  Place? Yes   Time? Yes  Recall of three objects?  Yes  Can perform simple calculations? Yes  Displays appropriate judgment?Yes  Can read the correct time from a watch face?Yes  EOL planning: Does Patient Have a Medical Advance Directive?: Yes Type of Advance Directive: [place] will Does patient want to make changes to medical advance directive?: No - Patient declined Copy of  [place] in Chart?: No - copy requested   Review of Systems:  Review of Systems  Constitutional: Negative.   HENT: Negative.   Eyes: Negative.   Respiratory: Negative.   Cardiovascular: Negative.   Gastrointestinal: Negative.   Genitourinary: Negative.   Musculoskeletal: Negative.   Skin: Negative.   Neurological: Negative.   Endo/Heme/Allergies: Negative.   Psychiatric/Behavioral: Negative.     Physical Exam: BP 122/70   Pulse 64   Temp 97.9 F (36.6 C)   Ht 5' 7.5" (1.715 m)   Wt 222 lb 3.2 oz (100.8 kg)   SpO2 96%   BMI 34.29 kg/m  Wt Readings from Last 3 Encounters:  08/10/17 222 lb 3.2 oz (100.8 kg)  03/23/17 233 lb 12.8 oz (106.1 kg)  03/13/17 232 lb 9.6 oz (105.5 kg)   General Appearance: Well nourished, in no apparent distress. Eyes: PERRLA, EOMs, conjunctiva no swelling or erythema Sinuses: No Frontal/maxillary tenderness ENT/Mouth: Ext aud canals clear, TMs without erythema, bulging. No erythema, swelling, or exudate on post pharynx.  Tonsils not swollen or erythematous. Hearing normal.  Neck: Supple, thyroid normal.  Respiratory: Respiratory effort normal, BS equal bilaterally without rales, rhonchi, wheezing or stridor.  Cardio: RRR with no MRGs. Brisk peripheral pulses with mild edema.  Abdomen: Soft, + BS,  Non tender, no guarding, rebound, hernias, masses. Lymphatics: Non tender without lymphadenopathy.   Musculoskeletal: Full ROM, 5/5 strength, Normal gait Skin: left arm with dermafibroma/lipoma, wants removed. Warm, dry without rashes, lesions, ecchymosis.  Neuro: Cranial nerves intact. Normal muscle tone, no cerebellar symptoms. Psych: Awake and oriented X 3, normal affect, Insight and Judgment appropriate.   EKG sinus bradycardia 48, no ST changes Aorta Scan WNL  Medicare Attestation I have personally reviewed: The patient's medical and social history Their use of alcohol, tobacco or illicit drugs Their current medications and supplements The patient's functional ability including ADLs,fall risks, home safety risks, cognitive, and hearing and visual impairment Diet and physical activities Evidence for depression or mood disorders  The patient's weight, height, BMI, and visual acuity have been recorded in the chart.  I have made referrals, counseling, and provided education to the patient based on review of the above and I have provided the patient with a written personalized care plan for preventive services.    Vicie Mutters, PA-C 2:28 PM [facility]

## 2017-08-10 ENCOUNTER — Encounter: Payer: Self-pay | Admitting: Physician Assistant

## 2017-08-10 ENCOUNTER — Ambulatory Visit (INDEPENDENT_AMBULATORY_CARE_PROVIDER_SITE_OTHER): Payer: PPO | Admitting: Physician Assistant

## 2017-08-10 VITALS — BP 122/70 | HR 64 | Temp 97.9°F | Ht 67.5 in | Wt 222.2 lb

## 2017-08-10 DIAGNOSIS — Z0001 Encounter for general adult medical examination with abnormal findings: Secondary | ICD-10-CM | POA: Diagnosis not present

## 2017-08-10 DIAGNOSIS — I872 Venous insufficiency (chronic) (peripheral): Secondary | ICD-10-CM

## 2017-08-10 DIAGNOSIS — E785 Hyperlipidemia, unspecified: Secondary | ICD-10-CM

## 2017-08-10 DIAGNOSIS — E118 Type 2 diabetes mellitus with unspecified complications: Secondary | ICD-10-CM

## 2017-08-10 DIAGNOSIS — R6889 Other general symptoms and signs: Secondary | ICD-10-CM

## 2017-08-10 DIAGNOSIS — E039 Hypothyroidism, unspecified: Secondary | ICD-10-CM

## 2017-08-10 DIAGNOSIS — Z79899 Other long term (current) drug therapy: Secondary | ICD-10-CM

## 2017-08-10 DIAGNOSIS — Z85828 Personal history of other malignant neoplasm of skin: Secondary | ICD-10-CM | POA: Insufficient documentation

## 2017-08-10 DIAGNOSIS — N182 Chronic kidney disease, stage 2 (mild): Secondary | ICD-10-CM

## 2017-08-10 DIAGNOSIS — K2100 Gastro-esophageal reflux disease with esophagitis, without bleeding: Secondary | ICD-10-CM

## 2017-08-10 DIAGNOSIS — I10 Essential (primary) hypertension: Secondary | ICD-10-CM

## 2017-08-10 DIAGNOSIS — K5730 Diverticulosis of large intestine without perforation or abscess without bleeding: Secondary | ICD-10-CM

## 2017-08-10 DIAGNOSIS — K648 Other hemorrhoids: Secondary | ICD-10-CM

## 2017-08-10 DIAGNOSIS — K219 Gastro-esophageal reflux disease without esophagitis: Secondary | ICD-10-CM

## 2017-08-10 DIAGNOSIS — Z7189 Other specified counseling: Secondary | ICD-10-CM

## 2017-08-10 DIAGNOSIS — Z87891 Personal history of nicotine dependence: Secondary | ICD-10-CM

## 2017-08-10 DIAGNOSIS — R001 Bradycardia, unspecified: Secondary | ICD-10-CM

## 2017-08-10 DIAGNOSIS — G9009 Other idiopathic peripheral autonomic neuropathy: Secondary | ICD-10-CM

## 2017-08-10 DIAGNOSIS — D649 Anemia, unspecified: Secondary | ICD-10-CM

## 2017-08-10 DIAGNOSIS — Z23 Encounter for immunization: Secondary | ICD-10-CM | POA: Diagnosis not present

## 2017-08-10 DIAGNOSIS — Z0000 Encounter for general adult medical examination without abnormal findings: Secondary | ICD-10-CM

## 2017-08-10 HISTORY — DX: Personal history of other malignant neoplasm of skin: Z85.828

## 2017-08-10 MED ORDER — LEVOTHYROXINE SODIUM 125 MCG PO TABS: ORAL_TABLET | ORAL | 1 refills | Status: DC

## 2017-08-10 MED ORDER — DILTIAZEM HCL ER 240 MG PO CP24: 240.0000 mg | ORAL_CAPSULE | Freq: Every day | ORAL | 1 refills | Status: DC

## 2017-08-10 MED ORDER — PANTOPRAZOLE SODIUM 20 MG PO TBEC: 20.0000 mg | DELAYED_RELEASE_TABLET | Freq: Every day | ORAL | 0 refills | Status: DC

## 2017-08-10 MED ORDER — ATORVASTATIN CALCIUM 40 MG PO TABS: ORAL_TABLET | ORAL | 1 refills | Status: DC

## 2017-08-10 MED ORDER — FUROSEMIDE 20 MG PO TABS
20.0000 mg | ORAL_TABLET | Freq: Two times a day (BID) | ORAL | 1 refills | Status: DC
Start: 2017-08-10 — End: 2018-08-06

## 2017-08-10 NOTE — Patient Instructions (Addendum)
[facility]  7 a.m.-6:30 p.m., Monday 7 a.m.-5 p.m., Tuesday-Friday Schedule an appointment by calling [phone].  [facility] Schedule an appointment by calling [phone].  Encourage you to get the 3D Mammogram  The 3D Mammogram is much more specific and sensitive to pick up breast cancer. For women with fibrocystic breast or lumpy breast it can be hard to determine if it is cancer or not but the 3D mammogram is able to tell this difference which cuts back on unneeded additional tests or scary call backs.   - over 40% increase in detection of breast cancer - over 40% reduction in false positives.  - fewer call backs - reduced anxiety - improved outcomes - [place]  PLEASE BRING IN A COPY OF YOUR LIVING WELL/HEALTH CARE POWER OF ATTORNEY  Increase fiber to prevent diverticulitis With a flare do a very bland diet and bowel rest/liquids  Gabapentin is like the lyrica but it can take 1-3 hours to kick in, play with the timing of the dose  topamax cut back to 1/2 pill a day for 1-2 weeks, and then stop  Switching nexium to protonix 20mg   GETTING OFF OF PPI's    Nexium/protonix/prilosec/Omeprazole/Dexilant/Aciphex are called PPI's, they are great at healing your stomach but should only be taken for a short period of time.     Recent studies have shown that taken for a long time they  can increase the risk of osteoporosis (weakening of your bones), pneumonia, low magnesium, restless legs, Cdiff (infection that causes diarrhea), DEMENTIA and most recently kidney damage / disease / insufficiency.     Due to this information we want to try to stop the PPI but if you try to stop it abruptly this can cause rebound acid and worsening symptoms.   So this is how we want you to get off the PPI:  - Start taking the nexium/protonix/prilosec/PPI  every other day with  zantac (ranitidine) 2 x a day for 2-4 weeks - some people stay on this dosage  and can not taper off further. Our main goal is to limit the dosage and amount you are taking so if you need to stay on this dose.   - then decrease the PPI to every 3 days while taking the zantac (ranitidine) 300mg  twice a day the other  days for 2-4  Weeks  - then you can try the zantac (ranitidine) 300mg  once at night or up to 2 x day as needed.  - you can continue on this once at night or stop all together  - Avoid alcohol, spicy foods, NSAIDS (aleve, ibuprofen) at this time. See foods below.   +++++++++++++++++++++++++++++++++++++++++++  Food Choices for Gastroesophageal Reflux Disease  When you have gastroesophageal reflux disease (GERD), the foods you eat and your eating habits are very important. Choosing the right foods can help ease the discomfort of GERD. WHAT GENERAL GUIDELINES DO I NEED TO FOLLOW?  Choose fruits, vegetables, whole grains, low-fat dairy products, and low-fat meat, fish, and poultry.  Limit fats such as oils, salad dressings, butter, nuts, and avocado.  Keep a food diary to identify foods that cause symptoms.  Avoid foods that cause reflux. These may be different for different people.  Eat frequent small meals instead of three large meals each day.  Eat your meals slowly, in a relaxed setting.  Limit fried foods.  Cook foods using methods other than frying.  Avoid drinking alcohol.  Avoid drinking large amounts  of liquids with your meals.  Avoid bending over or lying down until 2-3 hours after eating.   WHAT FOODS ARE NOT RECOMMENDED? The following are some foods and drinks that may worsen your symptoms:  Vegetables Tomatoes. Tomato juice. Tomato and spaghetti sauce. Chili peppers. Onion and garlic. Horseradish. Fruits Oranges, grapefruit, and lemon (fruit and juice). Meats High-fat meats, fish, and poultry. This includes hot dogs, ribs, ham, sausage, salami, and bacon. Dairy Whole milk and chocolate milk. Sour cream. Cream. Butter. Ice  cream. Cream cheese.  Beverages Coffee and tea, with or without caffeine. Carbonated beverages or energy drinks. Condiments Hot sauce. Barbecue sauce.  Sweets/Desserts Chocolate and cocoa. Donuts. Peppermint and spearmint. Fats and Oils High-fat foods, including [place] fries and potato chips. Other Vinegar. Strong spices, such as black pepper, white pepper, red pepper, cayenne, curry powder, cloves, ginger, and chili powder. Nexium/protonix/prilosec are called PPI's, they are great at healing your stomach but should only be taken for a short period of time.   About Cystocele  Overview  The pelvic organs, including the bladder, are normally supported by pelvic floor muscles and ligaments.  When these muscles and ligaments are stretched, weakened or torn, the wall between the bladder and the vagina sags or herniates causing a prolapse, sometimes called a cystocele.  This condition may cause discomfort and problems with emptying the bladder.  It can be present in various stages.  Some people are not aware of the changes.  Others may notice changes at the vaginal opening or a feeling of the bladder dropping outside the body.  Causes of a Cystocele  A cystocele is usually caused by muscle straining or stretching during childbirth.  In addition, cystocele is more common after menopause, because the hormone estrogen helps keep the elastic tissues around the pelvic organs strong.  A cystocele is more likely to occur when levels of estrogen decrease.  Other causes include: heavy lifting, chronic coughing, previous pelvic surgery and obesity.  Symptoms  A bladder that has dropped from its normal position may cause: unwanted urine leakage (stress incontinence), frequent urination or urge to urinate, incomplete emptying of the bladder (not feeling bladder relief after emptying), pain or discomfort in the vagina, pelvis, groin, lower back or lower abdomen and frequent urinary tract infections.  Mild  cases may not cause any symptoms.  Treatment Options  Pelvic floor (Kegel) exercises:  Strength training the muscles in your genital area  Behavioral changes: Treating and preventing constipation, taking time to empty your bladder properly, learning to lift properly and/or avoid heavy lifting when possible, stopping smoking, avoiding weight gain and treating a chronic cough or bronchitis.  A pessary: A vaginal support device is sometimes used to help pelvic support caused by muscle and ligament changes.  Surgery: Surgical repair may be necessary if symptoms cannot be managed with exercise, behavioral changes and a pessary.  Surgery is usually considered for severe cases.   2007, [employer]

## 2017-08-11 LAB — URINALYSIS W MICROSCOPIC + REFLEX CULTURE
BILIRUBIN URINE: NEGATIVE
Bacteria, UA: NONE SEEN /HPF
GLUCOSE, UA: NEGATIVE
Hgb urine dipstick: NEGATIVE
Hyaline Cast: NONE SEEN /LPF
Ketones, ur: NEGATIVE
Leukocyte Esterase: NEGATIVE
Nitrites, Initial: NEGATIVE
Protein, ur: NEGATIVE
RBC / HPF: NONE SEEN /HPF (ref 0–2)
SPECIFIC GRAVITY, URINE: 1.01 (ref 1.001–1.03)
WBC, UA: NONE SEEN /HPF (ref 0–5)
pH: 5.5 (ref 5.0–8.0)

## 2017-08-11 LAB — CBC WITH DIFFERENTIAL/PLATELET
BASOS ABS: 54 {cells}/uL (ref 0–200)
Basophils Relative: 0.5 %
EOS ABS: 259 {cells}/uL (ref 15–500)
EOS PCT: 2.4 %
HCT: 41.7 % (ref 35.0–45.0)
Hemoglobin: 13.8 g/dL (ref 11.7–15.5)
Lymphs Abs: 3272 cells/uL (ref 850–3900)
MCH: 27.4 pg (ref 27.0–33.0)
MCHC: 33.1 g/dL (ref 32.0–36.0)
MCV: 82.9 fL (ref 80.0–100.0)
MONOS PCT: 6.3 %
MPV: 11.2 fL (ref 7.5–12.5)
Neutro Abs: 6534 cells/uL (ref 1500–7800)
Neutrophils Relative %: 60.5 %
PLATELETS: 271 10*3/uL (ref 140–400)
RBC: 5.03 10*6/uL (ref 3.80–5.10)
RDW: 14.4 % (ref 11.0–15.0)
TOTAL LYMPHOCYTE: 30.3 %
WBC mixed population: 680 cells/uL (ref 200–950)
WBC: 10.8 10*3/uL (ref 3.8–10.8)

## 2017-08-11 LAB — BASIC METABOLIC PANEL WITH GFR
BUN/Creatinine Ratio: 20 (calc) (ref 6–22)
BUN: 20 mg/dL (ref 7–25)
CO2: 23 mmol/L (ref 20–32)
Calcium: 9 mg/dL (ref 8.6–10.4)
Chloride: 111 mmol/L — ABNORMAL HIGH (ref 98–110)
Creat: 1.02 mg/dL — ABNORMAL HIGH (ref 0.50–0.99)
GFR, EST AFRICAN AMERICAN: 65 mL/min/{1.73_m2} (ref >=60)
GFR, EST NON AFRICAN AMERICAN: 56 mL/min/{1.73_m2} — AB (ref >=60)
Glucose, Bld: 74 mg/dL (ref 65–99)
POTASSIUM: 4 mmol/L (ref 3.5–5.3)
SODIUM: 142 mmol/L (ref 135–146)

## 2017-08-11 LAB — HEMOGLOBIN A1C
Hgb A1c MFr Bld: 6 % of total Hgb — ABNORMAL HIGH (ref <5.7)
Mean Plasma Glucose: 126 (calc)
eAG (mmol/L): 7 (calc)

## 2017-08-11 LAB — MICROALBUMIN / CREATININE URINE RATIO
Creatinine, Urine: 45 mg/dL (ref 20–275)
Microalb Creat Ratio: 4 mcg/mg creat (ref <30)
Microalb, Ur: 0.2 mg/dL

## 2017-08-11 LAB — TSH: TSH: 0.3 mIU/L — ABNORMAL LOW (ref 0.40–4.50)

## 2017-08-11 LAB — LIPID PANEL
Cholesterol: 155 mg/dL (ref <200)
HDL: 44 mg/dL — AB (ref >50)
LDL Cholesterol (Calc): 84 mg/dL (calc)
NON-HDL CHOLESTEROL (CALC): 111 mg/dL (ref <130)
TRIGLYCERIDES: 174 mg/dL — AB (ref <150)
Total CHOL/HDL Ratio: 3.5 (calc) (ref <5.0)

## 2017-08-11 LAB — HEPATIC FUNCTION PANEL
AG RATIO: 1.6 (calc) (ref 1.0–2.5)
ALT: 17 U/L (ref 6–29)
AST: 14 U/L (ref 10–35)
Albumin: 4 g/dL (ref 3.6–5.1)
Alkaline phosphatase (APISO): 109 U/L (ref 33–130)
Bilirubin, Direct: 0.1 mg/dL (ref 0.0–0.2)
Globulin: 2.5 g/dL (calc) (ref 1.9–3.7)
Indirect Bilirubin: 0.4 mg/dL (calc) (ref 0.2–1.2)
TOTAL PROTEIN: 6.5 g/dL (ref 6.1–8.1)
Total Bilirubin: 0.5 mg/dL (ref 0.2–1.2)

## 2017-08-11 LAB — MAGNESIUM: MAGNESIUM: 2.2 mg/dL (ref 1.5–2.5)

## 2017-08-11 LAB — NO CULTURE INDICATED

## 2017-08-11 NOTE — Progress Notes (Signed)
LVM for pt to return office call for LAB results.

## 2017-08-11 NOTE — Progress Notes (Signed)
Pt aware of lab results & voiced understanding of those results.

## 2017-08-14 ENCOUNTER — Other Ambulatory Visit: Payer: Self-pay | Admitting: Physician Assistant

## 2017-08-14 DIAGNOSIS — I10 Essential (primary) hypertension: Secondary | ICD-10-CM

## 2017-08-14 MED ORDER — DILTIAZEM HCL ER 120 MG PO CP24: 120.0000 mg | ORAL_CAPSULE | Freq: Every day | ORAL | 0 refills | Status: DC

## 2017-08-17 DIAGNOSIS — H40013 Open angle with borderline findings, low risk, bilateral: Secondary | ICD-10-CM | POA: Diagnosis not present

## 2017-08-21 ENCOUNTER — Other Ambulatory Visit: Payer: Self-pay

## 2017-08-21 DIAGNOSIS — I10 Essential (primary) hypertension: Secondary | ICD-10-CM

## 2017-08-21 MED ORDER — DILTIAZEM HCL ER 120 MG PO CP24: 120.0000 mg | ORAL_CAPSULE | Freq: Every day | ORAL | 0 refills | Status: DC

## 2017-10-04 DIAGNOSIS — Z1231 Encounter for screening mammogram for malignant neoplasm of breast: Secondary | ICD-10-CM | POA: Diagnosis not present

## 2017-10-04 LAB — HM MAMMOGRAPHY

## 2017-10-10 ENCOUNTER — Encounter: Payer: Self-pay | Admitting: Physician Assistant

## 2017-10-10 ENCOUNTER — Encounter: Payer: Self-pay | Admitting: *Deleted

## 2017-10-10 ENCOUNTER — Ambulatory Visit: Payer: PPO | Admitting: Physician Assistant

## 2017-10-10 VITALS — BP 124/76 | HR 66 | Temp 97.3°F | Resp 14 | Ht 67.5 in | Wt 220.4 lb

## 2017-10-10 DIAGNOSIS — N390 Urinary tract infection, site not specified: Secondary | ICD-10-CM

## 2017-10-10 DIAGNOSIS — M545 Low back pain, unspecified: Secondary | ICD-10-CM

## 2017-10-10 MED ORDER — MELOXICAM 7.5 MG PO TABS: ORAL_TABLET | ORAL | 1 refills | Status: DC

## 2017-10-10 MED ORDER — DEXAMETHASONE 0.5 MG PO TABS: ORAL_TABLET | ORAL | 0 refills | Status: DC

## 2017-10-10 NOTE — Progress Notes (Signed)
Subjective:    Patient ID: [name], female    DOB: [birth year], 71 y.o.   MRN: [id number]  HPI 71 y.o. obese WF presents with lower back pain.  States 3 weeks ago she moved a desk and had acute back pain with that, has been intermittent pain since that time, bilateral lower back, worse with movement, better with rest. Has tried aspercream, salonpaus, advil 1 pill once a day. Some pain shooting up her left back side, bilateral legs, no pain down her legs. Has bilateral neuropathy that is unchanged, has numbness/tingling due to this. She has mobic and flexeril at home that is not helping. She has history of recurrent UTI's.  Patient denies fever, hematuria, incontinence, weakness and saddle anesthesia. No cancer history, no IV drug use history.   Blood pressure 124/76, pulse 66, temperature (!) 97.3 F (36.3 C), resp. rate 14, height 5' 7.5" (1.715 m), weight 220 lb 6.4 oz (100 kg), SpO2 97 %.  Medications Current Outpatient Medications on File Prior to Visit  Medication Sig  . aspirin 81 MG tablet Take 81 mg by mouth daily.  Marland Kitchen atorvastatin (LIPITOR) 40 MG tablet TAKE 1 TABLET AT BEDTIME   FOR CHOLESTEROL  . b complex vitamins tablet Take 1 tablet by mouth daily.  Marland Kitchen dicyclomine (BENTYL) 20 MG tablet Take 1 tablet (20 mg total) by mouth 3 (three) times daily as needed for spasms.  Marland Kitchen diltiazem (DILACOR XR) 120 MG 24 hr capsule Take 1 capsule (120 mg total) by mouth daily.  . furosemide (LASIX) 20 MG tablet Take 1 tablet (20 mg total) by mouth 2 (two) times daily.  Marland Kitchen levothyroxine (SYNTHROID) 125 MCG tablet 1 pill daily  . MEGARED OMEGA-3 KRILL OIL 500 MG CAPS Take 1 capsule by mouth daily.  . Multiple Vitamin (MULTIVITAMIN) tablet Take 1 tablet by mouth daily.  . ondansetron (ZOFRAN) 4 MG tablet Take 1 tablet (4 mg total) by mouth daily as needed for nausea or vomiting.  . pantoprazole (PROTONIX) 20 MG tablet Take 1 tablet (20 mg total) by mouth daily.  . meloxicam (MOBIC) 7.5 MG  tablet TAKE 1 TABLET DAILY WITH   FOOD FOR PAIN (Patient not taking: Reported on 10/10/2017)   No current facility-administered medications on file prior to visit.     Problem list She has Internal hemorrhoids; GERD; Diverticulosis of large intestine; History of tobacco use; Hyperlipidemia; Hypothyroid; Anemia; Diabetes mellitus type 2, controlled, with complications ([place]); Morbid obesity ([place]); Chronic venous insufficiency; CKD (chronic kidney disease) stage 2, GFR 60-89 ml/min; Peripheral autonomic neuropathy of unknown cause; Hypertension; and History of basal cell carcinoma (BCC) on their problem list.   Review of Systems  Constitutional: Negative.   HENT: Negative.   Respiratory: Negative.   Cardiovascular: Negative.   Gastrointestinal: Negative.   Genitourinary: Positive for frequency. Negative for decreased urine volume, dysuria, enuresis, flank pain, genital sores, menstrual problem, pelvic pain, urgency, vaginal bleeding, vaginal discharge and vaginal pain.  Musculoskeletal: Positive for back pain. Negative for arthralgias and gait problem.  Skin: Negative.  Negative for rash.  Neurological: Negative.   Psychiatric/Behavioral: Negative.        Objective:   Physical Exam  Constitutional: She is oriented to person, place, and time. She appears well-developed and well-nourished.  HENT:  Head: Normocephalic and atraumatic.  Eyes: Conjunctivae are normal. Pupils are equal, round, and reactive to light.  Neck: Normal range of motion. Neck supple.  Cardiovascular: Normal rate and regular rhythm.  Pulmonary/Chest: Effort normal and  breath sounds normal.  Abdominal: Soft. Bowel sounds are normal. There is no tenderness.  Musculoskeletal:  Patient is able to ambulate well. Gait is  Antalgic. Straight leg raising with dorsiflexion negative bilaterally for radicular symptoms. Sensory exam in the legs are abnormal bilaterally but this is normal for patient. Knee reflexes are normal  Ankle reflexes are normal Strength is normal and symmetric in arms and legs. There is not SI tenderness to palpation.  There is paraspinal muscle spasm on the left.  There is not midline tenderness.  ROM of spine with  limited in all spheres due to pain.   Lymphadenopathy:    She has no cervical adenopathy.  Neurological: She is alert and oriented to person, place, and time. She has normal reflexes.  Skin: Skin is warm and dry. No rash noted.      Assessment & Plan:    Acute left-sided low back pain without sciatica No radicular symptoms, decadron, then mobic, has flexeril can do PRN If not better ortho for knees/back Exercises Check urine -     meloxicam (MOBIC) 7.5 MG tablet; TAKE 1 TABLET DAILY WITH   FOOD FOR PAIN CAN TAKE A SECOND PILL LATER IN THE DAY IF YOU NEED IT -     dexamethasone (DECADRON) 0.5 MG tablet; take 1 tablet PO BID for 3 days, then take 1 tablet PO for 4 days.  Recurrent UTI -     Urinalysis, Routine w reflex microscopic -     Urine Culture       Future Appointments  Date Time Provider [place]  10/25/2017  2:45 PM Vicie Mutters, PA-C [facility] None  11/09/2017  2:00 PM [facility] NURSE [facility] None  02/08/2018  2:00 PM Vicie Mutters, PA-C [facility] None

## 2017-10-10 NOTE — Patient Instructions (Signed)
Try the exercises and other information in the back care manual, take the decadron and then after that you can take the meloxicam once during the day as needed (avoid taking other NSAIDS like Alleve or Ibuprofen while taking this) and then flexeril if needed at bedtime for muscle spasm. This can be taken up to every 8 hours, but causes sedation, so should not drive or operate heavy machinery while taking this medicine.   Do heat, can do the salonpaus on the left bak  Go to the ER if you have any new weakness in your legs, have trouble controlling your urine or bowels, or have worsening pain.   If you are not better in 1-3 month we will refer you to ortho   Back pain Rehab Ask your health care provider which exercises are safe for you. Do exercises exactly as told by your health care provider and adjust them as directed. It is normal to feel mild stretching, pulling, tightness, or discomfort as you do these exercises, but you should stop right away if you feel sudden pain or your pain gets worse.Do not begin these exercises until told by your health care provider. Stretching and range of motion exercises These exercises warm up your muscles and joints and improve the movement and flexibility of your hips and your back. These exercises also help to relieve pain, numbness, and tingling. Exercise A: Sciatic nerve glide 1. Sit in a chair with your head facing down toward your chest. Place your hands behind your back. Let your shoulders slump forward. 2. Slowly straighten one of your knees while you tilt your head back as if you are looking toward the ceiling. Only straighten your leg as far as you can without making your symptoms worse. 3. Hold for __________ seconds. 4. Slowly return to the starting position. 5. Repeat with your other leg. Repeat __________ times. Complete this exercise __________ times a day. Exercise B: Knee to chest with hip adduction and internal rotation  1. Lie on your back  on a firm surface with both legs straight. 2. Bend one of your knees and move it up toward your chest until you feel a gentle stretch in your lower back and buttock. Then, move your knee toward the shoulder that is on the opposite side from your leg. ? Hold your leg in this position by holding onto the front of your knee. 3. Hold for __________ seconds. 4. Slowly return to the starting position. 5. Repeat with your other leg. Repeat __________ times. Complete this exercise __________ times a day. Exercise C: Prone extension on elbows  1. Lie on your abdomen on a firm surface. A bed may be too soft for this exercise. 2. Prop yourself up on your elbows. 3. Use your arms to help lift your chest up until you feel a gentle stretch in your abdomen and your lower back. ? This will place some of your body weight on your elbows. If this is uncomfortable, try stacking pillows under your chest. ? Your hips should stay down, against the surface that you are lying on. Keep your hip and back muscles relaxed. 4. Hold for __________ seconds. 5. Slowly relax your upper body and return to the starting position. Repeat __________ times. Complete this exercise __________ times a day. Strengthening exercises These exercises build strength and endurance in your back. Endurance is the ability to use your muscles for a long time, even after they get tired. Exercise D: Pelvic tilt 1. Lie on your  back on a firm surface. Bend your knees and keep your feet flat. 2. Tense your abdominal muscles. Tip your pelvis up toward the ceiling and flatten your lower back into the floor. ? To help with this exercise, you may place a small towel under your lower back and try to push your back into the towel. 3. Hold for __________ seconds. 4. Let your muscles relax completely before you repeat this exercise. Repeat __________ times. Complete this exercise __________ times a day. Exercise E: Alternating arm and leg raises  1. Get  on your hands and knees on a firm surface. If you are on a hard floor, you may want to use padding to cushion your knees, such as an exercise mat. 2. Line up your arms and legs. Your hands should be below your shoulders, and your knees should be below your hips. 3. Lift your left leg behind you. At the same time, raise your right arm and straighten it in front of you. ? Do not lift your leg higher than your hip. ? Do not lift your arm higher than your shoulder. ? Keep your abdominal and back muscles tight. ? Keep your hips facing the ground. ? Do not arch your back. ? Keep your balance carefully, and do not hold your breath. 4. Hold for __________ seconds. 5. Slowly return to the starting position and repeat with your right leg and your left arm. Repeat __________ times. Complete this exercise __________ times a day. Posture and body mechanics  Body mechanics refers to the movements and positions of your body while you do your daily activities. Posture is part of body mechanics. Good posture and healthy body mechanics can help to relieve stress in your body's tissues and joints. Good posture means that your spine is in its natural S-curve position (your spine is neutral), your shoulders are pulled back slightly, and your head is not tipped forward. The following are general guidelines for applying improved posture and body mechanics to your everyday activities. Standing   When standing, keep your spine neutral and your feet about hip-width apart. Keep a slight bend in your knees. Your ears, shoulders, and hips should line up.  When you do a task in which you stand in one place for a long time, place one foot up on a stable object that is 2-4 inches (5-10 cm) high, such as a footstool. This helps keep your spine neutral. Sitting   When sitting, keep your spine neutral and keep your feet flat on the floor. Use a footrest, if necessary, and keep your thighs parallel to the floor. Avoid rounding  your shoulders, and avoid tilting your head forward.  When working at a desk or a computer, keep your desk at a height where your hands are slightly lower than your elbows. Slide your chair under your desk so you are close enough to maintain good posture.  When working at a computer, place your monitor at a height where you are looking straight ahead and you do not have to tilt your head forward or downward to look at the screen. Resting   When lying down and resting, avoid positions that are most painful for you.  If you have pain with activities such as sitting, bending, stooping, or squatting (flexion-based activities), lie in a position in which your body does not bend very much. For example, avoid curling up on your side with your arms and knees near your chest (fetal position).  If you have pain with  activities such as standing for a long time or reaching with your arms (extension-based activities), lie with your spine in a neutral position and bend your knees slightly. Try the following positions: ? Lying on your side with a pillow between your knees. ? Lying on your back with a pillow under your knees. Lifting   When lifting objects, keep your feet at least shoulder-width apart and tighten your abdominal muscles.  Bend your knees and hips and keep your spine neutral. It is important to lift using the strength of your legs, not your back. Do not lock your knees straight out.  Always ask for help to lift heavy or awkward objects. This information is not intended to replace advice given to you by your health care provider. Make sure you discuss any questions you have with your health care provider. Document Released: 11/07/2005 Document Revised: 07/14/2016 Document Reviewed: 07/24/2015 Elsevier Interactive Patient Education  [employer].

## 2017-10-11 LAB — URINALYSIS, ROUTINE W REFLEX MICROSCOPIC
BILIRUBIN URINE: NEGATIVE
Glucose, UA: NEGATIVE
Hgb urine dipstick: NEGATIVE
Ketones, ur: NEGATIVE
LEUKOCYTES UA: NEGATIVE
Nitrite: NEGATIVE
PROTEIN: NEGATIVE
Specific Gravity, Urine: 1.019 (ref 1.001–1.03)
pH: 7 (ref 5.0–8.0)

## 2017-10-11 LAB — URINE CULTURE
MICRO NUMBER: 81308972
Result:: NO GROWTH
SPECIMEN QUALITY:: ADEQUATE

## 2017-10-16 NOTE — Progress Notes (Signed)
Pt aware of lab results & voiced understanding of those results.

## 2017-10-16 NOTE — Progress Notes (Signed)
LVM for pt to return office call for LAB results.

## 2017-10-18 ENCOUNTER — Other Ambulatory Visit: Payer: Self-pay

## 2017-10-18 NOTE — Patient Outreach (Signed)
[place] [facility]) Care Management  10/18/2017  [name] Sep 20, 1946 [id number]   [employer] High risk screen- No answer. HIPPA compliant message left.  Thea Silversmith, RN, MSN, [place] Coordinator Cell: [phone]

## 2017-10-19 ENCOUNTER — Other Ambulatory Visit: Payer: Self-pay

## 2017-10-19 NOTE — Patient Outreach (Signed)
[place] [facility]) Care Management  10/19/2017  LACRESHA FUSILIER [birth year] [id number]  [employer] High risk screen- No answer. HIPPA compliant message left.  Thea Silversmith, RN, MSN, [place] Coordinator Cell: [phone]

## 2017-10-24 ENCOUNTER — Other Ambulatory Visit: Payer: Self-pay

## 2017-10-24 NOTE — Patient Outreach (Signed)
[place] [facility]) Care Management  10/24/2017  PHAEDRA COLGATE 1946/01/23 [id number]   [employer] High risk screen. Member denies any questions or concerns. No care management needs identified.  Thea Silversmith, RN, MSN, [place] Coordinator Cell: [phone]

## 2017-10-25 ENCOUNTER — Ambulatory Visit: Payer: Self-pay | Admitting: Physician Assistant

## 2017-11-06 ENCOUNTER — Ambulatory Visit: Payer: Self-pay | Admitting: Physician Assistant

## 2017-11-09 ENCOUNTER — Ambulatory Visit: Payer: Self-pay

## 2017-11-23 ENCOUNTER — Encounter: Payer: Self-pay | Admitting: Physician Assistant

## 2017-11-23 ENCOUNTER — Ambulatory Visit (INDEPENDENT_AMBULATORY_CARE_PROVIDER_SITE_OTHER): Payer: PPO | Admitting: Physician Assistant

## 2017-11-23 VITALS — BP 124/74 | HR 68 | Temp 97.6°F | Resp 16 | Ht 67.5 in | Wt 227.6 lb

## 2017-11-23 DIAGNOSIS — E039 Hypothyroidism, unspecified: Secondary | ICD-10-CM | POA: Diagnosis not present

## 2017-11-23 DIAGNOSIS — K219 Gastro-esophageal reflux disease without esophagitis: Secondary | ICD-10-CM | POA: Diagnosis not present

## 2017-11-23 DIAGNOSIS — Z79899 Other long term (current) drug therapy: Secondary | ICD-10-CM

## 2017-11-23 DIAGNOSIS — E118 Type 2 diabetes mellitus with unspecified complications: Secondary | ICD-10-CM | POA: Diagnosis not present

## 2017-11-23 DIAGNOSIS — I10 Essential (primary) hypertension: Secondary | ICD-10-CM

## 2017-11-23 DIAGNOSIS — E785 Hyperlipidemia, unspecified: Secondary | ICD-10-CM | POA: Diagnosis not present

## 2017-11-23 MED ORDER — DILTIAZEM HCL ER 120 MG PO CP24: 120.0000 mg | ORAL_CAPSULE | Freq: Every day | ORAL | 2 refills | Status: DC

## 2017-11-23 MED ORDER — PANTOPRAZOLE SODIUM 20 MG PO TBEC: 20.0000 mg | DELAYED_RELEASE_TABLET | Freq: Every day | ORAL | 2 refills | Status: DC

## 2017-11-23 MED ORDER — GABAPENTIN 300 MG PO CAPS: 300.0000 mg | ORAL_CAPSULE | Freq: Three times a day (TID) | ORAL | 2 refills | Status: DC

## 2017-11-23 NOTE — Patient Instructions (Addendum)
Gabapentin can take up to 1-3 hours to metabolize into the active form, so play with the dose  Can take 3 pills a day however you would like  Some examples: - 1 breakfast, lunch, bedtime. - 1 at breakfast, 2 at bed time  VENOUS INSUFFICIENCY Our lower leg venous system is not the most reliable, the heart does NOT pump fluid up, there is a valve system.  The muscles of the leg squeeze and the blood moves up and a valve opens and close, then they squeeze, blood moves up and valves open and closes keeping the blood moving towards the heart.  Lots can go wrong with this valve system.  If someone is sitting or standing without movement, everyone will get swelling.  THINGS TO DO:  Do not stand or sit in one position for long periods of time. Do not sit with your legs crossed. Rest with your legs raised during the day.  Your legs have to be higher than your heart so that gravity will force the valves to open, so please really elevate your legs.   Wear elastic stockings or support hose. Do not wear other tight, encircling garments around the legs, pelvis, or waist.  [employer]  has a wide variety of well priced compression stockings. [place], [place] [id number] [id number] [place] has a good cheap selection, I like the women's compression socks, they are not as hard to get on  Walk as much as possible to increase blood flow.  Raise the foot of your bed at night with 2-inch blocks.  SEEK MEDICAL CARE IF:   The skin around your ankle starts to break down.  You have pain, redness, tenderness, or hard swelling developing in your leg over a vein.  You are uncomfortable due to leg pain.  If you ever have shortness of breath with exertion or chest pain go to the ER.  You are dehydrated, please increase your fluids, this is causing your kidney function to be decreased. Being dehydrated can also cause fatigue, headaches, muscle aches, joint pain, and dry skin/nails so  please increase your fluids.     GETTING OFF OF PPI's    Nexium/protonix/prilosec/Omeprazole/Dexilant/Aciphex are called PPI's, they are great at healing your stomach but should only be taken for a short period of time.     Recent studies have shown that taken for a long time they  can increase the risk of osteoporosis (weakening of your bones), pneumonia, low magnesium, restless legs, Cdiff (infection that causes diarrhea), DEMENTIA and most recently kidney damage / disease / insufficiency.     Due to this information we want to try to stop the PPI but if you try to stop it abruptly this can cause rebound acid and worsening symptoms.   So this is how we want you to get off the PPI: Generic is always fine!!  - Start taking the nexium/protonix/prilosec/PPI  every other day with  zantac (ranitidine) OR pepcid famotadine 2 x a day for 2-4 weeks - some people stay on this dosage and can not taper off further. Our main goal is to limit the dosage and amount you are taking so if you need to stay on this dose.   - then decrease the PPI to every 3 days while taking the zantac or pepcid 300mg  twice a day the other  days for 2-4  Weeks  - then you can try the zantac or pepcid 300mg  once at night or  up to 2 x day as needed.  - you can continue on this once at night or stop all together  - Avoid alcohol, spicy foods, NSAIDS (aleve, ibuprofen) at this time. See foods below.   +++++++++++++++++++++++++++++++++++++++++++  Food Choices for Gastroesophageal Reflux Disease  When you have gastroesophageal reflux disease (GERD), the foods you eat and your eating habits are very important. Choosing the right foods can help ease the discomfort of GERD. WHAT GENERAL GUIDELINES DO I NEED TO FOLLOW?  Choose fruits, vegetables, whole grains, low-fat dairy products, and low-fat meat, fish, and poultry.  Limit fats such as oils, salad dressings, butter, nuts, and avocado.  Keep a food diary to identify  foods that cause symptoms.  Avoid foods that cause reflux. These may be different for different people.  Eat frequent small meals instead of three large meals each day.  Eat your meals slowly, in a relaxed setting.  Limit fried foods.  Cook foods using methods other than frying.  Avoid drinking alcohol.  Avoid drinking large amounts of liquids with your meals.  Avoid bending over or lying down until 2-3 hours after eating.   WHAT FOODS ARE NOT RECOMMENDED? The following are some foods and drinks that may worsen your symptoms:  Vegetables Tomatoes. Tomato juice. Tomato and spaghetti sauce. Chili peppers. Onion and garlic. Horseradish. Fruits Oranges, grapefruit, and lemon (fruit and juice). Meats High-fat meats, fish, and poultry. This includes hot dogs, ribs, ham, sausage, salami, and bacon. Dairy Whole milk and chocolate milk. Sour cream. Cream. Butter. Ice cream. Cream cheese.  Beverages Coffee and tea, with or without caffeine. Carbonated beverages or energy drinks. Condiments Hot sauce. Barbecue sauce.  Sweets/Desserts Chocolate and cocoa. Donuts. Peppermint and spearmint. Fats and Oils High-fat foods, including [place] fries and potato chips. Other Vinegar. Strong spices, such as black pepper, white pepper, red pepper, cayenne, curry powder, cloves, ginger, and chili powder.

## 2017-11-23 NOTE — Progress Notes (Signed)
3 month follow up Assessment and Plan:  Essential hypertension - continue medications, DASH diet, exercise and monitor at home. Call if greater than 130/80.  -     CBC with Differential/Platelet -     BASIC METABOLIC PANEL WITH GFR -     Hepatic function panel  Hypothyroidism, unspecified type Hypothyroidism-check TSH level, continue medications the same, reminded to take on an empty stomach 30-72mins before food.  -     levothyroxine (SYNTHROID) 125 MCG tablet; 1 pill daily -     TSH  Controlled type 2 diabetes mellitus with complication, without long-term current use of insulin ([place]) Discussed general issues about diabetes pathophysiology and management., Educational material distributed., Suggested low cholesterol diet., Encouraged aerobic exercise., Discussed foot care., Reminded to get yearly retinal exam. -     Hemoglobin A1c  Hyperlipidemia, unspecified hyperlipidemia type -continue medications, check lipids, decrease fatty foods, increase activity.  -     Lipid panel  Morbid Obesity with co morbidities - long discussion about weight loss, diet, and exercise  CKD (chronic kidney disease) stage 2, GFR 60-89 ml/min Increase fluids, avoid NSAIDS, monitor sugars, will monitor -     BASIC METABOLIC PANEL WITH GFR  Medication management -     Magnesium  Continue diet and meds as discussed. Further disposition pending results of labs. Over 30 minutes of exam, counseling, chart review, and critical decision making was performed  Future Appointments  Date Time Provider [place]  02/08/2018  2:00 PM Vicie Mutters, PA-C [facility] None    HPI 72 y.o. female  presents for 3 month follow up on hypertension, cholesterol, prediabetes, and vitamin D deficiency.   Her blood pressure has been controlled at home, her cardizem dose was cut in half due to bradycardia last visit, has been good, today their BP is BP: 124/74  She does workout. She denies chest pain, shortness of  breath, dizziness.  She is on cholesterol medication and denies myalgias. Her cholesterol is at goal. The cholesterol last visit was:   Lab Results  Component Value Date   CHOL 155 08/10/2017   HDL 44 (L) 08/10/2017   LDLCALC 65 02/27/2017   TRIG 174 (H) 08/10/2017   CHOLHDL 3.5 08/10/2017   She has been working on diet and exercise for Diabetes with diabetic chronic kidney disease, has been in preDM range x 09/10/2015, she is on bASA, she is on ACE/ARB,  she has idiopathic paraesthsias and is on lyrica/elavil and denies paresthesia of the feet, polydipsia, polyuria and visual disturbances. Last A1C was:  Lab Results  Component Value Date   HGBA1C 6.0 (H) 08/10/2017   Lab Results  Component Value Date   GFRNONAA 56 (L) 08/10/2017   Patient is on Vitamin D supplement.   Lab Results  Component Value Date   VD25OH 46 07/26/2016     BMI is Body mass index is 35.12 kg/m., she is working on diet and exercise. Wt Readings from Last 3 Encounters:  11/23/17 227 lb 9.6 oz (103.2 kg)  10/10/17 220 lb 6.4 oz (100 kg)  08/10/17 222 lb 3.2 oz (100.8 kg)   She is on thyroid medication. Her medication was not changed last visit, 1 a day and 1/2 on sunday.   Lab Results  Component Value Date   TSH 0.30 (L) 08/10/2017  .   Current Medications:  Current Outpatient Medications on File Prior to Visit  Medication Sig Dispense Refill  . aspirin 81 MG tablet Take 81 mg by mouth  daily.    . atorvastatin (LIPITOR) 40 MG tablet TAKE 1 TABLET AT BEDTIME   FOR CHOLESTEROL 90 tablet 1  . b complex vitamins tablet Take 1 tablet by mouth daily.    Marland Kitchen dicyclomine (BENTYL) 20 MG tablet Take 1 tablet (20 mg total) by mouth 3 (three) times daily as needed for spasms. 30 tablet 0  . diltiazem (DILACOR XR) 120 MG 24 hr capsule Take 1 capsule (120 mg total) by mouth daily. 90 capsule 0  . furosemide (LASIX) 20 MG tablet Take 1 tablet (20 mg total) by mouth 2 (two) times daily. 180 tablet 1  . levothyroxine  (SYNTHROID) 125 MCG tablet 1 pill daily 90 tablet 1  . MEGARED OMEGA-3 KRILL OIL 500 MG CAPS Take 1 capsule by mouth daily.    . meloxicam (MOBIC) 7.5 MG tablet TAKE 1 TABLET DAILY WITH   FOOD FOR PAIN CAN TAKE A SECOND PILL LATER IN THE DAY IF YOU NEED IT 180 tablet 1  . Multiple Vitamin (MULTIVITAMIN) tablet Take 1 tablet by mouth daily.    . ondansetron (ZOFRAN) 4 MG tablet Take 1 tablet (4 mg total) by mouth daily as needed for nausea or vomiting. 28 tablet 1  . pantoprazole (PROTONIX) 20 MG tablet Take 1 tablet (20 mg total) by mouth daily. 90 tablet 0   No current facility-administered medications on file prior to visit.    Medical History:  Past Medical History:  Diagnosis Date  . Anemia   . Chest tightness   . Fatigue   . GERD (gastroesophageal reflux disease)   . Hyperlipidemia   . Hypothyroid   . Myalgia   . Obesity   . Prediabetes   . Ulcer   . Varicose veins    Allergies Allergies  Allergen Reactions  . Macrobid [Nitrofurantoin Macrocrystal]   . Penicillins     REACTION: rash  . Procaine Hcl     REACTION: difficulty breathing  . Sulfonamide Derivatives     REACTION: hives  . Tetanus Toxoid     REACTION: arm swells   Surgical History: reviewed and unchanged Family History: reviewed and unchanged Social History: reviewed and unchanged   Review of Systems:  Review of Systems  Constitutional: Negative.   HENT: Negative.   Eyes: Negative.   Respiratory: Negative.   Cardiovascular: Negative.   Gastrointestinal: Negative.   Genitourinary: Negative.   Musculoskeletal: Negative.   Skin: Negative.   Neurological: Negative.   Endo/Heme/Allergies: Negative.   Psychiatric/Behavioral: Negative.     Physical Exam: BP 124/74   Pulse 68   Temp 97.6 F (36.4 C)   Resp 16   Ht 5' 7.5" (1.715 m)   Wt 227 lb 9.6 oz (103.2 kg)   SpO2 96%   BMI 35.12 kg/m  Wt Readings from Last 3 Encounters:  11/23/17 227 lb 9.6 oz (103.2 kg)  10/10/17 220 lb 6.4 oz (100  kg)  08/10/17 222 lb 3.2 oz (100.8 kg)   General Appearance: Well nourished, in no apparent distress. Eyes: PERRLA, EOMs, conjunctiva no swelling or erythema Sinuses: No Frontal/maxillary tenderness ENT/Mouth: Ext aud canals clear, TMs without erythema, bulging. No erythema, swelling, or exudate on post pharynx.  Tonsils not swollen or erythematous. Hearing normal.  Neck: Supple, thyroid normal.  Respiratory: Respiratory effort normal, BS equal bilaterally without rales, rhonchi, wheezing or stridor.  Cardio: RRR with no MRGs. Brisk peripheral pulses with mild edema.  Abdomen: Soft, + BS,  Non tender, no guarding, rebound, hernias, masses. Lymphatics: Non  tender without lymphadenopathy.  Musculoskeletal: Full ROM, 5/5 strength, Normal gait Skin:  Warm, dry without rashes, lesions, ecchymosis.  Neuro: Cranial nerves intact. Normal muscle tone, no cerebellar symptoms. Psych: Awake and oriented X 3, normal affect, Insight and Judgment appropriate.   Vicie Mutters, PA-C 2:37 PM [facility]

## 2017-11-24 LAB — HEMOGLOBIN A1C
EAG (MMOL/L): 7 (calc)
Hgb A1c MFr Bld: 6 % of total Hgb — ABNORMAL HIGH (ref <5.7)
MEAN PLASMA GLUCOSE: 126 (calc)

## 2017-11-24 LAB — HEPATIC FUNCTION PANEL
AG RATIO: 1.6 (calc) (ref 1.0–2.5)
ALKALINE PHOSPHATASE (APISO): 112 U/L (ref 33–130)
ALT: 22 U/L (ref 6–29)
AST: 17 U/L (ref 10–35)
Albumin: 4.1 g/dL (ref 3.6–5.1)
BILIRUBIN INDIRECT: 0.5 mg/dL (ref 0.2–1.2)
BILIRUBIN TOTAL: 0.6 mg/dL (ref 0.2–1.2)
Bilirubin, Direct: 0.1 mg/dL (ref 0.0–0.2)
Globulin: 2.5 g/dL (calc) (ref 1.9–3.7)
TOTAL PROTEIN: 6.6 g/dL (ref 6.1–8.1)

## 2017-11-24 LAB — LIPID PANEL
Cholesterol: 155 mg/dL (ref <200)
HDL: 46 mg/dL — AB (ref >50)
LDL Cholesterol (Calc): 81 mg/dL (calc)
NON-HDL CHOLESTEROL (CALC): 109 mg/dL (ref <130)
Total CHOL/HDL Ratio: 3.4 (calc) (ref <5.0)
Triglycerides: 179 mg/dL — ABNORMAL HIGH (ref <150)

## 2017-11-24 LAB — BASIC METABOLIC PANEL WITH GFR
BUN/Creatinine Ratio: 16 (calc) (ref 6–22)
BUN: 17 mg/dL (ref 7–25)
CALCIUM: 9.5 mg/dL (ref 8.6–10.4)
CHLORIDE: 105 mmol/L (ref 98–110)
CO2: 29 mmol/L (ref 20–32)
Creat: 1.07 mg/dL — ABNORMAL HIGH (ref 0.50–0.99)
GFR, EST AFRICAN AMERICAN: 61 mL/min/{1.73_m2} (ref >=60)
GFR, Est Non African American: 53 mL/min/{1.73_m2} — ABNORMAL LOW (ref >=60)
GLUCOSE: 84 mg/dL (ref 65–99)
POTASSIUM: 4.8 mmol/L (ref 3.5–5.3)
Sodium: 140 mmol/L (ref 135–146)

## 2017-11-24 LAB — CBC WITH DIFFERENTIAL/PLATELET
BASOS PCT: 0.8 %
Basophils Absolute: 90 cells/uL (ref 0–200)
EOS PCT: 2.1 %
Eosinophils Absolute: 235 cells/uL (ref 15–500)
HCT: 45 % (ref 35.0–45.0)
Hemoglobin: 15 g/dL (ref 11.7–15.5)
Lymphs Abs: 3293 cells/uL (ref 850–3900)
MCH: 27.5 pg (ref 27.0–33.0)
MCHC: 33.3 g/dL (ref 32.0–36.0)
MCV: 82.4 fL (ref 80.0–100.0)
MONOS PCT: 7 %
MPV: 11 fL (ref 7.5–12.5)
NEUTROS PCT: 60.7 %
Neutro Abs: 6798 cells/uL (ref 1500–7800)
PLATELETS: 310 10*3/uL (ref 140–400)
RBC: 5.46 10*6/uL — AB (ref 3.80–5.10)
RDW: 13.5 % (ref 11.0–15.0)
TOTAL LYMPHOCYTE: 29.4 %
WBC: 11.2 10*3/uL — AB (ref 3.8–10.8)
WBCMIX: 784 {cells}/uL (ref 200–950)

## 2017-11-24 LAB — TSH: TSH: 0.3 mIU/L — ABNORMAL LOW (ref 0.40–4.50)

## 2017-11-28 DIAGNOSIS — H524 Presbyopia: Secondary | ICD-10-CM | POA: Diagnosis not present

## 2017-12-26 ENCOUNTER — Ambulatory Visit (INDEPENDENT_AMBULATORY_CARE_PROVIDER_SITE_OTHER): Payer: PPO | Admitting: *Deleted

## 2017-12-26 DIAGNOSIS — E039 Hypothyroidism, unspecified: Secondary | ICD-10-CM

## 2017-12-26 LAB — TSH: TSH: 1.33 m[IU]/L (ref 0.40–4.50)

## 2017-12-26 NOTE — Patient Instructions (Addendum)
Patient here for NV to recheck her TSH.  Patient is taking Levothyroxine 125 mcg 1 tablet 4 days a week, is not taking Levothyroxine at all on Tuesdays and Thursdays. She states she did not understand she should be taking 1/2 tablet on these days.  She complained that her hair is falling out.

## 2017-12-28 ENCOUNTER — Other Ambulatory Visit: Payer: Self-pay | Admitting: *Deleted

## 2017-12-28 MED ORDER — LEVOTHYROXINE SODIUM 75 MCG PO TABS: 75.0000 ug | ORAL_TABLET | Freq: Every day | ORAL | 1 refills | Status: DC

## 2018-01-25 ENCOUNTER — Ambulatory Visit (INDEPENDENT_AMBULATORY_CARE_PROVIDER_SITE_OTHER): Payer: PPO | Admitting: *Deleted

## 2018-01-25 DIAGNOSIS — E039 Hypothyroidism, unspecified: Secondary | ICD-10-CM

## 2018-01-25 NOTE — Progress Notes (Signed)
Patient here for a TSH for hypothyroidism.  She is currently taking 75 mcg daily and reports her hair loss has decreased from one month agio.

## 2018-01-26 LAB — TSH: TSH: 7.37 m[IU]/L — AB (ref 0.40–4.50)

## 2018-01-29 DIAGNOSIS — L728 Other follicular cysts of the skin and subcutaneous tissue: Secondary | ICD-10-CM | POA: Diagnosis not present

## 2018-01-29 DIAGNOSIS — Z85828 Personal history of other malignant neoplasm of skin: Secondary | ICD-10-CM | POA: Diagnosis not present

## 2018-01-29 DIAGNOSIS — L738 Other specified follicular disorders: Secondary | ICD-10-CM | POA: Diagnosis not present

## 2018-01-29 DIAGNOSIS — L821 Other seborrheic keratosis: Secondary | ICD-10-CM | POA: Diagnosis not present

## 2018-01-29 DIAGNOSIS — D225 Melanocytic nevi of trunk: Secondary | ICD-10-CM | POA: Diagnosis not present

## 2018-02-08 ENCOUNTER — Encounter: Payer: Self-pay | Admitting: Physician Assistant

## 2018-02-15 ENCOUNTER — Other Ambulatory Visit: Payer: Self-pay | Admitting: Physician Assistant

## 2018-02-15 DIAGNOSIS — E785 Hyperlipidemia, unspecified: Secondary | ICD-10-CM

## 2018-03-01 DIAGNOSIS — H04123 Dry eye syndrome of bilateral lacrimal glands: Secondary | ICD-10-CM | POA: Diagnosis not present

## 2018-03-05 ENCOUNTER — Encounter: Payer: Self-pay | Admitting: Physician Assistant

## 2018-03-08 ENCOUNTER — Ambulatory Visit (INDEPENDENT_AMBULATORY_CARE_PROVIDER_SITE_OTHER): Payer: PPO

## 2018-03-08 DIAGNOSIS — E039 Hypothyroidism, unspecified: Secondary | ICD-10-CM

## 2018-03-08 LAB — TSH: TSH: 7.06 mIU/L — ABNORMAL HIGH (ref 0.40–4.50)

## 2018-03-08 NOTE — Progress Notes (Signed)
Patient presents to the office for a nurse visit to have her TSH drawn. Patient states that she is taking her thyroid medication one hour prior to food, and not within 4 hours of taking her calcium, magnesium and any stomach medications.

## 2018-03-14 ENCOUNTER — Other Ambulatory Visit: Payer: Self-pay | Admitting: *Deleted

## 2018-03-14 MED ORDER — LEVOTHYROXINE SODIUM 75 MCG PO TABS: ORAL_TABLET | ORAL | 1 refills | Status: DC

## 2018-03-26 ENCOUNTER — Encounter: Payer: Self-pay | Admitting: Physician Assistant

## 2018-03-26 NOTE — Progress Notes (Signed)
MEDICARE  Assessment and Plan:  Essential hypertension - continue medications, DASH diet, exercise and monitor at home. Call if greater than 130/80.  -     CBC with Differential/Platelet -     BASIC METABOLIC PANEL WITH GFR -     Hepatic function panel  Hypothyroidism, unspecified type Hypothyroidism-check TSH level, continue medications the same, reminded to take on an empty stomach 30-38mins before food.  Discussed armour thyroid and I informed her why it is not a good medication -     levothyroxine (SYNTHROID) 125 MCG tablet; 1 pill daily  Controlled type 2 diabetes mellitus with complication, without long-term current use of insulin ([place]) Discussed general issues about diabetes pathophysiology and management., Educational material distributed., Suggested low cholesterol diet., Encouraged aerobic exercise., Discussed foot care., Reminded to get yearly retinal exam- will request info -     Hemoglobin A1c  Hyperlipidemia, unspecified hyperlipidemia type -continue medications, check lipids, decrease fatty foods, increase activity.  -     atorvastatin (LIPITOR) 40 MG tablet; TAKE 1 TABLET AT BEDTIME   FOR CHOLESTEROL -     Lipid panel  Morbid Obesity with co morbidities - long discussion about weight loss, diet, and exercise  Anemia, unspecified type - monitor, continue iron supp with Vitamin C and increase green leafy veggies -     CBC with Differential/Platelet  Peripheral autonomic neuropathy of unknown cause May need to switch lyrica to gabaptentin 800mg  TID, if she does well with it than we will send it into the mail order  CKD (chronic kidney disease) stage 2, GFR 60-89 ml/min Increase fluids, avoid NSAIDS, monitor sugars, will monitor -     BASIC METABOLIC PANEL WITH GFR  Chronic venous insufficiency Compression stockings given, continue lasix weight loss advised  Gastroesophageal reflux disease with esophagitis Try to decrease nexium40  to protonix 20, and given  taper/diet  Diverticulosis of large intestine without hemorrhage Increase fiber, no symptoms at this itme  History of tobacco use Q 2014  Encounter for Medicare annual wellness exam 1 year  History of basal cell carcinoma (BCC) Continue follow up DERM  Medication management -     Magnesium  Blue toe syndrome, bilateral ([place])- want to rule out With significant history for DM, p. Neuropathy, HTN, chol, 15 year smoking history Q 5 years ago will set up for evaluation with vascular for possible CTA/echo or looking for source emboli if they feel that it is needed If any SOB, CP, worsening symptoms go to ER Go up to 325mg  ASA Please see pictures below for reference  Pain due to onychomycosis of toenails of both feet -     Ambulatory referral to Podiatry - will refer for nails and evaluation  Chronic pain of both knees -     Ambulatory referral to Orthopedics - weight loss advised May benefit from injections  Continue diet and meds as discussed. Further disposition pending results of labs. Over 30 minutes of exam, counseling, chart review, and critical decision making was performed  Future Appointments  Date Time Provider [place]  04/12/2018  2:30 PM [facility] None  08/14/2018 10:00 AM Vicie Mutters, PA-C [facility] None   During the course of the visit the patient was educated and counseled about appropriate screening and preventive services including:    Pneumococcal vaccine   Prevnar 13  Influenza vaccine  Td vaccine  Screening electrocardiogram  Bone densitometry screening  Colorectal cancer screening  Diabetes screening  Glaucoma screening  Nutrition counseling  Advanced directives: requested  HPI [name]  presents for welcome to medicare wellness and 3 month follow up on hypertension, cholesterol, prediabetes, and vitamin D deficiency.   She has been having bilateral foot pain, she has bad nails, she has started to  have blackening of the skin of her left 2nd toe at the tip and a place on her right medial big toe. She has HTN, venous insuff, DM 2, CKD 2, obesity, chol, Q smoking 5 years ago but had 15 smoking history prior, neuropathy up to her mid shin.   Her blood pressure has been controlled at home, has been good, today their BP is BP: 120/76  She does workout. She denies chest pain, shortness of breath, dizziness. Has been having some knee pain, will use aspercream and aleve occ that helps.   She is on cholesterol medication and denies myalgias. Her cholesterol is at goal. The cholesterol last visit was:   Lab Results  Component Value Date   CHOL 155 11/23/2017   HDL 46 (L) 11/23/2017   LDLCALC 81 11/23/2017   TRIG 179 (H) 11/23/2017   CHOLHDL 3.4 11/23/2017   She has been working on diet and exercise for Diabetes with diabetic chronic kidney disease, she is on bASA, she is on ACE/ARB, she is off estrogen now, she has idiopathic paraesthsias and lyrica works better 150mg  total but she is on gabapentin only 900 a day total and denies paresthesia of the feet, polydipsia, polyuria and visual disturbances. Last A1C was:  Lab Results  Component Value Date   HGBA1C 6.0 (H) 11/23/2017   Patient is on Vitamin D supplement.   Lab Results  Component Value Date   VD25OH 46 07/26/2016     BMI is Body mass index is 36.66 kg/m., she is working on diet and exercise. Wt Readings from Last 3 Encounters:  03/28/18 237 lb 9.6 oz (107.8 kg)  11/23/17 227 lb 9.6 oz (103.2 kg)  10/10/17 220 lb 6.4 oz (100 kg)   She is on thyroid medication. Her medication was changed last visit, she is on 1.5 4 days a week and 1 3 days a week, empty stomach 1 hour before food.   Lab Results  Component Value Date   TSH 7.06 (H) 03/08/2018  .   Current Medications:   Current Outpatient Medications (Endocrine & Metabolic):  .  levothyroxine (SYNTHROID, LEVOTHROID) 75 MCG tablet, Take 1 tablet 3 days a week and 1.5 tablets 4  days a week or as directed.  Current Outpatient Medications (Cardiovascular):  .  atorvastatin (LIPITOR) 40 MG tablet, Take 1 tablet by mouth at bedtime for cholesterol .  diltiazem (DILACOR XR) 120 MG 24 hr capsule, Take 1 capsule (120 mg total) by mouth daily. .  furosemide (LASIX) 20 MG tablet, Take 1 tablet (20 mg total) by mouth 2 (two) times daily.   Current Outpatient Medications (Analgesics):  .  aspirin 81 MG tablet, Take 81 mg by mouth daily. .  meloxicam (MOBIC) 7.5 MG tablet, TAKE 1 TABLET DAILY WITH   FOOD FOR PAIN CAN TAKE A SECOND PILL LATER IN THE DAY IF YOU NEED IT   Current Outpatient Medications (Other):  .  b complex vitamins tablet, Take 1 tablet by mouth daily. Marland Kitchen  MEGARED OMEGA-3 KRILL OIL 500 MG CAPS, Take 1 capsule by mouth daily. .  Multiple Vitamin (MULTIVITAMIN) tablet, Take 1 tablet by mouth daily. .  pantoprazole (PROTONIX) 20 MG tablet, Take 1 tablet (20 mg total) by  mouth daily. Marland Kitchen  dicyclomine (BENTYL) 20 MG tablet, Take 1 tablet (20 mg total) by mouth 3 (three) times daily as needed for spasms. Marland Kitchen  gabapentin (NEURONTIN) 800 MG tablet, Take 1 tablet (800 mg total) by mouth 3 (three) times daily.    Medical History:  Past Medical History:  Diagnosis Date  . Anemia   . Chest tightness   . Fatigue   . GERD (gastroesophageal reflux disease)   . Hyperlipidemia   . Hypothyroid   . Internal hemorrhoids 05/04/2009   Qualifier: Diagnosis of  By: Nelson-Smith CMA (AAMA), Dottie    . Myalgia   . Obesity   . Prediabetes   . Ulcer   . Varicose veins    Health Maintenance:   Immunization History  Administered Date(s) Administered  . Influenza, High Dose Seasonal PF 08/19/2014, 09/10/2015, 07/26/2016, 08/10/2017  . Pneumococcal Conjugate-13 05/12/2014  . Pneumococcal Polysaccharide-23 09/05/2007, 06/02/2015    Tetanus: ALLERGY Pneumovax: 2016 Prevnar 2015 Flu vaccine: 2018  Zostavax: N/A will check with insurance  Pap: 02/2011 normal declines  another MGM: 09/2017 DEXA: 07/2012 normal Colonoscopy: 2011  Due 2021 EGD: n/a CT AB 2011 Heart cath June 1999, no blockages, coronary spasms  [facility]  Dr. Nicki Reaper for eye care, April 2019 Patient Care Team: Unk Pinto, MD as PCP - General (Internal Medicine) Lafayette Dragon, MD (Inactive) as Consulting Physician (Gastroenterology) Darleen Crocker, MD as Consulting Physician (Ophthalmology) Macarthur Critchley, [place] as Referring Physician (Optometry) Crista Luria, MD as Consulting Physician (Dermatology) Ninetta Lights, MD as Consulting Physician (Orthopedic Surgery) Minus Breeding, MD as Consulting Physician (Cardiology)  Allergies Allergies  Allergen Reactions  . Macrobid [Nitrofurantoin Macrocrystal]   . Penicillins     REACTION: rash  . Procaine Hcl     REACTION: difficulty breathing  . Sulfonamide Derivatives     REACTION: hives  . Tetanus Toxoid     REACTION: arm swells    SURGICAL HISTORY She  has a past surgical history that includes Abdominal hysterectomy; Tonsillectomy and adenoidectomy; Cataract extraction; Nasal sinus surgery; and Rotator cuff repair (Left, more than 7 years ago). FAMILY HISTORY Her family history includes Coronary artery disease (age of onset: 77) in her father. SOCIAL HISTORY She  reports that she quit smoking about 4 years ago. Her smoking use included cigarettes. She has a 23.00 pack-year smoking history. She has never used smokeless tobacco.  MEDICARE WELLNESS OBJECTIVES: Physical activity: Current Exercise Habits: The patient does not participate in regular exercise at present Cardiac risk factors: Cardiac Risk Factors include: advanced age (>47men, >67 women);dyslipidemia;diabetes mellitus;hypertension;obesity (BMI >30kg/m2);sedentary lifestyle Depression/mood screen:   Depression screen [facility] 2/9 03/28/2018  Decreased Interest 0  Down, Depressed, Hopeless 0  PHQ - 2 Score 0    ADLs:  In your present state of  health, do you have any difficulty performing the following activities: 03/28/2018 08/10/2017  Hearing? N N  Vision? N N  Difficulty concentrating or making decisions? N N  Walking or climbing stairs? N N  Dressing or bathing? N N  Doing errands, shopping? N N  In the past six months, have you accidently leaked urine? - N  Do you have problems with loss of bowel control? - N  Some recent data might be hidden     Cognitive Testing  Alert? Yes  Normal Appearance?Yes  Oriented to person? Yes  Place? Yes   Time? Yes  Recall of three objects?  Yes  Can perform simple calculations? Yes  Displays  appropriate judgment?Yes  Can read the correct time from a watch face?Yes  EOL planning: Does Patient Have a Medical Advance Directive?: Yes Type of Advance Directive: [place], Living will [place] in Chart?: No - copy requested   Physical Exam: BP 120/76   Pulse 74   Temp 97.6 F (36.4 C)   Resp 14   Ht 5' 7.5" (1.715 m)   Wt 237 lb 9.6 oz (107.8 kg)   SpO2 98%   BMI 36.66 kg/m  Wt Readings from Last 3 Encounters:  03/28/18 237 lb 9.6 oz (107.8 kg)  11/23/17 227 lb 9.6 oz (103.2 kg)  10/10/17 220 lb 6.4 oz (100 kg)   General Appearance: Well nourished, in no apparent distress. Eyes: PERRLA, EOMs, conjunctiva no swelling or erythema Sinuses: No Frontal/maxillary tenderness ENT/Mouth: Ext aud canals clear, TMs without erythema, bulging. No erythema, swelling, or exudate on post pharynx.  Tonsils not swollen or erythematous. Hearing normal.  Neck: Supple, thyroid normal.  Respiratory: Respiratory effort normal, BS equal bilaterally without rales, rhonchi, wheezing or stridor.  Cardio: RRR with no MRGs. Brisk pulses bilateral TP/DP with bilateral 1+ edema and venous stasis dermatitis.  Abdomen: Soft, + BS,  Non tender, no guarding, rebound, hernias, masses. Lymphatics: Non tender without lymphadenopathy.  Musculoskeletal: Full ROM, 5/5  strength, Normal gait Skin: 2nd left toenail with black nontender tip with scaly and decrease pulses at the toe, right 1st medial toe with distal black area with surrounding dry scaly skin. Warm, dry without rashes, lesions, ecchymosis. Please see pictures for reference.  Neuro: Cranial nerves intact. Normal muscle tone, no cerebellar symptoms. Psych: Awake and oriented X 3, normal affect, Insight and Judgment appropriate.         Medicare Attestation I have personally reviewed: The patient's medical and social history Their use of alcohol, tobacco or illicit drugs Their current medications and supplements The patient's functional ability including ADLs,fall risks, home safety risks, cognitive, and hearing and visual impairment Diet and physical activities Evidence for depression or mood disorders  The patient's weight, height, BMI, and visual acuity have been recorded in the chart.  I have made referrals, counseling, and provided education to the patient based on review of the above and I have provided the patient with a written personalized care plan for preventive services.    Vicie Mutters, PA-C 4:33 PM [facility]

## 2018-03-28 ENCOUNTER — Ambulatory Visit (INDEPENDENT_AMBULATORY_CARE_PROVIDER_SITE_OTHER): Payer: PPO | Admitting: Physician Assistant

## 2018-03-28 ENCOUNTER — Encounter: Payer: Self-pay | Admitting: Physician Assistant

## 2018-03-28 VITALS — BP 120/76 | HR 74 | Temp 97.6°F | Resp 14 | Ht 67.5 in | Wt 237.6 lb

## 2018-03-28 DIAGNOSIS — N182 Chronic kidney disease, stage 2 (mild): Secondary | ICD-10-CM | POA: Diagnosis not present

## 2018-03-28 DIAGNOSIS — Z85828 Personal history of other malignant neoplasm of skin: Secondary | ICD-10-CM | POA: Diagnosis not present

## 2018-03-28 DIAGNOSIS — K5730 Diverticulosis of large intestine without perforation or abscess without bleeding: Secondary | ICD-10-CM | POA: Diagnosis not present

## 2018-03-28 DIAGNOSIS — D649 Anemia, unspecified: Secondary | ICD-10-CM | POA: Diagnosis not present

## 2018-03-28 DIAGNOSIS — K2100 Gastro-esophageal reflux disease with esophagitis, without bleeding: Secondary | ICD-10-CM

## 2018-03-28 DIAGNOSIS — E785 Hyperlipidemia, unspecified: Secondary | ICD-10-CM

## 2018-03-28 DIAGNOSIS — Z0001 Encounter for general adult medical examination with abnormal findings: Secondary | ICD-10-CM | POA: Diagnosis not present

## 2018-03-28 DIAGNOSIS — Z87891 Personal history of nicotine dependence: Secondary | ICD-10-CM

## 2018-03-28 DIAGNOSIS — I872 Venous insufficiency (chronic) (peripheral): Secondary | ICD-10-CM

## 2018-03-28 DIAGNOSIS — E118 Type 2 diabetes mellitus with unspecified complications: Secondary | ICD-10-CM | POA: Diagnosis not present

## 2018-03-28 DIAGNOSIS — M25562 Pain in left knee: Secondary | ICD-10-CM

## 2018-03-28 DIAGNOSIS — G9009 Other idiopathic peripheral autonomic neuropathy: Secondary | ICD-10-CM

## 2018-03-28 DIAGNOSIS — Z6836 Body mass index (BMI) 36.0-36.9, adult: Secondary | ICD-10-CM

## 2018-03-28 DIAGNOSIS — B351 Tinea unguium: Secondary | ICD-10-CM

## 2018-03-28 DIAGNOSIS — E039 Hypothyroidism, unspecified: Secondary | ICD-10-CM | POA: Diagnosis not present

## 2018-03-28 DIAGNOSIS — Z0000 Encounter for general adult medical examination without abnormal findings: Secondary | ICD-10-CM

## 2018-03-28 DIAGNOSIS — I10 Essential (primary) hypertension: Secondary | ICD-10-CM

## 2018-03-28 DIAGNOSIS — Z79899 Other long term (current) drug therapy: Secondary | ICD-10-CM

## 2018-03-28 DIAGNOSIS — M79675 Pain in left toe(s): Secondary | ICD-10-CM

## 2018-03-28 DIAGNOSIS — I75023 Atheroembolism of bilateral lower extremities: Secondary | ICD-10-CM

## 2018-03-28 DIAGNOSIS — R6889 Other general symptoms and signs: Secondary | ICD-10-CM | POA: Diagnosis not present

## 2018-03-28 DIAGNOSIS — M79674 Pain in right toe(s): Secondary | ICD-10-CM

## 2018-03-28 DIAGNOSIS — G8929 Other chronic pain: Secondary | ICD-10-CM

## 2018-03-28 DIAGNOSIS — M25561 Pain in right knee: Secondary | ICD-10-CM

## 2018-03-28 MED ORDER — GABAPENTIN 800 MG PO TABS: 800.0000 mg | ORAL_TABLET | Freq: Three times a day (TID) | ORAL | 3 refills | Status: DC

## 2018-03-28 NOTE — Patient Instructions (Addendum)
Why we do not prescribe Armour Thyroid: 1) Armour is purified porcine (pig) thyroid glands, which is NOT without risk for contaminants.  2) The ratio between T3 and T4 in Armour thyroid is physiologic for pigs, NOT for humans.  3) The short half life of T3 can cause fluctuations in blood levels, which can result in mood swings and heart rhythm abnormalities.  4) the concentration of the active substances (T4 and T3) can be expected to vary between different Armour lots, which can cause variation in the thyroid function tests.   Thyroid level is not in range. Please make sure you are taking your thyroid medication 60 mins before food with just water. Do not take it within 4 hours of calcium, magnesium, stomach medications like tums, zantac, prilosec for example. Recheck lab only in one month.   Hypothyroidism can cause slow metabolism, fatigue, weight gain, dry hair/skin, constipation, swelling, decreased memory/brain fog.   Stay on aspirin Go to the ER if any chest pain, shortness of breath, nausea, dizziness, severe HA, changes vision/speech   Check out  Mini habits for weight loss book  2 apps for tracking food is myfitness pal  loseit OR can take picture of your food     When it comes to diets, agreement about the perfect plan isn't easy to find, even among the experts. Experts at the [employer] developed an idea known as the Healthy Eating Plate. Just imagine a plate divided into logical, healthy portions.  The emphasis is on diet quality:  Load up on vegetables and fruits - one-half of your plate: Aim for color and variety, and remember that potatoes don't count.  Go for whole grains - one-quarter of your plate: Whole wheat, barley, wheat berries, quinoa, oats, brown rice, and foods made with them. If you want pasta, go with whole wheat pasta.  Protein power - one-quarter of your plate: Fish, chicken, beans, and nuts are all healthy, versatile protein  sources. Limit red meat.  The diet, however, does go beyond the plate, offering a few other suggestions.  Use healthy plant oils, such as olive, canola, soy, corn, sunflower and peanut. Check the labels, and avoid partially hydrogenated oil, which have unhealthy trans fats.  If you're thirsty, drink water. Coffee and tea are good in moderation, but skip sugary drinks and limit milk and dairy products to one or two daily servings.  The type of carbohydrate in the diet is more important than the amount. Some sources of carbohydrates, such as vegetables, fruits, whole grains, and beans-are healthier than others.  Finally, stay active.

## 2018-03-29 ENCOUNTER — Other Ambulatory Visit: Payer: Self-pay

## 2018-03-29 DIAGNOSIS — I75023 Atheroembolism of bilateral lower extremities: Secondary | ICD-10-CM

## 2018-03-29 LAB — COMPLETE METABOLIC PANEL WITH GFR
AG Ratio: 1.6 (calc) (ref 1.0–2.5)
ALBUMIN MSPROF: 4.2 g/dL (ref 3.6–5.1)
ALKALINE PHOSPHATASE (APISO): 106 U/L (ref 33–130)
ALT: 26 U/L (ref 6–29)
AST: 22 U/L (ref 10–35)
BUN/Creatinine Ratio: 14 (calc) (ref 6–22)
BUN: 17 mg/dL (ref 7–25)
CO2: 28 mmol/L (ref 20–32)
CREATININE: 1.2 mg/dL — AB (ref 0.60–0.93)
Calcium: 9.4 mg/dL (ref 8.6–10.4)
Chloride: 108 mmol/L (ref 98–110)
GFR, Est African American: 53 mL/min/{1.73_m2} — ABNORMAL LOW (ref >=60)
GFR, Est Non African American: 46 mL/min/{1.73_m2} — ABNORMAL LOW (ref >=60)
GLUCOSE: 68 mg/dL (ref 65–99)
Globulin: 2.7 g/dL (calc) (ref 1.9–3.7)
Potassium: 4.7 mmol/L (ref 3.5–5.3)
Sodium: 143 mmol/L (ref 135–146)
Total Bilirubin: 0.8 mg/dL (ref 0.2–1.2)
Total Protein: 6.9 g/dL (ref 6.1–8.1)

## 2018-03-29 LAB — CBC WITH DIFFERENTIAL/PLATELET
BASOS ABS: 76 {cells}/uL (ref 0–200)
Basophils Relative: 0.6 %
EOS PCT: 1.7 %
Eosinophils Absolute: 214 cells/uL (ref 15–500)
HCT: 42.6 % (ref 35.0–45.0)
HEMOGLOBIN: 14.3 g/dL (ref 11.7–15.5)
LYMPHS ABS: 4032 {cells}/uL — AB (ref 850–3900)
MCH: 28.3 pg (ref 27.0–33.0)
MCHC: 33.6 g/dL (ref 32.0–36.0)
MCV: 84.2 fL (ref 80.0–100.0)
MONOS PCT: 8 %
MPV: 11.1 fL (ref 7.5–12.5)
Neutro Abs: 7270 cells/uL (ref 1500–7800)
Neutrophils Relative %: 57.7 %
Platelets: 302 10*3/uL (ref 140–400)
RBC: 5.06 10*6/uL (ref 3.80–5.10)
RDW: 14.3 % (ref 11.0–15.0)
Total Lymphocyte: 32 %
WBC mixed population: 1008 cells/uL — ABNORMAL HIGH (ref 200–950)
WBC: 12.6 10*3/uL — AB (ref 3.8–10.8)

## 2018-03-29 LAB — LIPID PANEL
CHOL/HDL RATIO: 3.4 (calc) (ref <5.0)
Cholesterol: 145 mg/dL (ref <200)
HDL: 43 mg/dL — AB (ref >50)
LDL CHOLESTEROL (CALC): 71 mg/dL
Non-HDL Cholesterol (Calc): 102 mg/dL (calc) (ref <130)
TRIGLYCERIDES: 223 mg/dL — AB (ref <150)

## 2018-03-29 LAB — HEMOGLOBIN A1C
Hgb A1c MFr Bld: 6.1 % of total Hgb — ABNORMAL HIGH (ref <5.7)
Mean Plasma Glucose: 128 (calc)
eAG (mmol/L): 7.1 (calc)

## 2018-03-29 LAB — MAGNESIUM: Magnesium: 2.4 mg/dL (ref 1.5–2.5)

## 2018-04-02 ENCOUNTER — Encounter: Payer: Self-pay | Admitting: Surgery

## 2018-04-02 ENCOUNTER — Ambulatory Visit (HOSPITAL_COMMUNITY)
Admission: RE | Admit: 2018-04-02 | Discharge: 2018-04-02 | Disposition: A | Discharge status: Home / Self Care | Payer: PPO | Source: Ambulatory Visit | Attending: Surgery | Admitting: Surgery

## 2018-04-02 ENCOUNTER — Ambulatory Visit: Payer: PPO | Admitting: Surgery

## 2018-04-02 ENCOUNTER — Other Ambulatory Visit: Payer: Self-pay

## 2018-04-02 VITALS — BP 122/70 | HR 98 | Temp 97.9°F | Resp 20 | Ht 67.5 in | Wt 241.0 lb

## 2018-04-02 DIAGNOSIS — E118 Type 2 diabetes mellitus with unspecified complications: Secondary | ICD-10-CM | POA: Diagnosis not present

## 2018-04-02 DIAGNOSIS — R0989 Other specified symptoms and signs involving the circulatory and respiratory systems: Secondary | ICD-10-CM | POA: Insufficient documentation

## 2018-04-02 DIAGNOSIS — I75023 Atheroembolism of bilateral lower extremities: Secondary | ICD-10-CM

## 2018-04-02 DIAGNOSIS — I96 Gangrene, not elsewhere classified: Secondary | ICD-10-CM | POA: Diagnosis not present

## 2018-04-02 DIAGNOSIS — Z01812 Encounter for preprocedural laboratory examination: Secondary | ICD-10-CM

## 2018-04-02 DIAGNOSIS — R9389 Abnormal findings on diagnostic imaging of other specified body structures: Secondary | ICD-10-CM | POA: Insufficient documentation

## 2018-04-02 NOTE — Progress Notes (Addendum)
Requested by:  Unk Pinto, MD [address] [place], [place] [address]  Reason for consultation: possible blue toe syndrome   History of Present Illness   [name] is a 72 y.o. ([birth year]) female who was referred to office for evaluation of possible blue toe syndrome by her PCP.  Past medical history significant for type 2 diabetes mellitus with peripheral neuropathy, hyperlipidemia, hypertension, tobacco history, and obesity.  About 1 month ago patient noticed discoloration of right great toe and left second toe.  She states the skin on the tips of these toes has turned black and has slowly sloughed off with healthy tissue behind it.  She believes this was related to wearing uncomfortable shoes and entire day for her granddaughter's wedding.  She denies history of claudication, rest pain, or other tissue changes.  She has no history of aneurysmal disease.  CT of abdomen about 1 year ago was negative for any aneurysmal disease in her aorta.  She denies any history of arrhythmias.  She is a former tobacco smoker who quit 5 years ago.  She is taking an aspirin and statin daily.  Past Medical History:  Diagnosis Date  . Anemia   . Chest tightness   . Fatigue   . GERD (gastroesophageal reflux disease)   . Hyperlipidemia   . Hypothyroid   . Internal hemorrhoids 05/04/2009   Qualifier: Diagnosis of  By: Nelson-Smith CMA (AAMA), Dottie    . Myalgia   . Obesity   . Prediabetes   . Ulcer   . Varicose veins     Past Surgical History:  Procedure Laterality Date  . ABDOMINAL HYSTERECTOMY    . CATARACT EXTRACTION    . NASAL SINUS SURGERY    . ROTATOR CUFF REPAIR Left more than 7 years ago   Dr. Percell Miller and Dr. Earleen Newport  . TONSILLECTOMY AND ADENOIDECTOMY      Social History   Socioeconomic History  . Marital status: Widowed    Spouse name: Not on file  . Number of children: 1  . Years of education: Not on file  . Highest education level: Not on file    Occupational History  . Occupation: [employer]    Employer: [employer]  . Financial resource strain: Not on file  . Food insecurity:    Worry: Not on file    Inability: Not on file  . Transportation needs:    Medical: Not on file    Non-medical: Not on file  Tobacco Use  . Smoking status: Former Smoker    Packs/day: 0.50    Years: 46.00    Pack years: 23.00    Types: Cigarettes    Last attempt to quit: 05/27/2013    Years since quitting: 4.8  . Smokeless tobacco: Never Used  Substance and Sexual Activity  . Alcohol use: Not on file  . Drug use: Not on file  . Sexual activity: Not on file  Lifestyle  . Physical activity:    Days per week: Not on file    Minutes per session: Not on file  . Stress: Not on file  Relationships  . Social connections:    Talks on phone: Not on file    Gets together: Not on file    Attends religious service: Not on file    Active member of club or organization: Not on file    Attends meetings of clubs or organizations: Not on file    Relationship  status: Not on file  . Intimate partner violence:    Fear of current or ex partner: Not on file    Emotionally abused: Not on file    Physically abused: Not on file    Forced sexual activity: Not on file  Other Topics Concern  . Not on file  Social History Narrative   Lives alone.  Widow.      Family History  Problem Relation Age of Onset  . Coronary artery disease Father 34    Current Outpatient Medications  Medication Sig Dispense Refill  . aspirin 81 MG tablet Take 81 mg by mouth daily.    Marland Kitchen atorvastatin (LIPITOR) 40 MG tablet Take 1 tablet by mouth at bedtime for cholesterol 90 tablet 1  . b complex vitamins tablet Take 1 tablet by mouth daily.    Marland Kitchen dicyclomine (BENTYL) 20 MG tablet Take 1 tablet (20 mg total) by mouth 3 (three) times daily as needed for spasms. 30 tablet 0  . diltiazem (DILACOR XR) 120 MG 24 hr capsule Take 1 capsule (120 mg total) by mouth  daily. 90 capsule 2  . furosemide (LASIX) 20 MG tablet Take 1 tablet (20 mg total) by mouth 2 (two) times daily. 180 tablet 1  . gabapentin (NEURONTIN) 800 MG tablet Take 1 tablet (800 mg total) by mouth 3 (three) times daily. 90 tablet 3  . levothyroxine (SYNTHROID, LEVOTHROID) 75 MCG tablet Take 1 tablet 3 days a week and 1.5 tablets 4 days a week or as directed. 108 tablet 1  . MEGARED OMEGA-3 KRILL OIL 500 MG CAPS Take 1 capsule by mouth daily.    . meloxicam (MOBIC) 7.5 MG tablet TAKE 1 TABLET DAILY WITH   FOOD FOR PAIN CAN TAKE A SECOND PILL LATER IN THE DAY IF YOU NEED IT 180 tablet 1  . Multiple Vitamin (MULTIVITAMIN) tablet Take 1 tablet by mouth daily.    . pantoprazole (PROTONIX) 20 MG tablet Take 1 tablet (20 mg total) by mouth daily. 90 tablet 2   No current facility-administered medications for this visit.     Allergies  Allergen Reactions  . Macrobid [Nitrofurantoin Macrocrystal]   . Penicillins     REACTION: rash  . Procaine Hcl     REACTION: difficulty breathing  . Sulfonamide Derivatives     REACTION: hives  . Tetanus Toxoid     REACTION: arm swells    REVIEW OF SYSTEMS (negative unless checked):   Cardiac:  []  Chest pain or chest pressure? []  Shortness of breath upon activity? []  Shortness of breath when lying flat? []  Irregular heart rhythm?  Vascular:  []  Pain in calf, thigh, or hip brought on by walking? []  Pain in feet at night that wakes you up from your sleep? []  Blood clot in your veins? [x]  Leg swelling?  Pulmonary:  []  Oxygen at home? []  Productive cough? []  Wheezing?  Neurologic:  []  Sudden weakness in arms or legs? []  Sudden numbness in arms or legs? []  Sudden onset of difficult speaking or slurred speech? []  Temporary loss of vision in one eye? []  Problems with dizziness?  Gastrointestinal:  []  Blood in stool? []  Vomited blood?  Genitourinary:  []  Burning when urinating? []  Blood in urine?  Psychiatric:  []  Major  depression  Hematologic:  []  Bleeding problems? []  Problems with blood clotting?  Dermatologic:  []  Rashes or ulcers?  Constitutional:  []  Fever or chills?  Ear/Nose/Throat:  []  Change in hearing? []  Nose bleeds? []  Sore throat?  Musculoskeletal:  []  Back pain? []  Joint pain? []  Muscle pain?   For VQI Use Only   PRE-ADM LIVING Home  AMB STATUS Ambulatory  CAD Sx None  PRIOR CHF None  STRESS TEST No   Physical Examination     Vitals:   04/02/18 1455  BP: 122/70  Pulse: 98  Resp: 20  Temp: 97.9 F (36.6 C)  TempSrc: Oral  SpO2: 95%  Weight: 241 lb (109.3 kg)  Height: 5' 7.5" (1.715 m)   Body mass index is 37.19 kg/m.  General Alert, O x 3, Obese, NAD  Neck Supple, mid-line trachea,    Pulmonary Sym exp, good B air movt, CTA B  Cardiac RRR, Nl S1, S2,   Vascular Vessel Right Left  Radial Palpable Palpable  Brachial Palpable Palpable  Carotid Palpable, No Bruit Palpable, No Bruit  Aorta Not palpable N/A  Popliteal Not palpable Not palpable  PT Palpable Palpable  DP Palpable Palpable    Gastro- intestinal soft, Exam limited due to large body habitus,  Musculo- skeletal M/S 5/5 throughout  , Right great toe tip small eschar; left second toe tip dry eschar  , No edema present, Scattered spider veins bilateral lower extremity's however no large varicosities noted,   Neurologic Cranial nerves 2-12 intact ,  Psychiatric Judgement intact, Mood & affect appropriate for pt's clinical situation  Dermatologic See M/S exam for extremity exam, No rashes otherwise noted  Lymphatic  Palpable lymph nodes: None    Non-Invasive Vascular imaging   BLE ABI (04/02/18)  R:   ABI: 1.09   PT: bi  DP: bi  TBI:  0.66  L:   ABI: 1.05   PT: bi  DP: tri  TBI: 0.75      Medical Decision Making   FLEUR AUDINO is a 72 y.o. female who presents with necrotic tissue changes of right great toe and left second toe   Palpable pedal pulses with normal  ABIs bilaterally  Toes appeared to be healing well with no sign of infection  Etiology unclear  CT of abdomen 1 year ago did not suggest any etiology of possible embolic source  We will check CTA of chest and aorta with bilateral lower extremity runoff for possible embolic etiology  If negative results will be called over the phone  Patient is agreeable to this plan   Dagoberto Ligas, PA-C [facility] Office: [phone]  04/02/2018, 3:49 PM   I agree with the above.  I have seen and evaluated the patient.  There is concern for bilateral thromboembolic events to the right great toe and left second toe.  She has palpable pedal pulses.  She is diabetic.  We discussed proceeding with CT angiography to rule out a possible embolic source.  I will try to get this done within the next week and contact her with the results.  If there is anything that needs to be intervened on we will discuss it at that time  Estanislado Spire

## 2018-04-12 ENCOUNTER — Ambulatory Visit: Payer: Self-pay

## 2018-04-17 ENCOUNTER — Ambulatory Visit
Admission: RE | Admit: 2018-04-17 | Discharge: 2018-04-17 | Disposition: A | Discharge status: Home / Self Care | Payer: PPO | Source: Ambulatory Visit | Attending: Surgery | Admitting: Surgery

## 2018-04-17 DIAGNOSIS — Z01812 Encounter for preprocedural laboratory examination: Secondary | ICD-10-CM

## 2018-04-17 DIAGNOSIS — I700 Atherosclerosis of aorta: Secondary | ICD-10-CM | POA: Diagnosis not present

## 2018-04-17 DIAGNOSIS — I75023 Atheroembolism of bilateral lower extremities: Secondary | ICD-10-CM

## 2018-04-17 MED ORDER — IOPAMIDOL (ISOVUE-370) INJECTION 76%
125.0000 mL | Freq: Once | INTRAVENOUS | Status: AC | PRN
  Administered 2018-04-17: 125 mL via INTRAVENOUS

## 2018-04-18 ENCOUNTER — Ambulatory Visit: Payer: PPO | Admitting: Podiatry

## 2018-04-18 ENCOUNTER — Encounter: Payer: Self-pay | Admitting: Podiatry

## 2018-04-18 VITALS — Temp 98.0°F

## 2018-04-18 DIAGNOSIS — B351 Tinea unguium: Secondary | ICD-10-CM | POA: Diagnosis not present

## 2018-04-18 DIAGNOSIS — L989 Disorder of the skin and subcutaneous tissue, unspecified: Secondary | ICD-10-CM | POA: Diagnosis not present

## 2018-04-18 DIAGNOSIS — M79676 Pain in unspecified toe(s): Secondary | ICD-10-CM | POA: Diagnosis not present

## 2018-04-18 DIAGNOSIS — E0843 Diabetes mellitus due to underlying condition with diabetic autonomic (poly)neuropathy: Secondary | ICD-10-CM

## 2018-04-18 NOTE — Progress Notes (Signed)
Assessment and Plan:  [name] was seen today for knee injury.  Diagnoses and all orders for this visit:  Hypothyroidism, unspecified type continue medications the same pending lab results reminded to take on an empty stomach 30-47mins before food.  -     TSH  Left knee injury, initial encounter CT reviewed; no acute fracture though does demonstrate advanced bilateral tricompartmental arthritis of knees. Edema not within joint, no signs of septic joint or infection. Will defer further imaging to ortho who is evaluating on Monday. She is doing fairly with pain control; advised to stop aleve, continue with mobic, can supplement with tylenol. Printed script for tramadol provided after discussion of risks/SE which patient acknowledges; to be used only if needed for severe pain over the weekend.  -     traMADol (ULTRAM) 50 MG tablet; Take 1 tab up to every 6 hours as needed for severe pain.  Further disposition pending results of labs. Discussed med's effects and SE's.   Over 15 minutes of exam, counseling, chart review, and critical decision making was performed.   Future Appointments  Date Time Provider [facility]  04/23/2018  3:00 PM Garald Balding, MD [facility] None  07/18/2018  2:45 PM Edrick Kins, DPM [facility]  08/14/2018 10:00 AM Vicie Mutters, PA-C [facility] None  04/02/2019  3:45 PM Vicie Mutters, PA-C [facility] None    ------------------------------------------------------------------------------------------------------------------   HPI 72 y.o.female presents for follow up TSH for hypothyroidism and evaluation of knee after recent mechanical fall after tripping over a cement block in a parking space on 04/11/2018, fell to her knees and caught her fall with hands/arms (had some minor scrapes now fully resolved, no upper extremity pain), but has left lateral anterior knee swelling/severe bruising, some warmth that is continuing. She reports a dull pain,  intermittent, 6/10, to anterolateral aspect of her left knee with occasional shooting pain down her leg. She is able to bear weight without notable worsening of pain. Pain is worst with full extension of lower leg. She is established with Dr. Durward Fortes in [place] ortho and has upcoming appointment for this issue on 6/3; she has been applying ice, elevating, taking mobic 7.5 mg BID for other chronic pain, and also took aleve additionally but reports this was not helpful.   She recently had angio/aorta CT for blue toe syndrome on 04/17/2018; reviewed these results which demonstrate no acute fracture of left knee but shows advanced tricompartmental arthritis bilaterally.   She is on thyroid medication. Her medication was changed last visit.   Lab Results  Component Value Date   TSH 7.06 (H) 03/08/2018  75 mcg -  1 pill 3 days a week but 1.5 pills 4 days a week   Past Medical History:  Diagnosis Date  . Anemia   . Chest tightness   . Fatigue   . GERD (gastroesophageal reflux disease)   . Hyperlipidemia   . Hypothyroid   . Internal hemorrhoids 05/04/2009   Qualifier: Diagnosis of  By: Nelson-Smith CMA (AAMA), Dottie    . Myalgia   . Obesity   . Prediabetes   . Ulcer   . Varicose veins      Allergies  Allergen Reactions  . Macrobid [Nitrofurantoin Macrocrystal]   . Penicillins     REACTION: rash  . Procaine Hcl     REACTION: difficulty breathing  . Sulfonamide Derivatives     REACTION: hives  . Tetanus Toxoid     REACTION: arm swells    Current Outpatient  Medications on File Prior to Visit  Medication Sig  . aspirin 81 MG tablet Take 81 mg by mouth daily.  Marland Kitchen atorvastatin (LIPITOR) 40 MG tablet Take 1 tablet by mouth at bedtime for cholesterol  . b complex vitamins tablet Take 1 tablet by mouth daily.  Marland Kitchen diltiazem (DILACOR XR) 120 MG 24 hr capsule Take 1 capsule (120 mg total) by mouth daily.  . furosemide (LASIX) 20 MG tablet Take 1 tablet (20 mg total) by mouth 2 (two) times  daily.  Marland Kitchen gabapentin (NEURONTIN) 800 MG tablet Take 1 tablet (800 mg total) by mouth 3 (three) times daily.  Marland Kitchen levothyroxine (SYNTHROID, LEVOTHROID) 75 MCG tablet Take 1 tablet 3 days a week and 1.5 tablets 4 days a week or as directed.  Marland Kitchen MEGARED OMEGA-3 KRILL OIL 500 MG CAPS Take 1 capsule by mouth daily.  . meloxicam (MOBIC) 7.5 MG tablet TAKE 1 TABLET DAILY WITH   FOOD FOR PAIN CAN TAKE A SECOND PILL LATER IN THE DAY IF YOU NEED IT  . Multiple Vitamin (MULTIVITAMIN) tablet Take 1 tablet by mouth daily.  . pantoprazole (PROTONIX) 20 MG tablet Take 1 tablet (20 mg total) by mouth daily.  Marland Kitchen dicyclomine (BENTYL) 20 MG tablet Take 1 tablet (20 mg total) by mouth 3 (three) times daily as needed for spasms.   No current facility-administered medications on file prior to visit.     ROS: Review of Systems  Constitutional: Negative for chills, diaphoresis, fever, malaise/fatigue and weight loss.  Respiratory: Negative for cough and shortness of breath.   Cardiovascular: Negative for chest pain and palpitations.  Musculoskeletal: Positive for falls (Mechanical) and joint pain (Left knee). Negative for back pain, myalgias and neck pain.  Neurological: Negative for dizziness, sensory change, loss of consciousness and headaches.    Physical Exam:  BP 138/80   Pulse 67   Temp (!) 97.3 F (36.3 C)   Ht 5' 7.5" (1.715 m)   Wt 243 lb (110.2 kg)   SpO2 96%   BMI 37.50 kg/m   General Appearance: Well nourished, in no apparent distress. Neck: Supple, thyroid normal.  Respiratory: Respiratory effort normal, BS equal bilaterally without rales, rhonchi, wheezing or stridor.  Cardio: RRR with no MRGs. Brisk peripheral pulses without edema.  Abdomen: Soft, + BS.  Non tender, no guarding, rebound, hernias, masses. Lymphatics: Non tender without lymphadenopathy.  Musculoskeletal: Full ROM, 5/5 strength, slow normal gait. Left knee with anterolateral pain with full extrension and palpation; localized  edema anterolaterally with some heat, no joint effusion, palpable bony abnormality or laxity of joint.  Skin: Warm, dry without rashes, lesions; she does have ecchymosis to left knee and left lower extremity.  Psych: Awake and oriented X 3, normal affect, Insight and Judgment appropriate.     Izora Ribas, NP 11:39 AM [facility]

## 2018-04-18 NOTE — Progress Notes (Signed)
   Subjective:    Patient ID: [name], female    DOB: 07-12-1946, 72 y.o.   MRN: [id number]  HPI    Review of Systems  All other systems reviewed and are negative.      Objective:   Physical Exam        Assessment & Plan:

## 2018-04-19 ENCOUNTER — Encounter: Payer: Self-pay | Admitting: Adult Health

## 2018-04-19 ENCOUNTER — Ambulatory Visit (INDEPENDENT_AMBULATORY_CARE_PROVIDER_SITE_OTHER): Payer: PPO | Admitting: Adult Health

## 2018-04-19 ENCOUNTER — Ambulatory Visit: Payer: Self-pay

## 2018-04-19 VITALS — BP 138/80 | HR 67 | Temp 97.3°F | Ht 67.5 in | Wt 243.0 lb

## 2018-04-19 DIAGNOSIS — E039 Hypothyroidism, unspecified: Secondary | ICD-10-CM

## 2018-04-19 DIAGNOSIS — S8992XA Unspecified injury of left lower leg, initial encounter: Secondary | ICD-10-CM

## 2018-04-19 MED ORDER — TRAMADOL HCL 50 MG PO TABS: ORAL_TABLET | ORAL | 0 refills | Status: DC

## 2018-04-19 NOTE — Patient Instructions (Addendum)
Tramadol tablets What is this medicine? TRAMADOL (TRA ma dole) is a pain reliever. It is used to treat moderate to severe pain in adults. This medicine may be used for other purposes; ask your health care provider or pharmacist if you have questions. COMMON BRAND NAME(S): Ultram What should I tell my health care provider before I take this medicine? They need to know if you have any of these conditions: -brain tumor -depression -drug abuse or addiction -head injury -if you frequently drink alcohol containing drinks -kidney disease or trouble passing urine -liver disease -lung disease, asthma, or breathing problems -seizures or epilepsy -suicidal thoughts, plans, or attempt; a previous suicide attempt by you or a family member -an unusual or allergic reaction to tramadol, codeine, other medicines, foods, dyes, or preservatives -pregnant or trying to get pregnant -breast-feeding How should I use this medicine? Take this medicine by mouth with a full glass of water. Follow the directions on the prescription label. You can take it with or without food. If it upsets your stomach, take it with food. Do not take your medicine more often than directed. A special MedGuide will be given to you by the pharmacist with each prescription and refill. Be sure to read this information carefully each time. Talk to your pediatrician regarding the use of this medicine in children. Special care may be needed. Overdosage: If you think you have taken too much of this medicine contact a poison control center or emergency room at once. NOTE: This medicine is only for you. Do not share this medicine with others. What if I miss a dose? If you miss a dose, take it as soon as you can. If it is almost time for your next dose, take only that dose. Do not take double or extra doses. What may interact with this medicine? Do not take this medication with any of the following medicines: -MAOIs like Carbex, Eldepryl,  Marplan, Nardil, and Parnate This medicine may also interact with the following medications: -alcohol -antihistamines for allergy, cough and cold -certain medicines for anxiety or sleep -certain medicines for depression like amitriptyline, fluoxetine, sertraline -certain medicines for migraine headache like almotriptan, eletriptan, frovatriptan, naratriptan, rizatriptan, sumatriptan, zolmitriptan -certain medicines for seizures like carbamazepine, oxcarbazepine, phenobarbital, primidone -certain medicines that treat or prevent blood clots like warfarin -digoxin -furazolidone -general anesthetics like halothane, isoflurane, methoxyflurane, propofol -linezolid -local anesthetics like lidocaine, pramoxine, tetracaine -medicines that relax muscles for surgery -other narcotic medicines for pain or cough -phenothiazines like chlorpromazine, mesoridazine, prochlorperazine, thioridazine -procarbazine This list may not describe all possible interactions. Give your health care provider a list of all the medicines, herbs, non-prescription drugs, or dietary supplements you use. Also tell them if you smoke, drink alcohol, or use illegal drugs. Some items may interact with your medicine. What should I watch for while using this medicine? Tell your doctor or health care professional if your pain does not go away, if it gets worse, or if you have new or a different type of pain. You may develop tolerance to the medicine. Tolerance means that you will need a higher dose of the medicine for pain relief. Tolerance is normal and is expected if you take this medicine for a long time. Do not suddenly stop taking your medicine because you may develop a severe reaction. Your body becomes used to the medicine. This does NOT mean you are addicted. Addiction is a behavior related to getting and using a drug for a non-medical reason. If you have pain, you   have a medical reason to take pain medicine. Your doctor will tell  you how much medicine to take. If your doctor wants you to stop the medicine, the dose will be slowly lowered over time to avoid any side effects. There are different types of narcotic medicines (opiates). If you take more than one type at the same time or if you are taking another medicine that also causes drowsiness, you may have more side effects. Give your health care provider a list of all medicines you use. Your doctor will tell you how much medicine to take. Do not take more medicine than directed. Call emergency for help if you have problems breathing or unusual sleepiness. You may get drowsy or dizzy. Do not drive, use machinery, or do anything that needs mental alertness until you know how this medicine affects you. Do not stand or sit up quickly, especially if you are an older patient. This reduces the risk of dizzy or fainting spells. Alcohol can increase or decrease the effects of this medicine. Avoid alcoholic drinks. You may have constipation. Try to have a bowel movement at least every 2 to 3 days. If you do not have a bowel movement for 3 days, call your doctor or health care professional. Your mouth may get dry. Chewing sugarless gum or sucking hard candy, and drinking plenty of water may help. Contact your doctor if the problem does not go away or is severe. What side effects may I notice from receiving this medicine? Side effects that you should report to your doctor or health care professional as soon as possible: -allergic reactions like skin rash, itching or hives, swelling of the face, lips, or tongue -breathing problems -confusion -seizures -signs and symptoms of low blood pressure like dizziness; feeling faint or lightheaded, falls; unusually weak or tired -trouble passing urine or change in the amount of urine Side effects that usually do not require medical attention (report to your doctor or health care professional if they continue or are bothersome): -constipation -dry  mouth -nausea, vomiting -tiredness This list may not describe all possible side effects. Call your doctor for medical advice about side effects. You may report side effects to FDA at 1-800-FDA-1088. Where should I keep my medicine? Keep out of the reach of children. This medicine may cause accidental overdose and death if it taken by other adults, children, or pets. Mix any unused medicine with a substance like cat litter or coffee grounds. Then throw the medicine away in a sealed container like a sealed bag or a coffee can with a lid. Do not use the medicine after the expiration date. Store at room temperature between 15 and 30 degrees C (59 and 86 degrees F). NOTE: This sheet is a summary. It may not cover all possible information. If you have questions about this medicine, talk to your doctor, pharmacist, or health care provider.  2018 Elsevier/Gold Standard (2015-08-02 09:00:04)  

## 2018-04-20 LAB — TSH: TSH: 3.06 mIU/L (ref 0.40–4.50)

## 2018-04-22 NOTE — Progress Notes (Signed)
    Subjective: Patient is a 72 y.o. female presenting to the office today as a new patient with a chief complaint of painful callus lesions to the bilateral feet that have been present for several weeks ago. She states she feel one week ago and her PCP wanted her feet evaluated. Bearing weight increases the pain of the callus lesions. She has not done anything for treatment.   Patient also complains of elongated, thickened nails that cause pain while ambulating in shoes. She is unable to trim her own nails. Patient presents today for further treatment and evaluation.  Past Medical History:  Diagnosis Date  . Anemia   . Chest tightness   . Fatigue   . GERD (gastroesophageal reflux disease)   . Hyperlipidemia   . Hypothyroid   . Internal hemorrhoids 05/04/2009   Qualifier: Diagnosis of  By: Nelson-Smith CMA (AAMA), Dottie    . Myalgia   . Obesity   . Prediabetes   . Ulcer   . Varicose veins     Objective:  Physical Exam General: Alert and oriented x3 in no acute distress  Dermatology: Hyperkeratotic lesions present on the bilateral feet x 4. Pain on palpation with a central nucleated core noted. Skin is warm, dry and supple bilateral lower extremities. Negative for open lesions or macerations. Nails are tender, long, thickened and dystrophic with subungual debris, consistent with onychomycosis, 1-5 bilateral. No signs of infection noted.  Vascular: Palpable pedal pulses bilaterally. No edema or erythema noted. Capillary refill within normal limits.  Neurological: Epicritic and protective threshold grossly intact bilaterally.   Musculoskeletal Exam: Pain on palpation at the keratotic lesion noted. Range of motion within normal limits bilateral. Muscle strength 5/5 in all groups bilateral.  Assessment: 1. Onychodystrophic nails 1-5 bilateral with hyperkeratosis of nails.  2. Onychomycosis of nail due to dermatophyte bilateral 3. Pre-ulcerative callus lesions noted to the bilateral  feet x 4   Plan of Care:  1. Patient evaluated. 2. Excisional debridement of keratoic lesion using a chisel blade was performed without incident.  3. Dressed with light dressing. 4. Mechanical debridement of nails 1-5 bilaterally performed using a nail nipper. Filed with dremel without incident.  5. Patient is to return to the clinic in 3 months.   Edrick Kins, DPM [facility]  Dr. Edrick Kins, [address]                                        [place], [place] [address]                Office [phone]  Fax [phone]

## 2018-04-23 ENCOUNTER — Ambulatory Visit (INDEPENDENT_AMBULATORY_CARE_PROVIDER_SITE_OTHER): Payer: Self-pay

## 2018-04-23 ENCOUNTER — Ambulatory Visit (INDEPENDENT_AMBULATORY_CARE_PROVIDER_SITE_OTHER): Payer: PPO

## 2018-04-23 ENCOUNTER — Telehealth: Payer: Self-pay | Admitting: Surgery

## 2018-04-23 ENCOUNTER — Ambulatory Visit (INDEPENDENT_AMBULATORY_CARE_PROVIDER_SITE_OTHER): Payer: PPO | Admitting: Orthopaedic Surgery

## 2018-04-23 ENCOUNTER — Encounter (INDEPENDENT_AMBULATORY_CARE_PROVIDER_SITE_OTHER): Payer: Self-pay | Admitting: Orthopaedic Surgery

## 2018-04-23 VITALS — BP 112/61 | HR 63 | Ht 67.5 in | Wt 240.0 lb

## 2018-04-23 DIAGNOSIS — M25562 Pain in left knee: Secondary | ICD-10-CM | POA: Diagnosis not present

## 2018-04-23 DIAGNOSIS — G8929 Other chronic pain: Secondary | ICD-10-CM | POA: Diagnosis not present

## 2018-04-23 DIAGNOSIS — M25561 Pain in right knee: Secondary | ICD-10-CM

## 2018-04-23 NOTE — Progress Notes (Signed)
Office Visit Note   Patient: [name]           Date of Birth: 11/02/1946           MRN: [id number] Visit Date: 04/23/2018              Requested by: Vicie Mutters, PA-C [address] [place], [place] [address] PCP: Unk Pinto, MD   Assessment & Plan: Visit Diagnoses:  1. Chronic pain of left knee   2. Chronic pain of right knee     Plan: Status post fall landing on both knees.  No evidence of acute change.  Has end-stage osteoarthritis both knees.  Will consider osteoarthritis treatment once the bruising subsides.  We will just treat symptomatically with over-the-counter medicines. Follow-Up Instructions: Return if symptoms worsen or fail to improve.   Orders:  Orders Placed This Encounter  Procedures  . XR KNEE 3 VIEW LEFT  . XR KNEE 3 VIEW RIGHT   No orders of the defined types were placed in this encounter.     Procedures: No procedures performed   Clinical Data: No additional findings.   Subjective: Chief Complaint  Patient presents with  . Left Knee - Pain  . New Patient (Initial Visit)    04/11/18 PT FELL ON KNEES AND HAS GOTTEN WORSE SINCE THE FALL. LEGS FEEL HEAVY LIKE LEAD PT HAS HX OF NEUROPATHY  [name] relates that she fell directly on both of her knees on 22 May.  She is had a fair amount of bruising more on the left than the right but does not use any ambulatory aid and not having much pain at this point.  She is concerned for fracture".  No numbness or tingling.  Somewhat stiff and achy when she goes up and down stairs or when she once gets up from a sitting position.  [name] takes an 81 mg aspirin every day  HPI  Review of Systems  Constitutional: Positive for fatigue. Negative for fever.  HENT: Negative for ear pain.   Eyes: Negative for pain.  Respiratory: Negative for cough and shortness of breath.   Cardiovascular: Positive for leg swelling.  Gastrointestinal: Negative for constipation and diarrhea.    Genitourinary: Negative for difficulty urinating.  Musculoskeletal: Positive for back pain. Negative for neck pain.  Skin: Negative for rash.  Allergic/Immunologic: Negative for food allergies.  Neurological: Positive for weakness and numbness.  Hematological: Does not bruise/bleed easily.  Psychiatric/Behavioral: Positive for sleep disturbance.     Objective: Vital Signs: BP 112/61 (BP Location: Left Arm, Patient Position: Sitting, Cuff Size: Normal)   Pulse 63   Ht 5' 7.5" (1.715 m)   Wt 240 lb (108.9 kg)   BMI 37.03 kg/m   Physical Exam  Constitutional: She is oriented to person, place, and time. She appears well-developed and well-nourished.  HENT:  Mouth/Throat: Oropharynx is clear and moist.  Eyes: Pupils are equal, round, and reactive to light. EOM are normal.  Pulmonary/Chest: Effort normal.  Neurological: She is alert and oriented to person, place, and time.  Skin: Skin is warm and dry.  Psychiatric: She has a normal mood and affect. Her behavior is normal.    Ortho Exam awake alert and oriented x3.  Comfortable sitting.  Considerable bruising beginning at the prepatellar region of left knee extending along the anterior tibial region to the ankle.  Some chronic nonpitting edema of left ankle.  Good pulses.  Sensory exam intact.  Able to fully extend  the left knee and flex over 100 degrees.  No instability.  No open areas.  No obvious effusion.  No instability.  No swelling of right ankle.  Very minimal bruising along the anterior aspect of the right knee without effusion.  No instability.  Minimal medial lateral joint pain   No specialty comments available.  Imaging: Xr Knee 3 View Left  Result Date: 04/23/2018 Films of the right knee demonstrate significant degenerative changes in all 3 compartments.  The joint spaces are maintained.  No acute change.  No ectopic calcification.  Normal alignment.  Subchondral sclerosis peripheral osteophytes in all 3  compartments  Xr Knee 3 View Right  Result Date: 04/23/2018 Films of the right knee were obtained in 3 projections standing.  There is significant tricompartmental degenerative changes predominate the lateral compartment where there is joint space narrowing, subchondral sclerosis and large peripheral osteophytes.  Approximately 7 degrees of valgus.  No ectopic calcification or evidence of acute fracture also considerable degenerative change at the patellofemoral joint.  Films consistent with end-stage osteoarthritis without an acute component    PMFS History: Patient Active Problem List   Diagnosis Date Noted  . Toe gangrene ([place]) 04/02/2018  . History of basal cell carcinoma (BCC) 08/10/2017  . Hypertension 02/27/2017  . Chronic venous insufficiency 06/02/2015  . CKD (chronic kidney disease) stage 2, GFR 60-89 ml/min 06/02/2015  . Peripheral autonomic neuropathy of unknown cause 06/02/2015  . Morbid obesity ([place]) 08/19/2014  . Hypothyroid   . Anemia   . Diabetes mellitus type 2, controlled, with complications ([place])   . History of tobacco use 04/09/2012  . Hyperlipidemia   . GERD 05/04/2009  . Diverticulosis of large intestine 05/04/2009   Past Medical History:  Diagnosis Date  . Anemia   . Chest tightness   . Fatigue   . GERD (gastroesophageal reflux disease)   . Hyperlipidemia   . Hypothyroid   . Internal hemorrhoids 05/04/2009   Qualifier: Diagnosis of  By: Nelson-Smith CMA (AAMA), Dottie    . Myalgia   . Obesity   . Prediabetes   . Ulcer   . Varicose veins     Family History  Problem Relation Age of Onset  . Coronary artery disease Father 75    Past Surgical History:  Procedure Laterality Date  . ABDOMINAL HYSTERECTOMY    . CATARACT EXTRACTION    . NASAL SINUS SURGERY    . ROTATOR CUFF REPAIR Left more than 7 years ago   Dr. Percell Miller and Dr. Earleen Newport  . TONSILLECTOMY AND ADENOIDECTOMY     Social History   Occupational History  . 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 Smoking status: Former Smoker    Packs/day: 0.50    Years: 46.00    Pack years: 23.00    Types: Cigarettes    Last attempt to quit: 05/27/2013    Years since quitting: 4.9  . Smokeless tobacco: Never Used  Substance and Sexual Activity  . Alcohol use: Not Currently  . Drug use: Not Currently  . Sexual activity: Not on file

## 2018-04-23 NOTE — Telephone Encounter (Signed)
Updated patient with CT scan results.  No f/u for vascular needed.  Will schedule patient to see GI for Duodenal polyp

## 2018-04-24 ENCOUNTER — Encounter: Payer: Self-pay | Admitting: Gastroenterology

## 2018-04-30 ENCOUNTER — Other Ambulatory Visit: Payer: Self-pay | Admitting: *Deleted

## 2018-04-30 MED ORDER — GABAPENTIN 800 MG PO TABS: 800.0000 mg | ORAL_TABLET | Freq: Three times a day (TID) | ORAL | 3 refills | Status: DC

## 2018-06-05 ENCOUNTER — Other Ambulatory Visit: Payer: Self-pay | Admitting: *Deleted

## 2018-06-05 MED ORDER — LEVOTHYROXINE SODIUM 75 MCG PO TABS: ORAL_TABLET | ORAL | 1 refills | Status: DC

## 2018-06-06 ENCOUNTER — Other Ambulatory Visit: Payer: Self-pay | Admitting: Internal Medicine

## 2018-06-06 DIAGNOSIS — L820 Inflamed seborrheic keratosis: Secondary | ICD-10-CM | POA: Diagnosis not present

## 2018-06-06 DIAGNOSIS — L720 Epidermal cyst: Secondary | ICD-10-CM | POA: Diagnosis not present

## 2018-06-06 DIAGNOSIS — L819 Disorder of pigmentation, unspecified: Secondary | ICD-10-CM | POA: Diagnosis not present

## 2018-06-06 DIAGNOSIS — D485 Neoplasm of uncertain behavior of skin: Secondary | ICD-10-CM | POA: Diagnosis not present

## 2018-06-08 ENCOUNTER — Ambulatory Visit: Payer: PPO | Admitting: Gastroenterology

## 2018-06-12 ENCOUNTER — Ambulatory Visit (INDEPENDENT_AMBULATORY_CARE_PROVIDER_SITE_OTHER): Payer: PPO | Admitting: Orthopaedic Surgery

## 2018-06-12 ENCOUNTER — Encounter (INDEPENDENT_AMBULATORY_CARE_PROVIDER_SITE_OTHER): Payer: Self-pay

## 2018-06-12 ENCOUNTER — Encounter (INDEPENDENT_AMBULATORY_CARE_PROVIDER_SITE_OTHER): Payer: Self-pay | Admitting: Orthopaedic Surgery

## 2018-06-12 VITALS — BP 119/64 | HR 63 | Ht 67.5 in | Wt 240.0 lb

## 2018-06-12 DIAGNOSIS — M1711 Unilateral primary osteoarthritis, right knee: Secondary | ICD-10-CM

## 2018-06-12 DIAGNOSIS — M170 Bilateral primary osteoarthritis of knee: Secondary | ICD-10-CM

## 2018-06-12 DIAGNOSIS — M1710 Unilateral primary osteoarthritis, unspecified knee: Secondary | ICD-10-CM | POA: Insufficient documentation

## 2018-06-12 DIAGNOSIS — M1712 Unilateral primary osteoarthritis, left knee: Secondary | ICD-10-CM

## 2018-06-12 DIAGNOSIS — M179 Osteoarthritis of knee, unspecified: Secondary | ICD-10-CM | POA: Insufficient documentation

## 2018-06-12 MED ORDER — LIDOCAINE HCL 1 % IJ SOLN
2.0000 mL | INTRAMUSCULAR | Status: AC | PRN
  Administered 2018-06-12: 2 mL

## 2018-06-12 MED ORDER — METHYLPREDNISOLONE ACETATE 40 MG/ML IJ SUSP
80.0000 mg | INTRAMUSCULAR | Status: AC | PRN
  Administered 2018-06-12: 80 mg

## 2018-06-12 MED ORDER — BUPIVACAINE HCL 0.5 % IJ SOLN
2.0000 mL | INTRAMUSCULAR | Status: AC | PRN
  Administered 2018-06-12: 2 mL via INTRA_ARTICULAR

## 2018-06-12 NOTE — Progress Notes (Signed)
Office Visit Note   Patient: [name]           Date of Birth: October 15, 1946           MRN: [id number] Visit Date: 06/12/2018              Requested by: Unk Pinto, MD [address] [place], [place] [address] PCP: Unk Pinto, MD   Assessment & Plan: Visit Diagnoses:  1. Bilateral primary osteoarthritis of knee     Plan: Advanced osteoarthritis both knees feeling better after her accident over a month ago.  Will inject both knees with cortisone and reevaluate in 4 to 6 weeks if she still having trouble.  Consider Visco supplementation over time  Follow-Up Instructions: Return if symptoms worsen or fail to improve.   Orders:  Orders Placed This Encounter  Procedures  . Large Joint Inj: bilateral knee   No orders of the defined types were placed in this encounter.     Procedures: Large Joint Inj: bilateral knee on 06/12/2018 1:45 PM Indications: diagnostic evaluation Details: 25 G 1.5 in needle, anteromedial approach  Arthrogram: No  Medications (Right): 2 mL lidocaine 1 %; 2 mL bupivacaine 0.5 %; 80 mg methylPREDNISolone acetate 40 MG/ML Medications (Left): 2 mL lidocaine 1 %; 2 mL bupivacaine 0.5 %; 80 mg methylPREDNISolone acetate 40 MG/ML Consent was given by the patient. Immediately prior to procedure a time out was called to verify the correct patient, procedure, equipment, support staff and site/side marked as required. Patient was prepped and draped in the usual sterile fashion.       Clinical Data: No additional findings.   Subjective: Chief Complaint  Patient presents with  . Follow-up    04/11/18 L KNEE PAIN FROM FALL STILL HAVING BRUISING AND MUSCLE IS HARD ON LATER SIDE OF KNEE. R KNEE HAVING PAIN FOR 2 YRS JUST GETTING WORSE HAS STIFFNESS IN THIGHS ABOVE THE KNEES USES APERCREAM  [name] notes that she is feeling better after falling on both of her knees as previously noted.  Areas of ecchymosis are near resolved.  She  is now experiencing some discomfort in both of her knees.  Films note advanced osteoarthritis.  Experiencing some compromise of her activities  HPI  Review of Systems  Constitutional: Positive for fatigue. Negative for fever.  HENT: Negative for ear pain.   Eyes: Positive for pain.  Respiratory: Negative for cough and shortness of breath.   Cardiovascular: Positive for leg swelling.  Gastrointestinal: Negative for constipation and diarrhea.  Genitourinary: Negative for difficulty urinating.  Musculoskeletal: Negative for back pain and neck pain.  Skin: Negative for rash.  Allergic/Immunologic: Negative for food allergies.  Neurological: Positive for weakness and numbness.  Hematological: Bruises/bleeds easily.  Psychiatric/Behavioral: Negative for sleep disturbance.     Objective: Vital Signs: BP 119/64 (BP Location: Left Arm, Patient Position: Sitting, Cuff Size: Normal)   Pulse 63   Ht 5' 7.5" (1.715 m)   Wt 240 lb (108.9 kg)   BMI 37.03 kg/m   Physical Exam  Constitutional: She is oriented to person, place, and time. She appears well-developed and well-nourished.  HENT:  Mouth/Throat: Oropharynx is clear and moist.  Eyes: Pupils are equal, round, and reactive to light. EOM are normal.  Pulmonary/Chest: Effort normal.  Neurological: She is alert and oriented to person, place, and time.  Skin: Skin is warm and dry.  Psychiatric: She has a normal mood and affect. Her behavior is normal.    Ortho  Exam awake alert and oriented x3.  Comfortable sitting.  Still some areas of induration on the left knee and not the right.  Experiencing a bit more trouble in the left knee medially and in the right knee laterally.  No effusion.  Full extension and flexion over 100 degrees without instability.  No calf pain.  No popliteal discomfort.  Straight leg raise negative.  Painless range of motion both hips No specialty comments available.  Imaging: No results found.   PMFS  History: Patient Active Problem List   Diagnosis Date Noted  . Bilateral primary osteoarthritis of knee 06/12/2018  . Toe gangrene ([place]) 04/02/2018  . History of basal cell carcinoma (BCC) 08/10/2017  . Hypertension 02/27/2017  . Chronic venous insufficiency 06/02/2015  . CKD (chronic kidney disease) stage 2, GFR 60-89 ml/min 06/02/2015  . Peripheral autonomic neuropathy of unknown cause 06/02/2015  . Morbid obesity ([place]) 08/19/2014  . Hypothyroid   . Anemia   . Diabetes mellitus type 2, controlled, with complications ([place])   . History of tobacco use 04/09/2012  . Hyperlipidemia   . GERD 05/04/2009  . Diverticulosis of large intestine 05/04/2009   Past Medical History:  Diagnosis Date  . Anemia   . Chest tightness   . Fatigue   . GERD (gastroesophageal reflux disease)   . Hyperlipidemia   . Hypothyroid   . Internal hemorrhoids 05/04/2009   Qualifier: Diagnosis of  By: Nelson-Smith CMA (AAMA), Dottie    . Myalgia   . Obesity   . Prediabetes   . Ulcer   . Varicose veins     Family History  Problem Relation Age of Onset  . Coronary artery disease Father 36    Past Surgical History:  Procedure Laterality Date  . ABDOMINAL HYSTERECTOMY    . CATARACT EXTRACTION    . NASAL SINUS SURGERY    . ROTATOR CUFF REPAIR Left more than 7 years ago   Dr. Percell Miller and Dr. Earleen Newport  . TONSILLECTOMY AND ADENOIDECTOMY     Social History   Occupational History  . 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 Smoking status: Former Smoker    Packs/day: 0.50    Years: 46.00    Pack years: 23.00    Types: Cigarettes    Last attempt to quit: 05/27/2013    Years since quitting: 5.0  . Smokeless tobacco: Never Used  Substance and Sexual Activity  . Alcohol use: Not Currently  . Drug use: Not Currently  . Sexual activity: Not on file

## 2018-07-17 ENCOUNTER — Ambulatory Visit (INDEPENDENT_AMBULATORY_CARE_PROVIDER_SITE_OTHER): Payer: PPO | Admitting: Physician Assistant

## 2018-07-17 ENCOUNTER — Other Ambulatory Visit: Payer: Self-pay

## 2018-07-17 ENCOUNTER — Encounter: Payer: Self-pay | Admitting: Physician Assistant

## 2018-07-17 VITALS — BP 138/72 | HR 67 | Temp 98.2°F | Resp 18 | Ht 67.5 in | Wt 246.6 lb

## 2018-07-17 DIAGNOSIS — R350 Frequency of micturition: Secondary | ICD-10-CM

## 2018-07-17 DIAGNOSIS — M545 Low back pain, unspecified: Secondary | ICD-10-CM

## 2018-07-17 DIAGNOSIS — H00012 Hordeolum externum right lower eyelid: Secondary | ICD-10-CM | POA: Diagnosis not present

## 2018-07-17 MED ORDER — FLUCONAZOLE 150 MG PO TABS: 150.0000 mg | ORAL_TABLET | Freq: Every day | ORAL | 3 refills | Status: DC

## 2018-07-17 MED ORDER — MELOXICAM 7.5 MG PO TABS: ORAL_TABLET | ORAL | 1 refills | Status: DC

## 2018-07-17 MED ORDER — ERYTHROMYCIN 5 MG/GM OP OINT: TOPICAL_OINTMENT | OPHTHALMIC | 0 refills | Status: DC

## 2018-07-17 MED ORDER — CIPROFLOXACIN HCL 500 MG PO TABS: 500.0000 mg | ORAL_TABLET | Freq: Two times a day (BID) | ORAL | 0 refills | Status: AC

## 2018-07-17 NOTE — Progress Notes (Signed)
Subjective:    Patient ID: [name], female    DOB: 07-Nov-1946, 72 y.o.   MRN: [id number]  HPI 72 y.o. WF presents with possible UTI.  She has bilateral back pain, central, worse with certain movements, can "take her breath away and catch." She has bilateral anterior thigh stiffness/pain but no radiating pain down her legs. Had a fall in June, injured left knee. She has some urinary frequency.  She has syte on right lower eye x 3 weeks, that has not gotten better.   Medications Current Outpatient Medications on File Prior to Visit  Medication Sig  . Acetaminophen (TYLENOL ARTHRITIS PAIN PO) Take by mouth.  Marland Kitchen aspirin 81 MG tablet Take 81 mg by mouth daily.  Marland Kitchen atorvastatin (LIPITOR) 40 MG tablet Take 1 tablet by mouth at bedtime for cholesterol  . b complex vitamins tablet Take 1 tablet by mouth daily.  Marland Kitchen diltiazem (DILACOR XR) 120 MG 24 hr capsule Take 1 capsule (120 mg total) by mouth daily.  . dorzolamide-timolol (COSOPT) 22.3-6.8 MG/ML ophthalmic solution USE 1 DROP INTO THE AFFECTED EYE 2 TIMES DAILY  . furosemide (LASIX) 20 MG tablet Take 1 tablet (20 mg total) by mouth 2 (two) times daily.  Marland Kitchen gabapentin (NEURONTIN) 800 MG tablet Take 1 tablet (800 mg total) by mouth 3 (three) times daily.  Marland Kitchen levothyroxine (SYNTHROID, LEVOTHROID) 75 MCG tablet Take 1 tablet 3 days a week and 1.5 tablets 4 days a week or as directed.  Marland Kitchen MEGARED OMEGA-3 KRILL OIL 500 MG CAPS Take 1 capsule by mouth daily.  . Multiple Vitamin (MULTIVITAMIN) tablet Take 1 tablet by mouth daily.  . pantoprazole (PROTONIX) 20 MG tablet Take 1 tablet (20 mg total) by mouth daily.  . traMADol (ULTRAM) 50 MG tablet Take 1 tab up to every 6 hours as needed for severe pain.  Marland Kitchen dicyclomine (BENTYL) 20 MG tablet Take 1 tablet (20 mg total) by mouth 3 (three) times daily as needed for spasms.   No current facility-administered medications on file prior to visit.     Problem list She has GERD; Diverticulosis of large  intestine; History of tobacco use; Hyperlipidemia; Hypothyroid; Anemia; Diabetes mellitus type 2, controlled, with complications ([place]); Morbid obesity ([place]); Chronic venous insufficiency; CKD (chronic kidney disease) stage 2, GFR 60-89 ml/min; Peripheral autonomic neuropathy of unknown cause; Hypertension; History of basal cell carcinoma (BCC); Toe gangrene ([place]); and Bilateral primary osteoarthritis of knee on their problem list.  Review of Systems See HPI    Objective:   Physical Exam  Constitutional: She is oriented to person, place, and time. She appears well-developed and well-nourished.  HENT:  Head: Normocephalic and atraumatic.  Eyes: Pupils are equal, round, and reactive to light. Conjunctivae are normal.  Neck: Normal range of motion. Neck supple.  Cardiovascular: Normal rate and regular rhythm.  Pulmonary/Chest: Effort normal and breath sounds normal.  Abdominal: Soft. Bowel sounds are normal. There is no tenderness.  Musculoskeletal:  Patient is able to ambulate well. Gait is Antalgic. Straight leg raising with dorsiflexion negative bilaterally for radicular symptoms. Sensory exam in the legs are abnormal bilaterally but this is normal for patient. Knee reflexes are normal, left knee with some swelling and tenderness, Ankle reflexes are normal Strength is normal and symmetric in arms and legs. There is not SI tenderness to palpation.  There is not paraspinal muscle spasm  There is not midline tenderness.  ROM of spine with  limited in all spheres due to pain, worse with  flexion  Lymphadenopathy:    She has no cervical adenopathy.  Neurological: She is alert and oriented to person, place, and time. She has normal reflexes.  Skin: Skin is warm and dry. No rash noted.       Assessment & Plan:    Acute midline low back pain without sciatica -     CBC with Differential/Platelet -     COMPLETE METABOLIC PANEL WITH GFR -     DG Lumbar Spine Complete; Future- rule out compression  fracture from fall  Urinary frequency- hold ABX until labs return -     ciprofloxacin (CIPRO) 500 MG tablet; Take 1 tablet (500 mg total) by mouth 2 (two) times daily for 7 days. -     Urinalysis, Routine w reflex microscopic -     Urine Culture -     fluconazole (DIFLUCAN) 150 MG tablet; Take 1 tablet (150 mg total) by mouth daily.  Hordeolum externum of right lower eyelid -     erythromycin ophthalmic ointment; Apply 1 cm ribbon in affected eye(s) up to 2 times a day   The patient was advised to call immediately if she has any concerning symptoms in the interval. The patient voices understanding of current treatment options and is in agreement with the current care plan.The patient knows to call the clinic with any problems, questions or concerns or go to the ER if any further progression of symptoms.

## 2018-07-17 NOTE — Patient Instructions (Signed)
Try the exercises and other information in the back care manual, meloxicam once during the day as needed (avoid taking other NSAIDS like Alleve or Ibuprofen while taking this) and then flexeril if needed at bedtime for muscle spasm. This can be taken up to every 8 hours, but causes sedation, so should not drive or operate heavy machinery while taking this medicine.   Go to the ER if you have any new weakness in your legs, have trouble controlling your urine or bowels, or have worsening pain.   If you are not better in 1-3 month we will refer you to ortho   Back pain Rehab Ask your health care provider which exercises are safe for you. Do exercises exactly as told by your health care provider and adjust them as directed. It is normal to feel mild stretching, pulling, tightness, or discomfort as you do these exercises, but you should stop right away if you feel sudden pain or your pain gets worse.Do not begin these exercises until told by your health care provider. Stretching and range of motion exercises These exercises warm up your muscles and joints and improve the movement and flexibility of your hips and your back. These exercises also help to relieve pain, numbness, and tingling. Exercise A: Sciatic nerve glide 1. Sit in a chair with your head facing down toward your chest. Place your hands behind your back. Let your shoulders slump forward. 2. Slowly straighten one of your knees while you tilt your head back as if you are looking toward the ceiling. Only straighten your leg as far as you can without making your symptoms worse. 3. Hold for __________ seconds. 4. Slowly return to the starting position. 5. Repeat with your other leg. Repeat __________ times. Complete this exercise __________ times a day. Exercise B: Knee to chest with hip adduction and internal rotation  1. Lie on your back on a firm surface with both legs straight. 2. Bend one of your knees and move it up toward your chest  until you feel a gentle stretch in your lower back and buttock. Then, move your knee toward the shoulder that is on the opposite side from your leg. ? Hold your leg in this position by holding onto the front of your knee. 3. Hold for __________ seconds. 4. Slowly return to the starting position. 5. Repeat with your other leg. Repeat __________ times. Complete this exercise __________ times a day. Exercise C: Prone extension on elbows  1. Lie on your abdomen on a firm surface. A bed may be too soft for this exercise. 2. Prop yourself up on your elbows. 3. Use your arms to help lift your chest up until you feel a gentle stretch in your abdomen and your lower back. ? This will place some of your body weight on your elbows. If this is uncomfortable, try stacking pillows under your chest. ? Your hips should stay down, against the surface that you are lying on. Keep your hip and back muscles relaxed. 4. Hold for __________ seconds. 5. Slowly relax your upper body and return to the starting position. Repeat __________ times. Complete this exercise __________ times a day. Strengthening exercises These exercises build strength and endurance in your back. Endurance is the ability to use your muscles for a long time, even after they get tired. Exercise D: Pelvic tilt 1. Lie on your back on a firm surface. Bend your knees and keep your feet flat. 2. Tense your abdominal muscles. Tip your pelvis up   toward the ceiling and flatten your lower back into the floor. ? To help with this exercise, you may place a small towel under your lower back and try to push your back into the towel. 3. Hold for __________ seconds. 4. Let your muscles relax completely before you repeat this exercise. Repeat __________ times. Complete this exercise __________ times a day. Exercise E: Alternating arm and leg raises  1. Get on your hands and knees on a firm surface. If you are on a hard floor, you may want to use padding to  cushion your knees, such as an exercise mat. 2. Line up your arms and legs. Your hands should be below your shoulders, and your knees should be below your hips. 3. Lift your left leg behind you. At the same time, raise your right arm and straighten it in front of you. ? Do not lift your leg higher than your hip. ? Do not lift your arm higher than your shoulder. ? Keep your abdominal and back muscles tight. ? Keep your hips facing the ground. ? Do not arch your back. ? Keep your balance carefully, and do not hold your breath. 4. Hold for __________ seconds. 5. Slowly return to the starting position and repeat with your right leg and your left arm. Repeat __________ times. Complete this exercise __________ times a day. Posture and body mechanics  Body mechanics refers to the movements and positions of your body while you do your daily activities. Posture is part of body mechanics. Good posture and healthy body mechanics can help to relieve stress in your body's tissues and joints. Good posture means that your spine is in its natural S-curve position (your spine is neutral), your shoulders are pulled back slightly, and your head is not tipped forward. The following are general guidelines for applying improved posture and body mechanics to your everyday activities. Standing   When standing, keep your spine neutral and your feet about hip-width apart. Keep a slight bend in your knees. Your ears, shoulders, and hips should line up.  When you do a task in which you stand in one place for a long time, place one foot up on a stable object that is 2-4 inches (5-10 cm) high, such as a footstool. This helps keep your spine neutral. Sitting   When sitting, keep your spine neutral and keep your feet flat on the floor. Use a footrest, if necessary, and keep your thighs parallel to the floor. Avoid rounding your shoulders, and avoid tilting your head forward.  When working at a desk or a computer, keep  your desk at a height where your hands are slightly lower than your elbows. Slide your chair under your desk so you are close enough to maintain good posture.  When working at a computer, place your monitor at a height where you are looking straight ahead and you do not have to tilt your head forward or downward to look at the screen. Resting   When lying down and resting, avoid positions that are most painful for you.  If you have pain with activities such as sitting, bending, stooping, or squatting (flexion-based activities), lie in a position in which your body does not bend very much. For example, avoid curling up on your side with your arms and knees near your chest (fetal position).  If you have pain with activities such as standing for a long time or reaching with your arms (extension-based activities), lie with your spine in a neutral   position and bend your knees slightly. Try the following positions: ? Lying on your side with a pillow between your knees. ? Lying on your back with a pillow under your knees. Lifting   When lifting objects, keep your feet at least shoulder-width apart and tighten your abdominal muscles.  Bend your knees and hips and keep your spine neutral. It is important to lift using the strength of your legs, not your back. Do not lock your knees straight out.  Always ask for help to lift heavy or awkward objects. This information is not intended to replace advice given to you by your health care provider. Make sure you discuss any questions you have with your health care provider. Document Released: 11/07/2005 Document Revised: 07/14/2016 Document Reviewed: 07/24/2015 Elsevier Interactive Patient Education  2018 [employer] Inc[employer]   

## 2018-07-18 ENCOUNTER — Ambulatory Visit: Payer: PPO | Admitting: Podiatry

## 2018-07-18 LAB — COMPLETE METABOLIC PANEL WITH GFR
AG RATIO: 1.7 (calc) (ref 1.0–2.5)
ALBUMIN MSPROF: 4.1 g/dL (ref 3.6–5.1)
ALT: 22 U/L (ref 6–29)
AST: 21 U/L (ref 10–35)
Alkaline phosphatase (APISO): 88 U/L (ref 33–130)
BILIRUBIN TOTAL: 0.5 mg/dL (ref 0.2–1.2)
BUN/Creatinine Ratio: 16 (calc) (ref 6–22)
BUN: 18 mg/dL (ref 7–25)
CHLORIDE: 106 mmol/L (ref 98–110)
CO2: 28 mmol/L (ref 20–32)
Calcium: 9.2 mg/dL (ref 8.6–10.4)
Creat: 1.12 mg/dL — ABNORMAL HIGH (ref 0.60–0.93)
GFR, EST AFRICAN AMERICAN: 58 mL/min/{1.73_m2} — AB (ref >=60)
GFR, EST NON AFRICAN AMERICAN: 50 mL/min/{1.73_m2} — AB (ref >=60)
Globulin: 2.4 g/dL (calc) (ref 1.9–3.7)
Glucose, Bld: 81 mg/dL (ref 65–99)
POTASSIUM: 4.9 mmol/L (ref 3.5–5.3)
Sodium: 142 mmol/L (ref 135–146)
TOTAL PROTEIN: 6.5 g/dL (ref 6.1–8.1)

## 2018-07-18 LAB — CBC WITH DIFFERENTIAL/PLATELET
BASOS ABS: 56 {cells}/uL (ref 0–200)
Basophils Relative: 0.5 %
Eosinophils Absolute: 213 cells/uL (ref 15–500)
Eosinophils Relative: 1.9 %
HCT: 38.5 % (ref 35.0–45.0)
HEMOGLOBIN: 12.8 g/dL (ref 11.7–15.5)
Lymphs Abs: 3662 cells/uL (ref 850–3900)
MCH: 28 pg (ref 27.0–33.0)
MCHC: 33.2 g/dL (ref 32.0–36.0)
MCV: 84.2 fL (ref 80.0–100.0)
MONOS PCT: 8.2 %
MPV: 10.6 fL (ref 7.5–12.5)
NEUTROS ABS: 6350 {cells}/uL (ref 1500–7800)
Neutrophils Relative %: 56.7 %
PLATELETS: 303 10*3/uL (ref 140–400)
RBC: 4.57 10*6/uL (ref 3.80–5.10)
RDW: 14.1 % (ref 11.0–15.0)
TOTAL LYMPHOCYTE: 32.7 %
WBC mixed population: 918 cells/uL (ref 200–950)
WBC: 11.2 10*3/uL — ABNORMAL HIGH (ref 3.8–10.8)

## 2018-07-18 LAB — URINALYSIS, ROUTINE W REFLEX MICROSCOPIC
BILIRUBIN URINE: NEGATIVE
GLUCOSE, UA: NEGATIVE
HGB URINE DIPSTICK: NEGATIVE
Ketones, ur: NEGATIVE
Leukocytes, UA: NEGATIVE
Nitrite: NEGATIVE
PROTEIN: NEGATIVE
Specific Gravity, Urine: 1.011 (ref 1.001–1.03)

## 2018-07-18 LAB — URINE CULTURE
MICRO NUMBER:: 91024370
SPECIMEN QUALITY:: ADEQUATE

## 2018-07-18 MED ORDER — METRONIDAZOLE 500 MG PO TABS: 500.0000 mg | ORAL_TABLET | Freq: Three times a day (TID) | ORAL | 0 refills | Status: AC

## 2018-07-18 NOTE — Addendum Note (Signed)
Addended by: Vicie Mutters R on: 07/18/2018 05:52 AM   Modules accepted: Orders

## 2018-07-27 ENCOUNTER — Ambulatory Visit (INDEPENDENT_AMBULATORY_CARE_PROVIDER_SITE_OTHER): Payer: PPO | Admitting: Orthopaedic Surgery

## 2018-07-30 DIAGNOSIS — L989 Disorder of the skin and subcutaneous tissue, unspecified: Secondary | ICD-10-CM | POA: Diagnosis not present

## 2018-07-30 DIAGNOSIS — D485 Neoplasm of uncertain behavior of skin: Secondary | ICD-10-CM | POA: Diagnosis not present

## 2018-07-30 HISTORY — PX: CYST REMOVAL HAND: SHX6279

## 2018-07-31 ENCOUNTER — Encounter (INDEPENDENT_AMBULATORY_CARE_PROVIDER_SITE_OTHER): Payer: Self-pay | Admitting: Orthopaedic Surgery

## 2018-07-31 ENCOUNTER — Ambulatory Visit (INDEPENDENT_AMBULATORY_CARE_PROVIDER_SITE_OTHER): Payer: PPO

## 2018-07-31 ENCOUNTER — Ambulatory Visit (INDEPENDENT_AMBULATORY_CARE_PROVIDER_SITE_OTHER): Payer: PPO | Admitting: Orthopaedic Surgery

## 2018-07-31 VITALS — BP 148/77 | HR 72 | Ht 67.5 in | Wt 246.0 lb

## 2018-07-31 DIAGNOSIS — G8929 Other chronic pain: Secondary | ICD-10-CM

## 2018-07-31 DIAGNOSIS — M545 Low back pain: Secondary | ICD-10-CM | POA: Diagnosis not present

## 2018-07-31 DIAGNOSIS — M170 Bilateral primary osteoarthritis of knee: Secondary | ICD-10-CM

## 2018-07-31 NOTE — Progress Notes (Signed)
Office Visit Note   Patient: [name]           Date of Birth: January 19, 1946           MRN: [id number] Visit Date: 07/31/2018              Requested by: Unk Pinto, [address], [place] [id number] PCP: Unk Pinto, MD   Assessment & Plan: Visit Diagnoses:  1. Chronic midline low back pain, with sciatica presence unspecified   2. Bilateral primary osteoarthritis of knee     Plan: Advanced osteoarthritis of both knees.  Will pre-certify Visco supplementation.  MRI scan of lumbar spine.  I think she is experiencing possible claudication contributing to her bilateral knee pain.  Office visit over 30 minutes 50% of the time and counseling regarding different treatment options for both her knees as her lumbar spine pending results of the MRI scan  Follow-Up Instructions: Return after MRI L-S spine.   Orders:  Orders Placed This Encounter  Procedures  . XR Lumbar Spine 2-3 Views   No orders of the defined types were placed in this encounter.     Procedures: No procedures performed   Clinical Data: No additional findings.   Subjective: Chief Complaint  Patient presents with  . Follow-up    BACK PAIN FOR 3 WEEKS AGO NOT TAKING ANYTHING, ALSO HAS PAIN IN THIGH MUSCLES. HAD INJECTIONS N KNEE 05/2018  [name] has been followed for the advanced osteoarthritis of both knees.  She is had cortisone injection with some temporary relief.  She has been taking over-the-counter medicines.  She recently has had an exacerbation of chronic low back pain.  She is had some discomfort in her thighs which may or may not be related.  She had a recent injury and may which also may account for some of her back pain although it was later in onset after her injury.  She has not had any numbness or tingling.  There is some thigh burning and "soreness".  He denies any bowel or bladder dysfunction.  She has not had any functional loss  HPI  Review of Systems    Constitutional: Negative for fatigue and fever.  HENT: Negative for ear pain.   Eyes: Negative for pain.  Respiratory: Negative for cough and shortness of breath.   Cardiovascular: Positive for leg swelling.  Gastrointestinal: Positive for constipation and diarrhea.  Genitourinary: Negative for difficulty urinating.  Musculoskeletal: Positive for back pain. Negative for neck pain.  Skin: Negative for rash.  Allergic/Immunologic: Negative for food allergies.  Neurological: Positive for weakness.  Hematological: Does not bruise/bleed easily.  Psychiatric/Behavioral: Negative for sleep disturbance.     Objective: Vital Signs: BP (!) 148/77 (BP Location: Left Arm, Patient Position: Sitting, Cuff Size: Normal)   Pulse 72   Ht 5' 7.5" (1.715 m)   Wt 246 lb (111.6 kg)   BMI 37.96 kg/m   Physical Exam  Constitutional: She is oriented to person, place, and time. She appears well-developed and well-nourished.  HENT:  Mouth/Throat: Oropharynx is clear and moist.  Eyes: Pupils are equal, round, and reactive to light. EOM are normal.  Pulmonary/Chest: Effort normal.  Neurological: She is alert and oriented to person, place, and time.  Skin: Skin is warm and dry.  Psychiatric: She has a normal mood and affect. Her behavior is normal.    Ortho Exam awake alert and oriented x3.  Comfortable sitting and standing and straight leg raise negative.  Some percussible tenderness of the lumbar spine distally.  No pain over the lateral aspect of either hips.  No pain with range of motion of either hip.  Knee exam is unchanged from prior evaluations.  +1 pulses motor exam intact to both feet  Specialty Comments:  No specialty comments available.  Imaging: Xr Lumbar Spine 2-3 Views  Result Date: 07/31/2018 Films lumbar spine were obtained in 2 projections.  On the lateral there is  slight anterior listhesis of L4 on L5.  Diffuse facet joint changes from L3-S1.  Some calcification of the abdominal  aorta without obvious aneurysmal dilatation.  Sacroiliac joints appear to be intact.  There is a degenerative scoliosis    PMFS History: Patient Active Problem List   Diagnosis Date Noted  . Bilateral primary osteoarthritis of knee 06/12/2018  . Toe gangrene ([place]) 04/02/2018  . History of basal cell carcinoma (BCC) 08/10/2017  . Hypertension 02/27/2017  . Chronic venous insufficiency 06/02/2015  . CKD (chronic kidney disease) stage 2, GFR 60-89 ml/min 06/02/2015  . Peripheral autonomic neuropathy of unknown cause 06/02/2015  . Morbid obesity ([place]) 08/19/2014  . Hypothyroid   . Anemia   . Diabetes mellitus type 2, controlled, with complications ([place])   . History of tobacco use 04/09/2012  . Hyperlipidemia   . GERD 05/04/2009  . Diverticulosis of large intestine 05/04/2009   Past Medical History:  Diagnosis Date  . Anemia   . Chest tightness   . Fatigue   . GERD (gastroesophageal reflux disease)   . Hyperlipidemia   . Hypothyroid   . Internal hemorrhoids 05/04/2009   Qualifier: Diagnosis of  By: Nelson-Smith CMA (AAMA), Dottie    . Myalgia   . Obesity   . Prediabetes   . Ulcer   . Varicose veins     Family History  Problem Relation Age of Onset  . Coronary artery disease Father 62    Past Surgical History:  Procedure Laterality Date  . ABDOMINAL HYSTERECTOMY    . CATARACT EXTRACTION    . CYST REMOVAL HAND Left 07/30/2018  . NASAL SINUS SURGERY    . ROTATOR CUFF REPAIR Left more than 7 years ago   Dr. Percell Miller and Dr. Earleen Newport  . TONSILLECTOMY AND ADENOIDECTOMY     Social History   Occupational History  . Occupation: [employer]    Employer: [employer]  Tobacco Use  . Smoking status: Former Smoker    Packs/day: 0.50    Years: 46.00    Pack years: 23.00    Types: Cigarettes    Last attempt to quit: 05/27/2013    Years since quitting: 5.1  . Smokeless tobacco: Never Used  Substance and Sexual Activity  . Alcohol use: Not Currently  . Drug use: Not  Currently  . Sexual activity: Not on file

## 2018-08-06 ENCOUNTER — Encounter: Payer: Self-pay | Admitting: Physician Assistant

## 2018-08-06 ENCOUNTER — Other Ambulatory Visit: Payer: Self-pay | Admitting: Physician Assistant

## 2018-08-06 ENCOUNTER — Ambulatory Visit (HOSPITAL_COMMUNITY)
Admission: RE | Admit: 2018-08-06 | Discharge: 2018-08-06 | Disposition: A | Discharge status: Home / Self Care | Payer: PPO | Source: Ambulatory Visit | Attending: Physician Assistant | Admitting: Physician Assistant

## 2018-08-06 ENCOUNTER — Ambulatory Visit (INDEPENDENT_AMBULATORY_CARE_PROVIDER_SITE_OTHER): Payer: PPO | Admitting: Physician Assistant

## 2018-08-06 VITALS — BP 138/74 | HR 77 | Temp 98.1°F | Ht 67.5 in | Wt 241.0 lb

## 2018-08-06 DIAGNOSIS — I700 Atherosclerosis of aorta: Secondary | ICD-10-CM | POA: Insufficient documentation

## 2018-08-06 DIAGNOSIS — E118 Type 2 diabetes mellitus with unspecified complications: Secondary | ICD-10-CM | POA: Diagnosis not present

## 2018-08-06 DIAGNOSIS — I872 Venous insufficiency (chronic) (peripheral): Secondary | ICD-10-CM | POA: Diagnosis not present

## 2018-08-06 DIAGNOSIS — M79675 Pain in left toe(s): Secondary | ICD-10-CM | POA: Diagnosis not present

## 2018-08-06 DIAGNOSIS — I10 Essential (primary) hypertension: Secondary | ICD-10-CM

## 2018-08-06 DIAGNOSIS — L089 Local infection of the skin and subcutaneous tissue, unspecified: Secondary | ICD-10-CM

## 2018-08-06 DIAGNOSIS — M7989 Other specified soft tissue disorders: Secondary | ICD-10-CM | POA: Insufficient documentation

## 2018-08-06 DIAGNOSIS — G9009 Other idiopathic peripheral autonomic neuropathy: Secondary | ICD-10-CM

## 2018-08-06 DIAGNOSIS — E785 Hyperlipidemia, unspecified: Secondary | ICD-10-CM

## 2018-08-06 DIAGNOSIS — K219 Gastro-esophageal reflux disease without esophagitis: Secondary | ICD-10-CM

## 2018-08-06 MED ORDER — DOXYCYCLINE HYCLATE 100 MG PO CAPS: ORAL_CAPSULE | ORAL | 0 refills | Status: DC

## 2018-08-06 MED ORDER — FLUCONAZOLE 150 MG PO TABS: 150.0000 mg | ORAL_TABLET | Freq: Every day | ORAL | 3 refills | Status: DC

## 2018-08-06 NOTE — Progress Notes (Signed)
Assessment and Plan:  Toe infection -     DG Toe 2nd Left; Future Will get Xray to rule out osteomyelitis Follow up podiatry -     doxycycline (VIBRAMYCIN) 100 MG capsule; Take 1 capsule twice daily with food -     fluconazole (DIFLUCAN) 150 MG tablet; Take 1 tablet (150 mg total) by mouth daily.  Controlled type 2 diabetes mellitus with complication, without long-term current use of insulin ([place]) Discussed general issues about diabetes pathophysiology and management., Educational material distributed., Suggested low cholesterol diet., Encouraged aerobic exercise., Discussed foot care., Reminded to get yearly retinal exam.  Peripheral autonomic neuropathy of unknown cause Will get Xray to rule out osteomyelitis  Morbid obesity ([place]) - follow up for progress monitoring - increase veggies, decrease carbs - long discussion about weight loss, diet, and exercise  Atherosclerosis of aorta (HCC) Control blood pressure, cholesterol, glucose, increase exercise.      HPI 72 y.o.female with history of DM with neuropathy, vascular insuff, presents with left foot redness x 1 week. States progressively getting worse, had drainage below her nail x Saturday. Has been putting hydrogen peroxide and neosporin.  Saw podiatry in May. Had normal ABI and CTA chest/AB recently. She denies fever, chills.   Past Medical History:  Diagnosis Date  . Anemia   . Chest tightness   . Fatigue   . GERD (gastroesophageal reflux disease)   . Hyperlipidemia   . Hypothyroid   . Internal hemorrhoids 05/04/2009   Qualifier: Diagnosis of  By: Nelson-Smith CMA (AAMA), Dottie    . Myalgia   . Obesity   . Prediabetes   . Ulcer   . Varicose veins      Allergies  Allergen Reactions  . Macrobid [Nitrofurantoin Macrocrystal]   . Penicillins     REACTION: rash  . Procaine Hcl     REACTION: difficulty breathing  . Sulfonamide Derivatives     REACTION: hives  . Tetanus Toxoid     REACTION: arm swells     Current Outpatient Medications on File Prior to Visit  Medication Sig  . Acetaminophen (TYLENOL ARTHRITIS PAIN PO) Take by mouth.  Marland Kitchen aspirin 81 MG tablet Take 81 mg by mouth daily.  Marland Kitchen b complex vitamins tablet Take 1 tablet by mouth daily.  . dorzolamide-timolol (COSOPT) 22.3-6.8 MG/ML ophthalmic solution USE 1 DROP INTO THE AFFECTED EYE 2 TIMES DAILY  . erythromycin ophthalmic ointment Apply 1 cm ribbon in affected eye(s) up to 2 times a day  . gabapentin (NEURONTIN) 800 MG tablet Take 1 tablet (800 mg total) by mouth 3 (three) times daily.  Marland Kitchen levothyroxine (SYNTHROID, LEVOTHROID) 75 MCG tablet Take 1 tablet 3 days a week and 1.5 tablets 4 days a week or as directed.  . meloxicam (MOBIC) 7.5 MG tablet TAKE 1 TABLET DAILY WITH   FOOD FOR PAIN CAN TAKE A SECOND PILL LATER IN THE DAY IF YOU NEED IT  . Multiple Vitamin (MULTIVITAMIN) tablet Take 1 tablet by mouth daily.  . traMADol (ULTRAM) 50 MG tablet Take 1 tab up to every 6 hours as needed for severe pain.  Marland Kitchen dicyclomine (BENTYL) 20 MG tablet Take 1 tablet (20 mg total) by mouth 3 (three) times daily as needed for spasms.   No current facility-administered medications on file prior to visit.     ROS: all negative except above.   Physical Exam: Filed Weights   08/06/18 1449  Weight: 241 lb (109.3 kg)   BP 138/74   Pulse  77   Temp 98.1 F (36.7 C)   Ht 5' 7.5" (1.715 m)   Wt 241 lb (109.3 kg)   SpO2 98%   BMI 37.19 kg/m  General Appearance: Well nourished, in no apparent distress. Eyes: PERRLA, EOMs, conjunctiva no swelling or erythema Sinuses: No Frontal/maxillary tenderness ENT/Mouth: Ext aud canals clear, TMs without erythema, bulging. No erythema, swelling, or exudate on post pharynx.  Tonsils not swollen or erythematous. Hearing normal.  Neck: Supple, thyroid normal.  Respiratory: Respiratory effort normal, BS equal bilaterally without rales, rhonchi, wheezing or stridor.  Cardio: RRR with no MRGs. Brisk pulses  bilateral TP/DP with bilateral 1+ edema and venous stasis dermatitis.  Abdomen: Soft, + BS,  Non tender, no guarding, rebound, hernias, masses. Lymphatics: Non tender without lymphadenopathy.  Musculoskeletal: Full ROM, 5/5 strength, Normal gait Skin: 2nd left toe with 5 mm horizontal wound that is 3-4 mm deep+, tender, warm, erythematous.  Warm, dry without rashes, lesions, ecchymosis.     Neuro: Cranial nerves intact. Normal muscle tone, no cerebellar symptoms. Psych: Awake and oriented X 3, normal affect, Insight and Judgment appropriate.    Vicie Mutters, PA-C 3:18 PM [facility]

## 2018-08-06 NOTE — Patient Instructions (Addendum)
INFORMATION ABOUT YOUR XRAY  [facility] [place], go to radiology and give them your name. They will have the order and take you back. You do not any paper work, I should get the result back today or tomorrow. This order is good for a year.   Elevate foot Keep dry At night can put vaseline on it  Call the podiatrist Office [phone]   Fingertip Infection BUT yours is your toe There are two main types of fingertip infections:  Paronychia. This is an infection that happens around your nail. This type of infection can start suddenly in one nail or occur gradually over time and affect more than one nail. Long-term (chronic) paronychia can make your fingernails thick and deformed.  Felon. This is a bacterial infection in the padded tip of your finger. Felon infection can cause a painful collection of pus (abscess) to form inside your fingertip. If the infection is not treated, the infection can spread as deep as the bone.  What are the causes? Paronychia infection can be caused by bacteria, funguses, or a mix of both. Felon infection is usually caused by the bacteria that are normally found on your skin. An infection can develop if the bacteria spread through your skin to the pad of tissue inside your fingertip. What increases the risk? A fingertip infection is more likely to develop in people:  Who have diabetes.  Who have a weak body defense (immune) system.  Who work with their hands.  Whose hands are exposed to moisture, chemicals, or irritants for long periods of time.  Who have poor circulation.  Who bite, chew, or pick their fingernails.  What are the signs or symptoms? Symptoms of paronychia may affect one or more fingernails and include:  Pain, swelling, and redness around the nail.  Pus-filled pockets at the base or side of the fingernail (cuticle).  Thick fingernails that separate from the nail bed.  Pus that drains from the nail bed.  Symptoms  of felon usually affect just one fingertip pad and include:  Severe, throbbing pain.  Redness.  Swelling.  Warmth.  Tenderness when the affected fingertip is touched.  How is this diagnosed? A fingertip infection is diagnosed with a medical history and physical exam. If there is pus draining from the infection, it may be swabbed and sent to the lab for a culture. An X-ray may be done to see if the infection has spread to the bone. How is this treated? Treatment for a fingertip infection may include:  Warm water or salt-water soaks several times per day.  Antibiotic medicine. This may be an ointment or pills.  Steroid ointment.  Antifungal pills.  Drainage of pus pockets. This is done by making a surgical cut (incision) to open the fingertip to drain pus.  Wearing gloves to protect your nails.  Follow these instructions at home: Medicines  Take or apply over-the-counter and prescription medicines only as told by your health care provider.  If you were prescribed antibiotic medicine, take or apply it as told by your health care provider. Do not stop using the antibiotic even if your condition improves. Wound care  Clean the infected area each day with warm water or salt water, or as told by your health care provider. ? Gently wash the infected area with mild soap and water. ? Rinse the infected area with water to remove all soap. ? Pat the infected area dry with a clean towel. Do not rub it. ?  To make a salt-water mixture, completely dissolve -1 tsp of salt in 1 cup of warm water.  Follow instructions from your health care provider about: ? How to take care of the infection. ? When and how you should change your bandage (dressing). ? When you should remove your dressing.  Check the infected area every day for more signs of infection. Watch for: ? More redness, swelling, or pain. ? More fluid or blood. ? Warmth. ? A bad smell. General instructions  Keep the  dressing dry until your health care provider says it can be removed. Do not take baths, swim, use a hot tub, or do anything that would put your wound underwater until your health care provider approves.  Raise (elevate) the infected area above the level of your heart while you are sitting or lying down or as told by your health care provider.  Do not scratch or pick at the infection.  Wear gloves as told by your health care provider, if this applies.  Keep all follow-up visits as told by your health care provider. This is important. How is this prevented?  Wear gloves when you work with your hands.  Wash your hands often with antibacterial soap.  Avoid letting your hands stay wet or irritated for long periods of time.  Do not bite your fingernails. Do not pull on your cuticles. Do not suck on your fingers.  Use clean scissors or nail clippers to trim your nails. Do not cut your fingernails very short. Contact a health care provider if:  Your pain medicine is not helping.  You have more redness, swelling, or pain at your fingertip.  You continue to have fluid, blood, or pus coming from your fingertip.  Your infection area feels warm to the touch.  You continue to notice a bad smell coming from your fingertip or your dressing. Get help right away if:  The area of redness is spreading, or you notice a red streak going away from your fingertip.  You have a fever. This information is not intended to replace advice given to you by your health care provider. Make sure you discuss any questions you have with your health care provider. Document Released: 12/15/2004 Document Revised: 04/14/2016 Document Reviewed: 04/27/2015 Elsevier Interactive Patient Education  2018 [employer].

## 2018-08-07 ENCOUNTER — Telehealth: Payer: Self-pay | Admitting: Internal Medicine

## 2018-08-07 ENCOUNTER — Other Ambulatory Visit: Payer: Self-pay | Admitting: Physician Assistant

## 2018-08-07 DIAGNOSIS — M86172 Other acute osteomyelitis, left ankle and foot: Secondary | ICD-10-CM

## 2018-08-07 NOTE — Telephone Encounter (Signed)
Patient states she is scheduled with Dr Durward Fortes 08-20-18. Did you want her to get imaging on her Left foot 2ndtoe before this ortho appt?

## 2018-08-07 NOTE — Addendum Note (Signed)
Addended by: Vicie Mutters R on: 08/07/2018 09:20 AM   Modules accepted: Orders

## 2018-08-09 ENCOUNTER — Encounter: Payer: Self-pay | Admitting: Podiatry

## 2018-08-09 ENCOUNTER — Ambulatory Visit: Payer: PPO | Admitting: Podiatry

## 2018-08-09 DIAGNOSIS — L97521 Non-pressure chronic ulcer of other part of left foot limited to breakdown of skin: Secondary | ICD-10-CM | POA: Diagnosis not present

## 2018-08-09 MED ORDER — MUPIROCIN 2 % EX OINT: TOPICAL_OINTMENT | CUTANEOUS | 1 refills | Status: DC

## 2018-08-09 NOTE — Progress Notes (Signed)
She presents today second toe left states that the toe is red and sore and has been for the past little while had x-rays done by her primary care provider concerned about osteomyelitis and is taking doxycycline.  Objective: Vital signs are stable alert and oriented x3.  Pulses are palpable.  Neurologic sensorium is considerably diminished.  Pulses remain palpable deep tendon reflexes are not elicitable muscle strength is normal symmetrical.  Toe deformity second left has resulted in breakdown of the skin and the nail which is resulted in infection.  I was able to remove the nail today and debride all necrotic tissue there was the area was not probable to bone.  Radiographs reviewed that were in the epic system which demonstrated gas I think was only air but this does not appear to be a gas infection  Assessment cellulitis second toe left.  Plan: Continue doxycycline start Bactroban ointment toenail removal today debridement of ulceration follow-up with her in 1 week dispensed a Darco shoe.

## 2018-08-12 NOTE — Progress Notes (Signed)
CPE  Assessment and Plan:   Essential hypertension - continue medications, DASH diet, exercise and monitor at home. Call if greater than 130/80.  -     CBC with Differential/Platelet -     COMPLETE METABOLIC PANEL WITH GFR -     TSH -     Urinalysis, Routine w reflex microscopic -     Microalbumin / creatinine urine ratio -     EKG 12-Lead  Atherosclerosis of aorta (HCC) Control blood pressure, cholesterol, glucose, increase exercise.   Controlled type 2 diabetes mellitus with complication, without long-term current use of insulin ([place]) Discussed general issues about diabetes pathophysiology and management., Educational material distributed., Suggested low cholesterol diet., Encouraged aerobic exercise., Discussed foot care., Reminded to get yearly retinal exam. -     Hemoglobin A1c  Hypothyroidism, unspecified type Hypothyroidism-check TSH level, continue medications the same, reminded to take on an empty stomach 30-71mins before food.  -     TSH  Gastroesophageal reflux disease with esophagitis Continue PPI/H2 blocker, diet discussed  Chronic venous insufficiency Weight loss advised, compression socks  Peripheral autonomic neuropathy of unknown cause -     Cane adjustable wide base quad  History of basal cell carcinoma (BCC) Continue derm follow up  CKD (chronic kidney disease) stage 2, GFR 60-89 ml/min Increase fluids, avoid NSAIDS, monitor sugars, will monitor -     COMPLETE METABOLIC PANEL WITH GFR  Diverticulosis of large intestine without hemorrhage Increase fiber  Morbid obesity ([place]) - follow up 3 months for progress monitoring - increase veggies, decrease carbs - long discussion about weight loss, diet, and exercise -     Cane adjustable wide base quad  Hyperlipidemia, unspecified hyperlipidemia type check lipids decrease fatty foods increase activity.  -     Lipid panel  Anemia, unspecified type - monitor, continue iron supp with Vitamin C and  increase green leafy veggies  History of tobacco use Monitor  Toe gangrene (HCC) Finish doxy, continue follow up Dr. Milinda Pointer.   Needs flu shot -     Flu vaccine HIGH DOSE PF  Medication management -     Magnesium  Vitamin D deficiency -     VITAMIN D 25 Hydroxy (Vit-D Deficiency, Fractures)   Continue diet and meds as discussed. Further disposition pending results of labs. Over 30 minutes of exam, counseling, chart review, and critical decision making was performed  Future Appointments  Date Time Provider Accident  08/16/2018  9:45 AM Garrel Ridgel, DPM [facility]  08/16/2018  3:50 PM [facility] MR 3 [facility] [address] [facility]  08/20/2018  3:30 PM Garald Balding, MD [facility] None  04/02/2019  3:45 PM Vicie Mutters, PA-C [id number] None    HPI 72 y.o. female  presents for CPE and 3 month follow up on hypertension, cholesterol, prediabetes, and vitamin D deficiency.   She was seen recently for possible osteomyelitis of her toe, started on doxy, had OV with Dr. Milinda Pointer and had debridement of her left second toe.   She is having a hard time standing from a chair right now and is having some imbalance with her back pain and her left toe with neuropathy and would benefit from a cane.   Her blood pressure has been controlled at home, has been good, today their BP is BP: 120/80  She does workout. She denies chest pain, shortness of breath, dizziness.   She is on cholesterol medication and denies myalgias. Her cholesterol is at goal. The cholesterol last visit  was:   Lab Results  Component Value Date   CHOL 145 03/28/2018   HDL 43 (L) 03/28/2018   LDLCALC 71 03/28/2018   TRIG 223 (H) 03/28/2018   CHOLHDL 3.4 03/28/2018   She has been working on diet and exercise for Diabetes with diabetic chronic kidney disease, she is on bASA, she is on ACE/ARB, she is off estrogen now, she has idiopathic paraesthsias and is on lyrica/elavil and denies paresthesia of the feet,  polydipsia, polyuria and visual disturbances. Last A1C was:  Lab Results  Component Value Date   HGBA1C 6.1 (H) 03/28/2018   Patient is on Vitamin D supplement.   Lab Results  Component Value Date   VD25OH 46 07/26/2016     BMI is Body mass index is 37.03 kg/m., she is working on diet and exercise. Wt Readings from Last 3 Encounters:  08/14/18 240 lb (108.9 kg)  08/06/18 241 lb (109.3 kg)  07/31/18 246 lb (111.6 kg)   She is on thyroid medication. Her medication was not changed last visit.   Lab Results  Component Value Date   TSH 3.06 04/19/2018  .   Current Medications:  Current Outpatient Medications on File Prior to Visit  Medication Sig Dispense Refill  . Acetaminophen (TYLENOL ARTHRITIS PAIN PO) Take by mouth.    Marland Kitchen aspirin 81 MG tablet Take 81 mg by mouth daily.    Marland Kitchen atorvastatin (LIPITOR) 40 MG tablet Take 1 tablet by mouth at bedtime for cholesterol 90 tablet 0  . b complex vitamins tablet Take 1 tablet by mouth daily.    Marland Kitchen DILT-XR 120 MG 24 hr capsule Take 1 capsule by mouth daily 90 capsule 1  . dorzolamide-timolol (COSOPT) 22.3-6.8 MG/ML ophthalmic solution USE 1 DROP INTO THE AFFECTED EYE 2 TIMES DAILY  1  . erythromycin ophthalmic ointment Apply 1 cm ribbon in affected eye(s) up to 2 times a day 1 g 0  . fluconazole (DIFLUCAN) 150 MG tablet Take 1 tablet (150 mg total) by mouth daily. 1 tablet 3  . furosemide (LASIX) 20 MG tablet Take 1 tablet by mouth twice a day 180 tablet 0  . gabapentin (NEURONTIN) 800 MG tablet Take 1 tablet (800 mg total) by mouth 3 (three) times daily. 90 tablet 3  . levothyroxine (SYNTHROID, LEVOTHROID) 75 MCG tablet Take 1 tablet 3 days a week and 1.5 tablets 4 days a week or as directed. 114 tablet 1  . meloxicam (MOBIC) 7.5 MG tablet TAKE 1 TABLET DAILY WITH   FOOD FOR PAIN CAN TAKE A SECOND PILL LATER IN THE DAY IF YOU NEED IT 180 tablet 1  . Multiple Vitamin (MULTIVITAMIN) tablet Take 1 tablet by mouth daily.    . mupirocin ointment  (BACTROBAN) 2 % Apply to wound after soaking BID 30 g 1  . pantoprazole (PROTONIX) 20 MG tablet Take 1 tablet by mouth daily 90 tablet 1  . dicyclomine (BENTYL) 20 MG tablet Take 1 tablet (20 mg total) by mouth 3 (three) times daily as needed for spasms. 30 tablet 0  . doxycycline (VIBRAMYCIN) 100 MG capsule Take 1 capsule twice daily with food (Patient not taking: Reported on 08/14/2018) 20 capsule 0   No current facility-administered medications on file prior to visit.    Medical History:  Past Medical History:  Diagnosis Date  . Anemia   . Chest tightness   . Fatigue   . GERD (gastroesophageal reflux disease)   . Hyperlipidemia   . Hypothyroid   . Internal  hemorrhoids 05/04/2009   Qualifier: Diagnosis of  By: Nelson-Smith CMA (AAMA), Dottie    . Myalgia   . Obesity   . Prediabetes   . Ulcer   . Varicose veins    Health Maintenance:   Immunization History  Administered Date(s) Administered  . Influenza, High Dose Seasonal PF 08/19/2014, 09/10/2015, 07/26/2016, 08/10/2017  . Pneumococcal Conjugate-13 05/12/2014  . Pneumococcal Polysaccharide-23 09/05/2007, 06/02/2015    Tetanus: ALLERGY Pneumovax: 2016 Prevnar 2015 Flu vaccine: 2018 today Zostavax: N/A will check with insurance  Pap: 02/2011 normal declines another MGM: 08/2016 had diagnostic MGM right breast normal, need repeat screening DEXA: 07/2012 normal Colonoscopy: 2011  Due 2021 EGD: n/a CT AB 2011 Heart cath June 1999, no blockages, coronary spasms  [facility]  Dr. Nicki Reaper for eye care, checking for glaucoma, goes yearly, has OV next week Patient Care Team: Unk Pinto, MD as PCP - General (Internal Medicine) Macarthur Critchley, [place] as Referring Physician (Optometry) Vicie Mutters, PA-C as Referring Physician (Physician Assistant)  Allergies Allergies  Allergen Reactions  . Ciprofloxacin   . Macrobid [Nitrofurantoin Macrocrystal]   . Penicillins     REACTION: rash  . Procaine Hcl      REACTION: difficulty breathing  . Sulfonamide Derivatives     REACTION: hives  . Tetanus Toxoid     REACTION: arm swells    SURGICAL HISTORY She  has a past surgical history that includes Abdominal hysterectomy; Tonsillectomy and adenoidectomy; Cataract extraction; Nasal sinus surgery; Rotator cuff repair (Left, more than 7 years ago); and Cyst removal hand (Left, 07/30/2018). FAMILY HISTORY Her family history includes Coronary artery disease (age of onset: 61) in her father. SOCIAL HISTORY She  reports that she quit smoking about 5 years ago. Her smoking use included cigarettes. She has a 23.00 pack-year smoking history. She has never used smokeless tobacco. She reports that she drank alcohol. She reports that she has current or past drug history.   Review of Systems:  Review of Systems  Constitutional: Negative.   HENT: Negative.   Eyes: Negative.   Respiratory: Negative.   Cardiovascular: Negative.   Gastrointestinal: Negative.   Genitourinary: Negative.   Musculoskeletal: Negative.   Skin: Negative.   Neurological: Negative.   Endo/Heme/Allergies: Negative.   Psychiatric/Behavioral: Negative.     Physical Exam: BP 120/80   Pulse 76   Temp 98 F (36.7 C)   Resp 14   Wt 240 lb (108.9 kg)   SpO2 99%   BMI 37.03 kg/m  Wt Readings from Last 3 Encounters:  08/14/18 240 lb (108.9 kg)  08/06/18 241 lb (109.3 kg)  07/31/18 246 lb (111.6 kg)   General Appearance: Well nourished, in no apparent distress. Eyes: PERRLA, EOMs, conjunctiva no swelling or erythema Sinuses: No Frontal/maxillary tenderness ENT/Mouth: Ext aud canals clear, TMs without erythema, bulging. No erythema, swelling, or exudate on post pharynx.  Tonsils not swollen or erythematous. Hearing normal.  Neck: Supple, thyroid normal.  Respiratory: Respiratory effort normal, BS equal bilaterally without rales, rhonchi, wheezing or stridor.  Cardio: RRR with no MRGs. Brisk peripheral pulses with mild edema.   Abdomen: Soft, + BS,  Non tender, no guarding, rebound, hernias, masses. Lymphatics: Non tender without lymphadenopathy.  Musculoskeletal: Full ROM, 5/5 strength, antalgic gait, left foot in a boot.  Skin:  Warm, dry without rashes, lesions, ecchymosis.  Neuro: Cranial nerves intact. Normal muscle tone, no cerebellar symptoms. Psych: Awake and oriented X 3, normal affect, Insight and Judgment appropriate.  Vicie Mutters, PA-C 10:06 AM [facility]

## 2018-08-14 ENCOUNTER — Ambulatory Visit (INDEPENDENT_AMBULATORY_CARE_PROVIDER_SITE_OTHER): Payer: PPO | Admitting: Physician Assistant

## 2018-08-14 ENCOUNTER — Encounter: Payer: Self-pay | Admitting: Physician Assistant

## 2018-08-14 VITALS — BP 120/80 | HR 76 | Temp 98.0°F | Resp 14 | Wt 240.0 lb

## 2018-08-14 DIAGNOSIS — D649 Anemia, unspecified: Secondary | ICD-10-CM

## 2018-08-14 DIAGNOSIS — K5730 Diverticulosis of large intestine without perforation or abscess without bleeding: Secondary | ICD-10-CM

## 2018-08-14 DIAGNOSIS — Z87891 Personal history of nicotine dependence: Secondary | ICD-10-CM

## 2018-08-14 DIAGNOSIS — Z136 Encounter for screening for cardiovascular disorders: Secondary | ICD-10-CM | POA: Diagnosis not present

## 2018-08-14 DIAGNOSIS — E559 Vitamin D deficiency, unspecified: Secondary | ICD-10-CM

## 2018-08-14 DIAGNOSIS — E785 Hyperlipidemia, unspecified: Secondary | ICD-10-CM | POA: Diagnosis not present

## 2018-08-14 DIAGNOSIS — G9009 Other idiopathic peripheral autonomic neuropathy: Secondary | ICD-10-CM

## 2018-08-14 DIAGNOSIS — N182 Chronic kidney disease, stage 2 (mild): Secondary | ICD-10-CM

## 2018-08-14 DIAGNOSIS — Z23 Encounter for immunization: Secondary | ICD-10-CM | POA: Diagnosis not present

## 2018-08-14 DIAGNOSIS — K2100 Gastro-esophageal reflux disease with esophagitis, without bleeding: Secondary | ICD-10-CM

## 2018-08-14 DIAGNOSIS — I700 Atherosclerosis of aorta: Secondary | ICD-10-CM

## 2018-08-14 DIAGNOSIS — E118 Type 2 diabetes mellitus with unspecified complications: Secondary | ICD-10-CM | POA: Diagnosis not present

## 2018-08-14 DIAGNOSIS — Z85828 Personal history of other malignant neoplasm of skin: Secondary | ICD-10-CM

## 2018-08-14 DIAGNOSIS — E039 Hypothyroidism, unspecified: Secondary | ICD-10-CM

## 2018-08-14 DIAGNOSIS — I872 Venous insufficiency (chronic) (peripheral): Secondary | ICD-10-CM

## 2018-08-14 DIAGNOSIS — I10 Essential (primary) hypertension: Secondary | ICD-10-CM

## 2018-08-14 DIAGNOSIS — Z0000 Encounter for general adult medical examination without abnormal findings: Secondary | ICD-10-CM

## 2018-08-14 DIAGNOSIS — Z79899 Other long term (current) drug therapy: Secondary | ICD-10-CM

## 2018-08-14 DIAGNOSIS — I96 Gangrene, not elsewhere classified: Secondary | ICD-10-CM

## 2018-08-14 NOTE — Patient Instructions (Addendum)
Check out  Mini habits for weight loss book  2 apps for tracking food is myfitness pal  loseit OR can take picture of your food  Check out mindful eating     When it comes to diets, agreement about the perfect plan isn't easy to find, even among the experts. Experts at the [employer] developed an idea known as the Healthy Eating Plate. Just imagine a plate divided into logical, healthy portions.  The emphasis is on diet quality:  Load up on vegetables and fruits - one-half of your plate: Aim for color and variety, and remember that potatoes don't count.  Go for whole grains - one-quarter of your plate: Whole wheat, barley, wheat berries, quinoa, oats, brown rice, and foods made with them. If you want pasta, go with whole wheat pasta.  Protein power - one-quarter of your plate: Fish, chicken, beans, and nuts are all healthy, versatile protein sources. Limit red meat.  The diet, however, does go beyond the plate, offering a few other suggestions.  Use healthy plant oils, such as olive, canola, soy, corn, sunflower and peanut. Check the labels, and avoid partially hydrogenated oil, which have unhealthy trans fats.  If you're thirsty, drink water. Coffee and tea are good in moderation, but skip sugary drinks and limit milk and dairy products to one or two daily servings.  The type of carbohydrate in the diet is more important than the amount. Some sources of carbohydrates, such as vegetables, fruits, whole grains, and beans-are healthier than others.  Finally, stay active.     Bad carbs also include fruit juice, alcohol, and sweet tea. These are empty calories that do not signal to your brain that you are full.   Please remember the good carbs are still carbs which convert into sugar. So please measure them out no more than 1/2-1 cup of rice, oatmeal, pasta, and beans  Veggies are however free foods! Pile them on.   Not all fruit is created equal. Please see  the list below, the fruit at the bottom is higher in sugars than the fruit at the top. Please avoid all dried fruits.

## 2018-08-14 NOTE — Progress Notes (Signed)
Sorry about that...  flu has been entered.

## 2018-08-15 LAB — CBC WITH DIFFERENTIAL/PLATELET
BASOS PCT: 0.7 %
Basophils Absolute: 74 cells/uL (ref 0–200)
Eosinophils Absolute: 200 cells/uL (ref 15–500)
Eosinophils Relative: 1.9 %
HEMATOCRIT: 42 % (ref 35.0–45.0)
Hemoglobin: 14 g/dL (ref 11.7–15.5)
Lymphs Abs: 3192 cells/uL (ref 850–3900)
MCH: 28.3 pg (ref 27.0–33.0)
MCHC: 33.3 g/dL (ref 32.0–36.0)
MCV: 84.8 fL (ref 80.0–100.0)
MPV: 10.7 fL (ref 7.5–12.5)
Monocytes Relative: 6.5 %
NEUTROS PCT: 60.5 %
Neutro Abs: 6353 cells/uL (ref 1500–7800)
PLATELETS: 328 10*3/uL (ref 140–400)
RBC: 4.95 10*6/uL (ref 3.80–5.10)
RDW: 14.1 % (ref 11.0–15.0)
TOTAL LYMPHOCYTE: 30.4 %
WBC mixed population: 683 cells/uL (ref 200–950)
WBC: 10.5 10*3/uL (ref 3.8–10.8)

## 2018-08-15 LAB — HEMOGLOBIN A1C
EAG (MMOL/L): 7.4 (calc)
HEMOGLOBIN A1C: 6.3 %{Hb} — AB (ref <5.7)
Mean Plasma Glucose: 134 (calc)

## 2018-08-15 LAB — COMPLETE METABOLIC PANEL WITH GFR
AG RATIO: 1.7 (calc) (ref 1.0–2.5)
ALT: 22 U/L (ref 6–29)
AST: 19 U/L (ref 10–35)
Albumin: 4.2 g/dL (ref 3.6–5.1)
Alkaline phosphatase (APISO): 104 U/L (ref 33–130)
BUN/Creatinine Ratio: 25 (calc) — ABNORMAL HIGH (ref 6–22)
BUN: 30 mg/dL — ABNORMAL HIGH (ref 7–25)
CALCIUM: 9.9 mg/dL (ref 8.6–10.4)
CO2: 28 mmol/L (ref 20–32)
Chloride: 105 mmol/L (ref 98–110)
Creat: 1.18 mg/dL — ABNORMAL HIGH (ref 0.60–0.93)
GFR, EST AFRICAN AMERICAN: 54 mL/min/{1.73_m2} — AB (ref >=60)
GFR, EST NON AFRICAN AMERICAN: 47 mL/min/{1.73_m2} — AB (ref >=60)
GLOBULIN: 2.5 g/dL (ref 1.9–3.7)
Glucose, Bld: 93 mg/dL (ref 65–99)
POTASSIUM: 4.6 mmol/L (ref 3.5–5.3)
SODIUM: 142 mmol/L (ref 135–146)
TOTAL PROTEIN: 6.7 g/dL (ref 6.1–8.1)
Total Bilirubin: 0.6 mg/dL (ref 0.2–1.2)

## 2018-08-15 LAB — URINALYSIS, ROUTINE W REFLEX MICROSCOPIC
Bilirubin Urine: NEGATIVE
GLUCOSE, UA: NEGATIVE
HGB URINE DIPSTICK: NEGATIVE
KETONES UR: NEGATIVE
Leukocytes, UA: NEGATIVE
Nitrite: NEGATIVE
PH: 5.5 (ref 5.0–8.0)
Protein, ur: NEGATIVE
Specific Gravity, Urine: 1.011 (ref 1.001–1.03)

## 2018-08-15 LAB — MICROALBUMIN / CREATININE URINE RATIO
Creatinine, Urine: 47 mg/dL (ref 20–275)
MICROALB/CREAT RATIO: 6 ug/mg{creat} (ref <30)
Microalb, Ur: 0.3 mg/dL

## 2018-08-15 LAB — LIPID PANEL
Cholesterol: 150 mg/dL (ref <200)
HDL: 47 mg/dL — ABNORMAL LOW (ref >50)
LDL Cholesterol (Calc): 76 mg/dL (calc)
NON-HDL CHOLESTEROL (CALC): 103 mg/dL (ref <130)
Total CHOL/HDL Ratio: 3.2 (calc) (ref <5.0)
Triglycerides: 167 mg/dL — ABNORMAL HIGH (ref <150)

## 2018-08-15 LAB — VITAMIN D 25 HYDROXY (VIT D DEFICIENCY, FRACTURES): Vit D, 25-Hydroxy: 40 ng/mL (ref 30–100)

## 2018-08-15 LAB — TSH: TSH: 1.05 mIU/L (ref 0.40–4.50)

## 2018-08-15 LAB — MAGNESIUM: MAGNESIUM: 2.2 mg/dL (ref 1.5–2.5)

## 2018-08-16 ENCOUNTER — Other Ambulatory Visit: Payer: PPO

## 2018-08-16 ENCOUNTER — Encounter: Payer: Self-pay | Admitting: Physician Assistant

## 2018-08-16 ENCOUNTER — Encounter: Payer: Self-pay | Admitting: Podiatry

## 2018-08-16 ENCOUNTER — Ambulatory Visit (INDEPENDENT_AMBULATORY_CARE_PROVIDER_SITE_OTHER): Payer: PPO | Admitting: Podiatry

## 2018-08-16 DIAGNOSIS — L97521 Non-pressure chronic ulcer of other part of left foot limited to breakdown of skin: Secondary | ICD-10-CM | POA: Diagnosis not present

## 2018-08-16 MED ORDER — DOXYCYCLINE HYCLATE 100 MG PO TABS: 100.0000 mg | ORAL_TABLET | Freq: Two times a day (BID) | ORAL | 0 refills | Status: DC

## 2018-08-16 NOTE — Progress Notes (Signed)
She presents today for follow-up of ulceration second digit of the left foot.  States that seems to be doing so much better.  Objective: Vital signs stable she is alert and oriented x3.  No erythema edema saline strange odor reactive hyperkeratosis debrided today no bleeding.  Noticed that the nail appears to be loose.  Assessment: Well-healing hammertoe deformity with distal clavus and ulceration.  Plan: Debrided the area today and also recommended a sulcus pad.  Follow-up with her in about a month she is to continue to wear her Darco shoe.

## 2018-08-20 ENCOUNTER — Ambulatory Visit (INDEPENDENT_AMBULATORY_CARE_PROVIDER_SITE_OTHER): Payer: PPO | Admitting: Orthopaedic Surgery

## 2018-09-04 ENCOUNTER — Ambulatory Visit: Payer: PPO | Admitting: Podiatry

## 2018-09-04 ENCOUNTER — Encounter: Payer: Self-pay | Admitting: Podiatry

## 2018-09-04 DIAGNOSIS — L97521 Non-pressure chronic ulcer of other part of left foot limited to breakdown of skin: Secondary | ICD-10-CM | POA: Diagnosis not present

## 2018-09-04 DIAGNOSIS — E0843 Diabetes mellitus due to underlying condition with diabetic autonomic (poly)neuropathy: Secondary | ICD-10-CM

## 2018-09-04 NOTE — Progress Notes (Signed)
She presents today for follow-up of ulceration second toe left foot.  She states is looking much better now that have been wearing the buttress pad.  She still continues to wear her Darco shoe.  Denies fever chills nausea vomiting muscle aches and pains states that she was still wraps the toe daily.  Objective: Vital signs are stable comes in today with the toe wrapped in a green Covan and states that it is feeling much better when I evaluated there is no erythema cellulitis drainage or odor I did debride reactive hyperkeratosis distally there is no open wound.  Assessment: Well-healing ulceration second digit left foot with hammertoe.  Plan: Distributed another buttress pad for the contralateral foot I will follow-up with her in about a month to make sure she is doing well with her regular shoe gear.

## 2018-09-18 DIAGNOSIS — H40013 Open angle with borderline findings, low risk, bilateral: Secondary | ICD-10-CM | POA: Diagnosis not present

## 2018-09-24 ENCOUNTER — Telehealth (INDEPENDENT_AMBULATORY_CARE_PROVIDER_SITE_OTHER): Payer: Self-pay | Admitting: *Deleted

## 2018-09-24 ENCOUNTER — Ambulatory Visit (INDEPENDENT_AMBULATORY_CARE_PROVIDER_SITE_OTHER): Payer: PPO | Admitting: Orthopaedic Surgery

## 2018-09-24 ENCOUNTER — Encounter (INDEPENDENT_AMBULATORY_CARE_PROVIDER_SITE_OTHER): Payer: Self-pay | Admitting: Orthopaedic Surgery

## 2018-09-24 VITALS — BP 114/60 | HR 55 | Ht 67.0 in | Wt 240.0 lb

## 2018-09-24 DIAGNOSIS — M170 Bilateral primary osteoarthritis of knee: Secondary | ICD-10-CM

## 2018-09-24 NOTE — Progress Notes (Signed)
Office Visit Note   Patient: [name]           Date of Birth: 04-14-1946           MRN: [id number] Visit Date: 09/24/2018              Requested by: Unk Pinto, [address] [place], [place] [address] PCP: Unk Pinto, MD   Assessment & Plan: Visit Diagnoses:  1. Bilateral primary osteoarthritis of knee     Plan: We will pre-CERT for Visco supplementation and give her prescription for either single or four-point cane.  Return to initiate Visco supplementations injections when they are available  Follow-Up Instructions: Return will pre cert visco.   Orders:  No orders of the defined types were placed in this encounter.  No orders of the defined types were placed in this encounter.     Procedures: No procedures performed   Clinical Data: No additional findings.   Subjective: Chief Complaint  Patient presents with  . Follow-up    06/12/18 HAD BI LAT KNEE INJECTIONS THAT DID NOT SEEM TO HELP  [name] has a history of bilateral knee osteoarthritis that is fairly advanced.  She is had cortisone in the past with some partial relief and temporary relief of her pain.  We had discussed Visco supplementation in the past and she would like to "try it".  On occasion she feels like her knees may want to "throw her" and she like to have a prescription for a cane either single a four-point.  Does have a history of diabetes with some neuropathy.  Also notes that she weighs 240 pounds and is trying to lose weight  HPI  Review of Systems  Constitutional: Negative for fatigue and fever.  HENT: Negative for ear pain.   Eyes: Negative for pain.  Respiratory: Negative for cough and shortness of breath.   Cardiovascular: Positive for leg swelling.  Gastrointestinal: Negative for constipation and diarrhea.  Genitourinary: Negative for difficulty urinating.  Musculoskeletal: Negative for back pain and neck pain.  Skin: Negative for rash.    Allergic/Immunologic: Negative for food allergies.  Neurological: Positive for weakness and numbness.  Hematological: Does not bruise/bleed easily.  Psychiatric/Behavioral: Negative for sleep disturbance.     Objective: Vital Signs: BP 114/60 (BP Location: Left Arm, Patient Position: Sitting, Cuff Size: Normal)   Pulse (!) 55   Ht 5\' 7"  (1.702 m)   Wt 240 lb (108.9 kg)   BMI 37.59 kg/m   Physical Exam  Constitutional: She is oriented to person, place, and time. She appears well-developed and well-nourished.  HENT:  Mouth/Throat: Oropharynx is clear and moist.  Eyes: Pupils are equal, round, and reactive to light. EOM are normal.  Pulmonary/Chest: Effort normal.  Neurological: She is alert and oriented to person, place, and time.  Skin: Skin is warm and dry.  Psychiatric: She has a normal mood and affect. Her behavior is normal.    Ortho Exam awake alert and oriented x3 comfortable sitting.  Large legs.  Full extension about 100 degrees of flexion without instability.  May be small effusion both knees.  No patellar discomfort skin intact.  Some altered sensibility of her feet.  Good capillary refill  Specialty Comments:  No specialty comments available.  Imaging: No results found.   PMFS History: Patient Active Problem List   Diagnosis Date Noted  . Atherosclerosis of aorta ([place]) 08/06/2018  . Bilateral primary osteoarthritis of knee 06/12/2018  . Toe gangrene (  [place]) 04/02/2018  . History of basal cell carcinoma (BCC) 08/10/2017  . Hypertension 02/27/2017  . Chronic venous insufficiency 06/02/2015  . CKD (chronic kidney disease) stage 2, GFR 60-89 ml/min 06/02/2015  . Peripheral autonomic neuropathy of unknown cause 06/02/2015  . Morbid obesity ([place]) 08/19/2014  . Hypothyroid   . Anemia   . Diabetes mellitus type 2, controlled, with complications ([place])   . History of tobacco use 04/09/2012  . Hyperlipidemia   . GERD 05/04/2009  . Diverticulosis of large intestine  05/04/2009   Past Medical History:  Diagnosis Date  . Anemia   . Chest tightness   . Fatigue   . GERD (gastroesophageal reflux disease)   . Hyperlipidemia   . Hypothyroid   . Internal hemorrhoids 05/04/2009   Qualifier: Diagnosis of  By: Nelson-Smith CMA (AAMA), Dottie    . Myalgia   . Obesity   . Prediabetes   . Ulcer   . Varicose veins     Family History  Problem Relation Age of Onset  . Coronary artery disease Father 2    Past Surgical History:  Procedure Laterality Date  . ABDOMINAL HYSTERECTOMY    . CATARACT EXTRACTION    . CYST REMOVAL HAND Left 07/30/2018  . NASAL SINUS SURGERY    . ROTATOR CUFF REPAIR Left more than 7 years ago   Dr. Percell Miller and Dr. Earleen Newport  . TONSILLECTOMY AND ADENOIDECTOMY     Social History   Occupational History  . Occupation: [employer]    Employer: [employer]  Tobacco Use  . Smoking status: Former Smoker    Packs/day: 0.50    Years: 46.00    Pack years: 23.00    Types: Cigarettes    Last attempt to quit: 05/27/2013    Years since quitting: 5.3  . Smokeless tobacco: Never Used  Substance and Sexual Activity  . Alcohol use: Not Currently  . Drug use: Not Currently  . Sexual activity: Not on file

## 2018-09-24 NOTE — Telephone Encounter (Signed)
Please apply for visco bil knees for Dr. Durward Fortes. Thank you.

## 2018-09-27 NOTE — Telephone Encounter (Signed)
Noted  

## 2018-10-02 ENCOUNTER — Ambulatory Visit: Payer: PPO | Admitting: Podiatry

## 2018-10-09 ENCOUNTER — Telehealth (INDEPENDENT_AMBULATORY_CARE_PROVIDER_SITE_OTHER): Payer: Self-pay

## 2018-10-09 NOTE — Telephone Encounter (Signed)
Submitted VOB for Orthovisc series, bilateral knee. 

## 2018-10-16 ENCOUNTER — Ambulatory Visit: Payer: PPO | Admitting: Podiatry

## 2018-10-16 ENCOUNTER — Encounter: Payer: Self-pay | Admitting: Podiatry

## 2018-10-16 ENCOUNTER — Telehealth (INDEPENDENT_AMBULATORY_CARE_PROVIDER_SITE_OTHER): Payer: Self-pay

## 2018-10-16 DIAGNOSIS — E0843 Diabetes mellitus due to underlying condition with diabetic autonomic (poly)neuropathy: Secondary | ICD-10-CM | POA: Diagnosis not present

## 2018-10-16 DIAGNOSIS — L97521 Non-pressure chronic ulcer of other part of left foot limited to breakdown of skin: Secondary | ICD-10-CM

## 2018-10-16 NOTE — Telephone Encounter (Signed)
Please schedule patient an appointment with Dr. Durward Fortes for gel injection.  Thank You.  Patient is approved for Orthovisc series, bilateral knee. [name] Patient will be responsible for 20% OOP. $20.00 Co-pay, possibly each visit No PA required

## 2018-10-17 NOTE — Progress Notes (Signed)
She presents today for follow-up of ulcer second digit of her left foot.  She states is completely better.  Objective: Vital signs are stable alert and oriented x3.  There is no erythema edema cellulitis drainage or odor.  Distal clavus which was an ulceration is going to heal uneventfully I did not have anything to debride.  Assessment: Well-healing ulcerative toe second left.  Plan: Follow-up with [place] on an as-needed basis.  She will follow-up with Dr. Elisha Ponder for routine nail debridement.

## 2018-10-24 ENCOUNTER — Ambulatory Visit (INDEPENDENT_AMBULATORY_CARE_PROVIDER_SITE_OTHER): Payer: PPO | Admitting: Orthopedic Surgery

## 2018-10-25 DIAGNOSIS — Z1231 Encounter for screening mammogram for malignant neoplasm of breast: Secondary | ICD-10-CM | POA: Diagnosis not present

## 2018-10-25 LAB — HM MAMMOGRAPHY

## 2018-10-26 ENCOUNTER — Other Ambulatory Visit: Payer: Self-pay | Admitting: Internal Medicine

## 2018-10-29 ENCOUNTER — Encounter: Payer: Self-pay | Admitting: *Deleted

## 2018-10-30 ENCOUNTER — Ambulatory Visit: Payer: PPO | Admitting: Podiatry

## 2018-10-31 ENCOUNTER — Ambulatory Visit (INDEPENDENT_AMBULATORY_CARE_PROVIDER_SITE_OTHER): Payer: PPO | Admitting: Orthopedic Surgery

## 2018-11-01 ENCOUNTER — Ambulatory Visit: Payer: PPO | Admitting: Podiatry

## 2018-11-07 ENCOUNTER — Ambulatory Visit: Payer: PPO | Admitting: Podiatry

## 2018-11-07 ENCOUNTER — Ambulatory Visit (INDEPENDENT_AMBULATORY_CARE_PROVIDER_SITE_OTHER): Payer: PPO | Admitting: Orthopaedic Surgery

## 2018-11-07 DIAGNOSIS — G609 Hereditary and idiopathic neuropathy, unspecified: Secondary | ICD-10-CM | POA: Diagnosis not present

## 2018-11-07 DIAGNOSIS — M79675 Pain in left toe(s): Secondary | ICD-10-CM

## 2018-11-07 DIAGNOSIS — M79674 Pain in right toe(s): Secondary | ICD-10-CM

## 2018-11-07 DIAGNOSIS — L84 Corns and callosities: Secondary | ICD-10-CM | POA: Diagnosis not present

## 2018-11-07 DIAGNOSIS — B351 Tinea unguium: Secondary | ICD-10-CM

## 2018-11-07 NOTE — Patient Instructions (Signed)
Diabetes Mellitus and Foot Care Foot care is an important part of your health, especially when you have diabetes. Diabetes may cause you to have problems because of poor blood flow (circulation) to your feet and legs, which can cause your skin to:  Become thinner and drier.  Break more easily.  Heal more slowly.  Peel and crack. You may also have nerve damage (neuropathy) in your legs and feet, causing decreased feeling in them. This means that you may not notice minor injuries to your feet that could lead to more serious problems. Noticing and addressing any potential problems early is the best way to prevent future foot problems. How to care for your feet Foot hygiene  Wash your feet daily with warm water and mild soap. Do not use hot water. Then, pat your feet and the areas between your toes until they are completely dry. Do not soak your feet as this can dry your skin.  Trim your toenails straight across. Do not dig under them or around the cuticle. File the edges of your nails with an emery board or nail file.  Apply a moisturizing lotion or petroleum jelly to the skin on your feet and to dry, brittle toenails. Use lotion that does not contain alcohol and is unscented. Do not apply lotion between your toes. Shoes and socks  Wear clean socks or stockings every day. Make sure they are not too tight. Do not wear knee-high stockings since they may decrease blood flow to your legs.  Wear shoes that fit properly and have enough cushioning. Always look in your shoes before you put them on to be sure there are no objects inside.  To break in new shoes, wear them for just a few hours a day. This prevents injuries on your feet. Wounds, scrapes, corns, and calluses  Check your feet daily for blisters, cuts, bruises, sores, and redness. If you cannot see the bottom of your feet, use a mirror or ask someone for help.  Do not cut corns or calluses or try to remove them with medicine.  If you  find a minor scrape, cut, or break in the skin on your feet, keep it and the skin around it clean and dry. You may clean these areas with mild soap and water. Do not clean the area with peroxide, alcohol, or iodine.  If you have a wound, scrape, corn, or callus on your foot, look at it several times a day to make sure it is healing and not infected. Check for: ? Redness, swelling, or pain. ? Fluid or blood. ? Warmth. ? Pus or a bad smell. General instructions  Do not cross your legs. This may decrease blood flow to your feet.  Do not use heating pads or hot water bottles on your feet. They may burn your skin. If you have lost feeling in your feet or legs, you may not know this is happening until it is too late.  Protect your feet from hot and cold by wearing shoes, such as at the beach or on hot pavement.  Schedule a complete foot exam at least once a year (annually) or more often if you have foot problems. If you have foot problems, report any cuts, sores, or bruises to your health care provider immediately. Contact a health care provider if:  You have a medical condition that increases your risk of infection and you have any cuts, sores, or bruises on your feet.  You have an injury that is not   healing.  You have redness on your legs or feet.  You feel burning or tingling in your legs or feet.  You have pain or cramps in your legs and feet.  Your legs or feet are numb.  Your feet always feel cold.  You have pain around a toenail. Get help right away if:  You have a wound, scrape, corn, or callus on your foot and: ? You have pain, swelling, or redness that gets worse. ? You have fluid or blood coming from the wound, scrape, corn, or callus. ? Your wound, scrape, corn, or callus feels warm to the touch. ? You have pus or a bad smell coming from the wound, scrape, corn, or callus. ? You have a fever. ? You have a red line going up your leg. Summary  Check your feet every day  for cuts, sores, red spots, swelling, and blisters.  Moisturize feet and legs daily.  Wear shoes that fit properly and have enough cushioning.  If you have foot problems, report any cuts, sores, or bruises to your health care provider immediately.  Schedule a complete foot exam at least once a year (annually) or more often if you have foot problems. This information is not intended to replace advice given to you by your health care provider. Make sure you discuss any questions you have with your health care provider. Document Released: 11/04/2000 Document Revised: 12/20/2017 Document Reviewed: 12/09/2016 Elsevier Interactive Patient Education  2019 [employer]  Onychomycosis/Fungal Toenails  WHAT IS IT? An infection that lies within the keratin of your nail plate that is caused by a fungus.  WHY ME? Fungal infections affect all ages, sexes, races, and creeds.  There may be many factors that predispose you to a fungal infection such as age, coexisting medical conditions such as diabetes, or an autoimmune disease; stress, medications, fatigue, genetics, etc.  Bottom line: fungus thrives in a warm, moist environment and your shoes offer such a location.  IS IT CONTAGIOUS? Theoretically, yes.  You do not want to share shoes, nail clippers or files with someone who has fungal toenails.  Walking around barefoot in the same room or sleeping in the same bed is unlikely to transfer the organism.  It is important to realize, however, that fungus can spread easily from one nail to the next on the same foot.  HOW DO WE TREAT THIS?  There are several ways to treat this condition.  Treatment may depend on many factors such as age, medications, pregnancy, liver and kidney conditions, etc.  It is best to ask your doctor which options are available to you.  1. No treatment.   Unlike many other medical concerns, you can live with this condition.  However for many people this can be a painful condition and  may lead to ingrown toenails or a bacterial infection.  It is recommended that you keep the nails cut short to help reduce the amount of fungal nail. 2. Topical treatment.  These range from herbal remedies to prescription strength nail lacquers.  About 40-50% effective, topicals require twice daily application for approximately 9 to 12 months or until an entirely new nail has grown out.  The most effective topicals are medical grade medications available through physicians offices. 3. Oral antifungal medications.  With an 80-90% cure rate, the most common oral medication requires 3 to 4 months of therapy and stays in your system for a year as the new nail grows out.  Oral antifungal medications do require   blood work to make sure it is a safe drug for you.  A liver function panel will be performed prior to starting the medication and after the first month of treatment.  It is important to have the blood work performed to avoid any harmful side effects.  In general, this medication safe but blood work is required. 4. Laser Therapy.  This treatment is performed by applying a specialized laser to the affected nail plate.  This therapy is noninvasive, fast, and non-painful.  It is not covered by insurance and is therefore, out of pocket.  The results have been very good with a 80-95% cure rate.  [facility] is the only practice in the area to offer this therapy. 5. Permanent Nail Avulsion.  Removing the entire nail so that a new nail will not grow back. 

## 2018-11-12 DIAGNOSIS — D1801 Hemangioma of skin and subcutaneous tissue: Secondary | ICD-10-CM | POA: Diagnosis not present

## 2018-11-12 DIAGNOSIS — D225 Melanocytic nevi of trunk: Secondary | ICD-10-CM | POA: Diagnosis not present

## 2018-11-12 DIAGNOSIS — L821 Other seborrheic keratosis: Secondary | ICD-10-CM | POA: Diagnosis not present

## 2018-11-12 DIAGNOSIS — L720 Epidermal cyst: Secondary | ICD-10-CM | POA: Diagnosis not present

## 2018-11-12 DIAGNOSIS — Z85828 Personal history of other malignant neoplasm of skin: Secondary | ICD-10-CM | POA: Diagnosis not present

## 2018-11-16 ENCOUNTER — Other Ambulatory Visit: Payer: Self-pay | Admitting: Physician Assistant

## 2018-11-16 DIAGNOSIS — E785 Hyperlipidemia, unspecified: Secondary | ICD-10-CM

## 2018-11-16 DIAGNOSIS — I10 Essential (primary) hypertension: Secondary | ICD-10-CM

## 2018-11-27 ENCOUNTER — Ambulatory Visit: Payer: Self-pay | Admitting: Internal Medicine

## 2018-12-02 ENCOUNTER — Encounter: Payer: Self-pay | Admitting: Internal Medicine

## 2018-12-02 NOTE — Progress Notes (Signed)
R  E  S  C  H  E  D  U  L  E  D     & Needs 3 mo OV /Ashley &  52mo CPE/Wellness w/Amanda                                                                                                                                                                                                                                                                                                                                                                                                                                                                             This very nice 73 y.o.female presents for  follow up with labile HTN, HLD, Pre-Diabetes and Vitamin D Deficiency.      Patient is followed expectantly for labile HTN & BP has been controlled at home. Today's  . Patient has had no complaints of any cardiac type chest pain, palpitations, dyspnea / orthopnea / PND, dizziness, claudication, or dependent edema.     Hyperlipidemia is controlled with diet & meds. Patient denies myalgias or other med SE's. Last Lipids were at goal albeit elevated Trig's: Lab Results  Component Value Date   CHOL 150 08/14/2018  HDL 47 (L) 08/14/2018   LDLCALC 76 08/14/2018   TRIG 167 (H) 08/14/2018   CHOLHDL 3.2 08/14/2018      Also, the patient has Morbid Obesity ([place] 37.5) and history of T2_NIDDM managed with diet and has had no symptoms of reactive hypoglycemia, diabetic polys, paresthesias or visual blurring.  Last A1c was nor at goal: Lab Results  Component Value Date    HGBA1C 6.3 (H) 08/14/2018      Further, the patient also has history of Vitamin D Deficiency and supplements vitamin D sporadically without any suspected side-effects. Last vitamin D was low: Lab Results  Component Value Date   VD25OH 40 08/14/2018   Current Outpatient Medications on File Prior to Visit  Medication Sig  . Acetaminophen (TYLENOL ARTHRITIS PAIN PO) Take by mouth.  Marland Kitchen aspirin 81 MG tablet Take 81 mg by mouth daily.  Marland Kitchen atorvastatin (LIPITOR) 40 MG tablet Take 1 tablet by mouth at bedtime for cholesterol  . b complex vitamins tablet Take 1 tablet by mouth daily.  Marland Kitchen dicyclomine (BENTYL) 20 MG tablet Take 1 tablet (20 mg total) by mouth 3 (three) times daily as needed for spasms.  Marland Kitchen DILT-XR 120 MG 24 hr capsule Take 1 capsule by mouth daily  . dorzolamide-timolol (COSOPT) 22.3-6.8 MG/ML ophthalmic solution USE 1 DROP INTO THE AFFECTED EYE 2 TIMES DAILY  . erythromycin ophthalmic ointment Apply 1 cm ribbon in affected eye(s) up to 2 times a day  . furosemide (LASIX) 20 MG tablet Take 1 tablet by mouth twice a day  . gabapentin (NEURONTIN) 800 MG tablet Take 1 tablet (800 mg total) by mouth 3 (three) times daily.  Marland Kitchen levothyroxine (SYNTHROID, LEVOTHROID) 75 MCG tablet Take 1 tablet by mouth 3 days a week and Take 1 & 1/2 tablets by mouth 4 days a week or as directed  . meloxicam (MOBIC) 7.5 MG tablet TAKE 1 TABLET DAILY WITH   FOOD FOR PAIN CAN TAKE A SECOND PILL LATER IN THE DAY IF YOU NEED IT  . Multiple Vitamin (MULTIVITAMIN) tablet Take 1 tablet by mouth daily.  . mupirocin ointment (BACTROBAN) 2 % Apply to wound after soaking BID  . pantoprazole (PROTONIX) 20 MG tablet Take 1 tablet by mouth daily   No current facility-administered medications on file prior to visit.    Allergies  Allergen Reactions  . Ciprofloxacin   . Macrobid [Nitrofurantoin Macrocrystal]   . Penicillins     REACTION: rash  . Procaine Hcl     REACTION: difficulty breathing  . Sulfonamide  Derivatives     REACTION: hives  . Tetanus Toxoid     REACTION: arm swells   PMHx:   Past Medical History:  Diagnosis Date  . Anemia   . Chest tightness   . Fatigue   . GERD (gastroesophageal reflux disease)   . Hyperlipidemia   . Hypothyroid   . Internal hemorrhoids 05/04/2009   Qualifier: Diagnosis of  By: Nelson-Smith CMA (AAMA), Dottie    . Myalgia   . Obesity   . Prediabetes   . Ulcer   . Varicose veins    Immunization History  Administered Date(s) Administered  . Influenza, High Dose Seasonal PF 08/19/2014, 09/10/2015, 07/26/2016, 08/10/2017, 08/14/2018  . Pneumococcal Conjugate-13 05/12/2014  . Pneumococcal Polysaccharide-23 09/05/2007, 06/02/2015   Past Surgical History:  Procedure Laterality Date  . ABDOMINAL HYSTERECTOMY    . CATARACT EXTRACTION    . CYST REMOVAL HAND Left 07/30/2018  . NASAL SINUS SURGERY    .  ROTATOR CUFF REPAIR Left more than 7 years ago   Dr. Percell Miller and Dr. Earleen Newport  . TONSILLECTOMY AND ADENOIDECTOMY     FHx:    Reviewed / unchanged  SHx:    Reviewed / unchanged   Systems Review:  Constitutional: Denies fever, chills, wt changes, headaches, insomnia, fatigue, night sweats, change in appetite. Eyes: Denies redness, blurred vision, diplopia, discharge, itchy, watery eyes.  ENT: Denies discharge, congestion, post nasal drip, epistaxis, sore throat, earache, hearing loss, dental pain, tinnitus, vertigo, sinus pain, snoring.  CV: Denies chest pain, palpitations, irregular heartbeat, syncope, dyspnea, diaphoresis, orthopnea, PND, claudication or edema. Respiratory: denies cough, dyspnea, DOE, pleurisy, hoarseness, laryngitis, wheezing.  Gastrointestinal: Denies dysphagia, odynophagia, heartburn, reflux, water brash, abdominal pain or cramps, nausea, vomiting, bloating, diarrhea, constipation, hematemesis, melena, hematochezia  or hemorrhoids. Genitourinary: Denies dysuria, frequency, urgency, nocturia, hesitancy, discharge, hematuria or flank  pain. Musculoskeletal: Denies arthralgias, myalgias, stiffness, jt. swelling, pain, limping or strain/sprain.  Skin: Denies pruritus, rash, hives, warts, acne, eczema or change in skin lesion(s). Neuro: No weakness, tremor, incoordination, spasms, paresthesia or pain. Psychiatric: Denies confusion, memory loss or sensory loss. Endo: Denies change in weight, skin or hair change.  Heme/Lymph: No excessive bleeding, bruising or enlarged lymph nodes.  Physical Exam  There were no vitals taken for this visit.  Appears  well nourished, well groomed  and in no distress.  Eyes: PERRLA, EOMs, conjunctiva no swelling or erythema. Sinuses: No frontal/maxillary tenderness ENT/Mouth: EAC's clear, TM's nl w/o erythema, bulging. Nares clear w/o erythema, swelling, exudates. Oropharynx clear without erythema or exudates. Oral hygiene is good. Tongue normal, non obstructing. Hearing intact.  Neck: Supple. Thyroid not palpable. Car 2+/2+ without bruits, nodes or JVD. Chest: Respirations nl with BS clear & equal w/o rales, rhonchi, wheezing or stridor.  Cor: Heart sounds normal w/ regular rate and rhythm without sig. murmurs, gallops, clicks or rubs. Peripheral pulses normal and equal  without edema.  Abdomen: Soft & bowel sounds normal. Non-tender w/o guarding, rebound, hernias, masses or organomegaly.  Lymphatics: Unremarkable.  Musculoskeletal: Full ROM all peripheral extremities, joint stability, 5/5 strength and normal gait.  Skin: Warm, dry without exposed rashes, lesions or ecchymosis apparent.  Neuro: Cranial nerves intact, reflexes equal bilaterally. Sensory-motor testing grossly intact. Tendon reflexes grossly intact.  Pysch: Alert & oriented x 3.  Insight and judgement nl & appropriate. No ideations.  Assessment and Plan:  - Continue medication, monitor blood pressure at home.  - Continue DASH diet.  Reminder to go to the ER if any CP,  SOB, nausea, dizziness, severe HA, changes  vision/speech.  - Continue diet/meds, exercise,& lifestyle modifications.  - Continue monitor periodic cholesterol/liver & renal functions   - Continue diet, exercise  - Lifestyle modifications.  - Monitor appropriate labs. - Continue supplementation.      Discussed  regular exercise, BP monitoring, weight control to achieve/maintain BMI less than 25 and discussed med and SE's. Recommended labs to assess and monitor clinical status with further disposition pending results of labs. Over 30 minutes of exam, counseling, chart review was performed.

## 2018-12-03 DIAGNOSIS — E559 Vitamin D deficiency, unspecified: Secondary | ICD-10-CM | POA: Insufficient documentation

## 2018-12-03 DIAGNOSIS — Z79899 Other long term (current) drug therapy: Secondary | ICD-10-CM | POA: Insufficient documentation

## 2018-12-04 ENCOUNTER — Ambulatory Visit: Payer: Self-pay | Admitting: Internal Medicine

## 2018-12-10 ENCOUNTER — Encounter: Payer: Self-pay | Admitting: Podiatry

## 2018-12-10 NOTE — Progress Notes (Signed)
Subjective: [name] presents today with history of neuropathy with cc of painful, mycotic toenails.  Pain is aggravated when wearing enclosed shoe gear and relieved with periodic professional debridement.  She has h/o ulceration of left 2nd digit, treated by Dr. Milinda Pointer,  which has now healed.  Patient has peripheral neuropathy managed with gabapentin.  Unk Pinto, MD is her PCP.   Current Outpatient Medications:  .  Acetaminophen (TYLENOL ARTHRITIS PAIN PO), Take by mouth., Disp: , Rfl:  .  aspirin 81 MG tablet, Take 81 mg by mouth daily., Disp: , Rfl:  .  b complex vitamins tablet, Take 1 tablet by mouth daily., Disp: , Rfl:  .  DILT-XR 120 MG 24 hr capsule, Take 1 capsule by mouth daily, Disp: 90 capsule, Rfl: 1 .  dorzolamide-timolol (COSOPT) 22.3-6.8 MG/ML ophthalmic solution, USE 1 DROP INTO THE AFFECTED EYE 2 TIMES DAILY, Disp: , Rfl: 1 .  erythromycin ophthalmic ointment, Apply 1 cm ribbon in affected eye(s) up to 2 times a day, Disp: 1 g, Rfl: 0 .  gabapentin (NEURONTIN) 800 MG tablet, Take 1 tablet (800 mg total) by mouth 3 (three) times daily., Disp: 90 tablet, Rfl: 3 .  levothyroxine (SYNTHROID, LEVOTHROID) 75 MCG tablet, Take 1 tablet by mouth 3 days a week and Take 1 & 1/2 tablets by mouth 4 days a week or as directed, Disp: 114 tablet, Rfl: 0 .  meloxicam (MOBIC) 7.5 MG tablet, TAKE 1 TABLET DAILY WITH   FOOD FOR PAIN CAN TAKE A SECOND PILL LATER IN THE DAY IF YOU NEED IT, Disp: 180 tablet, Rfl: 1 .  Multiple Vitamin (MULTIVITAMIN) tablet, Take 1 tablet by mouth daily., Disp: , Rfl:  .  mupirocin ointment (BACTROBAN) 2 %, Apply to wound after soaking BID, Disp: 30 g, Rfl: 1 .  pantoprazole (PROTONIX) 20 MG tablet, Take 1 tablet by mouth daily, Disp: 90 tablet, Rfl: 1 .  atorvastatin (LIPITOR) 40 MG tablet, Take 1 tablet by mouth at bedtime for cholesterol, Disp: 90 tablet, Rfl: 0 .  dicyclomine (BENTYL) 20 MG tablet, Take 1 tablet (20 mg total) by mouth 3 (three)  times daily as needed for spasms., Disp: 30 tablet, Rfl: 0 .  furosemide (LASIX) 20 MG tablet, Take 1 tablet by mouth twice a day, Disp: 180 tablet, Rfl: 0  Allergies  Allergen Reactions  . Ciprofloxacin   . Macrobid [Nitrofurantoin Macrocrystal]   . Penicillins     REACTION: rash  . Procaine Hcl     REACTION: difficulty breathing  . Sulfonamide Derivatives     REACTION: hives  . Tetanus Toxoid     REACTION: arm swells    Objective:  Vascular Examination: Capillary refill time immediate x 10 digits Dorsalis pedis and Posterior tibial pulses palpable b/l Digital hair x 10 digits was absent Skin temperature gradient WNL b/l  Dermatological Examination: Skin with normal turgor, texture and tone b/l  Toenails 1-5 b/l discolored, thick, dystrophic with subungual debris and pain with palpation to nailbeds due to thickness of nails.  Hyperkeratotic lesion distal tip left 2nd digit. This is the site of her previous ulceration. There is no flocculence, no edema, no erythema, no drainage nor impending wound formation.   Musculoskeletal: Muscle strength 5/5 to all muscle groups b/l  Neurological: Sensation with 10 gram monofilament is absent b/l Vibratory sensation absent b/l  Assessment: 1. Painful onychomycosis toenails 1-5 b/l 2. Corn left 2nd digit. 3. Peripheral neuropathy  Plan: 1. Toenails 1-5 b/l were debrided in  length and girth without iatrogenic bleeding. 2. Distal corn debrided left 2nd digit without complication. 3. Patient to continue soft, supportive shoe gear 4. Patient to report any pedal injuries to medical professional  5. Follow up 3 months. Patient/POA to call should there be a concern in the interim.

## 2018-12-11 ENCOUNTER — Encounter (INDEPENDENT_AMBULATORY_CARE_PROVIDER_SITE_OTHER): Payer: Self-pay | Admitting: Orthopaedic Surgery

## 2018-12-11 ENCOUNTER — Ambulatory Visit (INDEPENDENT_AMBULATORY_CARE_PROVIDER_SITE_OTHER): Payer: PPO | Admitting: Orthopaedic Surgery

## 2018-12-11 VITALS — BP 131/86 | HR 76

## 2018-12-11 DIAGNOSIS — M1711 Unilateral primary osteoarthritis, right knee: Secondary | ICD-10-CM | POA: Diagnosis not present

## 2018-12-11 DIAGNOSIS — M1712 Unilateral primary osteoarthritis, left knee: Secondary | ICD-10-CM | POA: Diagnosis not present

## 2018-12-11 MED ORDER — LIDOCAINE HCL 1 % IJ SOLN
2.0000 mL | INTRAMUSCULAR | Status: AC | PRN
  Administered 2018-12-11: 2 mL

## 2018-12-11 MED ORDER — HYALURONAN 30 MG/2ML IX SOSY
30.0000 mg | PREFILLED_SYRINGE | INTRA_ARTICULAR | Status: AC | PRN
  Administered 2018-12-11: 30 mg via INTRA_ARTICULAR

## 2018-12-11 NOTE — Progress Notes (Signed)
Office Visit Note   Patient: [name]           Date of Birth: 1945-12-03           MRN: [id number] Visit Date: 12/11/2018              Requested by: Unk Pinto, [facility] [address] [place], [place] [address] PCP: Unk Pinto, MD   Assessment & Plan: Visit Diagnoses:  1. Unilateral primary osteoarthritis, left knee   2. Unilateral primary osteoarthritis, right knee     Plan:  #1: Bilateral Orthovisc injections were given without difficulty to the knees.  Follow-Up Instructions: Return in about 1 week (around 12/18/2018).   Orders:  No orders of the defined types were placed in this encounter.  No orders of the defined types were placed in this encounter.     Procedures: Large Joint Inj: bilateral knee on 12/11/2018 3:14 PM Indications: pain and joint swelling Details: 25 G 1.5 in needle, anteromedial approach  Arthrogram: No  Medications (Right): 2 mL lidocaine 1 %; 30 mg Hyaluronan 30 MG/2ML Medications (Left): 2 mL lidocaine 1 %; 30 mg Hyaluronan 30 MG/2ML Outcome: tolerated well, no immediate complications Procedure, treatment alternatives, risks and benefits explained, specific risks discussed. Consent was given by the patient. Immediately prior to procedure a time out was called to verify the correct patient, procedure, equipment, support staff and site/side marked as required. Patient was prepped and draped in the usual sterile fashion.       Clinical Data: No additional findings.   Subjective: Chief Complaint  Patient presents with  . Right Knee - Follow-up, Pain  . Left Knee - Follow-up  . Knee Pain    1st orthovisc shot    HPI  [name] is a 73 year old white female with a history of bilateral knee arthritis which has been fairly advanced.  She was seen back on September 24, 2018 at that time she had corticosteroid injections given.  She has been precertified for Visco supplementation.  She comes in today for evaluation  and for the injections of Orthovisc.   Review of Systems  Constitutional: Negative.   HENT: Negative.   Eyes: Negative.   Respiratory: Negative.   Cardiovascular: Negative.   Gastrointestinal: Negative.   Endocrine: Negative.   Genitourinary: Negative.   Musculoskeletal: Positive for gait problem.  Skin: Negative.   Allergic/Immunologic: Negative.   Hematological: Negative.   Psychiatric/Behavioral: Negative.   All other systems reviewed and are negative.    Objective: Vital Signs: BP 131/86   Pulse 76   Physical Exam Constitutional:      Appearance: She is well-developed.  Eyes:     Pupils: Pupils are equal, round, and reactive to light.  Pulmonary:     Effort: Pulmonary effort is normal.  Skin:    General: Skin is warm and dry.  Neurological:     Mental Status: She is alert and oriented to person, place, and time.  Psychiatric:        Behavior: Behavior normal.     Ortho Exam  Exam today reveals range of motion from near full extension to 100 degrees of flexion.  She has trace effusion at best.  Some patellofemoral crepitance with range of motion.  Neurovascular intact distally.  Skin is intact without ecchymosis.  Specialty Comments:  No specialty comments available.  Imaging: No results found.   PMFS History: Current Outpatient Medications  Medication Sig Dispense Refill  . Acetaminophen (TYLENOL ARTHRITIS PAIN PO)  Take by mouth.    Marland Kitchen aspirin 81 MG tablet Take 81 mg by mouth daily.    Marland Kitchen atorvastatin (LIPITOR) 40 MG tablet Take 1 tablet by mouth at bedtime for cholesterol 90 tablet 0  . b complex vitamins tablet Take 1 tablet by mouth daily.    Marland Kitchen dicyclomine (BENTYL) 20 MG tablet Take 1 tablet (20 mg total) by mouth 3 (three) times daily as needed for spasms. 30 tablet 0  . DILT-XR 120 MG 24 hr capsule Take 1 capsule by mouth daily 90 capsule 1  . dorzolamide-timolol (COSOPT) 22.3-6.8 MG/ML ophthalmic solution USE 1 DROP INTO THE AFFECTED EYE 2 TIMES  DAILY  1  . erythromycin ophthalmic ointment Apply 1 cm ribbon in affected eye(s) up to 2 times a day 1 g 0  . furosemide (LASIX) 20 MG tablet Take 1 tablet by mouth twice a day 180 tablet 0  . gabapentin (NEURONTIN) 800 MG tablet Take 1 tablet (800 mg total) by mouth 3 (three) times daily. 90 tablet 3  . levothyroxine (SYNTHROID, LEVOTHROID) 75 MCG tablet Take 1 tablet by mouth 3 days a week and Take 1 & 1/2 tablets by mouth 4 days a week or as directed 114 tablet 0  . meloxicam (MOBIC) 7.5 MG tablet TAKE 1 TABLET DAILY WITH   FOOD FOR PAIN CAN TAKE A SECOND PILL LATER IN THE DAY IF YOU NEED IT 180 tablet 1  . Multiple Vitamin (MULTIVITAMIN) tablet Take 1 tablet by mouth daily.    . mupirocin ointment (BACTROBAN) 2 % Apply to wound after soaking BID 30 g 1  . pantoprazole (PROTONIX) 20 MG tablet Take 1 tablet by mouth daily 90 tablet 1   No current facility-administered medications for this visit.     Patient Active Problem List   Diagnosis Date Noted  . Vitamin D deficiency 12/03/2018  . Medication management 12/03/2018  . Atherosclerosis of aorta ([place]) 08/06/2018  . Bilateral primary osteoarthritis of knee 06/12/2018  . Toe gangrene ([place]) 04/02/2018  . History of basal cell carcinoma (BCC) 08/10/2017  . Labile hypertension 02/27/2017  . Chronic venous insufficiency 06/02/2015  . CKD (chronic kidney disease) stage 2, GFR 60-89 ml/min 06/02/2015  . Peripheral autonomic neuropathy of unknown cause 06/02/2015  . Class 2 severe obesity due to excess calories with serious comorbidity and body mass index (BMI) of 37.0 to 37.9 in adult ([place]) 08/19/2014  . Hypothyroid   . Anemia   . Diabetes mellitus type 2, controlled, with complications ([place])   . History of tobacco use 04/09/2012  . Hyperlipidemia, mixed   . GERD 05/04/2009  . Diverticulosis of large intestine 05/04/2009   Past Medical History:  Diagnosis Date  . Anemia   . Chest tightness   . Fatigue   . GERD (gastroesophageal  reflux disease)   . Hyperlipidemia   . Hypothyroid   . Internal hemorrhoids 05/04/2009   Qualifier: Diagnosis of  By: Nelson-Smith CMA (AAMA), Dottie    . Myalgia   . Obesity   . Prediabetes   . Ulcer   . Varicose veins     Family History  Problem Relation Age of Onset  . Coronary artery disease Father 95    Past Surgical History:  Procedure Laterality Date  . ABDOMINAL HYSTERECTOMY    . CATARACT EXTRACTION    . CYST REMOVAL HAND Left 07/30/2018  . NASAL SINUS SURGERY    . ROTATOR CUFF REPAIR Left more than 7 years ago   Dr. Percell Miller and  Dr. Earleen Newport  . TONSILLECTOMY AND ADENOIDECTOMY     Social History   Occupational History  . 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 Smoking status: Former Smoker    Packs/day: 0.50    Years: 46.00    Pack years: 23.00    Types: Cigarettes    Last attempt to quit: 05/27/2013    Years since quitting: 5.5  . Smokeless tobacco: Never Used  Substance and Sexual Activity  . Alcohol use: Not Currently  . Drug use: Not Currently  . Sexual activity: Not on file

## 2018-12-13 ENCOUNTER — Encounter: Payer: Self-pay | Admitting: Internal Medicine

## 2018-12-13 ENCOUNTER — Ambulatory Visit (INDEPENDENT_AMBULATORY_CARE_PROVIDER_SITE_OTHER): Payer: PPO | Admitting: Internal Medicine

## 2018-12-13 VITALS — BP 140/72 | HR 83 | Temp 98.9°F | Ht 67.0 in | Wt 239.6 lb

## 2018-12-13 DIAGNOSIS — K2100 Gastro-esophageal reflux disease with esophagitis, without bleeding: Secondary | ICD-10-CM

## 2018-12-13 DIAGNOSIS — E782 Mixed hyperlipidemia: Secondary | ICD-10-CM | POA: Diagnosis not present

## 2018-12-13 DIAGNOSIS — Z79899 Other long term (current) drug therapy: Secondary | ICD-10-CM

## 2018-12-13 DIAGNOSIS — I10 Essential (primary) hypertension: Secondary | ICD-10-CM | POA: Diagnosis not present

## 2018-12-13 DIAGNOSIS — E559 Vitamin D deficiency, unspecified: Secondary | ICD-10-CM | POA: Diagnosis not present

## 2018-12-13 DIAGNOSIS — E118 Type 2 diabetes mellitus with unspecified complications: Secondary | ICD-10-CM

## 2018-12-13 DIAGNOSIS — E039 Hypothyroidism, unspecified: Secondary | ICD-10-CM | POA: Diagnosis not present

## 2018-12-13 NOTE — Progress Notes (Signed)
This very nice 73 y.o. female presents for  follow up with labile HTN, HLD, Pre-Diabetes and Vitamin D Deficiency.                                        Patient is followed  for labile HTN & BP has been controlled at home. Today's BP is at upper limits of Normal - 140/72. Patient has had no complaints of any cardiac type chest pain, palpitations, dyspnea / orthopnea / PND, dizziness, claudication, or dependent edema.      Hyperlipidemia is controlled with diet & meds. Patient denies myalgias or other med SE's. Last Lipids were at goal: Lab Results  Component Value Date   CHOL 150 08/14/2018   HDL 47 (L) 08/14/2018   LDLCALC 76 08/14/2018   TRIG 167 (H) 08/14/2018   CHOLHDL 3.2 08/14/2018      Also, the patient has Morbid Obesity (BMI 37.5) and history of T2_NIDDM managed with diet and has had no symptoms of reactive hypoglycemia, diabetic polys, paresthesias or visual blurring.   Last A1c was not at goal: Lab Results  Component Value Date   HGBA1C 6.3 (H) 08/14/2018      Patient has been on Thyroid Replacement.     Further, the patient also has history of Vitamin D Deficiency and supplements vitamin D without any suspected side-effects. Last vitamin D was low:  Lab Results  Component Value Date   VD25OH 40 08/14/2018   Current Outpatient Medications on File Prior to Visit  Medication Sig  . Acetaminophen (TYLENOL ARTHRITIS PAIN PO) Take by mouth.  Marland Kitchen aspirin 81 MG tablet Take 81 mg by mouth daily.  Marland Kitchen atorvastatin (LIPITOR) 40 MG tablet Take 1 tablet by mouth at bedtime for cholesterol  . b complex vitamins tablet Take 1 tablet by mouth daily.  Marland Kitchen DILT-XR 120 MG 24 hr capsule Take 1 capsule by mouth daily  . dorzolamide-timolol (COSOPT) 22.3-6.8 MG/ML ophthalmic solution USE 1 DROP INTO THE AFFECTED EYE 2 TIMES DAILY  . furosemide (LASIX) 20 MG tablet Take 1 tablet by mouth twice a day  . gabapentin (NEURONTIN) 800 MG tablet Take 1 tablet (800 mg total) by mouth 3 (three)  times daily.  Marland Kitchen levothyroxine (SYNTHROID, LEVOTHROID) 75 MCG tablet Take 1 tablet by mouth 3 days a week and Take 1 & 1/2 tablets by mouth 4 days a week or as directed  . meloxicam (MOBIC) 7.5 MG tablet TAKE 1 TABLET DAILY WITH   FOOD FOR PAIN CAN TAKE A SECOND PILL LATER IN THE DAY IF YOU NEED IT  . Multiple Vitamin (MULTIVITAMIN) tablet Take 1 tablet by mouth daily.  . pantoprazole (PROTONIX) 20 MG tablet Take 1 tablet by mouth daily  . dicyclomine (BENTYL) 20 MG tablet Take 1 tablet (20 mg total) by mouth 3 (three) times daily as needed for spasms.   No current facility-administered medications on file prior to visit.    Allergies  Allergen Reactions  . Ciprofloxacin   . Macrobid [Nitrofurantoin Macrocrystal]   . Penicillins     REACTION: rash  . Procaine Hcl     REACTION: difficulty breathing  . Sulfonamide Derivatives     REACTION: hives  . Tetanus Toxoid     REACTION: arm swells   PMHx:   Past Medical History:  Diagnosis Date  . Anemia   . Chest tightness   .  Fatigue   . GERD (gastroesophageal reflux disease)   . Hyperlipidemia   . Hypothyroid   . Internal hemorrhoids 05/04/2009   Qualifier: Diagnosis of  By: Nelson-Smith CMA (AAMA), Dottie    . Myalgia   . Obesity   . Prediabetes   . Ulcer   . Varicose veins    Immunization History  Administered Date(s) Administered  . Influenza, High Dose Seasonal PF 08/19/2014, 09/10/2015, 07/26/2016, 08/10/2017, 08/14/2018  . Pneumococcal Conjugate-13 05/12/2014  . Pneumococcal Polysaccharide-23 09/05/2007, 06/02/2015   Past Surgical History:  Procedure Laterality Date  . ABDOMINAL HYSTERECTOMY    . CATARACT EXTRACTION    . CYST REMOVAL HAND Left 07/30/2018  . NASAL SINUS SURGERY    . ROTATOR CUFF REPAIR Left more than 7 years ago   Dr. Percell Miller and Dr. Earleen Newport  . TONSILLECTOMY AND ADENOIDECTOMY     FHx:    Reviewed / unchanged  SHx:    Reviewed / unchanged   Systems Review:  Constitutional: Denies fever, chills, wt  changes, headaches, insomnia, fatigue, night sweats, change in appetite. Eyes: Denies redness, blurred vision, diplopia, discharge, itchy, watery eyes.  ENT: Denies discharge, congestion, post nasal drip, epistaxis, sore throat, earache, hearing loss, dental pain, tinnitus, vertigo, sinus pain, snoring.  CV: Denies chest pain, palpitations, irregular heartbeat, syncope, dyspnea, diaphoresis, orthopnea, PND, claudication or edema. Respiratory: denies cough, dyspnea, DOE, pleurisy, hoarseness, laryngitis, wheezing.  Gastrointestinal: Denies dysphagia, odynophagia, heartburn, reflux, water brash, abdominal pain or cramps, nausea, vomiting, bloating, diarrhea, constipation, hematemesis, melena, hematochezia  or hemorrhoids. Genitourinary: Denies dysuria, frequency, urgency, nocturia, hesitancy, discharge, hematuria or flank pain. Musculoskeletal: Denies arthralgias, myalgias, stiffness, jt. swelling, pain, limping or strain/sprain.  Skin: Denies pruritus, rash, hives, warts, acne, eczema or change in skin lesion(s). Neuro: No weakness, tremor, incoordination, spasms, paresthesia or pain. Psychiatric: Denies confusion, memory loss or sensory loss. Endo: Denies change in weight, skin or hair change.  Heme/Lymph: No excessive bleeding, bruising or enlarged lymph nodes.  Physical Exam  BP 140/72   Pulse 83   Temp 98.9 F (37.2 C)   Ht 5\' 7"  (1.702 m)   Wt 239 lb 9.6 oz (108.7 kg)   SpO2 99%   BMI 37.53 kg/m   Appears  well nourished, well groomed  and in no distress.  Eyes: PERRLA, EOMs, conjunctiva no swelling or erythema. Sinuses: No frontal/maxillary tenderness ENT/Mouth: EAC's clear, TM's nl w/o erythema, bulging. Nares clear w/o erythema, swelling, exudates. Oropharynx clear without erythema or exudates. Oral hygiene is good. Tongue normal, non obstructing. Hearing intact.  Neck: Supple. Thyroid not palpable. Car 2+/2+ without bruits, nodes or JVD. Chest: Respirations nl with BS clear &  equal w/o rales, rhonchi, wheezing or stridor.  Cor: Heart sounds normal w/ regular rate and rhythm without sig. murmurs, gallops, clicks or rubs. Peripheral pulses normal and equal  without edema.  Abdomen: Soft & bowel sounds normal. Non-tender w/o guarding, rebound, hernias, masses or organomegaly.  Lymphatics: Unremarkable.  Musculoskeletal: Full ROM all peripheral extremities, joint stability, 5/5 strength and normal gait.  Skin: Warm, dry without exposed rashes, lesions or ecchymosis apparent.  Neuro: Cranial nerves intact, reflexes equal bilaterally. Sensory-motor testing grossly intact. Tendon reflexes grossly intact.  Pysch: Alert & oriented x 3.  Insight and judgement nl & appropriate. No ideations.  Assessment and Plan:  1. Essential hypertension  - Continue medication, monitor blood pressure at home.  - Continue DASH diet.  Reminder to go to the ER if any CP,  SOB, nausea, dizziness, severe  HA, changes vision/speech.  - CBC with Differential/Platelet - COMPLETE METABOLIC PANEL WITH GFR - Magnesium - TSH  2. Hyperlipidemia, mixed  - Continue diet/meds, exercise,& lifestyle modifications.  - Continue monitor periodic cholesterol/liver & renal functions   - Lipid panel - TSH  3. Controlled type 2 diabetes mellitus with complication, without long-term current use of insulin (HCC)  - Continue diet, exercise  - Lifestyle modifications.  - Monitor appropriate labs.  - Hemoglobin A1c - Insulin, random  4. Vitamin D deficiency  - Continue supplementation.  - VITAMIN D 25 Hydroxyl  5. Hypothyroidism  - TSH  6. Gastroesophageal reflux disease  - CBC with Differential/Platelet  7. Medication management  - CBC with Differential/Platelet - COMPLETE METABOLIC PANEL WITH GFR - Magnesium - Lipid panel - TSH - Hemoglobin A1c - Insulin, random - VITAMIN D 25 Hydroxyl       Discussed  regular exercise, BP monitoring, weight control to achieve/maintain BMI less  than 25 and discussed med and SE's. Recommended labs to assess and monitor clinical status with further disposition pending results of labs. Over 30 minutes of exam, counseling, chart review was performed.

## 2018-12-13 NOTE — Patient Instructions (Signed)

## 2018-12-14 LAB — COMPLETE METABOLIC PANEL WITH GFR
AG Ratio: 1.5 (calc) (ref 1.0–2.5)
ALT: 22 U/L (ref 6–29)
AST: 23 U/L (ref 10–35)
Albumin: 4.1 g/dL (ref 3.6–5.1)
Alkaline phosphatase (APISO): 99 U/L (ref 33–130)
BUN/Creatinine Ratio: 16 (calc) (ref 6–22)
BUN: 19 mg/dL (ref 7–25)
CALCIUM: 9.3 mg/dL (ref 8.6–10.4)
CO2: 25 mmol/L (ref 20–32)
Chloride: 107 mmol/L (ref 98–110)
Creat: 1.18 mg/dL — ABNORMAL HIGH (ref 0.60–0.93)
GFR, Est African American: 54 mL/min/{1.73_m2} — ABNORMAL LOW (ref >=60)
GFR, Est Non African American: 47 mL/min/{1.73_m2} — ABNORMAL LOW (ref >=60)
Globulin: 2.7 g/dL (calc) (ref 1.9–3.7)
Glucose, Bld: 103 mg/dL — ABNORMAL HIGH (ref 65–99)
POTASSIUM: 4.4 mmol/L (ref 3.5–5.3)
Sodium: 142 mmol/L (ref 135–146)
Total Bilirubin: 0.7 mg/dL (ref 0.2–1.2)
Total Protein: 6.8 g/dL (ref 6.1–8.1)

## 2018-12-14 LAB — LIPID PANEL
Cholesterol: 160 mg/dL (ref <200)
HDL: 40 mg/dL — ABNORMAL LOW (ref >50)
LDL Cholesterol (Calc): 90 mg/dL (calc)
Non-HDL Cholesterol (Calc): 120 mg/dL (calc) (ref <130)
Total CHOL/HDL Ratio: 4 (calc) (ref <5.0)
Triglycerides: 206 mg/dL — ABNORMAL HIGH (ref <150)

## 2018-12-14 LAB — HEMOGLOBIN A1C
Hgb A1c MFr Bld: 5.9 % of total Hgb — ABNORMAL HIGH (ref <5.7)
Mean Plasma Glucose: 123 (calc)
eAG (mmol/L): 6.8 (calc)

## 2018-12-14 LAB — CBC WITH DIFFERENTIAL/PLATELET
Absolute Monocytes: 902 cells/uL (ref 200–950)
Basophils Absolute: 55 cells/uL (ref 0–200)
Basophils Relative: 0.5 %
Eosinophils Absolute: 187 cells/uL (ref 15–500)
Eosinophils Relative: 1.7 %
HEMATOCRIT: 40.7 % (ref 35.0–45.0)
Hemoglobin: 13.8 g/dL (ref 11.7–15.5)
LYMPHS ABS: 2992 {cells}/uL (ref 850–3900)
MCH: 29.1 pg (ref 27.0–33.0)
MCHC: 33.9 g/dL (ref 32.0–36.0)
MCV: 85.9 fL (ref 80.0–100.0)
MPV: 11.1 fL (ref 7.5–12.5)
Monocytes Relative: 8.2 %
NEUTROS ABS: 6864 {cells}/uL (ref 1500–7800)
Neutrophils Relative %: 62.4 %
Platelets: 306 10*3/uL (ref 140–400)
RBC: 4.74 10*6/uL (ref 3.80–5.10)
RDW: 13.9 % (ref 11.0–15.0)
Total Lymphocyte: 27.2 %
WBC: 11 10*3/uL — ABNORMAL HIGH (ref 3.8–10.8)

## 2018-12-14 LAB — MAGNESIUM: Magnesium: 2.2 mg/dL (ref 1.5–2.5)

## 2018-12-14 LAB — TSH: TSH: 0.56 mIU/L (ref 0.40–4.50)

## 2018-12-14 LAB — VITAMIN D 25 HYDROXY (VIT D DEFICIENCY, FRACTURES): Vit D, 25-Hydroxy: 37 ng/mL (ref 30–100)

## 2018-12-14 LAB — INSULIN, RANDOM: Insulin: 19.9 u[IU]/mL — ABNORMAL HIGH (ref 2.0–19.6)

## 2018-12-16 ENCOUNTER — Encounter: Payer: Self-pay | Admitting: Internal Medicine

## 2018-12-20 ENCOUNTER — Ambulatory Visit (INDEPENDENT_AMBULATORY_CARE_PROVIDER_SITE_OTHER): Payer: PPO | Admitting: Orthopaedic Surgery

## 2018-12-20 ENCOUNTER — Encounter (INDEPENDENT_AMBULATORY_CARE_PROVIDER_SITE_OTHER): Payer: Self-pay | Admitting: Orthopaedic Surgery

## 2018-12-20 VITALS — BP 115/66 | HR 62 | Ht 67.0 in | Wt 239.0 lb

## 2018-12-20 DIAGNOSIS — M170 Bilateral primary osteoarthritis of knee: Secondary | ICD-10-CM

## 2018-12-20 MED ORDER — HYALURONAN 30 MG/2ML IX SOSY
30.0000 mg | PREFILLED_SYRINGE | INTRA_ARTICULAR | Status: AC | PRN
  Administered 2018-12-20: 30 mg via INTRA_ARTICULAR

## 2018-12-20 NOTE — Progress Notes (Signed)
Office Visit Note   Patient: [name]           Date of Birth: 12/16/1945           MRN: [id number] Visit Date: 12/20/2018              Requested by: Unk Pinto, [address] [place], [place] [address] PCP: Unk Pinto, MD   Assessment & Plan: Visit Diagnoses:  1. Bilateral primary osteoarthritis of knee     Plan: Second Orthovisc injection both knees.  No problem with the first but really not much relief  Follow-Up Instructions: Return in about 1 week (around 12/27/2018).   Orders:  No orders of the defined types were placed in this encounter.  No orders of the defined types were placed in this encounter.     Procedures: Large Joint Inj: L knee on 12/20/2018 2:21 PM Indications: pain and joint swelling Details: 25 G 1.5 in needle, anteromedial approach  Arthrogram: No  Medications: 30 mg Hyaluronan 30 MG/2ML Outcome: tolerated well, no immediate complications Procedure, treatment alternatives, risks and benefits explained, specific risks discussed. Consent was given by the patient. Immediately prior to procedure a time out was called to verify the correct patient, procedure, equipment, support staff and site/side marked as required. Patient was prepped and draped in the usual sterile fashion.   Large Joint Inj: R knee on 12/20/2018 2:22 PM Indications: pain and joint swelling Details: 25 G 1.5 in needle  Arthrogram: No  Medications: 30 mg Hyaluronan 30 MG/2ML Outcome: tolerated well, no immediate complications Procedure, treatment alternatives, risks and benefits explained, specific risks discussed. Consent was given by the patient. Immediately prior to procedure a time out was called to verify the correct patient, procedure, equipment, support staff and site/side marked as required. Patient was prepped and draped in the usual sterile fashion.       Clinical Data: No additional findings.   Subjective: Chief Complaint    Patient presents with  . Left Knee - Pain  . Right Ankle - Pain  No significant change in symptoms of either knee after first Orthovisc injection.  No related complications  HPI  Review of Systems   Objective: Vital Signs: BP 115/66 (BP Location: Left Arm)   Pulse 62   Ht 5\' 7"  (1.702 m)   Wt 239 lb (108.4 kg)   BMI 37.43 kg/m   Physical Exam  Ortho Exam large knees.  No obvious effusion.  Little bit of a limp when first gets up walking.  Neither knee was hot warm or red  Specialty Comments:  No specialty comments available.  Imaging: No results found.   PMFS History: Patient Active Problem List   Diagnosis Date Noted  . Vitamin D deficiency 12/03/2018  . Medication management 12/03/2018  . Atherosclerosis of aorta ([place]) 08/06/2018  . Bilateral primary osteoarthritis of knee 06/12/2018  . Toe gangrene ([place]) 04/02/2018  . History of basal cell carcinoma (BCC) 08/10/2017  . Labile hypertension 02/27/2017  . Chronic venous insufficiency 06/02/2015  . CKD (chronic kidney disease) stage 2, GFR 60-89 ml/min 06/02/2015  . Peripheral autonomic neuropathy of unknown cause 06/02/2015  . Class 2 severe obesity due to excess calories with serious comorbidity and body mass index (BMI) of 37.0 to 37.9 in adult ([place]) 08/19/2014  . Hypothyroid   . Anemia   . Diabetes mellitus type 2, controlled, with complications ([place])   . History of tobacco use 04/09/2012  . Hyperlipidemia, mixed   .  GERD 05/04/2009  . Diverticulosis of large intestine 05/04/2009   Past Medical History:  Diagnosis Date  . Anemia   . Chest tightness   . Fatigue   . GERD (gastroesophageal reflux disease)   . Hyperlipidemia   . Hypothyroid   . Internal hemorrhoids 05/04/2009   Qualifier: Diagnosis of  By: Nelson-Smith CMA (AAMA), Dottie    . Myalgia   . Obesity   . Prediabetes   . Ulcer   . Varicose veins     Family History  Problem Relation Age of Onset  . Coronary artery disease Father 28     Past Surgical History:  Procedure Laterality Date  . ABDOMINAL HYSTERECTOMY    . CATARACT EXTRACTION    . CYST REMOVAL HAND Left 07/30/2018  . NASAL SINUS SURGERY    . ROTATOR CUFF REPAIR Left more than 7 years ago   Dr. Percell Miller and Dr. Earleen Newport  . TONSILLECTOMY AND ADENOIDECTOMY     Social History   Occupational History  . 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 Smoking status: Former Smoker    Packs/day: 0.50    Years: 46.00    Pack years: 23.00    Types: Cigarettes    Last attempt to quit: 05/27/2013    Years since quitting: 5.5  . Smokeless tobacco: Never Used  Substance and Sexual Activity  . Alcohol use: Not Currently  . Drug use: Not Currently  . Sexual activity: Not on file     Garald Balding, MD   Note - This record has been created using [employer].  Chart creation errors have been sought, but may not always  have been located. Such creation errors do not reflect on  the standard of medical care.

## 2018-12-27 ENCOUNTER — Ambulatory Visit (INDEPENDENT_AMBULATORY_CARE_PROVIDER_SITE_OTHER): Payer: PPO | Admitting: Orthopaedic Surgery

## 2018-12-31 ENCOUNTER — Encounter (INDEPENDENT_AMBULATORY_CARE_PROVIDER_SITE_OTHER): Payer: Self-pay | Admitting: Orthopaedic Surgery

## 2018-12-31 ENCOUNTER — Ambulatory Visit (INDEPENDENT_AMBULATORY_CARE_PROVIDER_SITE_OTHER): Payer: PPO | Admitting: Orthopaedic Surgery

## 2018-12-31 VITALS — BP 110/64 | HR 66 | Ht 67.0 in | Wt 239.0 lb

## 2018-12-31 DIAGNOSIS — M170 Bilateral primary osteoarthritis of knee: Secondary | ICD-10-CM | POA: Diagnosis not present

## 2018-12-31 MED ORDER — HYALURONAN 30 MG/2ML IX SOSY
30.0000 mg | PREFILLED_SYRINGE | INTRA_ARTICULAR | Status: AC | PRN
  Administered 2018-12-31: 30 mg via INTRA_ARTICULAR

## 2018-12-31 NOTE — Progress Notes (Signed)
Office Visit Note   Patient: [name]           Date of Birth: Mar 20, 1946           MRN: [id number] Visit Date: 12/31/2018              Requested by: Unk Pinto, [address] [place], [place] [address] PCP: Unk Pinto, MD   Assessment & Plan: Visit Diagnoses:  1. Bilateral primary osteoarthritis of knee     Plan: Third and final Orthovisc injections both knees.  [name] not sure she has had much relief long discussion regarding need for exercises and weight loss  Follow-Up Instructions: Return if symptoms worsen or fail to improve.   Orders:  Orders Placed This Encounter  Procedures  . Large Joint Inj: bilateral knee   No orders of the defined types were placed in this encounter.     Procedures: Large Joint Inj: bilateral knee on 12/31/2018 12:28 PM Indications: pain and joint swelling Details: 25 G 1.5 in needle, anteromedial approach  Arthrogram: No  Medications (Right): 30 mg Hyaluronan 30 MG/2ML Medications (Left): 30 mg Hyaluronan 30 MG/2ML Outcome: tolerated well, no immediate complications Procedure, treatment alternatives, risks and benefits explained, specific risks discussed. Consent was given by the patient. Immediately prior to procedure a time out was called to verify the correct patient, procedure, equipment, support staff and site/side marked as required. Patient was prepped and draped in the usual sterile fashion.       Clinical Data: No additional findings.   Subjective: Chief Complaint  Patient presents with  . Left Knee - Follow-up  . Right Knee - Follow-up  Patient presents today for #3 orthovisc bilaterally. She started the injections on 12/11/2018 .Patient states that she has noticed no improvement yet. She is taking tylenol and using Asper Creme as needed.  HPI  Review of Systems  Constitutional: Negative for fatigue.  HENT: Negative for ear pain.   Eyes: Negative for pain.  Respiratory:  Negative for shortness of breath.   Cardiovascular: Positive for leg swelling.  Gastrointestinal: Negative for constipation and diarrhea.  Endocrine: Negative for cold intolerance and heat intolerance.  Genitourinary: Negative for difficulty urinating.  Musculoskeletal: Positive for joint swelling.  Skin: Negative for rash.  Allergic/Immunologic: Negative for food allergies.  Neurological: Negative for weakness.  Hematological: Does not bruise/bleed easily.  Psychiatric/Behavioral: Negative for sleep disturbance.     Objective: Vital Signs: BP 110/64   Pulse 66   Ht 5\' 7"  (1.702 m)   Wt 239 lb (108.4 kg)   BMI 37.43 kg/m   Physical Exam  Ortho Exam large knees.  Not sure there was an effusion.  Full extension and flexion about 100 degrees without instability.  Predominately medial joint pain bilaterally  Specialty Comments:  No specialty comments available.  Imaging: No results found.   PMFS History: Patient Active Problem List   Diagnosis Date Noted  . Vitamin D deficiency 12/03/2018  . Medication management 12/03/2018  . Atherosclerosis of aorta ([place]) 08/06/2018  . Bilateral primary osteoarthritis of knee 06/12/2018  . Toe gangrene ([place]) 04/02/2018  . History of basal cell carcinoma (BCC) 08/10/2017  . Labile hypertension 02/27/2017  . Chronic venous insufficiency 06/02/2015  . CKD (chronic kidney disease) stage 2, GFR 60-89 ml/min 06/02/2015  . Peripheral autonomic neuropathy of unknown cause 06/02/2015  . Class 2 severe obesity due to excess calories with serious comorbidity and body mass index (BMI) of 37.0 to 37.9 in  adult ([place]) 08/19/2014  . Hypothyroid   . Anemia   . Diabetes mellitus type 2, controlled, with complications ([place])   . History of tobacco use 04/09/2012  . Hyperlipidemia, mixed   . GERD 05/04/2009  . Diverticulosis of large intestine 05/04/2009   Past Medical History:  Diagnosis Date  . Anemia   . Chest tightness   . Fatigue   . GERD  (gastroesophageal reflux disease)   . Hyperlipidemia   . Hypothyroid   . Internal hemorrhoids 05/04/2009   Qualifier: Diagnosis of  By: Nelson-Smith CMA (AAMA), Dottie    . Myalgia   . Obesity   . Prediabetes   . Ulcer   . Varicose veins     Family History  Problem Relation Age of Onset  . Coronary artery disease Father 32    Past Surgical History:  Procedure Laterality Date  . ABDOMINAL HYSTERECTOMY    . CATARACT EXTRACTION    . CYST REMOVAL HAND Left 07/30/2018  . NASAL SINUS SURGERY    . ROTATOR CUFF REPAIR Left more than 7 years ago   Dr. Percell Miller and Dr. Earleen Newport  . TONSILLECTOMY AND ADENOIDECTOMY     Social History   Occupational History  . Occupation: [employer]    Employer: [employer]  Tobacco Use  . Smoking status: Former Smoker    Packs/day: 0.50    Years: 46.00    Pack years: 23.00    Types: Cigarettes    Last attempt to quit: 05/27/2013    Years since quitting: 5.6  . Smokeless tobacco: Never Used  Substance and Sexual Activity  . Alcohol use: Not Currently  . Drug use: Not Currently  . Sexual activity: Not on file

## 2019-01-02 ENCOUNTER — Other Ambulatory Visit: Payer: Self-pay

## 2019-01-02 MED ORDER — MELOXICAM 7.5 MG PO TABS: ORAL_TABLET | ORAL | 1 refills | Status: DC

## 2019-01-22 DIAGNOSIS — H40013 Open angle with borderline findings, low risk, bilateral: Secondary | ICD-10-CM | POA: Diagnosis not present

## 2019-01-23 ENCOUNTER — Ambulatory Visit: Payer: PPO | Admitting: Podiatry

## 2019-01-23 DIAGNOSIS — G629 Polyneuropathy, unspecified: Secondary | ICD-10-CM | POA: Diagnosis not present

## 2019-01-23 DIAGNOSIS — M79675 Pain in left toe(s): Secondary | ICD-10-CM | POA: Diagnosis not present

## 2019-01-23 DIAGNOSIS — B351 Tinea unguium: Secondary | ICD-10-CM | POA: Diagnosis not present

## 2019-01-23 DIAGNOSIS — L84 Corns and callosities: Secondary | ICD-10-CM

## 2019-01-23 DIAGNOSIS — M79674 Pain in right toe(s): Secondary | ICD-10-CM | POA: Diagnosis not present

## 2019-01-23 NOTE — Patient Instructions (Signed)
Diabetes Mellitus and Foot Care Foot care is an important part of your health, especially when you have diabetes. Diabetes may cause you to have problems because of poor blood flow (circulation) to your feet and legs, which can cause your skin to:  Become thinner and drier.  Break more easily.  Heal more slowly.  Peel and crack. You may also have nerve damage (neuropathy) in your legs and feet, causing decreased feeling in them. This means that you may not notice minor injuries to your feet that could lead to more serious problems. Noticing and addressing any potential problems early is the best way to prevent future foot problems. How to care for your feet Foot hygiene  Wash your feet daily with warm water and mild soap. Do not use hot water. Then, pat your feet and the areas between your toes until they are completely dry. Do not soak your feet as this can dry your skin.  Trim your toenails straight across. Do not dig under them or around the cuticle. File the edges of your nails with an emery board or nail file.  Apply a moisturizing lotion or petroleum jelly to the skin on your feet and to dry, brittle toenails. Use lotion that does not contain alcohol and is unscented. Do not apply lotion between your toes. Shoes and socks  Wear clean socks or stockings every day. Make sure they are not too tight. Do not wear knee-high stockings since they may decrease blood flow to your legs.  Wear shoes that fit properly and have enough cushioning. Always look in your shoes before you put them on to be sure there are no objects inside.  To break in new shoes, wear them for just a few hours a day. This prevents injuries on your feet. Wounds, scrapes, corns, and calluses  Check your feet daily for blisters, cuts, bruises, sores, and redness. If you cannot see the bottom of your feet, use a mirror or ask someone for help.  Do not cut corns or calluses or try to remove them with medicine.  If you  find a minor scrape, cut, or break in the skin on your feet, keep it and the skin around it clean and dry. You may clean these areas with mild soap and water. Do not clean the area with peroxide, alcohol, or iodine.  If you have a wound, scrape, corn, or callus on your foot, look at it several times a day to make sure it is healing and not infected. Check for: ? Redness, swelling, or pain. ? Fluid or blood. ? Warmth. ? Pus or a bad smell. General instructions  Do not cross your legs. This may decrease blood flow to your feet.  Do not use heating pads or hot water bottles on your feet. They may burn your skin. If you have lost feeling in your feet or legs, you may not know this is happening until it is too late.  Protect your feet from hot and cold by wearing shoes, such as at the beach or on hot pavement.  Schedule a complete foot exam at least once a year (annually) or more often if you have foot problems. If you have foot problems, report any cuts, sores, or bruises to your health care provider immediately. Contact a health care provider if:  You have a medical condition that increases your risk of infection and you have any cuts, sores, or bruises on your feet.  You have an injury that is not   healing.  You have redness on your legs or feet.  You feel burning or tingling in your legs or feet.  You have pain or cramps in your legs and feet.  Your legs or feet are numb.  Your feet always feel cold.  You have pain around a toenail. Get help right away if:  You have a wound, scrape, corn, or callus on your foot and: ? You have pain, swelling, or redness that gets worse. ? You have fluid or blood coming from the wound, scrape, corn, or callus. ? Your wound, scrape, corn, or callus feels warm to the touch. ? You have pus or a bad smell coming from the wound, scrape, corn, or callus. ? You have a fever. ? You have a red line going up your leg. Summary  Check your feet every day  for cuts, sores, red spots, swelling, and blisters.  Moisturize feet and legs daily.  Wear shoes that fit properly and have enough cushioning.  If you have foot problems, report any cuts, sores, or bruises to your health care provider immediately.  Schedule a complete foot exam at least once a year (annually) or more often if you have foot problems. This information is not intended to replace advice given to you by your health care provider. Make sure you discuss any questions you have with your health care provider. Document Released: 11/04/2000 Document Revised: 12/20/2017 Document Reviewed: 12/09/2016 Elsevier Interactive Patient Education  2019 [employer]  Onychomycosis/Fungal Toenails  WHAT IS IT? An infection that lies within the keratin of your nail plate that is caused by a fungus.  WHY ME? Fungal infections affect all ages, sexes, races, and creeds.  There may be many factors that predispose you to a fungal infection such as age, coexisting medical conditions such as diabetes, or an autoimmune disease; stress, medications, fatigue, genetics, etc.  Bottom line: fungus thrives in a warm, moist environment and your shoes offer such a location.  IS IT CONTAGIOUS? Theoretically, yes.  You do not want to share shoes, nail clippers or files with someone who has fungal toenails.  Walking around barefoot in the same room or sleeping in the same bed is unlikely to transfer the organism.  It is important to realize, however, that fungus can spread easily from one nail to the next on the same foot.  HOW DO WE TREAT THIS?  There are several ways to treat this condition.  Treatment may depend on many factors such as age, medications, pregnancy, liver and kidney conditions, etc.  It is best to ask your doctor which options are available to you.  1. No treatment.   Unlike many other medical concerns, you can live with this condition.  However for many people this can be a painful condition and  may lead to ingrown toenails or a bacterial infection.  It is recommended that you keep the nails cut short to help reduce the amount of fungal nail. 2. Topical treatment.  These range from herbal remedies to prescription strength nail lacquers.  About 40-50% effective, topicals require twice daily application for approximately 9 to 12 months or until an entirely new nail has grown out.  The most effective topicals are medical grade medications available through physicians offices. 3. Oral antifungal medications.  With an 80-90% cure rate, the most common oral medication requires 3 to 4 months of therapy and stays in your system for a year as the new nail grows out.  Oral antifungal medications do require   blood work to make sure it is a safe drug for you.  A liver function panel will be performed prior to starting the medication and after the first month of treatment.  It is important to have the blood work performed to avoid any harmful side effects.  In general, this medication safe but blood work is required. 4. Laser Therapy.  This treatment is performed by applying a specialized laser to the affected nail plate.  This therapy is noninvasive, fast, and non-painful.  It is not covered by insurance and is therefore, out of pocket.  The results have been very good with a 80-95% cure rate.  [facility] is the only practice in the area to offer this therapy. 5. Permanent Nail Avulsion.  Removing the entire nail so that a new nail will not grow back. 

## 2019-01-31 ENCOUNTER — Encounter: Payer: Self-pay | Admitting: Podiatry

## 2019-01-31 NOTE — Progress Notes (Signed)
Subjective: [name] presents with history of neuropathy and cc of painful, discolored, thick toenails and painful corn left second digit which interfere with activities of daily living. Pain is aggravated when wearing enclosed shoe gear. Pain is relieved with periodic professional debridement.  Patient would like to talk about laser therapy for mycotic toenails.  Unk Pinto, MD is her PCP.  Last visit December 13, 2018.   Current Outpatient Medications:  .  Acetaminophen (TYLENOL ARTHRITIS PAIN PO), Take by mouth., Disp: , Rfl:  .  aspirin 81 MG tablet, Take 81 mg by mouth daily., Disp: , Rfl:  .  atorvastatin (LIPITOR) 40 MG tablet, Take 1 tablet by mouth at bedtime for cholesterol, Disp: 90 tablet, Rfl: 0 .  b complex vitamins tablet, Take 1 tablet by mouth daily., Disp: , Rfl:  .  dicyclomine (BENTYL) 20 MG tablet, Take 1 tablet (20 mg total) by mouth 3 (three) times daily as needed for spasms., Disp: 30 tablet, Rfl: 0 .  DILT-XR 120 MG 24 hr capsule, Take 1 capsule by mouth daily, Disp: 90 capsule, Rfl: 1 .  dorzolamide-timolol (COSOPT) 22.3-6.8 MG/ML ophthalmic solution, USE 1 DROP INTO THE AFFECTED EYE 2 TIMES DAILY, Disp: , Rfl: 1 .  furosemide (LASIX) 20 MG tablet, Take 1 tablet by mouth twice a day, Disp: 180 tablet, Rfl: 0 .  gabapentin (NEURONTIN) 800 MG tablet, Take 1 tablet (800 mg total) by mouth 3 (three) times daily., Disp: 90 tablet, Rfl: 3 .  levothyroxine (SYNTHROID, LEVOTHROID) 75 MCG tablet, Take 1 tablet by mouth 3 days a week and Take 1 & 1/2 tablets by mouth 4 days a week or as directed, Disp: 114 tablet, Rfl: 0 .  meloxicam (MOBIC) 7.5 MG tablet, TAKE 1 TABLET DAILY WITH   FOOD FOR PAIN CAN TAKE A SECOND PILL LATER IN THE DAY IF YOU NEED IT, Disp: 180 tablet, Rfl: 1 .  Multiple Vitamin (MULTIVITAMIN) tablet, Take 1 tablet by mouth daily., Disp: , Rfl:  .  pantoprazole (PROTONIX) 20 MG tablet, Take 1 tablet by mouth daily, Disp: 90 tablet, Rfl: 1  Allergies   Allergen Reactions  . Ciprofloxacin   . Macrobid [Nitrofurantoin Macrocrystal]   . Penicillins     REACTION: rash  . Procaine Hcl     REACTION: difficulty breathing  . Sulfonamide Derivatives     REACTION: hives  . Tetanus Toxoid     REACTION: arm swells    Vascular Examination: Capillary refill time <3 seconds x 10 digits.  Dorsalis pedis and Posterior tibial pulses present b/l.  No digital hair x 10 digits.  Skin temperature gradient within normal limits bilaterally.  Dermatological Examination: Skin with normal turgor, texture and tone b/l.  Toenails 1-5 b/l discolored, thick, dystrophic with subungual debris and pain with palpation to nailbeds due to thickness of nails.  Hyperkeratotic lesion distal tip left second digit.  There is no erythema, no edema, no drainage, no flocculence noted.  Musculoskeletal: Muscle strength 5/5 to all LE muscle groups  Neurological: Sensation with 10 g monofilament is absent bilaterally.  Vibratory sensation absent bilaterally.  Assessment: 1. Painful onychomycosis toenails 1-5 b/l 2. Corn distal tip left second digit 3. Peripheral neuropathy  Plan: 1. Continue diabetic foot care principles.  Literature dispensed on today. 2. Toenails 1-5 b/l were debrided in length and girth without iatrogenic bleeding. We did discuss the procedure for laser therapy for onychomycosis.  She will think about it. 3. Corn pared utilizing sterile scalpel blade distal  tip left second digit without incident. 4. Patient to continue soft, supportive shoe gear. 5. Patient to report any pedal injuries to medical professional  6. Follow up 3 months.  7. Patient/POA to call should there be a concern in the interim.

## 2019-02-07 ENCOUNTER — Other Ambulatory Visit: Payer: Self-pay | Admitting: Adult Health

## 2019-02-18 ENCOUNTER — Other Ambulatory Visit: Payer: Self-pay | Admitting: *Deleted

## 2019-02-18 DIAGNOSIS — I10 Essential (primary) hypertension: Secondary | ICD-10-CM

## 2019-02-18 DIAGNOSIS — E785 Hyperlipidemia, unspecified: Secondary | ICD-10-CM

## 2019-02-18 MED ORDER — FUROSEMIDE 20 MG PO TABS: 20.0000 mg | ORAL_TABLET | Freq: Two times a day (BID) | ORAL | 1 refills | Status: DC

## 2019-02-18 MED ORDER — ATORVASTATIN CALCIUM 40 MG PO TABS: ORAL_TABLET | ORAL | 1 refills | Status: DC

## 2019-02-19 ENCOUNTER — Other Ambulatory Visit: Payer: Self-pay

## 2019-02-19 DIAGNOSIS — I10 Essential (primary) hypertension: Secondary | ICD-10-CM

## 2019-02-19 MED ORDER — DILTIAZEM HCL ER 120 MG PO CP24: 120.0000 mg | ORAL_CAPSULE | Freq: Every day | ORAL | 1 refills | Status: DC

## 2019-02-27 ENCOUNTER — Other Ambulatory Visit: Payer: PPO

## 2019-03-13 ENCOUNTER — Other Ambulatory Visit: Payer: Self-pay

## 2019-03-13 MED ORDER — MELOXICAM 7.5 MG PO TABS: ORAL_TABLET | ORAL | 1 refills | Status: DC

## 2019-04-01 DIAGNOSIS — I10 Essential (primary) hypertension: Secondary | ICD-10-CM | POA: Insufficient documentation

## 2019-04-01 DIAGNOSIS — K2100 Gastro-esophageal reflux disease with esophagitis, without bleeding: Secondary | ICD-10-CM | POA: Insufficient documentation

## 2019-04-01 NOTE — Progress Notes (Signed)
MEDICARE ANNUAL WELLNESS VISIT AND FOLLOW UP  Assessment:   [name] was seen today for follow-up and medicare wellness.  Diagnoses and all orders for this visit:  Medicare annual wellness visit, subsequent Yearly  Essential hypertension - continue medications, DASH diet, exercise and monitor at home. Call if greater than 130/80.  -     CBC with Differential/Platelet -     COMPLETE METABOLIC PANEL WITH GFR -     Magnesium  Hyperlipidemia, mixed Continue medications: Continue low cholesterol diet and exercise.  -     Lipid panel  Hypothyroidism, unspecified type Taking levothyroxine 14mcg, three days a week, and 1.5tab (112.58mcg) four days a week. Asymptomatic, continue current regiment -     TSH  Controlled type 2 diabetes mellitus with complication, without long-term current use of insulin (HCC) Diet controlled, no medications at this time Discussed dietary and exercise modifications. -     Hemoglobin A1c -     Insulin, random -     Cancel: Urinalysis w microscopic + reflex cultur  CKD (chronic kidney disease) stage 2, GFR 60-89 ml/min Increase fluids, avoid NSAIDS, monitor sugars, will monitor -     COMPLETE METABOLIC PANEL WITH GFR  Gastroesophageal reflux disease, esophagitis presence not specified Doing well at this time, continue current regiment with benefit -     pantoprazole (PROTONIX) 20 MG tablet; Take 1 tablet (20 mg total) by mouth daily.  Chronic venous insufficiency  Varicose veins Weight loss discussed,  Discussed compression stockings and elevation  Anemia, unspecified type Taking multivitamin Will check labs, continue to monitor  Vitamin D deficiency Discussed OTC supplementation -     VITAMIN D 25 Hydroxy (Vit-D Deficiency)  Atherosclerosis of aorta (HCC) Control blood pressure, cholesterol & glucose  Diverticulosis of large intestine without hemorrhage Doing well at this time, last flare, unable to recall. Would like current refills just  incase. RX: -   dicyclomine (BENTYL) 20 MG tablet; Take 1 tablet (20 mg total) by mouth 3 (three) times daily as needed for spasms. -     ondansetron (ZOFRAN) 4 MG tablet; Take 1 tablet (4 mg total) by mouth daily as needed for nausea or vomiting.  Peripheral autonomic neuropathy of unknown cause  Managing at this  Time Continue Neurontin 800mg  TID Check feet daily Proper fitted shoes  Primary osteoarthritis of knee, unspecified laterality Had injection 2/10, Dr Durward Fortes Doing well at this time  Medication management -     CBC with Differential/Platelet -     COMPLETE METABOLIC PANEL WITH GFR -     Magnesium -     Lipid panel -     TSH -     Hemoglobin A1c -     Insulin, random -     VITAMIN D 25 Hydroxy (Vit-D Deficiency, Fractures)   Over 40 minutes of exam, counseling, chart review and critical decision making was performed Future Appointments  Date Time Provider [place]  04/23/2019  3:45 PM Marzetta Board, DPM [facility]  07/10/2019  3:45 PM Vicie Mutters, PA-C [facility] None  10/10/2019  2:00 PM Vicie Mutters, PA-C [facility] None  04/21/2020  2:00 PM Garnet Sierras, NP [facility] None     Plan:   During the course of the visit the patient was educated and counseled about appropriate screening and preventive services including:    Pneumococcal vaccine   Prevnar 13  Influenza vaccine  Td vaccine  Screening electrocardiogram  Bone densitometry screening  Colorectal cancer screening  Diabetes screening  Glaucoma screening  Nutrition counseling   Advanced directives: requested   Subjective:  [name] is a 73 y.o. female who presents for Medicare Annual Wellness Visit and 3 month follow up for HTN, DMII, HLD, Hypothyroidism, GERD, weight and Vitamin D Deficiency. [facility] every 60days for nueropathy. Next appointment is in June.   Marland Kitchen Her blood pressure has been controlled at home, today their BP is BP:  118/78 She does not workout. She denies chest pain, shortness of breath, dizziness.  She is on cholesterol medication and denies myalgias. Her cholesterol is not at goal. The cholesterol last visit was:   Lab Results  Component Value Date   CHOL 160 12/13/2018   HDL 40 (L) 12/13/2018   LDLCALC 90 12/13/2018   TRIG 206 (H) 12/13/2018   CHOLHDL 4.0 12/13/2018   . She has not been working on diet and exercise for DMII, and denies foot ulcerations, hyperglycemia, hypoglycemia , increased appetite, nausea, paresthesia of the feet, polydipsia, polyuria, visual disturbances and vomiting. Last A1C in the office was:  Lab Results  Component Value Date   HGBA1C 5.9 (H) 12/13/2018   Last GFR:   Lab Results  Component Value Date   GFRNONAA 47 (L) 12/13/2018   Lab Results  Component Value Date   GFRAA 54 (L) 12/13/2018   Patient is on Vitamin D supplement.   Lab Results  Component Value Date   VD25OH 37 12/13/2018      Medication Review: Current Outpatient Medications on File Prior to Visit  Medication Sig Dispense Refill  . Acetaminophen (TYLENOL ARTHRITIS PAIN PO) Take by mouth.    Marland Kitchen aspirin 81 MG tablet Take 81 mg by mouth daily.    Marland Kitchen atorvastatin (LIPITOR) 40 MG tablet Take 1 tablet by mouth at bedtime for cholesterol 90 tablet 1  . b complex vitamins tablet Take 1 tablet by mouth daily.    Marland Kitchen diltiazem (DILT-XR) 120 MG 24 hr capsule Take 1 capsule (120 mg total) by mouth daily. 90 capsule 1  . dorzolamide-timolol (COSOPT) 22.3-6.8 MG/ML ophthalmic solution USE 1 DROP INTO THE AFFECTED EYE 2 TIMES DAILY  1  . furosemide (LASIX) 20 MG tablet Take 1 tablet (20 mg total) by mouth 2 (two) times daily. 180 tablet 1  . gabapentin (NEURONTIN) 800 MG tablet Take 1 tablet (800 mg total) by mouth 3 (three) times daily. 90 tablet 3  . levothyroxine (SYNTHROID, LEVOTHROID) 75 MCG tablet Take 1 tablet by mouth 3 days a week and Take 1 & 1/2 tablets by mouth 4 days a week or as directed 114 tablet  0  . meloxicam (MOBIC) 7.5 MG tablet TAKE 1 TABLET DAILY WITH   FOOD FOR PAIN CAN TAKE A SECOND PILL LATER IN THE DAY IF YOU NEED IT 180 tablet 1  . Multiple Vitamin (MULTIVITAMIN) tablet Take 1 tablet by mouth daily.     No current facility-administered medications on file prior to visit.     Allergies  Allergen Reactions  . Ciprofloxacin   . Macrobid [Nitrofurantoin Macrocrystal]   . Penicillins     REACTION: rash  . Procaine Hcl     REACTION: difficulty breathing  . Sulfonamide Derivatives     REACTION: hives  . Tetanus Toxoid     REACTION: arm swells    Current Problems (verified) Patient Active Problem List   Diagnosis Date Noted  . Essential hypertension 04/01/2019  . Gastroesophageal reflux disease with esophagitis 04/01/2019  . Vitamin D deficiency 12/03/2018  .  Medication management 12/03/2018  . Atherosclerosis of aorta ([place]) 08/06/2018  . Osteoarthritis of knee 06/12/2018  . Toe gangrene ([place]) 04/02/2018  . History of basal cell carcinoma (BCC) 08/10/2017  . Labile hypertension 02/27/2017  . Chronic venous insufficiency 06/02/2015  . CKD (chronic kidney disease) stage 2, GFR 60-89 ml/min 06/02/2015  . Peripheral autonomic neuropathy of unknown cause 06/02/2015  . Class 2 severe obesity due to excess calories with serious comorbidity and body mass index (BMI) of 37.0 to 37.9 in adult ([place]) 08/19/2014  . Hypothyroidism   . Anemia   . Controlled type 2 diabetes mellitus with complication, without long-term current use of insulin ([place])   . History of tobacco use 04/09/2012  . Hyperlipidemia, mixed   . GERD 05/04/2009  . Diverticulosis of large intestine 05/04/2009    Screening Tests Immunization History  Administered Date(s) Administered  . Influenza, High Dose Seasonal PF 08/19/2014, 09/10/2015, 07/26/2016, 08/10/2017, 08/14/2018  . Pneumococcal Conjugate-13 05/12/2014  . Pneumococcal Polysaccharide-23 09/05/2007, 06/02/2015    Preventative care: Last  colonoscopy: 2011, Due 2021 Last mammogram: 2019, Due for 2020 Last pap smear/pelvic exam: 02/2011 DEXA:07/2012, normal  Prior vaccinations: TD or Tdap: ALLERGY  Influenza: 2019 Pneumococcal: 2016 Prevnar13: 2015  Shingles/Zostavax: N/A   Names of Other Physician/Practitioners you currently use: 1. [facility] here for primary care 2. Eye Exam:, Q44months glaucoma, scheduled 05/2019 3. Dental Exam: Due for 2020, finding new  Patient Care Team: Unk Pinto, MD as PCP - General (Internal Medicine) Macarthur Critchley, [place] as Referring Physician (Optometry) Vicie Mutters, PA-C as Referring Physician (Physician Assistant)  SURGICAL HISTORY She  has a past surgical history that includes Abdominal hysterectomy; Tonsillectomy and adenoidectomy; Cataract extraction; Nasal sinus surgery; Rotator cuff repair (Left, more than 7 years ago); and Cyst removal hand (Left, 07/30/2018). FAMILY HISTORY Her family history includes Coronary artery disease (age of onset: 73) in her father. SOCIAL HISTORY She  reports that she quit smoking about 5 years ago. Her smoking use included cigarettes. She has a 23.00 pack-year smoking history. She has never used smokeless tobacco. She reports previous alcohol use. She reports previous drug use.   MEDICARE WELLNESS OBJECTIVES: Physical activity: Current Exercise Habits: The patient does not participate in regular exercise at present Cardiac risk factors: Cardiac Risk Factors include: advanced age (>59men, >24 women);dyslipidemia;hypertension;diabetes mellitus Depression/mood screen:   Depression screen [facility] 2/9 04/02/2019  Decreased Interest 0  Down, Depressed, Hopeless 0  PHQ - 2 Score 0    ADLs:  In your present state of health, do you have any difficulty performing the following activities: 04/02/2019 12/16/2018  Hearing? N N  Vision? N N  Difficulty concentrating or making decisions? N N  Walking or climbing stairs? N N   Dressing or bathing? N N  Doing errands, shopping? N N  Preparing Food and eating ? N -  Using the Toilet? N -  In the past six months, have you accidently leaked urine? N -  Do you have problems with loss of bowel control? N -  Managing your Medications? N -  Managing your Finances? N -  Housekeeping or managing your Housekeeping? N -  Some recent data might be hidden     Cognitive Testing  Alert? Yes  Normal Appearance?Yes  Oriented to person? Yes  Place? Yes   Time? Yes  Recall of three objects?  Yes  Can perform simple calculations? Yes  Displays appropriate judgment?Yes  Can read the correct time from a watch  face?Yes  EOL planning: Does Patient Have a Medical Advance Directive?: Yes Type of Advance Directive: Healthcare Power of Attorney, Living will Does patient want to make changes to medical advance directive?: Yes (Inpatient - patient defers changing a medical advance directive at this time - Information given) Copy of [place] in Chart?: No - copy requested  Review of Systems  Constitutional: Negative for chills, diaphoresis, fever, malaise/fatigue and weight loss.  HENT: Negative for congestion, ear discharge, ear pain, hearing loss, nosebleeds, sinus pain, sore throat and tinnitus.   Eyes: Negative for blurred vision, double vision, photophobia, pain, discharge and redness.  Respiratory: Negative for cough, hemoptysis, sputum production, shortness of breath, wheezing and stridor.   Cardiovascular: Negative for chest pain, palpitations, orthopnea, claudication, leg swelling and PND.  Gastrointestinal: Negative for abdominal pain, blood in stool, constipation, diarrhea, heartburn, melena, nausea and vomiting.  Genitourinary: Negative for dysuria, flank pain, frequency, hematuria and urgency.  Musculoskeletal: Negative for back pain, falls, joint pain, myalgias and neck pain.  Skin: Negative for itching and rash.  Neurological: Positive for sensory  change. Negative for dizziness, tremors, speech change, focal weakness, seizures, loss of consciousness, weakness and headaches.  Endo/Heme/Allergies: Negative for environmental allergies and polydipsia. Does not bruise/bleed easily.  Psychiatric/Behavioral: Negative for depression, hallucinations, memory loss, substance abuse and suicidal ideas. The patient is not nervous/anxious and does not have insomnia.      Objective:     Today's Vitals   04/02/19 1324  BP: 118/78  Pulse: 72  Temp: 98 F (36.7 C)  SpO2: 98%  Weight: 232 lb 12.8 oz (105.6 kg)  Height: 5\' 7"  (1.702 m)   Body mass index is 36.46 kg/m.  General appearance: alert, no distress, WD/WN, female HEENT: normocephalic, sclerae anicteric, TMs pearly, nares patent, no discharge or erythema, pharynx normal Oral cavity: MMM, no lesions Neck: supple, no lymphadenopathy, no thyromegaly, no masses Heart: RRR, normal S1, S2, no murmurs Lungs: CTA bilaterally, no wheezes, rhonchi, or rales Abdomen: +bs, soft, non tender, non distended, no masses, no hepatomegaly, no splenomegaly Musculoskeletal: nontender, no swelling, no obvious deformity Extremities: no edema, no cyanosis, no clubbing Pulses: 2+ symmetric, upper and lower extremities, normal cap refill Neurological: alert, oriented x 3, CN2-12 intact, strength normal upper extremities and lower extremities, sensation normal throughout, DTRs 2+ throughout, no cerebellar signs, gait normal Psychiatric: normal affect, behavior normal, pleasant   Medicare Attestation I have personally reviewed: The patient's medical and social history Their use of alcohol, tobacco or illicit drugs Their current medications and supplements The patient's functional ability including ADLs,fall risks, home safety risks, cognitive, and hearing and visual impairment Diet and physical activities Evidence for depression or mood disorders  The patient's weight, height, BMI, and visual acuity have  been recorded in the chart.  I have made referrals, counseling, and provided education to the patient based on review of the above and I have provided the patient with a written personalized care plan for preventive services.     Garnet Sierras, NP [facility] 04/02/2019  1:30 PM

## 2019-04-02 ENCOUNTER — Encounter: Payer: Self-pay | Admitting: Adult Health Nurse Practitioner

## 2019-04-02 ENCOUNTER — Ambulatory Visit: Payer: Self-pay | Admitting: Adult Health Nurse Practitioner

## 2019-04-02 ENCOUNTER — Ambulatory Visit (INDEPENDENT_AMBULATORY_CARE_PROVIDER_SITE_OTHER): Payer: PPO | Admitting: Adult Health Nurse Practitioner

## 2019-04-02 ENCOUNTER — Other Ambulatory Visit: Payer: Self-pay

## 2019-04-02 ENCOUNTER — Ambulatory Visit: Payer: Self-pay | Admitting: Physician Assistant

## 2019-04-02 VITALS — BP 118/78 | HR 72 | Temp 98.0°F | Ht 67.0 in | Wt 232.8 lb

## 2019-04-02 DIAGNOSIS — R6889 Other general symptoms and signs: Secondary | ICD-10-CM

## 2019-04-02 DIAGNOSIS — E782 Mixed hyperlipidemia: Secondary | ICD-10-CM

## 2019-04-02 DIAGNOSIS — E559 Vitamin D deficiency, unspecified: Secondary | ICD-10-CM

## 2019-04-02 DIAGNOSIS — E039 Hypothyroidism, unspecified: Secondary | ICD-10-CM

## 2019-04-02 DIAGNOSIS — D649 Anemia, unspecified: Secondary | ICD-10-CM

## 2019-04-02 DIAGNOSIS — I872 Venous insufficiency (chronic) (peripheral): Secondary | ICD-10-CM

## 2019-04-02 DIAGNOSIS — Z0001 Encounter for general adult medical examination with abnormal findings: Secondary | ICD-10-CM

## 2019-04-02 DIAGNOSIS — G9009 Other idiopathic peripheral autonomic neuropathy: Secondary | ICD-10-CM | POA: Diagnosis not present

## 2019-04-02 DIAGNOSIS — N182 Chronic kidney disease, stage 2 (mild): Secondary | ICD-10-CM | POA: Diagnosis not present

## 2019-04-02 DIAGNOSIS — Z0000 Encounter for general adult medical examination without abnormal findings: Secondary | ICD-10-CM

## 2019-04-02 DIAGNOSIS — K219 Gastro-esophageal reflux disease without esophagitis: Secondary | ICD-10-CM

## 2019-04-02 DIAGNOSIS — E118 Type 2 diabetes mellitus with unspecified complications: Secondary | ICD-10-CM

## 2019-04-02 DIAGNOSIS — K5730 Diverticulosis of large intestine without perforation or abscess without bleeding: Secondary | ICD-10-CM

## 2019-04-02 DIAGNOSIS — M1710 Unilateral primary osteoarthritis, unspecified knee: Secondary | ICD-10-CM | POA: Diagnosis not present

## 2019-04-02 DIAGNOSIS — Z1389 Encounter for screening for other disorder: Secondary | ICD-10-CM

## 2019-04-02 DIAGNOSIS — I10 Essential (primary) hypertension: Secondary | ICD-10-CM

## 2019-04-02 DIAGNOSIS — Z79899 Other long term (current) drug therapy: Secondary | ICD-10-CM

## 2019-04-02 DIAGNOSIS — I700 Atherosclerosis of aorta: Secondary | ICD-10-CM | POA: Diagnosis not present

## 2019-04-02 MED ORDER — ONDANSETRON HCL 4 MG PO TABS: 4.0000 mg | ORAL_TABLET | Freq: Every day | ORAL | 1 refills | Status: DC | PRN

## 2019-04-02 MED ORDER — PANTOPRAZOLE SODIUM 20 MG PO TBEC: 20.0000 mg | DELAYED_RELEASE_TABLET | Freq: Every day | ORAL | 1 refills | Status: DC

## 2019-04-02 MED ORDER — DICYCLOMINE HCL 20 MG PO TABS: 20.0000 mg | ORAL_TABLET | Freq: Three times a day (TID) | ORAL | 0 refills | Status: DC | PRN

## 2019-04-02 NOTE — Patient Instructions (Addendum)
[name] , Thank you for taking time to come for your Medicare Wellness Visit. I appreciate your ongoing commitment to your health goals. Please review the following plan we discussed and let me know if I can assist you in the future.  This is a list of the screening recommended for you and due dates:  Health Maintenance  Topic Date Due  . Eye exam for diabetics  06/02/2017  . Complete foot exam   03/29/2019  . Tetanus Vaccine  09/22/2021*  . Hemoglobin A1C  06/13/2019  . Flu Shot  06/22/2019  . Urine Protein Check  08/15/2019  . Mammogram  10/26/2019  . Colon Cancer Screening  08/16/2020  . DEXA scan (bone density measurement)  Completed  .  Hepatitis C: One time screening is recommended by Center for Disease Control  (CDC) for  adults born from [birth year] through 1963.   Completed  . Pneumonia vaccines  Completed  *Topic was postponed. The date shown is not the original due date.  Colonoscopy, Due in 2021  We will contact you with your lab results in 1-3 days.  Your Mammogram will be due at the end of the year, last one 10/2018 We will place an order for a Bone Density test, DEXA scan.  Schedule this at the end of the year with your mammogram.   >>>>>>>>>>>>>>>>>>>>>>>>>>>>>>>>>>>>>>>>>>>>>>>>>>>>>>> Coronavirus (COVID-19) Are you at risk?  Are you at risk for the Coronavirus (COVID-19)?  To be considered HIGH RISK for Coronavirus (COVID-19), you have to meet the following criteria:  . Traveled to [place], [place], [place], [place] or [place]; or in the [place] to [place], [place], [place]  . or [place]; and have fever, cough, and shortness of breath within the last 2 weeks of travel OR . Been in close contact with a person diagnosed with COVID-19 within the last 2 weeks and have  . fever, cough,and shortness of breath .  . IF YOU DO NOT MEET THESE CRITERIA, YOU ARE CONSIDERED LOW RISK FOR COVID-19.  What to do if you are HIGH RISK for COVID-19?  Marland Kitchen If you are  having a medical emergency, call 911. . Seek medical care right away. Before you go to a doctor's office, urgent care or emergency department, .  call ahead and tell them about your recent travel, contact with someone diagnosed with COVID-19  .  and your symptoms.  . You should receive instructions from your physician's office regarding next steps of care.  . When you arrive at healthcare provider, tell the healthcare staff immediately you have returned from  . visiting [place], [place], [place], [place] or [place]; or traveled in the [place] to [place], [place],  . [place] or [place] in the last two weeks or you have been in close contact with a person diagnosed with  . COVID-19 in the last 2 weeks.   . Tell the health care staff about your symptoms: fever, cough and shortness of breath. . After you have been seen by a medical provider, you will be either: o Tested for (COVID-19) and discharged home on quarantine except to seek medical care if  o symptoms worsen, and asked to  - Stay home and avoid contact with others until you get your results (4-5 days)  - Avoid travel on public transportation if possible (such as bus, train, or airplane) or o Sent to the Emergency Department by EMS for evaluation, COVID-19 testing  and  o possible admission depending  on your condition and test results.  What to do if you are LOW RISK for COVID-19?  Reduce your risk of any infection by using the same precautions used for avoiding the common cold or flu:  Marland Kitchen Wash your hands often with soap and warm water for at least 20 seconds.  If soap and water are not readily available,  . use an alcohol-based hand sanitizer with at least 60% alcohol.  . If coughing or sneezing, cover your mouth and nose by coughing or sneezing into the elbow areas of your shirt or coat, .  into a tissue or into your sleeve (not your hands). . Avoid shaking hands with others and consider head nods or verbal greetings  only. . Avoid touching your eyes, nose, or mouth with unwashed hands.  . Avoid close contact with people who are sick. . Avoid places or events with large numbers of people in one location, like concerts or sporting events. . Carefully consider travel plans you have or are making. . If you are planning any travel outside or inside the [place], visit the CDC's Travelers' Health webpage for the latest health notices. . If you have some symptoms but not all symptoms, continue to monitor at home and seek medical attention  . if your symptoms worsen. . If you are having a medical emergency, call 911.   . >>>>>>>>>>>>>>>>>>>>>>>>>>>>>>>>> . We Do NOT Approve of  [facility], [employer]  Our Patients  To Do Home Visits & We Do NOT Approve of [employer] > > > > > > > > > > > > > > > > > > > > > > > > > > > > > > > > > > > > > > >  Preventive Care for Adults  A healthy lifestyle and preventive care can promote health and wellness. Preventive health guidelines for women include the following key practices.  A routine yearly physical is a good way to check with your health care provider about your health and preventive screening. It is a chance to share any concerns and updates on your health and to receive a thorough exam.  Visit your dentist for a routine exam and preventive care every 6 months. Brush your teeth twice a day and floss once a day. Good oral hygiene prevents tooth decay and gum disease.  The frequency of eye exams is based on your age, health, family medical history, use of contact lenses, and other factors. Follow your health care provider's recommendations for frequency of eye exams.  Eat a healthy diet. Foods like vegetables, fruits, whole grains, low-fat dairy products, and lean protein foods contain the nutrients you need without too many calories. Decrease your intake of foods high in solid fats, added sugars, and salt. Eat the right amount of calories  for you. Get information about a proper diet from your health care provider, if necessary.  Regular physical exercise is one of the most important things you can do for your health. Most adults should get at least 150 minutes of moderate-intensity exercise (any activity that increases your heart rate and causes you to sweat) each week. In addition, most adults need muscle-strengthening exercises on 2 or more days a week.  Maintain a healthy weight. The body mass index (BMI) is a screening tool to identify possible weight problems. It provides an estimate of body fat based on height and weight. Your health care provider can find your BMI and can help you achieve or maintain  a healthy weight. For adults 20 years and older:  A BMI below 18.5 is considered underweight.  A BMI of 18.5 to 24.9 is normal.  A BMI of 25 to 29.9 is considered overweight.  A BMI of 30 and above is considered obese.  Maintain normal blood lipids and cholesterol levels by exercising and minimizing your intake of saturated fat. Eat a balanced diet with plenty of fruit and vegetables. If your lipid or cholesterol levels are high, you are over 50, or you are at high risk for heart disease, you may need your cholesterol levels checked more frequently. Ongoing high lipid and cholesterol levels should be treated with medicines if diet and exercise are not working.  If you smoke, find out from your health care provider how to quit. If you do not use tobacco, do not start.  Lung cancer screening is recommended for adults aged 46-80 years who are at high risk for developing lung cancer because of a history of smoking. A yearly low-dose CT scan of the lungs is recommended for people who have at least a 30-pack-year history of smoking and are a current smoker or have quit within the past 15 years. A pack year of smoking is smoking an average of 1 pack of cigarettes a day for 1 year (for example: 1 pack a day for 30 years or 2 packs a day  for 15 years). Yearly screening should continue until the smoker has stopped smoking for at least 15 years. Yearly screening should be stopped for people who develop a health problem that would prevent them from having lung cancer treatment.  Avoid use of street drugs. Do not share needles with anyone. Ask for help if you need support or instructions about stopping the use of drugs.  High blood pressure causes heart disease and increases the risk of stroke.  Ongoing high blood pressure should be treated with medicines if weight loss and exercise do not work.  If you are 68-39 years old, ask your health care provider if you should take aspirin to prevent strokes.  Diabetes screening involves taking a blood sample to check your fasting blood sugar level. This should be done once every 3 years, after age 45, if you are within normal weight and without risk factors for diabetes. Testing should be considered at a younger age or be carried out more frequently if you are overweight and have at least 1 risk factor for diabetes.  Breast cancer screening is essential preventive care for women. You should practice "breast self-awareness." This means understanding the normal appearance and feel of your breasts and may include breast self-examination. Any changes detected, no matter how small, should be reported to a health care provider. Women in their 39s and 30s should have a clinical breast exam (CBE) by a health care provider as part of a regular health exam every 1 to 3 years. After age 55, women should have a CBE every year. Starting at age 57, women should consider having a mammogram (breast X-ray test) every year. Women who have a family history of breast cancer should talk to their health care provider about genetic screening. Women at a high risk of breast cancer should talk to their health care providers about having an MRI and a mammogram every year.  Breast cancer gene (BRCA)-related cancer risk  assessment is recommended for women who have family members with BRCA-related cancers. BRCA-related cancers include breast, ovarian, tubal, and peritoneal cancers. Having family members with these cancers may  be associated with an increased risk for harmful changes (mutations) in the breast cancer genes BRCA1 and BRCA2. Results of the assessment will determine the need for genetic counseling and BRCA1 and BRCA2 testing.  Routine pelvic exams to screen for cancer are no longer recommended for nonpregnant women who are considered low risk for cancer of the pelvic organs (ovaries, uterus, and vagina) and who do not have symptoms. Ask your health care provider if a screening pelvic exam is right for you.  If you have had past treatment for cervical cancer or a condition that could lead to cancer, you need Pap tests and screening for cancer for at least 20 years after your treatment. If Pap tests have been discontinued, your risk factors (such as having a new sexual partner) need to be reassessed to determine if screening should be resumed. Some women have medical problems that increase the chance of getting cervical cancer. In these cases, your health care provider may recommend more frequent screening and Pap tests.    Colorectal cancer can be detected and often prevented. Most routine colorectal cancer screening begins at the age of 69 years and continues through age 11 years. However, your health care provider may recommend screening at an earlier age if you have risk factors for colon cancer. On a yearly basis, your health care provider may provide home test kits to check for hidden blood in the stool. Use of a small camera at the end of a tube, to directly examine the colon (sigmoidoscopy or colonoscopy), can detect the earliest forms of colorectal cancer. Talk to your health care provider about this at age 71, when routine screening begins.  Direct exam of the colon should be repeated every 5-10 years  through age 110 years, unless early forms of pre-cancerous polyps or small growths are found.  Osteoporosis is a disease in which the bones lose minerals and strength with aging. This can result in serious bone fractures or breaks. The risk of osteoporosis can be identified using a bone density scan. Women ages 51 years and over and women at risk for fractures or osteoporosis should discuss screening with their health care providers. Ask your health care provider whether you should take a calcium supplement or vitamin D to reduce the rate of osteoporosis.  Menopause can be associated with physical symptoms and risks. Hormone replacement therapy is available to decrease symptoms and risks. You should talk to your health care provider about whether hormone replacement therapy is right for you.  Use sunscreen. Apply sunscreen liberally and repeatedly throughout the day. You should seek shade when your shadow is shorter than you. Protect yourself by wearing long sleeves, pants, a wide-brimmed hat, and sunglasses year round, whenever you are outdoors.  Once a month, do a whole body skin exam, using a mirror to look at the skin on your back. Tell your health care provider of new moles, moles that have irregular borders, moles that are larger than a pencil eraser, or moles that have changed in shape or color.  Stay current with required vaccines (immunizations).  Influenza vaccine. All adults should be immunized every year.  Tetanus, diphtheria, and acellular pertussis (Td, Tdap) vaccine. Pregnant women should receive 1 dose of Tdap vaccine during each pregnancy. The dose should be obtained regardless of the length of time since the last dose. Immunization is preferred during the 27th-36th week of gestation. An adult who has not previously received Tdap or who does not know her vaccine status should receive  1 dose of Tdap. This initial dose should be followed by tetanus and diphtheria toxoids (Td) booster  doses every 10 years. Adults with an unknown or incomplete history of completing a 3-dose immunization series with Td-containing vaccines should begin or complete a primary immunization series including a Tdap dose. Adults should receive a Td booster every 10 years.    Zoster vaccine. One dose is recommended for adults aged 34 years or older unless certain conditions are present.    Pneumococcal 13-valent conjugate (PCV13) vaccine. When indicated, a person who is uncertain of her immunization history and has no record of immunization should receive the PCV13 vaccine. An adult aged 29 years or older who has certain medical conditions and has not been previously immunized should receive 1 dose of PCV13 vaccine. This PCV13 should be followed with a dose of pneumococcal polysaccharide (PPSV23) vaccine. The PPSV23 vaccine dose should be obtained at least 1 or more year(s) after the dose of PCV13 vaccine. An adult aged 73 years or older who has certain medical conditions and previously received 1 or more doses of PPSV23 vaccine should receive 1 dose of PCV13. The PCV13 vaccine dose should be obtained 1 or more years after the last PPSV23 vaccine dose.    Pneumococcal polysaccharide (PPSV23) vaccine. When PCV13 is also indicated, PCV13 should be obtained first. All adults aged 34 years and older should be immunized. An adult younger than age 43 years who has certain medical conditions should be immunized. Any person who resides in a nursing home or long-term care facility should be immunized. An adult smoker should be immunized. People with an immunocompromised condition and certain other conditions should receive both PCV13 and PPSV23 vaccines. People with human immunodeficiency virus (HIV) infection should be immunized as soon as possible after diagnosis. Immunization during chemotherapy or radiation therapy should be avoided. Routine use of PPSV23 vaccine is not recommended for American Indians, [place]  Natives, or people younger than 65 years unless there are medical conditions that require PPSV23 vaccine. When indicated, people who have unknown immunization and have no record of immunization should receive PPSV23 vaccine. One-time revaccination 5 years after the first dose of PPSV23 is recommended for people aged 19-64 years who have chronic kidney failure, nephrotic syndrome, asplenia, or immunocompromised conditions. People who received 1-2 doses of PPSV23 before age 37 years should receive another dose of PPSV23 vaccine at age 25 years or later if at least 5 years have passed since the previous dose. Doses of PPSV23 are not needed for people immunized with PPSV23 at or after age 26 years.   Preventive Services / Frequency  Ages 58 years and over  Blood pressure check.  Lipid and cholesterol check.  Lung cancer screening. / Every year if you are aged 43-80 years and have a 30-pack-year history of smoking and currently smoke or have quit within the past 15 years. Yearly screening is stopped once you have quit smoking for at least 15 years or develop a health problem that would prevent you from having lung cancer treatment.  Clinical breast exam.** / Every year after age 21 years.   BRCA-related cancer risk assessment.** / For women who have family members with a BRCA-related cancer (breast, ovarian, tubal, or peritoneal cancers).  Mammogram.** / Every year beginning at age 51 years and continuing for as long as you are in good health. Consult with your health care provider.  Pap test.** / Every 3 years starting at age 72 years through age 58 or 70  years with 3 consecutive normal Pap tests. Testing can be stopped between 65 and 70 years with 3 consecutive normal Pap tests and no abnormal Pap or HPV tests in the past 10 years.  Fecal occult blood test (FOBT) of stool. / Every year beginning at age 37 years and continuing until age 5 years. You may not need to do this test if you get a  colonoscopy every 10 years.  Flexible sigmoidoscopy or colonoscopy.** / Every 5 years for a flexible sigmoidoscopy or every 10 years for a colonoscopy beginning at age 14 years and continuing until age 38 years.  Hepatitis C blood test.** / For all people born from [birth year] through 1963 and any individual with known risks for hepatitis C.  Osteoporosis screening.** / A one-time screening for women ages 64 years and over and women at risk for fractures or osteoporosis.  Skin self-exam. / Monthly.  Influenza vaccine. / Every year.  Tetanus, diphtheria, and acellular pertussis (Tdap/Td) vaccine.** / 1 dose of Td every 10 years.  Zoster vaccine.** / 1 dose for adults aged 39 years or older.  Pneumococcal 13-valent conjugate (PCV13) vaccine.** / Consult your health care provider.  Pneumococcal polysaccharide (PPSV23) vaccine.** / 1 dose for all adults aged 13 years and older. Screening for abdominal aortic aneurysm (AAA)  by ultrasound is recommended for people who have history of high blood pressure or who are current or former smokers. ++++++++++++++++++++ Recommend Adult Low Dose Aspirin or  coated  Aspirin 81 mg daily  To reduce risk of Colon Cancer 20 %,  Skin Cancer 26 % ,  Melanoma 46%  and  Pancreatic cancer 60% ++++++++++++++++++++ Vitamin D goal  is between 70-100.  Please make sure that you are taking your Vitamin D as directed.  It is very important as a natural anti-inflammatory  helping hair, skin, and nails, as well as reducing stroke and heart attack risk.  It helps your bones and helps with mood. It also decreases numerous cancer risks so please take it as directed.  Low Vit D is associated with a 200-300% higher risk for CANCER  and 200-300% higher risk for HEART   ATTACK  &  STROKE.   .....................................Marland Kitchen It is also associated with higher death rate at younger ages,  autoimmune diseases like Rheumatoid arthritis, Lupus, Multiple Sclerosis.    Also  many other serious conditions, like depression, Alzheimer's Dementia, infertility, muscle aches, fatigue, fibromyalgia - just to name a few. ++++++++++++++++++ Recommend the book "The END of DIETING" by Dr Excell Seltzer  & the book "The END of DIABETES " by Dr Excell Seltzer At [email or web address] - get book & Audio CD's    Being diabetic has a  300% increased risk for heart attack, stroke, cancer, and alzheimer- type vascular dementia. It is very important that you work harder with diet by avoiding all foods that are white. Avoid white rice (brown & wild rice is OK), white potatoes (sweetpotatoes in moderation is OK), White bread or wheat bread or anything made out of white flour like bagels, donuts, rolls, buns, biscuits, cakes, pastries, cookies, pizza crust, and pasta (made from white flour & egg whites) - vegetarian pasta or spinach or wheat pasta is OK. Multigrain breads like Arnold's or Pepperidge [employer], or multigrain sandwich thins or flatbreads.  Diet, exercise and weight loss can reverse and cure diabetes in the early stages.  Diet, exercise and weight loss is very important in the control and prevention of complications of diabetes which affects every  system in your body, ie. Brain - dementia/stroke, eyes - glaucoma/blindness, heart - heart attack/heart failure, kidneys - dialysis, stomach - gastric paralysis, intestines - malabsorption, nerves - severe painful neuritis, circulation - gangrene & loss of a leg(s), and finally cancer and Alzheimers.    I recommend avoid fried & greasy foods,  sweets/candy, white rice (brown or wild rice or Quinoa is OK), white potatoes (sweet potatoes are OK) - anything made from white flour - bagels, doughnuts, rolls, buns, biscuits,white and wheat breads, pizza crust and traditional pasta made of white flour & egg white(vegetarian pasta or spinach or wheat pasta is OK).  Multi-grain bread is OK - like multi-grain flat bread or sandwich thins. Avoid alcohol in excess. Exercise  is also important.    Eat all the vegetables you want - avoid meat, especially red meat and dairy - especially cheese.  Cheese is the most concentrated form of trans-fats which is the worst thing to clog up our arteries. Veggie cheese is OK which can be found in the fresh produce section at [employer] or [employer] or [employer]  +++++++++++++++++++ DASH Eating Plan  DASH stands for "Dietary Approaches to Stop Hypertension."   The DASH eating plan is a healthy eating plan that has been shown to reduce high blood pressure (hypertension). Additional health benefits may include reducing the risk of type 2 diabetes mellitus, heart disease, and stroke. The DASH eating plan may also help with weight loss. WHAT DO I NEED TO KNOW ABOUT THE DASH EATING PLAN? For the DASH eating plan, you will follow these general guidelines:  Choose foods with a percent daily value for sodium of less than 5% (as listed on the food label).  Use salt-free seasonings or herbs instead of table salt or sea salt.  Check with your health care provider or pharmacist before using salt substitutes.  Eat lower-sodium products, often labeled as "lower sodium" or "no salt added."  Eat fresh foods.  Eat more vegetables, fruits, and low-fat dairy products.  Choose whole grains. Look for the word "whole" as the first word in the ingredient list.  Choose fish   Limit sweets, desserts, sugars, and sugary drinks.  Choose heart-healthy fats.  Eat veggie cheese   Eat more home-cooked food and less restaurant, buffet, and fast food.  Limit fried foods.  Cook foods using methods other than frying.  Limit canned vegetables. If you do use them, rinse them well to decrease the sodium.  When eating at a restaurant, ask that your food be prepared with less salt, or no salt if possible.                      WHAT FOODS CAN I EAT? Read Dr Fara Olden Fuhrman's books on The End of Dieting & The End of Diabetes  Grains Whole  grain or whole wheat bread. Brown rice. Whole grain or whole wheat pasta. Quinoa, bulgur, and whole grain cereals. Low-sodium cereals. Corn or whole wheat flour tortillas. Whole grain cornbread. Whole grain crackers. Low-sodium crackers.  Vegetables Fresh or frozen vegetables (raw, steamed, roasted, or grilled). Low-sodium or reduced-sodium tomato and vegetable juices. Low-sodium or reduced-sodium tomato sauce and paste. Low-sodium or reduced-sodium canned vegetables.   Fruits All fresh, canned (in natural juice), or frozen fruits.  Protein Products  All fish and seafood.  Dried beans, peas, or lentils. Unsalted nuts and seeds. Unsalted canned beans.  Dairy Low-fat dairy products, such as skim or 1% milk, 2% or reduced-fat cheeses,  low-fat ricotta or cottage cheese, or plain low-fat yogurt. Low-sodium or reduced-sodium cheeses.  Fats and Oils Tub margarines without trans fats. Light or reduced-fat mayonnaise and salad dressings (reduced sodium). Avocado. Safflower, olive, or canola oils. Natural peanut or almond butter.  Other Unsalted popcorn and pretzels. The items listed above may not be a complete list of recommended foods or beverages. Contact your dietitian for more options.  +++++++++++++++  WHAT FOODS ARE NOT RECOMMENDED? Grains/ White flour or wheat flour White bread. White pasta. White rice. Refined cornbread. Bagels and croissants. Crackers that contain trans fat.  Vegetables  Creamed or fried vegetables. Vegetables in a . Regular canned vegetables. Regular canned tomato sauce and paste. Regular tomato and vegetable juices.  Fruits Dried fruits. Canned fruit in light or heavy syrup. Fruit juice.  Meat and Other Protein Products Meat in general - RED meat & White meat.  Fatty cuts of meat. Ribs, chicken wings, all processed meats as bacon, sausage, bologna, salami, fatback, hot dogs, bratwurst and packaged luncheon meats.  Dairy Whole or 2% milk, cream, half-and-half,  and cream cheese. Whole-fat or sweetened yogurt. Full-fat cheeses or blue cheese. Non-dairy creamers and whipped toppings. Processed cheese, cheese spreads, or cheese curds.  Condiments Onion and garlic salt, seasoned salt, table salt, and sea salt. Canned and packaged gravies. Worcestershire sauce. Tartar sauce. Barbecue sauce. Teriyaki sauce. Soy sauce, including reduced sodium. Steak sauce. Fish sauce. Oyster sauce. Cocktail sauce. Horseradish. Ketchup and mustard. Meat flavorings and tenderizers. Bouillon cubes. Hot sauce. Tabasco sauce. Marinades. Taco seasonings. Relishes.  Fats and Oils Butter, stick margarine, lard, shortening and bacon fat. Coconut, palm kernel, or palm oils. Regular salad dressings.  Pickles and olives. Salted popcorn and pretzels.  The items listed above may not be a complete list of foods and beverages to avoid.

## 2019-04-03 ENCOUNTER — Telehealth: Payer: Self-pay | Admitting: *Deleted

## 2019-04-03 ENCOUNTER — Other Ambulatory Visit: Payer: Self-pay | Admitting: *Deleted

## 2019-04-03 DIAGNOSIS — K5730 Diverticulosis of large intestine without perforation or abscess without bleeding: Secondary | ICD-10-CM

## 2019-04-03 DIAGNOSIS — R102 Pelvic and perineal pain: Secondary | ICD-10-CM

## 2019-04-03 DIAGNOSIS — R1030 Lower abdominal pain, unspecified: Secondary | ICD-10-CM

## 2019-04-03 LAB — HEMOGLOBIN A1C
Hgb A1c MFr Bld: 5.9 % of total Hgb — ABNORMAL HIGH (ref <5.7)
Mean Plasma Glucose: 123 (calc)
eAG (mmol/L): 6.8 (calc)

## 2019-04-03 LAB — LIPID PANEL
Cholesterol: 156 mg/dL (ref <200)
HDL: 37 mg/dL — ABNORMAL LOW (ref >=50)
LDL Cholesterol (Calc): 82 mg/dL (calc)
Non-HDL Cholesterol (Calc): 119 mg/dL (calc) (ref <130)
Total CHOL/HDL Ratio: 4.2 (calc) (ref <5.0)
Triglycerides: 281 mg/dL — ABNORMAL HIGH (ref <150)

## 2019-04-03 LAB — CBC WITH DIFFERENTIAL/PLATELET
Absolute Monocytes: 697 cells/uL (ref 200–950)
Basophils Absolute: 51 cells/uL (ref 0–200)
Basophils Relative: 0.5 %
Eosinophils Absolute: 222 cells/uL (ref 15–500)
Eosinophils Relative: 2.2 %
HCT: 41.2 % (ref 35.0–45.0)
Hemoglobin: 13.6 g/dL (ref 11.7–15.5)
Lymphs Abs: 2646 cells/uL (ref 850–3900)
MCH: 28.2 pg (ref 27.0–33.0)
MCHC: 33 g/dL (ref 32.0–36.0)
MCV: 85.5 fL (ref 80.0–100.0)
MPV: 11.1 fL (ref 7.5–12.5)
Monocytes Relative: 6.9 %
Neutro Abs: 6484 cells/uL (ref 1500–7800)
Neutrophils Relative %: 64.2 %
Platelets: 289 10*3/uL (ref 140–400)
RBC: 4.82 10*6/uL (ref 3.80–5.10)
RDW: 13.5 % (ref 11.0–15.0)
Total Lymphocyte: 26.2 %
WBC: 10.1 10*3/uL (ref 3.8–10.8)

## 2019-04-03 LAB — VITAMIN D 25 HYDROXY (VIT D DEFICIENCY, FRACTURES): Vit D, 25-Hydroxy: 46 ng/mL (ref 30–100)

## 2019-04-03 LAB — COMPLETE METABOLIC PANEL WITH GFR
AG Ratio: 1.7 (calc) (ref 1.0–2.5)
ALT: 18 U/L (ref 6–29)
AST: 23 U/L (ref 10–35)
Albumin: 4 g/dL (ref 3.6–5.1)
Alkaline phosphatase (APISO): 96 U/L (ref 37–153)
BUN/Creatinine Ratio: 14 (calc) (ref 6–22)
BUN: 14 mg/dL (ref 7–25)
CO2: 25 mmol/L (ref 20–32)
Calcium: 9 mg/dL (ref 8.6–10.4)
Chloride: 108 mmol/L (ref 98–110)
Creat: 1.03 mg/dL — ABNORMAL HIGH (ref 0.60–0.93)
GFR, Est African American: 63 mL/min/{1.73_m2} (ref >=60)
GFR, Est Non African American: 55 mL/min/{1.73_m2} — ABNORMAL LOW (ref >=60)
Globulin: 2.4 g/dL (calc) (ref 1.9–3.7)
Glucose, Bld: 86 mg/dL (ref 65–99)
Potassium: 3.9 mmol/L (ref 3.5–5.3)
Sodium: 142 mmol/L (ref 135–146)
Total Bilirubin: 0.6 mg/dL (ref 0.2–1.2)
Total Protein: 6.4 g/dL (ref 6.1–8.1)

## 2019-04-03 LAB — MAGNESIUM: Magnesium: 2.1 mg/dL (ref 1.5–2.5)

## 2019-04-03 LAB — TSH: TSH: 0.78 mIU/L (ref 0.40–4.50)

## 2019-04-03 LAB — INSULIN, RANDOM: Insulin: 15.9 u[IU]/mL

## 2019-04-03 MED ORDER — DICYCLOMINE HCL 20 MG PO TABS: 20.0000 mg | ORAL_TABLET | Freq: Three times a day (TID) | ORAL | 0 refills | Status: DC | PRN

## 2019-04-03 NOTE — Telephone Encounter (Signed)
A message was left for [facility], with [employer] [phone]) to keep the Zofran refill for #30 and 1 1 refill and increase the Dicyclomine 20 mg tablet to #270, per Dr Melford Aase.

## 2019-04-15 IMAGING — CT CT ABD-PELV W/ CM
2 of 5 series · 16 of 46 positions shown, 18 images · IV contrast (APPLIED)
Comparison: CT scan of February 03, 2010.

CLINICAL DATA: Acute lower abdominal pain.

EXAM:
CT ABDOMEN AND PELVIS WITH CONTRAST
TECHNIQUE: Multidetector CT imaging of the abdomen and pelvis was performed
using the standard protocol following bolus administration of
intravenous contrast.
CONTRAST:  100mL [id number] IOPAMIDOL (X3A0AY-PBB) INJECTION 61%

[Series 2: abd/pelvis w/cm · axial · 0.93mm/px · z∈[-556,-121]mm · 13 of 99 slices shown, 15 images]
[im 6/99  soft-tissue]
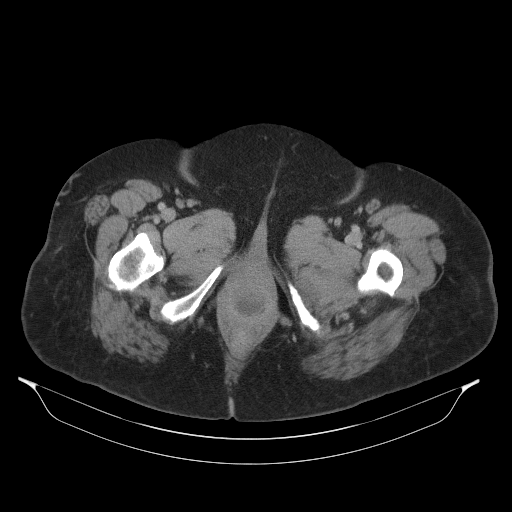
[im 6/99  bone]
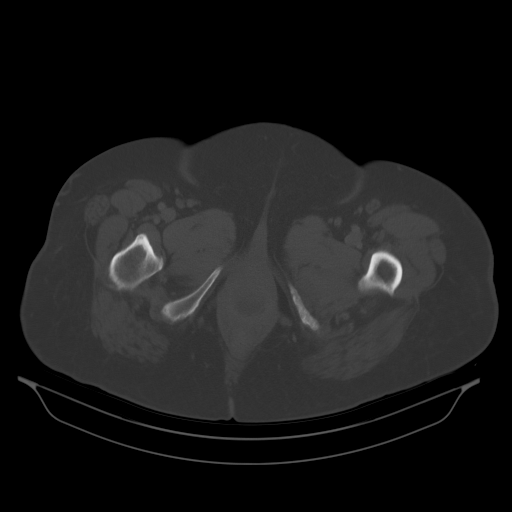
[im 12/99  soft-tissue]
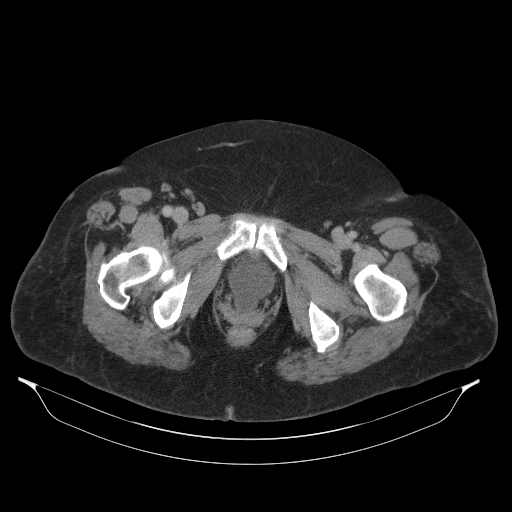
[im 24/99  soft-tissue]
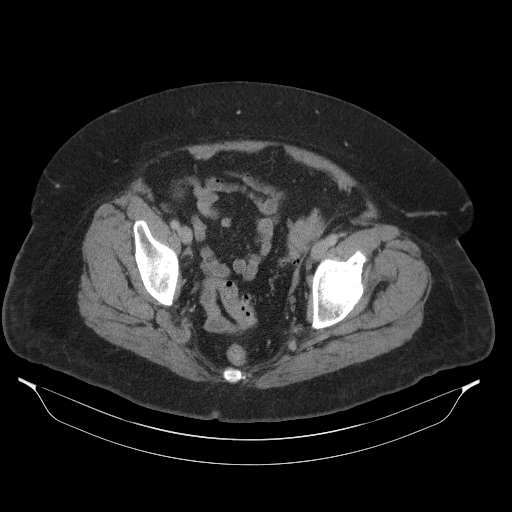
[im 29/99  soft-tissue]
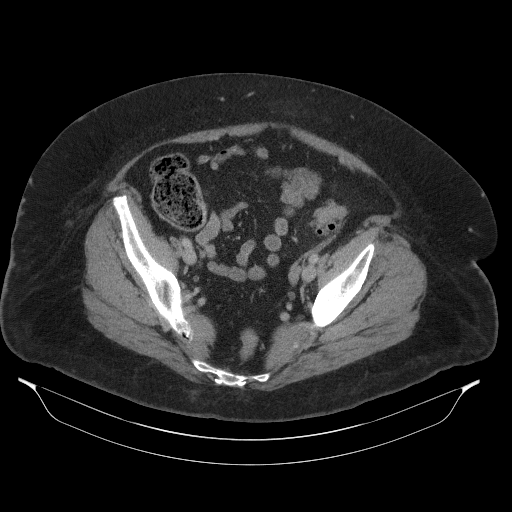
[im 35/99  soft-tissue]
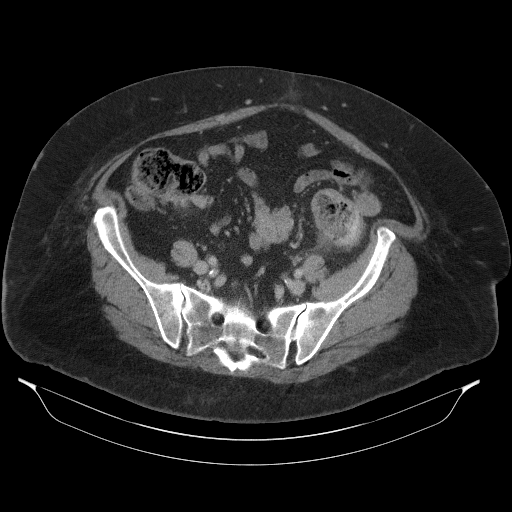
[im 41/99  soft-tissue]
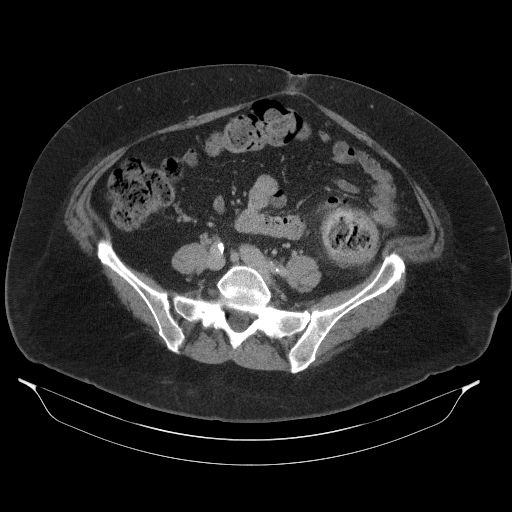
[im 52/99  soft-tissue]
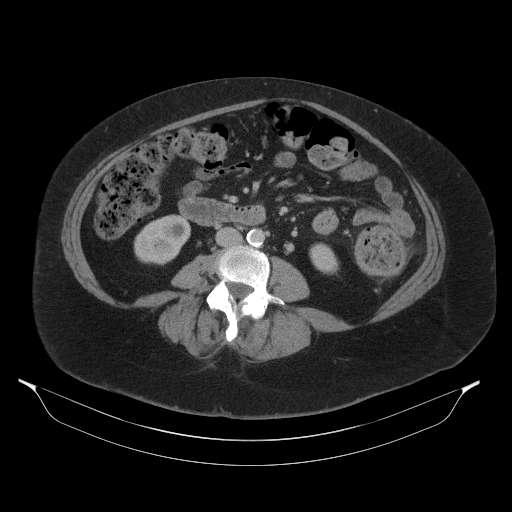
[im 58/99  soft-tissue]
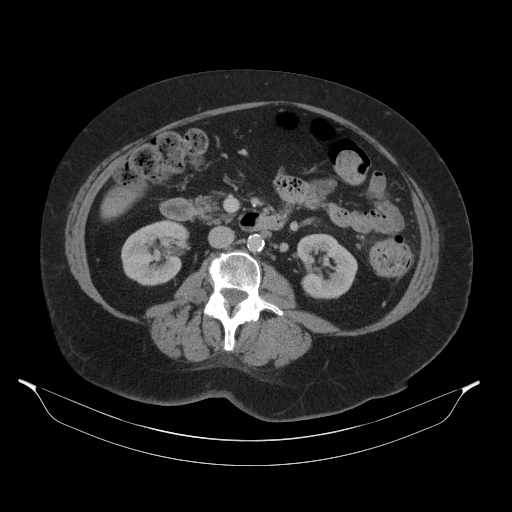
[im 64/99  soft-tissue]
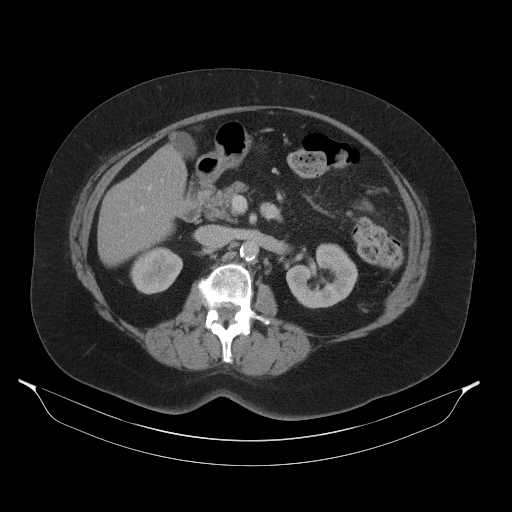
[im 64/99  bone]
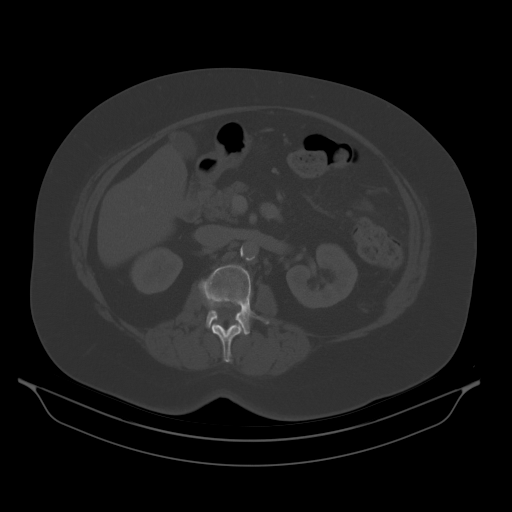
[im 70/99  soft-tissue]
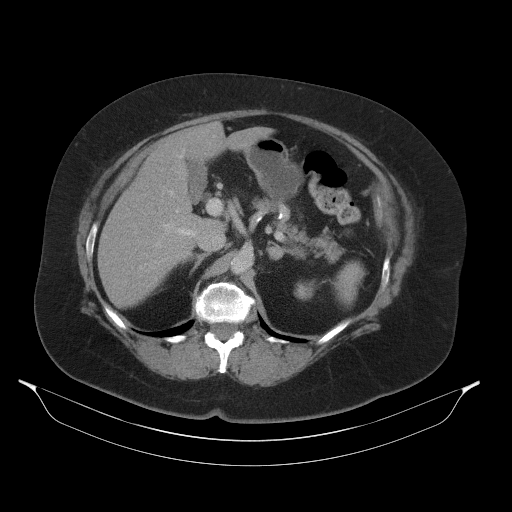
[im 75/99  soft-tissue]
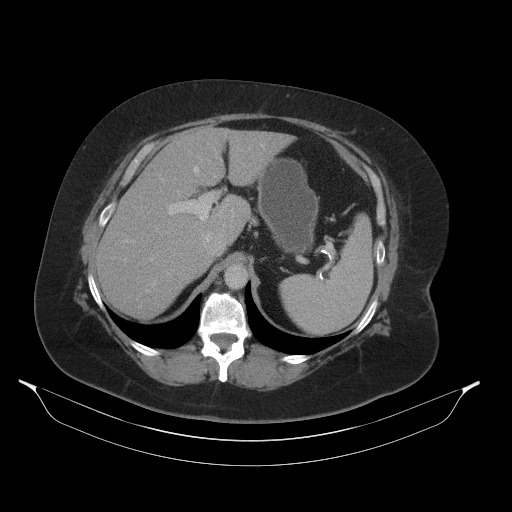
[im 87/99  soft-tissue]
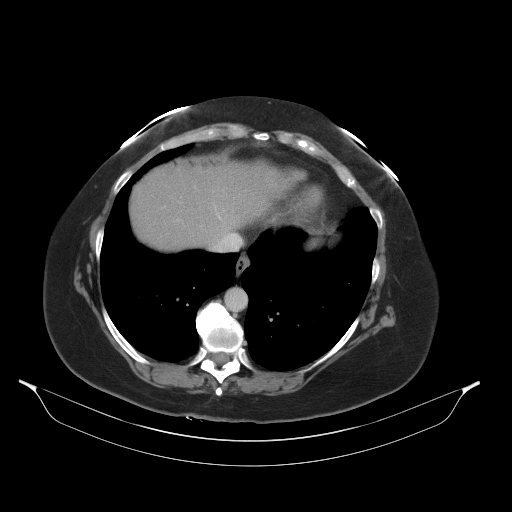
[im 93/99  soft-tissue]
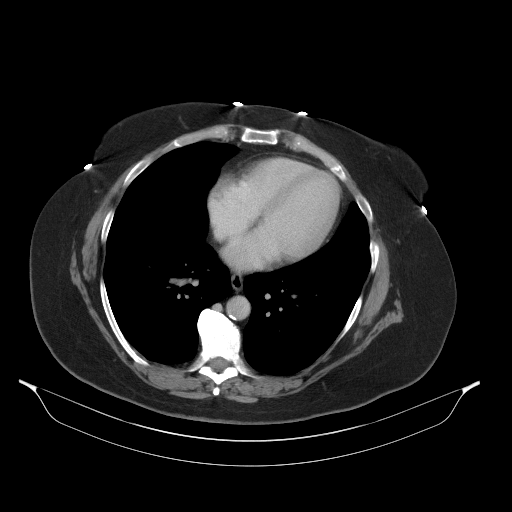

[Series 3: cor · coronal · 0.90mm/px · 3 of 108 slices shown]
[im 36/108  soft-tissue]
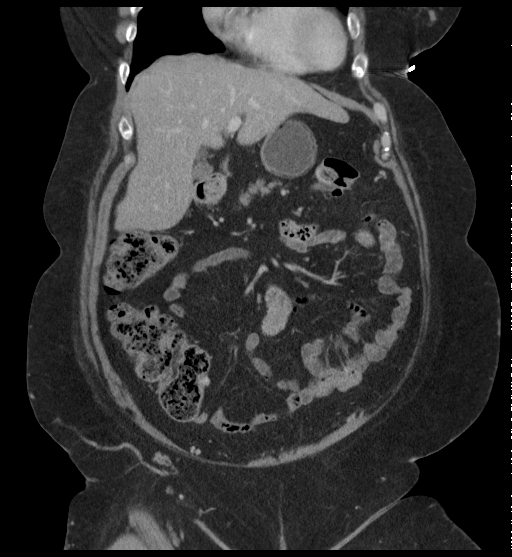
[im 48/108  soft-tissue]
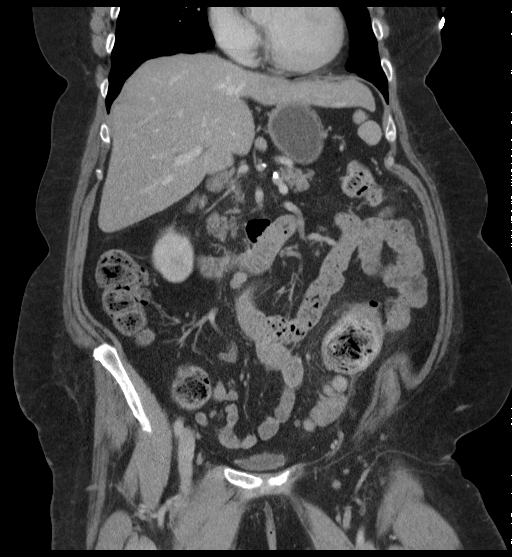
[im 60/108  soft-tissue]
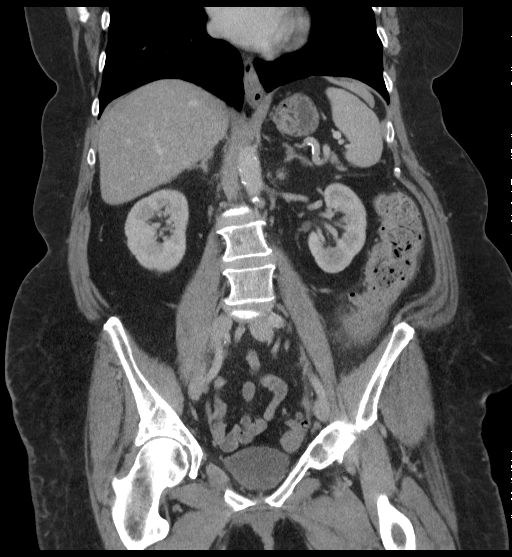

[16 of 46 positions shown; findings below may reference images not displayed]

FINDINGS: Lower chest: No acute abnormality.

Hepatobiliary: No focal liver abnormality is seen. No gallstones,
gallbladder wall thickening, or biliary dilatation.

Pancreas: Unremarkable. No pancreatic ductal dilatation or
surrounding inflammatory changes.

Spleen: Normal in size without focal abnormality.

Adrenals/Urinary Tract: Adrenal glands are unremarkable. Kidneys are
normal, without renal calculi, focal lesion, or hydronephrosis.
Bladder is unremarkable.

Stomach/Bowel: Stool is noted throughout the colon. Diverticulosis
of descending and sigmoid colon is noted. Inflammatory changes and
wall thickening are seen involving the descending colon to the level
of the junction of the descending and sigmoid colon. It is uncertain
if this represents colitis or diverticulitis. There is noted
significant change in caliber of the colon at this point which was
present on prior exam. This is concerning for stricture.

Vascular/Lymphatic: Aortic atherosclerosis. No enlarged abdominal or
pelvic lymph nodes.

Reproductive: Status post hysterectomy. No adnexal masses.

Other: No abdominal wall hernia or abnormality. No abdominopelvic
ascites.

Musculoskeletal: No acute or significant osseous findings.
IMPRESSION: Aortic atherosclerosis.

Wall thickening and inflammatory changes are seen involving the
descending colon concerning for colitis or diverticulitis. Stool is
noted throughout the colon, and there is dilatation of descending
colon to the junction of the descending and sigmoid colon. This is
unchanged compared to prior exam, and is concerning for stricture
involving the descending colon in this area. Barium enema or
sigmoidoscopy is recommended for further evaluation. These results
will be called to the ordering clinician or representative by the
Radiologist Assistant, and communication documented in the PACS or
[facility] Dashboard.

## 2019-04-23 ENCOUNTER — Ambulatory Visit: Payer: PPO | Admitting: Podiatry

## 2019-04-23 ENCOUNTER — Encounter: Payer: Self-pay | Admitting: Podiatry

## 2019-04-23 ENCOUNTER — Other Ambulatory Visit: Payer: Self-pay

## 2019-04-23 VITALS — Temp 97.5°F

## 2019-04-23 DIAGNOSIS — L84 Corns and callosities: Secondary | ICD-10-CM | POA: Diagnosis not present

## 2019-04-23 DIAGNOSIS — B351 Tinea unguium: Secondary | ICD-10-CM | POA: Diagnosis not present

## 2019-04-23 DIAGNOSIS — G629 Polyneuropathy, unspecified: Secondary | ICD-10-CM | POA: Diagnosis not present

## 2019-04-23 DIAGNOSIS — M79675 Pain in left toe(s): Secondary | ICD-10-CM

## 2019-04-23 DIAGNOSIS — M79674 Pain in right toe(s): Secondary | ICD-10-CM | POA: Diagnosis not present

## 2019-04-23 NOTE — Patient Instructions (Signed)

## 2019-04-28 ENCOUNTER — Encounter: Payer: Self-pay | Admitting: Podiatry

## 2019-04-28 NOTE — Progress Notes (Signed)
Subjective: [name] presents for preventative foot care on today with painful, thick toenails 1-5 b/l that she cannot cut and which interfere with daily activities.    She also has chronic callus and corns which pose a risk due to her diabetes.   She continues to take gabapentin for neuropathy.  She voices no new pedal concerns on today's visit.  Unk Pinto, MD is her PCP and last visit was 01/31/2019.   Current Outpatient Medications:  .  Acetaminophen (TYLENOL ARTHRITIS PAIN PO), Take by mouth., Disp: , Rfl:  .  aspirin 81 MG tablet, Take 81 mg by mouth daily., Disp: , Rfl:  .  atorvastatin (LIPITOR) 40 MG tablet, Take 1 tablet by mouth at bedtime for cholesterol, Disp: 90 tablet, Rfl: 1 .  b complex vitamins tablet, Take 1 tablet by mouth daily., Disp: , Rfl:  .  dicyclomine (BENTYL) 20 MG tablet, Take 1 tablet (20 mg total) by mouth 3 (three) times daily as needed for spasms., Disp: 270 tablet, Rfl: 0 .  diltiazem (DILT-XR) 120 MG 24 hr capsule, Take 1 capsule (120 mg total) by mouth daily., Disp: 90 capsule, Rfl: 1 .  dorzolamide-timolol (COSOPT) 22.3-6.8 MG/ML ophthalmic solution, USE 1 DROP INTO THE AFFECTED EYE 2 TIMES DAILY, Disp: , Rfl: 1 .  furosemide (LASIX) 20 MG tablet, Take 1 tablet (20 mg total) by mouth 2 (two) times daily., Disp: 180 tablet, Rfl: 1 .  gabapentin (NEURONTIN) 800 MG tablet, Take 1 tablet (800 mg total) by mouth 3 (three) times daily., Disp: 90 tablet, Rfl: 3 .  levothyroxine (SYNTHROID, LEVOTHROID) 75 MCG tablet, Take 1 tablet by mouth 3 days a week and Take 1 & 1/2 tablets by mouth 4 days a week or as directed, Disp: 114 tablet, Rfl: 0 .  meloxicam (MOBIC) 7.5 MG tablet, TAKE 1 TABLET DAILY WITH   FOOD FOR PAIN CAN TAKE A SECOND PILL LATER IN THE DAY IF YOU NEED IT, Disp: 180 tablet, Rfl: 1 .  Multiple Vitamin (MULTIVITAMIN) tablet, Take 1 tablet by mouth daily., Disp: , Rfl:  .  ondansetron (ZOFRAN) 4 MG tablet, Take 1 tablet (4 mg total) by  mouth daily as needed for nausea or vomiting., Disp: 30 tablet, Rfl: 1 .  pantoprazole (PROTONIX) 20 MG tablet, Take 1 tablet (20 mg total) by mouth daily., Disp: 90 tablet, Rfl: 1  Allergies  Allergen Reactions  . Ciprofloxacin   . Macrobid [Nitrofurantoin Macrocrystal]   . Penicillins     REACTION: rash  . Procaine Hcl     REACTION: difficulty breathing  . Sulfonamide Derivatives     REACTION: hives  . Tetanus Toxoid     REACTION: arm swells    Objective: Vitals:   04/23/19 1551  Temp: (!) 97.5 F (36.4 C)   Vascular Examination: Capillary refill time <3 seconds  x 10 digits.  Dorsalis pedis and Posterior tibial pulses palpable b/l.  Digital hair absent x 10 digits.  Skin temperature gradient WNL b/l.  Dermatological Examination: Skin with normal turgor, texture and tone b/l.  Toenails 1-5 b/l discolored, thick, dystrophic with subungual debris and pain with palpation to nailbeds due to thickness of nails.  Hyperkeratotic lesions submet head 5 left, distal tip b/l hallux, b/l 2nd digits and right 3rd digit. No erythema, no edema, no drainage, no flocculence noted.  Musculoskeletal: Muscle strength 5/5 to all LE muscle groups  Contracted digits 1-4 b/l.  No pain, crepitus or joint limitation noted with ROM.  Neurological: Sensation absent with 10 gram monofilament.  Vibratory sensation absent b/l.  Hemoglobin A1C Latest Ref Rng & Units 04/02/2019 12/13/2018 08/14/2018  HGBA1C <5.7 % of total Hgb 5.9(H) 5.9(H) 6.3(H)  Some recent data might be hidden    Assessment: 1. Painful onychomycosis toenails 1-5 b/l  2. Corns b/l 2nd, right 3rd digit 3. Callus b/l hallux and submet head 5 left foot 4. NIDDM 5. Peripheral neuropathy   Plan: 1. Continue daily foot inspection. 2. Toenails 1-5 b/l were debrided in length and girth without iatrogenic bleeding. 3. Callus pared b/l hallux and submetatarsal head(s) 5 left footutilizing sterile scalpel blade without  incident. 4. Corn(s) pared b/l 2nd, right 3rd digits utilizing sterile scalpel blade without incident. 5. Patient to continue soft, supportive shoe gear daily. 6. Patient to report any pedal injuries to medical professional immediately. 7. Follow up 9 weeks. 8. Patient/POA to call should there be a concern in the interim.

## 2019-05-17 ENCOUNTER — Other Ambulatory Visit: Payer: Self-pay | Admitting: Internal Medicine

## 2019-05-17 ENCOUNTER — Other Ambulatory Visit: Payer: Self-pay | Admitting: Adult Health

## 2019-05-17 MED ORDER — LEVOTHYROXINE SODIUM 75 MCG PO TABS: ORAL_TABLET | ORAL | 3 refills | Status: DC

## 2019-06-28 ENCOUNTER — Ambulatory Visit: Payer: PPO | Admitting: Podiatry

## 2019-07-02 ENCOUNTER — Other Ambulatory Visit: Payer: Self-pay | Admitting: Internal Medicine

## 2019-07-02 DIAGNOSIS — E785 Hyperlipidemia, unspecified: Secondary | ICD-10-CM

## 2019-07-02 DIAGNOSIS — I10 Essential (primary) hypertension: Secondary | ICD-10-CM

## 2019-07-02 MED ORDER — FUROSEMIDE 20 MG PO TABS: ORAL_TABLET | ORAL | 3 refills | Status: DC

## 2019-07-02 MED ORDER — ATORVASTATIN CALCIUM 40 MG PO TABS: ORAL_TABLET | ORAL | 3 refills | Status: DC

## 2019-07-03 ENCOUNTER — Other Ambulatory Visit: Payer: Self-pay | Admitting: Physician Assistant

## 2019-07-03 DIAGNOSIS — I10 Essential (primary) hypertension: Secondary | ICD-10-CM

## 2019-07-03 MED ORDER — DILTIAZEM HCL ER 120 MG PO CP24: 120.0000 mg | ORAL_CAPSULE | Freq: Every day | ORAL | 1 refills | Status: DC

## 2019-07-03 NOTE — Progress Notes (Signed)
Future Appointments  Date Time Provider [place]  07/09/2019  3:45 PM Marzetta Board, DPM [facility]  07/10/2019  3:45 PM Vicie Mutters, PA-C [facility] None  10/10/2019  2:00 PM Vicie Mutters, PA-C [facility] None  04/21/2020  2:00 PM Garnet Sierras, NP [facility] None

## 2019-07-08 NOTE — Progress Notes (Signed)
3 month follow up Assessment and Plan:  Essential hypertension - continue medications, DASH diet, exercise and monitor at home. Call if greater than 130/80.  -     CBC with Differential/Platelet -     BASIC METABOLIC PANEL WITH GFR -     Hepatic function panel  Hypothyroidism, unspecified type Hypothyroidism-check TSH level, continue medications the same, reminded to take on an empty stomach 30-80mins before food.  -     levothyroxine (SYNTHROID) 125 MCG tablet; 1 pill daily -     TSH  Controlled type 2 diabetes mellitus with complication, without long-term current use of insulin ([place]) Discussed general issues about diabetes pathophysiology and management., Educational material distributed., Suggested low cholesterol diet., Encouraged aerobic exercise., Discussed foot care., Reminded to get yearly retinal exam. -     Hemoglobin A1c  Hyperlipidemia, unspecified hyperlipidemia type -continue medications, check lipids, decrease fatty foods, increase activity.  -     Lipid panel  Morbid Obesity with co morbidities - long discussion about weight loss, diet, and exercise  CKD (chronic kidney disease) stage 2, GFR 60-89 ml/min Increase fluids, avoid NSAIDS, monitor sugars, will monitor -     BASIC METABOLIC PANEL WITH GFR  Medication management -     Magnesium  Neuropathy Will see if lyrica is generic, did better with that than gabapentin  Continue diet and meds as discussed. Further disposition pending results of labs. Over 30 minutes of exam, counseling, chart review, and critical decision making was performed  Future Appointments  Date Time Provider [place]  09/13/2019  2:45 PM Marzetta Board, DPM [facility]  10/10/2019  2:00 PM Vicie Mutters, PA-C [facility] None  04/21/2020  2:00 PM McClanahan, Danton Sewer, NP [facility] None    HPI 73 y.o. female  presents for 3 month follow up on hypertension, cholesterol, prediabetes, and vitamin D deficiency.   Her  blood pressure has been controlled at home, her cardizem dose was cut in half due to bradycardia last visit, has been good, today their BP is BP: 120/74  BMI is Body mass index is 36.27 kg/m., she is working on diet and exercise. Wt Readings from Last 3 Encounters:  07/10/19 231 lb 9.6 oz (105.1 kg)  04/02/19 232 lb 12.8 oz (105.6 kg)  12/31/18 239 lb (108.4 kg)    She does workout. She denies chest pain, shortness of breath, dizziness.  She is on cholesterol medication and denies myalgias. Her cholesterol is at goal. The cholesterol last visit was:   Lab Results  Component Value Date   CHOL 156 04/02/2019   HDL 37 (L) 04/02/2019   LDLCALC 82 04/02/2019   TRIG 281 (H) 04/02/2019   CHOLHDL 4.2 04/02/2019   She has been working on diet and exercise for Diabetes with diabetic chronic kidney disease, has been in preDM range x 09/10/2015, she is on bASA, she is on ACE/ARB,  she has idiopathic paraesthsias and is on lyrica/elavil and denies paresthesia of the feet, polydipsia, polyuria and visual disturbances. Last A1C was:  Lab Results  Component Value Date   HGBA1C 5.9 (H) 04/02/2019   Lab Results  Component Value Date   GFRNONAA 55 (L) 04/02/2019   Patient is on Vitamin D supplement.   Lab Results  Component Value Date   VD25OH 46 04/02/2019     She is on thyroid medication. Her medication was not changed last visit, 1.5 x 4 a day and 1 x 3 days a wee.   Lab Results  Component  Value Date   TSH 0.78 04/02/2019  .   Current Medications:  Current Outpatient Medications on File Prior to Visit  Medication Sig Dispense Refill  . Acetaminophen (TYLENOL ARTHRITIS PAIN PO) Take by mouth.    Marland Kitchen aspirin 81 MG tablet Take 81 mg by mouth daily.    Marland Kitchen atorvastatin (LIPITOR) 40 MG tablet Take 1 tablet Daily  for Cholesterol 90 tablet 3  . b complex vitamins tablet Take 1 tablet by mouth daily.    Marland Kitchen dicyclomine (BENTYL) 20 MG tablet Take 1 tablet (20 mg total) by mouth 3 (three) times daily  as needed for spasms. 270 tablet 0  . diltiazem (DILT-XR) 120 MG 24 hr capsule Take 1 capsule (120 mg total) by mouth daily. 90 capsule 1  . dorzolamide-timolol (COSOPT) 22.3-6.8 MG/ML ophthalmic solution USE 1 DROP INTO THE AFFECTED EYE 2 TIMES DAILY  1  . furosemide (LASIX) 20 MG tablet Take 1 tablet 2 x /day for BP & Fluid Retention 180 tablet 3  . gabapentin (NEURONTIN) 800 MG tablet Take 1 tablet (800 mg total) by mouth 3 (three) times daily. 90 tablet 3  . levothyroxine (SYNTHROID) 75 MCG tablet Take 1 to 1.5 tablet daily as directed on an empty stomach with only water for 30 minutes & No Antacid meds, Calcium or Magnesium for 4 hrs 114 tablet 3  . meloxicam (MOBIC) 7.5 MG tablet TAKE 1 TABLET DAILY WITH   FOOD FOR PAIN CAN TAKE A SECOND PILL LATER IN THE DAY IF YOU NEED IT 180 tablet 1  . Multiple Vitamin (MULTIVITAMIN) tablet Take 1 tablet by mouth daily.    . ondansetron (ZOFRAN) 4 MG tablet Take 1 tablet (4 mg total) by mouth daily as needed for nausea or vomiting. 30 tablet 1  . pantoprazole (PROTONIX) 20 MG tablet Take 1 tablet (20 mg total) by mouth daily. 90 tablet 1   No current facility-administered medications on file prior to visit.    Medical History:  Past Medical History:  Diagnosis Date  . Anemia   . Chest tightness   . Fatigue   . GERD (gastroesophageal reflux disease)   . Hyperlipidemia   . Hypothyroid   . Internal hemorrhoids 05/04/2009   Qualifier: Diagnosis of  By: Nelson-Smith CMA (AAMA), Dottie    . Myalgia   . Obesity   . Prediabetes   . Ulcer   . Varicose veins    Allergies Allergies  Allergen Reactions  . Ciprofloxacin   . Macrobid [Nitrofurantoin Macrocrystal]   . Penicillins     REACTION: rash  . Procaine Hcl     REACTION: difficulty breathing  . Sulfonamide Derivatives     REACTION: hives  . Tetanus Toxoid     REACTION: arm swells   Surgical History: reviewed and unchanged Family History: reviewed and unchanged Social History: reviewed  and unchanged   Review of Systems:  Review of Systems  Constitutional: Negative.   HENT: Negative.   Eyes: Negative.   Respiratory: Negative.   Cardiovascular: Negative.   Gastrointestinal: Negative.   Genitourinary: Negative.   Musculoskeletal: Negative.   Skin: Negative.   Neurological: Negative.   Endo/Heme/Allergies: Negative.   Psychiatric/Behavioral: Negative.     Physical Exam: BP 120/74   Pulse 61   Temp (!) 97.1 F (36.2 C)   Ht 5\' 7"  (1.702 m)   Wt 231 lb 9.6 oz (105.1 kg)   SpO2 97%   BMI 36.27 kg/m  Wt Readings from Last 3 Encounters:  07/10/19  231 lb 9.6 oz (105.1 kg)  04/02/19 232 lb 12.8 oz (105.6 kg)  12/31/18 239 lb (108.4 kg)   General Appearance: Well nourished, in no apparent distress. Eyes: PERRLA, EOMs, conjunctiva no swelling or erythema Sinuses: No Frontal/maxillary tenderness ENT/Mouth: Ext aud canals clear, TMs without erythema, bulging. No erythema, swelling, or exudate on post pharynx.  Tonsils not swollen or erythematous. Hearing normal.  Neck: Supple, thyroid normal.  Respiratory: Respiratory effort normal, BS equal bilaterally without rales, rhonchi, wheezing or stridor.  Cardio: RRR with no MRGs. Brisk peripheral pulses with mild edema.  Abdomen: Soft, + BS,  Non tender, no guarding, rebound, hernias, masses. Lymphatics: Non tender without lymphadenopathy.  Musculoskeletal: Full ROM, 5/5 strength, Normal gait Skin:  Warm, dry without rashes, lesions, ecchymosis.  Neuro: Cranial nerves intact. Normal muscle tone, no cerebellar symptoms. Psych: Awake and oriented X 3, normal affect, Insight and Judgment appropriate.   Vicie Mutters, PA-C 4:03 PM [facility]

## 2019-07-09 ENCOUNTER — Ambulatory Visit: Payer: PPO | Admitting: Podiatry

## 2019-07-09 ENCOUNTER — Encounter: Payer: Self-pay | Admitting: Podiatry

## 2019-07-09 ENCOUNTER — Other Ambulatory Visit: Payer: Self-pay

## 2019-07-09 VITALS — Temp 98.0°F

## 2019-07-09 DIAGNOSIS — M79674 Pain in right toe(s): Secondary | ICD-10-CM

## 2019-07-09 DIAGNOSIS — B351 Tinea unguium: Secondary | ICD-10-CM

## 2019-07-09 DIAGNOSIS — M79675 Pain in left toe(s): Secondary | ICD-10-CM | POA: Diagnosis not present

## 2019-07-09 DIAGNOSIS — G629 Polyneuropathy, unspecified: Secondary | ICD-10-CM

## 2019-07-09 DIAGNOSIS — E118 Type 2 diabetes mellitus with unspecified complications: Secondary | ICD-10-CM

## 2019-07-09 DIAGNOSIS — L84 Corns and callosities: Secondary | ICD-10-CM

## 2019-07-09 NOTE — Patient Instructions (Signed)
Corns and Calluses Corns are small areas of thickened skin that occur on the top, sides, or tip of a toe. They contain a cone-shaped core with a point that can press on a nerve below. This causes pain.  Calluses are areas of thickened skin that can occur anywhere on the body, including the hands, fingers, palms, soles of the feet, and heels. Calluses are usually larger than corns. What are the causes? Corns and calluses are caused by rubbing (friction) or pressure, such as from shoes that are too tight or do not fit properly. What increases the risk? Corns are more likely to develop in people who have misshapen toes (toe deformities), such as hammer toes. Calluses can occur with friction to any area of the skin. They are more likely to develop in people who:  Work with their hands.  Wear shoes that fit poorly, are too tight, or are high-heeled.  Have toe deformities. What are the signs or symptoms? Symptoms of a corn or callus include:  A hard growth on the skin.  Pain or tenderness under the skin.  Redness and swelling.  Increased discomfort while wearing tight-fitting shoes, if your feet are affected. If a corn or callus becomes infected, symptoms may include:  Redness and swelling that gets worse.  Pain.  Fluid, blood, or pus draining from the corn or callus. How is this diagnosed? Corns and calluses may be diagnosed based on your symptoms, your medical history, and a physical exam. How is this treated? Treatment for corns and calluses may include:  Removing the cause of the friction or pressure. This may involve: ? Changing your shoes. ? Wearing shoe inserts (orthotics) or other protective layers in your shoes, such as a corn pad. ? Wearing gloves.  Applying medicine to the skin (topical medicine) to help soften skin in the hardened, thickened areas.  Removing layers of dead skin with a file to reduce the size of the corn or callus.  Removing the corn or callus with a  scalpel or laser.  Taking antibiotic medicines, if your corn or callus is infected.  Having surgery, if a toe deformity is the cause. Follow these instructions at home:   Take over-the-counter and prescription medicines only as told by your health care provider.  If you were prescribed an antibiotic, take it as told by your health care provider. Do not stop taking it even if your condition starts to improve.  Wear shoes that fit well. Avoid wearing high-heeled shoes and shoes that are too tight or too loose.  Wear any padding, protective layers, gloves, or orthotics as told by your health care provider.  Soak your hands or feet and then use a file or pumice stone to soften your corn or callus. Do this as told by your health care provider.  Check your corn or callus every day for symptoms of infection. Contact a health care provider if you:  Notice that your symptoms do not improve with treatment.  Have redness or swelling that gets worse.  Notice that your corn or callus becomes painful.  Have fluid, blood, or pus coming from your corn or callus.  Have new symptoms. Summary  Corns are small areas of thickened skin that occur on the top, sides, or tip of a toe.  Calluses are areas of thickened skin that can occur anywhere on the body, including the hands, fingers, palms, and soles of the feet. Calluses are usually larger than corns.  Corns and calluses are caused by   rubbing (friction) or pressure, such as from shoes that are too tight or do not fit properly.  Treatment may include wearing any padding, protective layers, gloves, or orthotics as told by your health care provider. This information is not intended to replace advice given to you by your health care provider. Make sure you discuss any questions you have with your health care provider. Document Released: 08/13/2004 Document Revised: 02/27/2019 Document Reviewed: 09/20/2017 Elsevier Patient Education  2020 [employer]  Inc.   Onychomycosis/Fungal Toenails  WHAT IS IT? An infection that lies within the keratin of your nail plate that is caused by a fungus.  WHY ME? Fungal infections affect all ages, sexes, races, and creeds.  There may be many factors that predispose you to a fungal infection such as age, coexisting medical conditions such as diabetes, or an autoimmune disease; stress, medications, fatigue, genetics, etc.  Bottom line: fungus thrives in a warm, moist environment and your shoes offer such a location.  IS IT CONTAGIOUS? Theoretically, yes.  You do not want to share shoes, nail clippers or files with someone who has fungal toenails.  Walking around barefoot in the same room or sleeping in the same bed is unlikely to transfer the organism.  It is important to realize, however, that fungus can spread easily from one nail to the next on the same foot.  HOW DO WE TREAT THIS?  There are several ways to treat this condition.  Treatment may depend on many factors such as age, medications, pregnancy, liver and kidney conditions, etc.  It is best to ask your doctor which options are available to you.  1. No treatment.   Unlike many other medical concerns, you can live with this condition.  However for many people this can be a painful condition and may lead to ingrown toenails or a bacterial infection.  It is recommended that you keep the nails cut short to help reduce the amount of fungal nail. 2. Topical treatment.  These range from herbal remedies to prescription strength nail lacquers.  About 40-50% effective, topicals require twice daily application for approximately 9 to 12 months or until an entirely new nail has grown out.  The most effective topicals are medical grade medications available through physicians offices. 3. Oral antifungal medications.  With an 80-90% cure rate, the most common oral medication requires 3 to 4 months of therapy and stays in your system for a year as the new nail grows out.   Oral antifungal medications do require blood work to make sure it is a safe drug for you.  A liver function panel will be performed prior to starting the medication and after the first month of treatment.  It is important to have the blood work performed to avoid any harmful side effects.  In general, this medication safe but blood work is required. 4. Laser Therapy.  This treatment is performed by applying a specialized laser to the affected nail plate.  This therapy is noninvasive, fast, and non-painful.  It is not covered by insurance and is therefore, out of pocket.  The results have been very good with a 80-95% cure rate.  [facility] is the only practice in the area to offer this therapy. 5. Permanent Nail Avulsion.  Removing the entire nail so that a new nail will not grow back. 

## 2019-07-10 ENCOUNTER — Encounter: Payer: Self-pay | Admitting: Physician Assistant

## 2019-07-10 ENCOUNTER — Ambulatory Visit (INDEPENDENT_AMBULATORY_CARE_PROVIDER_SITE_OTHER): Payer: PPO | Admitting: Physician Assistant

## 2019-07-10 ENCOUNTER — Other Ambulatory Visit: Payer: Self-pay

## 2019-07-10 VITALS — BP 120/74 | HR 61 | Temp 97.1°F | Ht 67.0 in | Wt 231.6 lb

## 2019-07-10 DIAGNOSIS — Z6837 Body mass index (BMI) 37.0-37.9, adult: Secondary | ICD-10-CM

## 2019-07-10 DIAGNOSIS — E039 Hypothyroidism, unspecified: Secondary | ICD-10-CM | POA: Diagnosis not present

## 2019-07-10 DIAGNOSIS — R0989 Other specified symptoms and signs involving the circulatory and respiratory systems: Secondary | ICD-10-CM

## 2019-07-10 DIAGNOSIS — Z79899 Other long term (current) drug therapy: Secondary | ICD-10-CM

## 2019-07-10 DIAGNOSIS — I700 Atherosclerosis of aorta: Secondary | ICD-10-CM | POA: Diagnosis not present

## 2019-07-10 DIAGNOSIS — E66812 Obesity, class 2: Secondary | ICD-10-CM

## 2019-07-10 DIAGNOSIS — E559 Vitamin D deficiency, unspecified: Secondary | ICD-10-CM | POA: Diagnosis not present

## 2019-07-10 DIAGNOSIS — E782 Mixed hyperlipidemia: Secondary | ICD-10-CM | POA: Diagnosis not present

## 2019-07-10 DIAGNOSIS — Z85828 Personal history of other malignant neoplasm of skin: Secondary | ICD-10-CM

## 2019-07-10 DIAGNOSIS — E118 Type 2 diabetes mellitus with unspecified complications: Secondary | ICD-10-CM | POA: Diagnosis not present

## 2019-07-10 MED ORDER — PREGABALIN 100 MG PO CAPS: 100.0000 mg | ORAL_CAPSULE | Freq: Three times a day (TID) | ORAL | 2 refills | Status: DC

## 2019-07-10 MED ORDER — DICLOFENAC SODIUM 1 % TD GEL: 4.0000 g | Freq: Four times a day (QID) | TRANSDERMAL | 3 refills | Status: DC

## 2019-07-10 MED ORDER — FAMOTIDINE 40 MG PO TABS: 40.0000 mg | ORAL_TABLET | Freq: Every evening | ORAL | 1 refills | Status: DC

## 2019-07-10 NOTE — Patient Instructions (Addendum)
Get the high dose flu   See if lyrica is generic, if this helps stop the gabapentin  Can continue CBD cream Try the voltern gel  Aleve, ibuprofen is an antiinflammatory  Try not to take it daily, take AS needed this can cause inflammation in your stomach and can cause ulcers or bleeding, this will look like black tarry stools Make sure you taking it with food   You can take tylenol (500mg ) or tylenol arthritis (650mg ) with the antiinflammatories. The max you can take of tylenol a day is 3000mg  daily, this is a max of 6 pills a day of the regular tyelnol (500mg ) or a max of 4 a day of the tylenol arthritis (650mg ) as long as no other medications you are taking contain tylenol.    WATER IS IMPORTANT  Being dehydrated can hurt your kidneys, cause fatigue, headaches, muscle aches, joint pain, and dry skin/nails so please increase your fluids.   Drink 80-100 oz a day of water, measure it out! Eat 3 meals a day, have to do breakfast, eat protein- hard boiled eggs, protein bar like nature [place] protein bar, greek yogurt like oikos triple zero, chobani 100, or light n fit greek  Can check out plantnanny app on your phone to help you keep track of your water     When it comes to diets, agreement about the perfect plan isn't easy to find, even among the experts. Experts at the [employer] developed an idea known as the Healthy Eating Plate. Just imagine a plate divided into logical, healthy portions.  The emphasis is on diet quality:  Load up on vegetables and fruits - one-half of your plate: Aim for color and variety, and remember that potatoes don't count.  Go for whole grains - one-quarter of your plate: Whole wheat, barley, wheat berries, quinoa, oats, brown rice, and foods made with them. If you want pasta, go with whole wheat pasta.  Protein power - one-quarter of your plate: Fish, chicken, beans, and nuts are all healthy, versatile protein sources. Limit red  meat.  The diet, however, does go beyond the plate, offering a few other suggestions.  Use healthy plant oils, such as olive, canola, soy, corn, sunflower and peanut. Check the labels, and avoid partially hydrogenated oil, which have unhealthy trans fats.  If you're thirsty, drink water. Coffee and tea are good in moderation, but skip sugary drinks and limit milk and dairy products to one or two daily servings.  The type of carbohydrate in the diet is more important than the amount. Some sources of carbohydrates, such as vegetables, fruits, whole grains, and beans-are healthier than others.  Finally, stay active.

## 2019-07-11 LAB — CBC WITH DIFFERENTIAL/PLATELET
Absolute Monocytes: 624 cells/uL (ref 200–950)
Basophils Absolute: 58 cells/uL (ref 0–200)
Basophils Relative: 0.6 %
Eosinophils Absolute: 125 cells/uL (ref 15–500)
Eosinophils Relative: 1.3 %
HCT: 41.1 % (ref 35.0–45.0)
Hemoglobin: 13.4 g/dL (ref 11.7–15.5)
Lymphs Abs: 3216 cells/uL (ref 850–3900)
MCH: 27.7 pg (ref 27.0–33.0)
MCHC: 32.6 g/dL (ref 32.0–36.0)
MCV: 84.9 fL (ref 80.0–100.0)
MPV: 10.9 fL (ref 7.5–12.5)
Monocytes Relative: 6.5 %
Neutro Abs: 5578 cells/uL (ref 1500–7800)
Neutrophils Relative %: 58.1 %
Platelets: 290 10*3/uL (ref 140–400)
RBC: 4.84 10*6/uL (ref 3.80–5.10)
RDW: 13.8 % (ref 11.0–15.0)
Total Lymphocyte: 33.5 %
WBC: 9.6 10*3/uL (ref 3.8–10.8)

## 2019-07-11 LAB — COMPLETE METABOLIC PANEL WITH GFR
AG Ratio: 1.6 (calc) (ref 1.0–2.5)
ALT: 17 U/L (ref 6–29)
AST: 18 U/L (ref 10–35)
Albumin: 4.1 g/dL (ref 3.6–5.1)
Alkaline phosphatase (APISO): 99 U/L (ref 37–153)
BUN/Creatinine Ratio: 25 (calc) — ABNORMAL HIGH (ref 6–22)
BUN: 28 mg/dL — ABNORMAL HIGH (ref 7–25)
CO2: 25 mmol/L (ref 20–32)
Calcium: 9.3 mg/dL (ref 8.6–10.4)
Chloride: 107 mmol/L (ref 98–110)
Creat: 1.11 mg/dL — ABNORMAL HIGH (ref 0.60–0.93)
GFR, Est African American: 58 mL/min/{1.73_m2} — ABNORMAL LOW (ref >=60)
GFR, Est Non African American: 50 mL/min/{1.73_m2} — ABNORMAL LOW (ref >=60)
Globulin: 2.5 g/dL (calc) (ref 1.9–3.7)
Glucose, Bld: 86 mg/dL (ref 65–99)
Potassium: 4.1 mmol/L (ref 3.5–5.3)
Sodium: 143 mmol/L (ref 135–146)
Total Bilirubin: 0.6 mg/dL (ref 0.2–1.2)
Total Protein: 6.6 g/dL (ref 6.1–8.1)

## 2019-07-11 LAB — LIPID PANEL
Cholesterol: 154 mg/dL (ref <200)
HDL: 40 mg/dL — ABNORMAL LOW (ref >=50)
LDL Cholesterol (Calc): 86 mg/dL (calc)
Non-HDL Cholesterol (Calc): 114 mg/dL (calc) (ref <130)
Total CHOL/HDL Ratio: 3.9 (calc) (ref <5.0)
Triglycerides: 185 mg/dL — ABNORMAL HIGH (ref <150)

## 2019-07-11 LAB — TSH: TSH: 1.92 mIU/L (ref 0.40–4.50)

## 2019-07-11 LAB — HEMOGLOBIN A1C
Hgb A1c MFr Bld: 6 % of total Hgb — ABNORMAL HIGH (ref <5.7)
Mean Plasma Glucose: 126 (calc)
eAG (mmol/L): 7 (calc)

## 2019-07-11 LAB — MAGNESIUM: Magnesium: 2.3 mg/dL (ref 1.5–2.5)

## 2019-07-18 NOTE — Progress Notes (Signed)
Subjective: [name] is a 73 y.o. y.o. female who is diabetic and presents today with cc of painful, discolored, thick toenails and corns and calluses which interfere with daily activities. Pain is aggravated when wearing enclosed shoe gear and relieved with periodic professional debridement.  Unk Pinto, MD is her PCP.   Current Outpatient Medications:  .  Acetaminophen (TYLENOL ARTHRITIS PAIN PO), Take by mouth., Disp: , Rfl:  .  aspirin 81 MG tablet, Take 81 mg by mouth daily., Disp: , Rfl:  .  atorvastatin (LIPITOR) 40 MG tablet, Take 1 tablet Daily  for Cholesterol, Disp: 90 tablet, Rfl: 3 .  b complex vitamins tablet, Take 1 tablet by mouth daily., Disp: , Rfl:  .  diclofenac sodium (VOLTAREN) 1 % GEL, Apply 4 g topically 4 (four) times daily., Disp: 100 g, Rfl: 3 .  dicyclomine (BENTYL) 20 MG tablet, Take 1 tablet (20 mg total) by mouth 3 (three) times daily as needed for spasms., Disp: 270 tablet, Rfl: 0 .  diltiazem (DILT-XR) 120 MG 24 hr capsule, Take 1 capsule (120 mg total) by mouth daily., Disp: 90 capsule, Rfl: 1 .  dorzolamide-timolol (COSOPT) 22.3-6.8 MG/ML ophthalmic solution, USE 1 DROP INTO THE AFFECTED EYE 2 TIMES DAILY, Disp: , Rfl: 1 .  famotidine (PEPCID) 40 MG tablet, Take 1 tablet (40 mg total) by mouth every evening., Disp: 90 tablet, Rfl: 1 .  furosemide (LASIX) 20 MG tablet, Take 1 tablet 2 x /day for BP & Fluid Retention, Disp: 180 tablet, Rfl: 3 .  gabapentin (NEURONTIN) 800 MG tablet, Take 1 tablet (800 mg total) by mouth 3 (three) times daily., Disp: 90 tablet, Rfl: 3 .  levothyroxine (SYNTHROID) 75 MCG tablet, Take 1 to 1.5 tablet daily as directed on an empty stomach with only water for 30 minutes & No Antacid meds, Calcium or Magnesium for 4 hrs, Disp: 114 tablet, Rfl: 3 .  meloxicam (MOBIC) 7.5 MG tablet, TAKE 1 TABLET DAILY WITH   FOOD FOR PAIN CAN TAKE A SECOND PILL LATER IN THE DAY IF YOU NEED IT, Disp: 180 tablet, Rfl: 1 .  Multiple Vitamin  (MULTIVITAMIN) tablet, Take 1 tablet by mouth daily., Disp: , Rfl:  .  ondansetron (ZOFRAN) 4 MG tablet, Take 1 tablet (4 mg total) by mouth daily as needed for nausea or vomiting., Disp: 30 tablet, Rfl: 1 .  pantoprazole (PROTONIX) 20 MG tablet, Take 1 tablet (20 mg total) by mouth daily., Disp: 90 tablet, Rfl: 1 .  pregabalin (LYRICA) 100 MG capsule, Take 1 capsule (100 mg total) by mouth 3 (three) times daily., Disp: 90 capsule, Rfl: 2  Allergies  Allergen Reactions  . Ciprofloxacin   . Macrobid [Nitrofurantoin Macrocrystal]   . Penicillins     REACTION: rash  . Procaine Hcl     REACTION: difficulty breathing  . Sulfonamide Derivatives     REACTION: hives  . Tetanus Toxoid     REACTION: arm swells    Objective: Vitals:   07/09/19 1606  Temp: 98 F (36.7 C)    Vascular Examination: Capillary refill time less than 3 seconds x 10 digits.  Dorsalis pedis and posterior tibial pulses remain palpable bilaterally.  There is no digital hair present bilaterally.  Skin temperature gradient remains within normal limits bilaterally.  Dermatological Examination: Skin with normal turgor, texture and tone b/l.  Toenails 1 through 5 bilaterally are elongated, discolored, thickened dystrophic with subungual debris.  There is tenderness with dorsal palpation.  There are no  open wounds noted bilaterally.  No interdigital macerations noted bilaterally.  Hyperkeratotic lesion submetatarsal head 5 left, distal tip bilateral hallux, distal tip bilateral second digits and distal tip right third digit.  There is no erythema, no edema, no drainage, no flocculence noted.  Musculoskeletal: Muscle strength 5/5 to all LE muscle groups.  Contracted digits 1 through 4 bilaterally.  Neurological: Sensation absent bilaterally when tested with 10 g monofilament.  Assessment: 1. Painful onychomycosis toenails 1-5 b/l 2.  Callus bilateral hallux and submetatarsal head 5 left foot 3.  Corn bilateral second  digits and right third digit 4.  NIDDM 5.  Peripheral neuropathy  Plan: 1. Continue diabetic foot care principles. Literature dispensed on today. 2. Toenails 1-5 b/l were debrided in length and girth without iatrogenic bleeding. 3. Hyperkeratotic lesion(s) submetatarsal head 5 left, distal tip bilateral hallux, distal tip bilateral second digits and distal tip right third digit. pared with sterile scalpel blade without incident. 4. Patient to continue soft, supportive shoe gear daily. 5. Patient to report any pedal injuries to medical professional immediately. 6. Follow up 9 weeks.  7. Patient/POA to call should there be a concern in the interim.

## 2019-07-23 DIAGNOSIS — Z85828 Personal history of other malignant neoplasm of skin: Secondary | ICD-10-CM | POA: Diagnosis not present

## 2019-07-23 DIAGNOSIS — L439 Lichen planus, unspecified: Secondary | ICD-10-CM | POA: Diagnosis not present

## 2019-07-23 DIAGNOSIS — D485 Neoplasm of uncertain behavior of skin: Secondary | ICD-10-CM | POA: Diagnosis not present

## 2019-07-23 DIAGNOSIS — D1801 Hemangioma of skin and subcutaneous tissue: Secondary | ICD-10-CM | POA: Diagnosis not present

## 2019-07-23 DIAGNOSIS — L814 Other melanin hyperpigmentation: Secondary | ICD-10-CM | POA: Diagnosis not present

## 2019-07-23 DIAGNOSIS — L821 Other seborrheic keratosis: Secondary | ICD-10-CM | POA: Diagnosis not present

## 2019-07-23 DIAGNOSIS — D225 Melanocytic nevi of trunk: Secondary | ICD-10-CM | POA: Diagnosis not present

## 2019-07-25 ENCOUNTER — Other Ambulatory Visit: Payer: Self-pay

## 2019-07-25 MED ORDER — FAMOTIDINE 40 MG PO TABS: 40.0000 mg | ORAL_TABLET | Freq: Every evening | ORAL | 1 refills | Status: DC

## 2019-07-25 MED ORDER — DICLOFENAC SODIUM 1 % TD GEL: 4.0000 g | Freq: Four times a day (QID) | TRANSDERMAL | 3 refills | Status: DC

## 2019-07-25 NOTE — Progress Notes (Signed)
Pharmacy never received prescriptions for Famotidine and Diclofenac Sodium Gel

## 2019-07-30 ENCOUNTER — Other Ambulatory Visit: Payer: Self-pay | Admitting: *Deleted

## 2019-07-30 MED ORDER — FAMOTIDINE 40 MG PO TABS: 40.0000 mg | ORAL_TABLET | Freq: Every evening | ORAL | 1 refills | Status: DC

## 2019-08-01 ENCOUNTER — Other Ambulatory Visit: Payer: Self-pay | Admitting: Physician Assistant

## 2019-08-01 MED ORDER — PREGABALIN 100 MG PO CAPS: 100.0000 mg | ORAL_CAPSULE | Freq: Three times a day (TID) | ORAL | 2 refills | Status: DC

## 2019-08-27 ENCOUNTER — Ambulatory Visit (INDEPENDENT_AMBULATORY_CARE_PROVIDER_SITE_OTHER): Payer: PPO

## 2019-08-27 ENCOUNTER — Other Ambulatory Visit: Payer: Self-pay

## 2019-08-27 VITALS — Temp 97.5°F

## 2019-08-27 DIAGNOSIS — Z23 Encounter for immunization: Secondary | ICD-10-CM | POA: Diagnosis not present

## 2019-08-27 NOTE — Progress Notes (Signed)
Patient presents to the office forHDFlu Vaccine. Vaccine administered toLEFTDeltoid withoutanycomplication. Temperature taken and recorded 

## 2019-09-04 DIAGNOSIS — H40013 Open angle with borderline findings, low risk, bilateral: Secondary | ICD-10-CM | POA: Diagnosis not present

## 2019-09-13 ENCOUNTER — Other Ambulatory Visit: Payer: Self-pay

## 2019-09-13 ENCOUNTER — Ambulatory Visit: Payer: PPO | Admitting: Podiatry

## 2019-09-13 DIAGNOSIS — E1142 Type 2 diabetes mellitus with diabetic polyneuropathy: Secondary | ICD-10-CM | POA: Diagnosis not present

## 2019-09-13 DIAGNOSIS — M79674 Pain in right toe(s): Secondary | ICD-10-CM

## 2019-09-13 DIAGNOSIS — B351 Tinea unguium: Secondary | ICD-10-CM

## 2019-09-13 DIAGNOSIS — L84 Corns and callosities: Secondary | ICD-10-CM

## 2019-09-13 DIAGNOSIS — M79675 Pain in left toe(s): Secondary | ICD-10-CM | POA: Diagnosis not present

## 2019-09-13 NOTE — Patient Instructions (Signed)
Diabetes Mellitus and Foot Care Foot care is an important part of your health, especially when you have diabetes. Diabetes may cause you to have problems because of poor blood flow (circulation) to your feet and legs, which can cause your skin to:  Become thinner and drier.  Break more easily.  Heal more slowly.  Peel and crack. You may also have nerve damage (neuropathy) in your legs and feet, causing decreased feeling in them. This means that you may not notice minor injuries to your feet that could lead to more serious problems. Noticing and addressing any potential problems early is the best way to prevent future foot problems. How to care for your feet Foot hygiene  Wash your feet daily with warm water and mild soap. Do not use hot water. Then, pat your feet and the areas between your toes until they are completely dry. Do not soak your feet as this can dry your skin.  Trim your toenails straight across. Do not dig under them or around the cuticle. File the edges of your nails with an emery board or nail file.  Apply a moisturizing lotion or petroleum jelly to the skin on your feet and to dry, brittle toenails. Use lotion that does not contain alcohol and is unscented. Do not apply lotion between your toes. Shoes and socks  Wear clean socks or stockings every day. Make sure they are not too tight. Do not wear knee-high stockings since they may decrease blood flow to your legs.  Wear shoes that fit properly and have enough cushioning. Always look in your shoes before you put them on to be sure there are no objects inside.  To break in new shoes, wear them for just a few hours a day. This prevents injuries on your feet. Wounds, scrapes, corns, and calluses  Check your feet daily for blisters, cuts, bruises, sores, and redness. If you cannot see the bottom of your feet, use a mirror or ask someone for help.  Do not cut corns or calluses or try to remove them with medicine.  If you  find a minor scrape, cut, or break in the skin on your feet, keep it and the skin around it clean and dry. You may clean these areas with mild soap and water. Do not clean the area with peroxide, alcohol, or iodine.  If you have a wound, scrape, corn, or callus on your foot, look at it several times a day to make sure it is healing and not infected. Check for: ? Redness, swelling, or pain. ? Fluid or blood. ? Warmth. ? Pus or a bad smell. General instructions  Do not cross your legs. This may decrease blood flow to your feet.  Do not use heating pads or hot water bottles on your feet. They may burn your skin. If you have lost feeling in your feet or legs, you may not know this is happening until it is too late.  Protect your feet from hot and cold by wearing shoes, such as at the beach or on hot pavement.  Schedule a complete foot exam at least once a year (annually) or more often if you have foot problems. If you have foot problems, report any cuts, sores, or bruises to your health care provider immediately. Contact a health care provider if:  You have a medical condition that increases your risk of infection and you have any cuts, sores, or bruises on your feet.  You have an injury that is not   healing.  You have redness on your legs or feet.  You feel burning or tingling in your legs or feet.  You have pain or cramps in your legs and feet.  Your legs or feet are numb.  Your feet always feel cold.  You have pain around a toenail. Get help right away if:  You have a wound, scrape, corn, or callus on your foot and: ? You have pain, swelling, or redness that gets worse. ? You have fluid or blood coming from the wound, scrape, corn, or callus. ? Your wound, scrape, corn, or callus feels warm to the touch. ? You have pus or a bad smell coming from the wound, scrape, corn, or callus. ? You have a fever. ? You have a red line going up your leg. Summary  Check your feet every day  for cuts, sores, red spots, swelling, and blisters.  Moisturize feet and legs daily.  Wear shoes that fit properly and have enough cushioning.  If you have foot problems, report any cuts, sores, or bruises to your health care provider immediately.  Schedule a complete foot exam at least once a year (annually) or more often if you have foot problems. This information is not intended to replace advice given to you by your health care provider. Make sure you discuss any questions you have with your health care provider. Document Released: 11/04/2000 Document Revised: 12/20/2017 Document Reviewed: 12/09/2016 Elsevier Patient Education  2020 [employer]  

## 2019-09-16 ENCOUNTER — Encounter: Payer: Self-pay | Admitting: Podiatry

## 2019-09-16 NOTE — Progress Notes (Signed)
Subjective: [name] is seen today for follow up painful, elongated, thickened toenails 1-5 b/l feet that she cannot cut. Pain interferes with daily activities. Aggravating factor includes wearing enclosed shoe gear and relieved with periodic debridement.  Current Outpatient Medications on File Prior to Visit  Medication Sig  . Acetaminophen (TYLENOL ARTHRITIS PAIN PO) Take by mouth.  Marland Kitchen aspirin 81 MG tablet Take 81 mg by mouth daily.  Marland Kitchen atorvastatin (LIPITOR) 40 MG tablet Take 1 tablet Daily  for Cholesterol  . b complex vitamins tablet Take 1 tablet by mouth daily.  . diclofenac sodium (VOLTAREN) 1 % GEL Apply 4 g topically 4 (four) times daily.  Marland Kitchen dicyclomine (BENTYL) 20 MG tablet Take 1 tablet (20 mg total) by mouth 3 (three) times daily as needed for spasms.  Marland Kitchen diltiazem (DILT-XR) 120 MG 24 hr capsule Take 1 capsule (120 mg total) by mouth daily.  . dorzolamide-timolol (COSOPT) 22.3-6.8 MG/ML ophthalmic solution USE 1 DROP INTO THE AFFECTED EYE 2 TIMES DAILY  . famotidine (PEPCID) 40 MG tablet Take 1 tablet (40 mg total) by mouth every evening.  . furosemide (LASIX) 20 MG tablet Take 1 tablet 2 x /day for BP & Fluid Retention  . levothyroxine (SYNTHROID) 75 MCG tablet Take 1 to 1.5 tablet daily as directed on an empty stomach with only water for 30 minutes & No Antacid meds, Calcium or Magnesium for 4 hrs  . meloxicam (MOBIC) 7.5 MG tablet TAKE 1 TABLET DAILY WITH   FOOD FOR PAIN CAN TAKE A SECOND PILL LATER IN THE DAY IF YOU NEED IT  . Multiple Vitamin (MULTIVITAMIN) tablet Take 1 tablet by mouth daily.  . ondansetron (ZOFRAN) 4 MG tablet Take 1 tablet (4 mg total) by mouth daily as needed for nausea or vomiting.  . pantoprazole (PROTONIX) 20 MG tablet Take 1 tablet (20 mg total) by mouth daily.  . pregabalin (LYRICA) 100 MG capsule Take 1 capsule (100 mg total) by mouth 3 (three) times daily.   No current facility-administered medications on file prior to visit.      Allergies   Allergen Reactions  . Ciprofloxacin   . Macrobid [Nitrofurantoin Macrocrystal]   . Penicillins     REACTION: rash  . Procaine Hcl     REACTION: difficulty breathing  . Sulfonamide Derivatives     REACTION: hives  . Tetanus Toxoid     REACTION: arm swells     Objective:  Vascular Examination: Capillary refill time <3 seconds b/l.  Dorsalis pedis present b/l.  Posterior tibial pulses present b/l.  Digital hair absent b/l.  Skin temperature gradient WNL b/l.   Dermatological Examination: Skin with normal turgor, texture and tone b/l  Toenails 1-5 b/l discolored, thick, dystrophic with subungual debris and pain with palpation to nailbeds due to thickness of nails.  Hyperkeratotic lesion b/l great toes, submet head 5 left foot, b/l 2nd digits and right 3rd digit with tenderness to palpation. No edema, no erythema, no drainage, no flocculence.  Musculoskeletal: Muscle strength 5/5 to all LE muscle groups.  Hammertoes lesser digits b/l.  No pain, crepitus or joint limitation noted with ROM.   Neurological Examination: Protective sensation diminished with 10 gram monofilament bilaterally.  Assessment: Painful onychomycosis toenails 1-5 b/l  Calluses b/l great toes, submet head 5 left foot Corns b/l 2nd digits and right 3rd digit NIDDM with neuropathy  Plan: 1. Toenails 1-5 b/l were debrided in length and girth without iatrogenic bleeding.  2. Calluses pared  b/l great  toes, submet head 5 left foot utilizing sterile scalpel blade without incident. Corn(s) b/l 2nd digits and right 3rd digit pared utilizing sterile scalpel blade without incident. 3. Patient to continue soft, supportive shoe gear. 4. Patient to report any pedal injuries to medical professional immediately. 5. Follow up 9 weeks.  6. Patient/POA to call should there be a concern in the interim.

## 2019-09-30 ENCOUNTER — Other Ambulatory Visit: Payer: Self-pay

## 2019-09-30 DIAGNOSIS — Z20822 Contact with and (suspected) exposure to covid-19: Secondary | ICD-10-CM

## 2019-10-01 LAB — NOVEL CORONAVIRUS, NAA: SARS-CoV-2, NAA: NOT DETECTED

## 2019-10-03 ENCOUNTER — Other Ambulatory Visit: Payer: Self-pay | Admitting: Internal Medicine

## 2019-10-03 MED ORDER — FAMOTIDINE 40 MG PO TABS: ORAL_TABLET | ORAL | 3 refills | Status: DC

## 2019-10-09 DIAGNOSIS — E785 Hyperlipidemia, unspecified: Secondary | ICD-10-CM | POA: Insufficient documentation

## 2019-10-09 DIAGNOSIS — N183 Chronic kidney disease, stage 3 unspecified: Secondary | ICD-10-CM | POA: Insufficient documentation

## 2019-10-09 DIAGNOSIS — E1169 Type 2 diabetes mellitus with other specified complication: Secondary | ICD-10-CM | POA: Insufficient documentation

## 2019-10-09 DIAGNOSIS — E1122 Type 2 diabetes mellitus with diabetic chronic kidney disease: Secondary | ICD-10-CM

## 2019-10-09 DIAGNOSIS — E11621 Type 2 diabetes mellitus with foot ulcer: Secondary | ICD-10-CM | POA: Insufficient documentation

## 2019-10-09 HISTORY — DX: Type 2 diabetes mellitus with diabetic chronic kidney disease: E11.22

## 2019-10-09 NOTE — Progress Notes (Signed)
CPE  Assessment and Plan:  Encounter for general adult medical examination with abnormal findings 1 year  Controlled type 2 diabetes mellitus with complication, without long-term current use of insulin (HCC) -     Hemoglobin A1c ([facility]) Discussed general issues about diabetes pathophysiology and management., Educational material distributed., Suggested low cholesterol diet., Encouraged aerobic exercise., Discussed foot care., Reminded to get yearly retinal exam.  Type 2 diabetes mellitus with hyperlipidemia (HCC) -     Hemoglobin A1c ([facility]) check lipids decrease fatty foods increase activity.   CKD stage 3 secondary to diabetes (HCC) -     Hemoglobin A1c ([facility]) Increase fluids, avoid NSAIDS, monitor sugars, will monitor  Atherosclerosis of aorta (HCC) Control blood pressure, cholesterol, glucose, increase exercise.   Class 2 severe obesity due to excess calories with serious comorbidity and body mass index (BMI) of 37.0 to 37.9 in adult [facility]) - follow up 3 months for progress monitoring - increase veggies, decrease carbs - long discussion about weight loss, diet, and exercise  Labile hypertension -     CBC with Diff -     COMPLETE METABOLIC PANEL WITH GFR -     Urinalysis, Routine w reflex microscopic -     Microalbumin / Creatinine Urine Ratio -     EKG 12-Lead -     DG Chest 2 View; Future - continue medications, DASH diet, exercise and monitor at home. Call if greater than 130/80.   Hypothyroidism, unspecified type -     TSH Hypothyroidism-check TSH level, continue medications the same, reminded to take on an empty stomach 30-14mins before food.   Hyperlipidemia, mixed -     Lipid Profile check lipids decrease fatty foods increase activity.   Medication management -     Magnesium  Vitamin D deficiency -     Vitamin D (25 hydroxy)  Anemia, unspecified type - monitor, continue iron supp with Vitamin C and increase green leafy veggies  History of  tobacco use Monitor, get cxr  Chronic venous insufficiency - weight loss discussed, continue compression stockings and elevation  History of basal cell carcinoma (BCC) Follow up derm  Gastroesophageal reflux disease with esophagitis without hemorrhage Continue PPI/H2 blocker, diet discussed  Diverticulosis of large intestine without hemorrhage  Peripheral autonomic neuropathy of unknown cause  Primary osteoarthritis of knee, unspecified laterality   Continue diet and meds as discussed. Further disposition pending results of labs. Over 30 minutes of exam, counseling, chart review, and critical decision making was performed  Future Appointments  Date Time Provider [place]  11/27/2019  1:15 PM Marzetta Board, DPM [facility]  04/21/2020  2:00 PM Garnet Sierras, NP [facility] None  10/12/2020  2:00 PM Vicie Mutters, PA-C [facility] None    HPI 73 y.o. female  presents for CPE and 3 month follow up on hypertension, cholesterol, prediabetes, and vitamin D deficiency.   She has seen Dr. Milinda Pointer for infection/ulcer of left second toe in the past, checks feet daily.   Has great grand son, gets to help watch him some, grand daughter seperated from husband, he is 6 months. Her granddaughter had COVID, she never got it. Son in law had brain surgery in Jan, he is doing well, going to start new job.   BMI is Body mass index is 36.02 kg/m., she is working on diet and exercise. Wt Readings from Last 3 Encounters:  10/10/19 230 lb (104.3 kg)  07/10/19 231 lb 9.6 oz (105.1 kg)  04/02/19 232 lb 12.8 oz (105.6  kg)   Her blood pressure has been controlled at home, has been good, today their BP is BP: 138/76  She does workout. She denies chest pain, shortness of breath, dizziness.  She has been working on diet and exercise for Diabetes  with diabetic chronic kidney disease she is on ACE/ARB With hyperlipidemia NOT at goal less than 70, she is on lipitor 40 mg  she is  on bASA she has idiopathic paraesthsias and is on lyrica/elavil- follows with Dr. Adah Perl every 90 days  Eye exam Dr. Nicki Reaper denies polydipsia, polyuria and visual disturbances.  Last A1C was:  Lab Results  Component Value Date   HGBA1C 6.0 (H) 07/10/2019   Lab Results  Component Value Date   GFRNONAA 50 (L) 07/10/2019   Lab Results  Component Value Date   CHOL 154 07/10/2019   HDL 40 (L) 07/10/2019   LDLCALC 86 07/10/2019   TRIG 185 (H) 07/10/2019   CHOLHDL 3.9 07/10/2019    Patient is on Vitamin D supplement.   Lab Results  Component Value Date   VD25OH 46 04/02/2019     She is on thyroid medication. Her medication was not changed last visit.   Lab Results  Component Value Date   TSH 1.92 07/10/2019  .   Current Medications:   Current Outpatient Medications (Endocrine & Metabolic):  .  levothyroxine (SYNTHROID) 75 MCG tablet, Take 1 to 1.5 tablet daily as directed on an empty stomach with only water for 30 minutes & No Antacid meds, Calcium or Magnesium for 4 hrs  Current Outpatient Medications (Cardiovascular):  .  atorvastatin (LIPITOR) 40 MG tablet, Take 1 tablet Daily  for Cholesterol .  diltiazem (DILT-XR) 120 MG 24 hr capsule, Take 1 capsule (120 mg total) by mouth daily. .  furosemide (LASIX) 20 MG tablet, Take 1 tablet 2 x /day for BP & Fluid Retention   Current Outpatient Medications (Analgesics):  Marland Kitchen  Acetaminophen (TYLENOL ARTHRITIS PAIN PO), Take by mouth. Marland Kitchen  aspirin 81 MG tablet, Take 81 mg by mouth daily. .  meloxicam (MOBIC) 7.5 MG tablet, TAKE 1 TABLET DAILY WITH   FOOD FOR PAIN CAN TAKE A SECOND PILL LATER IN THE DAY IF YOU NEED IT   Current Outpatient Medications (Other):  .  b complex vitamins tablet, Take 1 tablet by mouth daily. .  diclofenac sodium (VOLTAREN) 1 % GEL, Apply 4 g topically 4 (four) times daily. Marland Kitchen  dicyclomine (BENTYL) 20 MG tablet, Take 1 tablet (20 mg total) by mouth 3 (three) times daily as needed for spasms. .   dorzolamide-timolol (COSOPT) 22.3-6.8 MG/ML ophthalmic solution, USE 1 DROP INTO THE AFFECTED EYE 2 TIMES DAILY .  famotidine (PEPCID) 40 MG tablet, Take 1 tablet at Bedtime for Indigestion & Heartburn .  Multiple Vitamin (MULTIVITAMIN) tablet, Take 1 tablet by mouth daily. .  ondansetron (ZOFRAN) 4 MG tablet, Take 1 tablet (4 mg total) by mouth daily as needed for nausea or vomiting. .  pantoprazole (PROTONIX) 20 MG tablet, Take 1 tablet (20 mg total) by mouth daily. .  pregabalin (LYRICA) 100 MG capsule, Take 1 capsule (100 mg total) by mouth 3 (three) times daily.  Medical History:  Past Medical History:  Diagnosis Date  . Anemia   . Chest tightness   . Fatigue   . GERD (gastroesophageal reflux disease)   . Hyperlipidemia   . Hypothyroid   . Internal hemorrhoids 05/04/2009   Qualifier: Diagnosis of  By: Nelson-Smith CMA (AAMA), Dottie    .  Myalgia   . Obesity   . Prediabetes   . Ulcer   . Varicose veins    Health Maintenance:   Immunization History  Administered Date(s) Administered  . Influenza, High Dose Seasonal PF 08/19/2014, 09/10/2015, 07/26/2016, 08/10/2017, 08/14/2018, 08/27/2019  . Pneumococcal Conjugate-13 05/12/2014  . Pneumococcal Polysaccharide-23 09/05/2007, 06/02/2015   Preventative care: Last colonoscopy: 2011, Due 2021 EGD: n/a CT AB 2011 CXR 2013 Heart cath June 1999, no blockages, coronary spasms Last mammogram: 10/2018, Due for 2020 Last pap smear/pelvic exam: 02/2011- declines another DEXA:07/2012, normal  Prior vaccinations: TD or Tdap: ALLERGY  Influenza: 2020 Pneumococcal: 2016 Prevnar13: 2015  Shingles/Zostavax: N/A  [facility]  Dr. Nicki Reaper for eye care, checking for glaucoma, goes yearly,had OV in [employer] Patient Care Team: Unk Pinto, MD as PCP - General (Internal Medicine) Macarthur Critchley, [place] as Referring Physician (Optometry) Vicie Mutters, PA-C as Referring Physician (Physician Assistant)  Allergies Allergies   Allergen Reactions  . Ciprofloxacin   . Macrobid [Nitrofurantoin Macrocrystal]   . Penicillins     REACTION: rash  . Procaine Hcl     REACTION: difficulty breathing  . Sulfonamide Derivatives     REACTION: hives  . Tetanus Toxoid     REACTION: arm swells    SURGICAL HISTORY She  has a past surgical history that includes Abdominal hysterectomy; Tonsillectomy and adenoidectomy; Cataract extraction; Nasal sinus surgery; Rotator cuff repair (Left, more than 7 years ago); and Cyst removal hand (Left, 07/30/2018). FAMILY HISTORY Her family history includes Cancer in her maternal grandmother and mother; Coronary artery disease (age of onset: 59) in her father; Dementia in her maternal aunt and mother. SOCIAL HISTORY She  reports that she quit smoking about 6 years ago. Her smoking use included cigarettes. She has a 23.00 pack-year smoking history. She has never used smokeless tobacco. She reports previous alcohol use. She reports previous drug use.   Review of Systems:  Review of Systems  Constitutional: Negative.   HENT: Negative.   Eyes: Negative.   Respiratory: Negative.   Cardiovascular: Negative.   Gastrointestinal: Negative.   Genitourinary: Negative.   Musculoskeletal: Negative.   Skin: Negative.   Neurological: Negative.   Endo/Heme/Allergies: Negative.   Psychiatric/Behavioral: Negative.     Physical Exam: BP 138/76   Pulse 68   Temp (!) 97.2 F (36.2 C)   Ht 5\' 7"  (1.702 m)   Wt 230 lb (104.3 kg)   SpO2 98%   BMI 36.02 kg/m  Wt Readings from Last 3 Encounters:  10/10/19 230 lb (104.3 kg)  07/10/19 231 lb 9.6 oz (105.1 kg)  04/02/19 232 lb 12.8 oz (105.6 kg)   General Appearance: Well nourished, in no apparent distress. Eyes: PERRLA, EOMs, conjunctiva no swelling or erythema Sinuses: No Frontal/maxillary tenderness ENT/Mouth: Ext aud canals clear, TMs without erythema, bulging. No erythema, swelling, or exudate on post pharynx.  Tonsils not swollen or  erythematous. Hearing normal.  Neck: Supple, thyroid normal.  Respiratory: Respiratory effort normal, BS equal bilaterally without rales, rhonchi, wheezing or stridor.  Cardio: RRR with no MRGs. Brisk peripheral pulses with mild edema.  Abdomen: Soft, + BS,  Non tender, no guarding, rebound, hernias, masses. Lymphatics: Non tender without lymphadenopathy.  Musculoskeletal: Full ROM, 5/5 strength, antalgic gait, left foot in a boot.  Skin:  Warm, dry without rashes, lesions, ecchymosis.  Neuro: Cranial nerves intact. Normal muscle tone, no cerebellar symptoms. Psych: Awake and oriented X 3, normal affect, Insight and Judgment appropriate.  Vicie Mutters, PA-C 2:31 PM [facility]

## 2019-10-10 ENCOUNTER — Other Ambulatory Visit: Payer: Self-pay

## 2019-10-10 ENCOUNTER — Other Ambulatory Visit: Payer: Self-pay | Admitting: *Deleted

## 2019-10-10 ENCOUNTER — Ambulatory Visit (INDEPENDENT_AMBULATORY_CARE_PROVIDER_SITE_OTHER): Payer: PPO | Admitting: Physician Assistant

## 2019-10-10 ENCOUNTER — Encounter: Payer: Self-pay | Admitting: Physician Assistant

## 2019-10-10 VITALS — BP 138/76 | HR 68 | Temp 97.2°F | Ht 67.0 in | Wt 230.0 lb

## 2019-10-10 DIAGNOSIS — R0989 Other specified symptoms and signs involving the circulatory and respiratory systems: Secondary | ICD-10-CM | POA: Diagnosis not present

## 2019-10-10 DIAGNOSIS — Z136 Encounter for screening for cardiovascular disorders: Secondary | ICD-10-CM | POA: Diagnosis not present

## 2019-10-10 DIAGNOSIS — I700 Atherosclerosis of aorta: Secondary | ICD-10-CM

## 2019-10-10 DIAGNOSIS — E118 Type 2 diabetes mellitus with unspecified complications: Secondary | ICD-10-CM

## 2019-10-10 DIAGNOSIS — Z79899 Other long term (current) drug therapy: Secondary | ICD-10-CM | POA: Diagnosis not present

## 2019-10-10 DIAGNOSIS — Z6837 Body mass index (BMI) 37.0-37.9, adult: Secondary | ICD-10-CM

## 2019-10-10 DIAGNOSIS — E785 Hyperlipidemia, unspecified: Secondary | ICD-10-CM | POA: Diagnosis not present

## 2019-10-10 DIAGNOSIS — I872 Venous insufficiency (chronic) (peripheral): Secondary | ICD-10-CM

## 2019-10-10 DIAGNOSIS — I10 Essential (primary) hypertension: Secondary | ICD-10-CM

## 2019-10-10 DIAGNOSIS — K2100 Gastro-esophageal reflux disease with esophagitis, without bleeding: Secondary | ICD-10-CM

## 2019-10-10 DIAGNOSIS — K219 Gastro-esophageal reflux disease without esophagitis: Secondary | ICD-10-CM

## 2019-10-10 DIAGNOSIS — D649 Anemia, unspecified: Secondary | ICD-10-CM

## 2019-10-10 DIAGNOSIS — E1122 Type 2 diabetes mellitus with diabetic chronic kidney disease: Secondary | ICD-10-CM

## 2019-10-10 DIAGNOSIS — Z0000 Encounter for general adult medical examination without abnormal findings: Secondary | ICD-10-CM | POA: Diagnosis not present

## 2019-10-10 DIAGNOSIS — M1710 Unilateral primary osteoarthritis, unspecified knee: Secondary | ICD-10-CM

## 2019-10-10 DIAGNOSIS — E1169 Type 2 diabetes mellitus with other specified complication: Secondary | ICD-10-CM | POA: Diagnosis not present

## 2019-10-10 DIAGNOSIS — N183 Chronic kidney disease, stage 3 unspecified: Secondary | ICD-10-CM

## 2019-10-10 DIAGNOSIS — G9009 Other idiopathic peripheral autonomic neuropathy: Secondary | ICD-10-CM

## 2019-10-10 DIAGNOSIS — E039 Hypothyroidism, unspecified: Secondary | ICD-10-CM | POA: Diagnosis not present

## 2019-10-10 DIAGNOSIS — K5730 Diverticulosis of large intestine without perforation or abscess without bleeding: Secondary | ICD-10-CM

## 2019-10-10 DIAGNOSIS — E782 Mixed hyperlipidemia: Secondary | ICD-10-CM | POA: Diagnosis not present

## 2019-10-10 DIAGNOSIS — E559 Vitamin D deficiency, unspecified: Secondary | ICD-10-CM | POA: Diagnosis not present

## 2019-10-10 DIAGNOSIS — Z0001 Encounter for general adult medical examination with abnormal findings: Secondary | ICD-10-CM

## 2019-10-10 DIAGNOSIS — Z87891 Personal history of nicotine dependence: Secondary | ICD-10-CM

## 2019-10-10 DIAGNOSIS — Z85828 Personal history of other malignant neoplasm of skin: Secondary | ICD-10-CM

## 2019-10-10 MED ORDER — PANTOPRAZOLE SODIUM 20 MG PO TBEC: 20.0000 mg | DELAYED_RELEASE_TABLET | Freq: Every day | ORAL | 1 refills | Status: DC

## 2019-10-10 NOTE — Patient Instructions (Addendum)
Stop the verapamil Follow up 1-2  Months Monitor bp   0. Bradycardia, Adult Bradycardia is a slower-than-normal heartbeat. A normal resting heart rate for an adult ranges from 60 to 100 beats per minute. With bradycardia, the resting heart rate is less than 60 beats per minute. Bradycardia can prevent enough oxygen from reaching certain areas of your body when you are active. It can be serious if it keeps enough oxygen from reaching your brain and other parts of your body. Bradycardia is not a problem for everyone. For some healthy adults, a slow resting heart rate is normal. What are the causes? This condition may be caused by:  A problem with the heart, including: ? A problem with the heart's electrical system, such as a heart block. With a heart block, electrical signals between the chambers of the heart are partially or completely blocked, so they are not able to work as they should. ? A problem with the heart's natural pacemaker (sinus node). ? Heart disease. ? A heart attack. ? Heart damage. ? Lyme disease. ? A heart infection. ? A heart condition that is present at birth (congenital heart defect).  Certain medicines that treat heart conditions.  Certain conditions, such as hypothyroidism and obstructive sleep apnea.  Problems with the balance of chemicals and other substances, like potassium, in the blood.  Trauma.  Radiation therapy. What increases the risk? You are more likely to develop this condition if you:  Are age 30 or older.  Have high blood pressure (hypertension), high cholesterol (hyperlipidemia), or diabetes.  Drink heavily, use tobacco or nicotine products, or use drugs. What are the signs or symptoms? Symptoms of this condition include:  Light-headedness.  Feeling faint or fainting.  Fatigue and weakness.  Trouble with activity or exercise.  Shortness of breath.  Chest pain (angina).  Drowsiness.  Confusion.  Dizziness. How is this  diagnosed? This condition may be diagnosed based on:  Your symptoms.  Your medical history.  A physical exam. During the exam, your health care provider will listen to your heartbeat and check your pulse. To confirm the diagnosis, your health care provider may order tests, such as:  Blood tests.  An electrocardiogram (ECG). This test records the heart's electrical activity. The test can show how fast your heart is beating and whether the heartbeat is steady.  A test in which you wear a portable device (event recorder or Holter monitor) to record your heart's electrical activity while you go about your day.  Anexercise test. How is this treated? Treatment for this condition depends on the cause of the condition and how severe your symptoms are. Treatment may involve:  Treatment of the underlying condition.  Changing your medicines or how much medicine you take.  Having a small, battery-operated device called a pacemaker implanted under the skin. When bradycardia occurs, this device can be used to increase your heart rate and help your heart beat in a regular rhythm. Follow these instructions at home: Lifestyle   Manage any health conditions that contribute to bradycardia as told by your health care provider.  Follow a heart-healthy diet. A nutrition specialist (dietitian) can help educate you about healthy food options and changes.  Follow an exercise program that is approved by your health care provider.  Maintain a healthy weight.  Try to reduce or manage your stress, such as with yoga or meditation. If you need help reducing stress, ask your health care provider.  Do not use any products that contain nicotine  or tobacco, such as cigarettes, e-cigarettes, and chewing tobacco. If you need help quitting, ask your health care provider.  Do not use illegal drugs.  Limit alcohol intake to no more than 1 drink a day for nonpregnant women and 2 drinks a day for men. Be aware of  how much alcohol is in your drink. In the [place] one drink equals one 12 oz bottle of beer (355 mL), one 5 oz glass of wine (148 mL), or one 1 oz glass of hard liquor (44 mL). General instructions  Take over-the-counter and prescription medicines only as told by your health care provider.  Keep all follow-up visits as told by your health care provider. This is important. How is this prevented? In some cases, bradycardia may be prevented by:  Treating underlying medical problems.  Stopping behaviors or medicines that can trigger the condition. Contact a health care provider if you:  Feel light-headed or dizzy.  Almost faint.  Feel weak or are easily fatigued during physical activity.  Experience confusion or have memory problems. Get help right away if:  You faint.  You have: ? An irregular heartbeat (palpitations). ? Chest pain. ? Trouble breathing. Summary  Bradycardia is a slower-than-normal heartbeat. With bradycardia, the resting heart rate is less than 60 beats per minute.  Treatment for this condition depends on the cause.  Manage any health conditions that contribute to bradycardia as told by your health care provider.  Do not use any products that contain nicotine or tobacco, such as cigarettes, e-cigarettes, and chewing tobacco, and limit alcohol intake.  Keep all follow-up visits as told by your health care provider. This is important. This information is not intended to replace advice given to you by your health care provider. Make sure you discuss any questions you have with your health care provider. Document Released: 07/30/2002 Document Revised: 05/21/2018 Document Reviewed: 04/18/2018 Elsevier Patient Education  2020 [employer].

## 2019-10-11 ENCOUNTER — Encounter: Payer: Self-pay | Admitting: Physician Assistant

## 2019-10-11 LAB — VITAMIN D 25 HYDROXY (VIT D DEFICIENCY, FRACTURES): Vit D, 25-Hydroxy: 50 ng/mL (ref 30–100)

## 2019-10-11 LAB — LIPID PANEL
Cholesterol: 146 mg/dL (ref <200)
HDL: 43 mg/dL — ABNORMAL LOW (ref >=50)
LDL Cholesterol (Calc): 77 mg/dL (calc)
Non-HDL Cholesterol (Calc): 103 mg/dL (calc) (ref <130)
Total CHOL/HDL Ratio: 3.4 (calc) (ref <5.0)
Triglycerides: 159 mg/dL — ABNORMAL HIGH (ref <150)

## 2019-10-11 LAB — COMPLETE METABOLIC PANEL WITH GFR
AG Ratio: 1.6 (calc) (ref 1.0–2.5)
ALT: 17 U/L (ref 6–29)
AST: 15 U/L (ref 10–35)
Albumin: 4 g/dL (ref 3.6–5.1)
Alkaline phosphatase (APISO): 106 U/L (ref 37–153)
BUN/Creatinine Ratio: 25 (calc) — ABNORMAL HIGH (ref 6–22)
BUN: 26 mg/dL — ABNORMAL HIGH (ref 7–25)
CO2: 26 mmol/L (ref 20–32)
Calcium: 9.2 mg/dL (ref 8.6–10.4)
Chloride: 106 mmol/L (ref 98–110)
Creat: 1.03 mg/dL — ABNORMAL HIGH (ref 0.60–0.93)
GFR, Est African American: 63 mL/min/{1.73_m2} (ref >=60)
GFR, Est Non African American: 55 mL/min/{1.73_m2} — ABNORMAL LOW (ref >=60)
Globulin: 2.5 g/dL (calc) (ref 1.9–3.7)
Glucose, Bld: 83 mg/dL (ref 65–99)
Potassium: 4.2 mmol/L (ref 3.5–5.3)
Sodium: 142 mmol/L (ref 135–146)
Total Bilirubin: 0.6 mg/dL (ref 0.2–1.2)
Total Protein: 6.5 g/dL (ref 6.1–8.1)

## 2019-10-11 LAB — CBC WITH DIFFERENTIAL/PLATELET
Absolute Monocytes: 731 cells/uL (ref 200–950)
Basophils Absolute: 64 cells/uL (ref 0–200)
Basophils Relative: 0.6 %
Eosinophils Absolute: 318 cells/uL (ref 15–500)
Eosinophils Relative: 3 %
HCT: 41.7 % (ref 35.0–45.0)
Hemoglobin: 13.5 g/dL (ref 11.7–15.5)
Lymphs Abs: 3445 cells/uL (ref 850–3900)
MCH: 27.1 pg (ref 27.0–33.0)
MCHC: 32.4 g/dL (ref 32.0–36.0)
MCV: 83.7 fL (ref 80.0–100.0)
MPV: 11.5 fL (ref 7.5–12.5)
Monocytes Relative: 6.9 %
Neutro Abs: 6042 cells/uL (ref 1500–7800)
Neutrophils Relative %: 57 %
Platelets: 278 10*3/uL (ref 140–400)
RBC: 4.98 10*6/uL (ref 3.80–5.10)
RDW: 13.8 % (ref 11.0–15.0)
Total Lymphocyte: 32.5 %
WBC: 10.6 10*3/uL (ref 3.8–10.8)

## 2019-10-11 LAB — URINALYSIS, ROUTINE W REFLEX MICROSCOPIC
Bilirubin Urine: NEGATIVE
Glucose, UA: NEGATIVE
Hgb urine dipstick: NEGATIVE
Ketones, ur: NEGATIVE
Leukocytes,Ua: NEGATIVE
Nitrite: NEGATIVE
Protein, ur: NEGATIVE
Specific Gravity, Urine: 1.007 (ref 1.001–1.03)
pH: <=5 (ref 5.0–8.0)

## 2019-10-11 LAB — MICROALBUMIN / CREATININE URINE RATIO
Creatinine, Urine: 24 mg/dL (ref 20–275)
Microalb, Ur: <0.2 mg/dL

## 2019-10-11 LAB — MAGNESIUM: Magnesium: 2.1 mg/dL (ref 1.5–2.5)

## 2019-10-11 LAB — HEMOGLOBIN A1C
Hgb A1c MFr Bld: 5.8 % of total Hgb — ABNORMAL HIGH (ref <5.7)
Mean Plasma Glucose: 120 (calc)
eAG (mmol/L): 6.6 (calc)

## 2019-10-11 LAB — TSH: TSH: 0.3 mIU/L — ABNORMAL LOW (ref 0.40–4.50)

## 2019-10-28 DIAGNOSIS — Z803 Family history of malignant neoplasm of breast: Secondary | ICD-10-CM | POA: Diagnosis not present

## 2019-10-28 DIAGNOSIS — Z1231 Encounter for screening mammogram for malignant neoplasm of breast: Secondary | ICD-10-CM | POA: Diagnosis not present

## 2019-10-28 LAB — HM MAMMOGRAPHY

## 2019-10-31 ENCOUNTER — Encounter: Payer: Self-pay | Admitting: Internal Medicine

## 2019-11-06 ENCOUNTER — Ambulatory Visit: Payer: PPO | Admitting: Physician Assistant

## 2019-11-18 NOTE — Progress Notes (Signed)
Subjective:    Patient ID: [name], female    DOB: 09/10/1946, 73 y.o.   MRN: [id number]  HPI 73 y.o. obese WF with history of DM2, chol, HTN, hypothyroidism with family history of MI in her dad at age 59 presents for follow up for sinus bradycardia at 51 with IRBBB.   At her CPE last visit she had sinus bradycardia, her verapamil was stopped and she is here for follow up. She states since that time she has had some palpitations last for 2-5 mins, no accompaniments with it, and indigestion, states she was woke up the night after christmas day with substernal burning, no accompaniments, lasted an hour and improved. She is under a lot of emotional stress with her daughter and granddaughter.  She is on protonix and the pepcid. She is on meloixcam 15 mg daily no ETOH.   Denies symptoms of sleep apnea, no snoring, no fatigue in the AM, nocturia x 1 that is unchanged.  BMI is Body mass index is 35.21 kg/m., she is working on diet and exercise. Wt Readings from Last 3 Encounters:  11/21/19 224 lb 12.8 oz (102 kg)  10/10/19 230 lb (104.3 kg)  07/10/19 231 lb 9.6 oz (105.1 kg)   Lab Results  Component Value Date   TSH 0.30 (L) 10/10/2019   Lab Results  Component Value Date   HGBA1C 5.8 (H) 10/10/2019   Lab Results  Component Value Date   CHOL 146 10/10/2019   HDL 43 (L) 10/10/2019   LDLCALC 77 10/10/2019   TRIG 159 (H) 10/10/2019   CHOLHDL 3.4 10/10/2019   Blood pressure 130/62, pulse 76, temperature (!) 97 F (36.1 C), resp. rate 16, height 5\' 7"  (1.702 m), weight 224 lb 12.8 oz (102 kg).  Medications  Current Outpatient Medications (Endocrine & Metabolic):  .  levothyroxine (SYNTHROID) 75 MCG tablet, Take 1 to 1.5 tablet daily as directed on an empty stomach with only water for 30 minutes & No Antacid meds, Calcium or Magnesium for 4 hrs  Current Outpatient Medications (Cardiovascular):  .  atorvastatin (LIPITOR) 40 MG tablet, Take 1 tablet Daily  for Cholesterol .   furosemide (LASIX) 20 MG tablet, Take 1 tablet 2 x /day for BP & Fluid Retention   Current Outpatient Medications (Analgesics):  Marland Kitchen  Acetaminophen (TYLENOL ARTHRITIS PAIN PO), Take by mouth. Marland Kitchen  aspirin 81 MG tablet, Take 81 mg by mouth daily. .  meloxicam (MOBIC) 7.5 MG tablet, TAKE 1 TABLET DAILY WITH   FOOD FOR PAIN CAN TAKE A SECOND PILL LATER IN THE DAY IF YOU NEED IT   Current Outpatient Medications (Other):  .  b complex vitamins tablet, Take 1 tablet by mouth daily. .  diclofenac sodium (VOLTAREN) 1 % GEL, Apply 4 g topically 4 (four) times daily. Marland Kitchen  dicyclomine (BENTYL) 20 MG tablet, Take 1 tablet (20 mg total) by mouth 3 (three) times daily as needed for spasms. .  dorzolamide-timolol (COSOPT) 22.3-6.8 MG/ML ophthalmic solution, USE 1 DROP INTO THE AFFECTED EYE 2 TIMES DAILY .  famotidine (PEPCID) 40 MG tablet, Take 1 tablet at Bedtime for Indigestion & Heartburn .  Multiple Vitamin (MULTIVITAMIN) tablet, Take 1 tablet by mouth daily. .  ondansetron (ZOFRAN) 4 MG tablet, Take 1 tablet (4 mg total) by mouth daily as needed for nausea or vomiting. .  pantoprazole (PROTONIX) 20 MG tablet, Take 1 tablet (20 mg total) by mouth daily. .  pregabalin (LYRICA) 100 MG capsule, Take 1 capsule (100  mg total) by mouth 3 (three) times daily.  Problem list She has Diverticulosis of large intestine; History of tobacco use; Hyperlipidemia, mixed; Hypothyroidism; Anemia; Controlled type 2 diabetes mellitus with complication, without long-term current use of insulin ([place]); Class 2 severe obesity due to excess calories with serious comorbidity and body mass index (BMI) of 37.0 to 37.9 in adult [facility]); Chronic venous insufficiency; Peripheral autonomic neuropathy of unknown cause; Labile hypertension; History of basal cell carcinoma (BCC); Osteoarthritis of knee; Atherosclerosis of aorta ([place]); Vitamin D deficiency; Medication management; Gastroesophageal reflux disease with esophagitis; Type 2 diabetes  mellitus with hyperlipidemia ([place]); and CKD stage 3 secondary to diabetes ([place]) on their problem list.   Review of Systems See HPI    Objective:   Physical Exam Constitutional:      Appearance: She is obese.  Cardiovascular:     Rate and Rhythm: Normal rate and regular rhythm.  Pulmonary:     Effort: Pulmonary effort is normal.     Breath sounds: Normal breath sounds.  Abdominal:     Palpations: Abdomen is soft.     Tenderness: There is no abdominal tenderness. There is no guarding or rebound.  Musculoskeletal:     Cervical back: Normal range of motion and neck supple.  Neurological:     Mental Status: She is alert.           Assessment & Plan:   Q waves on EKG, new from last month With emotional stress, several risk factors including family history, HTN, history of DM, chol will send to the ER for evaluation/troponin Patient agrees, will go, no active CP at this time.   Sinus bradycardia -     EKG 12-Lead  Morbid obesity (HCC) -     Ambulatory referral to Cardiology  Palpitations -     CBC with Diff -     COMPLETE METABOLIC PANEL WITH GFR -     TSH -     EKG 12-Lead -     verapamil (CALAN) 80 MG tablet; Take AS needed for palpitations up to 3 x a day -     Ambulatory referral to Cardiology  Gastroesophageal reflux disease -     pantoprazole (PROTONIX) 40 MG tablet; Take 1 tablet (40 mg total) by mouth daily.  Other orders -     gabapentin (NEURONTIN) 800 MG tablet; Take 1 tablet (800 mg total) by mouth 3 (three) times daily.

## 2019-11-21 ENCOUNTER — Encounter (HOSPITAL_COMMUNITY): Payer: Self-pay | Admitting: Emergency Medicine

## 2019-11-21 ENCOUNTER — Emergency Department (HOSPITAL_COMMUNITY)
Admission: EM | Admit: 2019-11-21 | Discharge: 2019-11-22 | Disposition: A | Discharge status: Home / Self Care | Payer: PPO | Attending: Emergency Medicine | Admitting: Emergency Medicine

## 2019-11-21 ENCOUNTER — Other Ambulatory Visit: Payer: Self-pay

## 2019-11-21 ENCOUNTER — Ambulatory Visit (INDEPENDENT_AMBULATORY_CARE_PROVIDER_SITE_OTHER): Payer: PPO | Admitting: Physician Assistant

## 2019-11-21 ENCOUNTER — Emergency Department (HOSPITAL_COMMUNITY): Payer: PPO

## 2019-11-21 VITALS — BP 130/62 | HR 76 | Temp 97.0°F | Resp 16 | Ht 67.0 in | Wt 224.8 lb

## 2019-11-21 DIAGNOSIS — N183 Chronic kidney disease, stage 3 unspecified: Secondary | ICD-10-CM | POA: Diagnosis not present

## 2019-11-21 DIAGNOSIS — K219 Gastro-esophageal reflux disease without esophagitis: Secondary | ICD-10-CM

## 2019-11-21 DIAGNOSIS — K2100 Gastro-esophageal reflux disease with esophagitis, without bleeding: Secondary | ICD-10-CM | POA: Diagnosis not present

## 2019-11-21 DIAGNOSIS — Z79899 Other long term (current) drug therapy: Secondary | ICD-10-CM | POA: Diagnosis not present

## 2019-11-21 DIAGNOSIS — R0789 Other chest pain: Secondary | ICD-10-CM | POA: Diagnosis not present

## 2019-11-21 DIAGNOSIS — R002 Palpitations: Secondary | ICD-10-CM | POA: Diagnosis not present

## 2019-11-21 DIAGNOSIS — Z85828 Personal history of other malignant neoplasm of skin: Secondary | ICD-10-CM | POA: Diagnosis not present

## 2019-11-21 DIAGNOSIS — E1122 Type 2 diabetes mellitus with diabetic chronic kidney disease: Secondary | ICD-10-CM | POA: Diagnosis not present

## 2019-11-21 DIAGNOSIS — E039 Hypothyroidism, unspecified: Secondary | ICD-10-CM | POA: Insufficient documentation

## 2019-11-21 DIAGNOSIS — R001 Bradycardia, unspecified: Secondary | ICD-10-CM | POA: Diagnosis not present

## 2019-11-21 DIAGNOSIS — I129 Hypertensive chronic kidney disease with stage 1 through stage 4 chronic kidney disease, or unspecified chronic kidney disease: Secondary | ICD-10-CM | POA: Diagnosis not present

## 2019-11-21 DIAGNOSIS — R079 Chest pain, unspecified: Secondary | ICD-10-CM | POA: Diagnosis not present

## 2019-11-21 DIAGNOSIS — Z87891 Personal history of nicotine dependence: Secondary | ICD-10-CM | POA: Diagnosis not present

## 2019-11-21 LAB — BASIC METABOLIC PANEL
Anion gap: 9 (ref 5–15)
BUN: 15 mg/dL (ref 8–23)
CO2: 26 mmol/L (ref 22–32)
Calcium: 8.8 mg/dL — ABNORMAL LOW (ref 8.9–10.3)
Chloride: 106 mmol/L (ref 98–111)
Creatinine, Ser: 0.96 mg/dL (ref 0.44–1.00)
GFR calc Af Amer: >60 mL/min (ref >60)
GFR calc non Af Amer: 60 mL/min — ABNORMAL LOW (ref >60)
Glucose, Bld: 103 mg/dL — ABNORMAL HIGH (ref 70–99)
Potassium: 3.3 mmol/L — ABNORMAL LOW (ref 3.5–5.1)
Sodium: 141 mmol/L (ref 135–145)

## 2019-11-21 LAB — TROPONIN I (HIGH SENSITIVITY)
Troponin I (High Sensitivity): 5 ng/L (ref <18)
Troponin I (High Sensitivity): 4 ng/L (ref <18)

## 2019-11-21 LAB — CBC
HCT: 44.5 % (ref 36.0–46.0)
Hemoglobin: 14.1 g/dL (ref 12.0–15.0)
MCH: 27.3 pg (ref 26.0–34.0)
MCHC: 31.7 g/dL (ref 30.0–36.0)
MCV: 86.1 fL (ref 80.0–100.0)
Platelets: 253 10*3/uL (ref 150–400)
RBC: 5.17 MIL/uL — ABNORMAL HIGH (ref 3.87–5.11)
RDW: 14.3 % (ref 11.5–15.5)
WBC: 11.1 10*3/uL — ABNORMAL HIGH (ref 4.0–10.5)
nRBC: 0 % (ref 0.0–0.2)

## 2019-11-21 MED ORDER — SODIUM CHLORIDE 0.9% FLUSH: 3.0000 mL | Freq: Once | INTRAVENOUS | Status: DC

## 2019-11-21 MED ORDER — VERAPAMIL HCL 80 MG PO TABS: ORAL_TABLET | ORAL | 1 refills | Status: DC

## 2019-11-21 MED ORDER — GABAPENTIN 800 MG PO TABS: 800.0000 mg | ORAL_TABLET | Freq: Three times a day (TID) | ORAL | 3 refills | Status: DC

## 2019-11-21 MED ORDER — PANTOPRAZOLE SODIUM 40 MG PO TBEC: 40.0000 mg | DELAYED_RELEASE_TABLET | Freq: Every day | ORAL | 1 refills | Status: DC

## 2019-11-21 NOTE — Patient Instructions (Addendum)
Stop the fish oil Stop the mobic, only take as needed  Take the protonix 2 x a day or the 40 mg  Can switch to from the lyrica/pregabalin to the gabapentin  [place]  Being a woman you may not have the typical symptoms of a heart attack.  You may not have any pain OR you may have atypical pain such as jaw pain, upper back pain, arm pain, "my bra feels to tight" and you will often have symptoms with it like below.  Symptoms for a heart attack will likely occur when you exert your self or exercise and include: Shortness of breath Sweating Nausea Dizziness Fast or irregular heart beats Fatigue   It makes me feel better if my patients get their heart rate up with exercise once or twice a week and pay close attention to your body. If there is ANY change in your exercise capacity or if you have symptoms above, please STOP and call 911 or call to come to the office.   Here is some information to help you keep your heart healthy: Move it! - Aim for 30 mins of activity every day. Take it slowly at first. Talk to [place] before starting any new exercise program.   Lose it.  -Body Mass Index (BMI) can indicate if you need to lose weight. A healthy range is 18.5-24.9. For a BMI calculator, go to [employer]  Waist Management -Excess abdominal fat is a risk factor for heart disease, diabetes, asthma, stroke and more. Ideal waist circumference is less than 35" for women and less than 40" for men.   Eat Right -focus on fruits, vegetables, whole grains, and meals you make yourself. Avoid foods with trans fat and high sugar/sodium content.   Snooze or Snore? - Loud snoring can be a sign of sleep apnea, a significant risk factor for high blood pressure, heart attach, stroke, and heart arrhythmias.  Kick the habit -Quit Smoking! Avoid second hand smoke. A single cigarette raises your blood pressure for 20 mins and increases the risk of heart attack and stroke for the next 24 hours.    Are Aspirin and Supplements right for you? -Add ENTERIC COATED low dose 81 mg Aspirin daily OR can do every other day if you have easy bruising to protect your heart and head. As well as to reduce risk of Colon Cancer by 20 %, Skin Cancer by 26 % , Melanoma by 46% and Pancreatic cancer by 60%  Say "No to Stress -There may be little you can do about problems that cause stress. However, techniques such as long walks, meditation, and exercise can help you manage it.   Start Now! - Make changes one at a time and set reasonable goals to increase your likelihood of success.      Food Choices for Gastroesophageal Reflux Disease, Adult When you have gastroesophageal reflux disease (GERD), the foods you eat and your eating habits are very important. Choosing the right foods can help ease the discomfort of GERD. Consider working with a diet and nutrition specialist (dietitian) to help you make healthy food choices. What general guidelines should I follow?  Eating plan  Choose healthy foods low in fat, such as fruits, vegetables, whole grains, low-fat dairy products, and lean meat, fish, and poultry.  Eat frequent, small meals instead of three large meals each day. Eat your meals slowly, in a relaxed setting. Avoid bending over or lying down until 2-3 hours after eating.  Limit high-fat foods  such as fatty meats or fried foods.  Limit your intake of oils, butter, and shortening to less than 8 teaspoons each day.  Avoid the following: ? Foods that cause symptoms. These may be different for different people. Keep a food diary to keep track of foods that cause symptoms. ? Alcohol. ? Drinking large amounts of liquid with meals. ? Eating meals during the 2-3 hours before bed.  Cook foods using methods other than frying. This may include baking, grilling, or broiling. Lifestyle  Maintain a healthy weight. Ask your health care provider what weight is healthy for you. If you need to lose weight,  work with your health care provider to do so safely.  Exercise for at least 30 minutes on 5 or more days each week, or as told by your health care provider.  Avoid wearing clothes that fit tightly around your waist and chest.  Do not use any products that contain nicotine or tobacco, such as cigarettes and e-cigarettes. If you need help quitting, ask your health care provider.  Sleep with the head of your bed raised. Use a wedge under the mattress or blocks under the bed frame to raise the head of the bed. What foods are not recommended? The items listed may not be a complete list. Talk with your dietitian about what dietary choices are best for you. Grains Pastries or quick breads with added fat. [place] toast. Vegetables Deep fried vegetables. [place] fries. Any vegetables prepared with added fat. Any vegetables that cause symptoms. For some people this may include tomatoes and tomato products, chili peppers, onions and garlic, and horseradish. Fruits Any fruits prepared with added fat. Any fruits that cause symptoms. For some people this may include citrus fruits, such as oranges, grapefruit, pineapple, and lemons. Meats and other protein foods High-fat meats, such as fatty beef or pork, hot dogs, ribs, ham, sausage, salami and bacon. Fried meat or protein, including fried fish and fried chicken. Nuts and nut butters. Dairy Whole milk and chocolate milk. Sour cream. Cream. Ice cream. Cream cheese. Milk shakes. Beverages Coffee and tea, with or without caffeine. Carbonated beverages. Sodas. Energy drinks. Fruit juice made with acidic fruits (such as orange or grapefruit). Tomato juice. Alcoholic drinks. Fats and oils Butter. Margarine. Shortening. Ghee. Sweets and desserts Chocolate and cocoa. Donuts. Seasoning and other foods Pepper. Peppermint and spearmint. Any condiments, herbs, or seasonings that cause symptoms. For some people, this may include curry, hot sauce, or vinegar-based  salad dressings. Summary  When you have gastroesophageal reflux disease (GERD), food and lifestyle choices are very important to help ease the discomfort of GERD.  Eat frequent, small meals instead of three large meals each day. Eat your meals slowly, in a relaxed setting. Avoid bending over or lying down until 2-3 hours after eating.  Limit high-fat foods such as fatty meat or fried foods. This information is not intended to replace advice given to you by your health care provider. Make sure you discuss any questions you have with your health care provider. Document Revised: 02/28/2019 Document Reviewed: 11/08/2016 Elsevier Patient Education  [place].

## 2019-11-21 NOTE — ED Triage Notes (Signed)
Pt. Stated, I was sent over by Dr cause my EKG looked abnormal. I have had indigestion which is abnormal and some burning in my chest like indigestion, this started on Dec. 27.

## 2019-11-22 LAB — TROPONIN I (HIGH SENSITIVITY): Troponin I (High Sensitivity): 5 ng/L (ref <18)

## 2019-11-22 NOTE — ED Provider Notes (Signed)
[facility] EMERGENCY DEPARTMENT Provider Note   CSN: [id number] Arrival date & time: 11/21/19  1616     History Chief Complaint  Patient presents with  . Gastroesophageal Reflux  . Abnormal ECG  . Chest Pain    [name] is a 74 y.o. female.  Patient is a 74 year old female with a history of diabetes, hypertension, hyperlipidemia, obesity, GERD who is presenting today due to intermittent chest pain and occasional rapid heartbeat.  Patient states that this started on 27 December she woke up in the morning and noticed a heavy discomfort in the center of her chest.  It did not radiate she did not have nausea, vomiting, diaphoresis or shortness of breath with this.  She sat up on the side of her bed and she states within an hour it improved.  However since that time she has had intermittent episodes of the same.  She cannot think of anything that causes the pain but she does notice it seems to be the worst in the middle of the night and seems to wake her up after she has been lying down.  Eating does not seem to affect this, exertion does not seem to affect it.  She has been more tired over the last month but does not have exertional dyspnea.  She has had no recent lower extremity edema.  She saw her PA 3 weeks ago and at that time had a low heart rate and her verapamil was discontinued.  She was following up yesterday and had a repeat EKG that they were concerned may have new Q waves.  Patient states she has had occasional bouts of this discomfort while waiting in the waiting room but is not currently having any pain.  She is also not having palpitations at this time.  She does admit to being under severe stress since February and says it has definitely taken a toll on her.  She denies any alcohol drug or tobacco use.  She was still taking meloxicam but yesterday her doctor told her to stop that as well as fish oil and the pregabalin.  She did have a catheterization in the 90s  where she was found to have coronary spasm but no acute disease.  She also states her father had a heart attack at 43 which caused his death.  She denies any fever, cough, vomiting or diarrhea.  She has lost approximately 18 pounds in the last few months but contributes that to not eating as much as she used to.  The history is provided by the patient.  Gastroesophageal Reflux This is a recurrent problem. Associated symptoms include chest pain.  Chest Pain      Past Medical History:  Diagnosis Date  . Anemia   . Chest tightness   . Fatigue   . GERD (gastroesophageal reflux disease)   . Hyperlipidemia   . Hypothyroid   . Internal hemorrhoids 05/04/2009   Qualifier: Diagnosis of  By: Nelson-Smith CMA (AAMA), Dottie    . Myalgia   . Obesity   . Prediabetes   . Ulcer   . Varicose veins     Patient Active Problem List   Diagnosis Date Noted  . Type 2 diabetes mellitus with hyperlipidemia ([place]) 10/09/2019  . CKD stage 3 secondary to diabetes ([place]) 10/09/2019  . Gastroesophageal reflux disease with esophagitis 04/01/2019  . Vitamin D deficiency 12/03/2018  . Medication management 12/03/2018  . Atherosclerosis of aorta ([place]) 08/06/2018  . Osteoarthritis of knee 06/12/2018  .  History of basal cell carcinoma (BCC) 08/10/2017  . Labile hypertension 02/27/2017  . Chronic venous insufficiency 06/02/2015  . Peripheral autonomic neuropathy of unknown cause 06/02/2015  . Class 2 severe obesity due to excess calories with serious comorbidity and body mass index (BMI) of 37.0 to 37.9 in adult ([place]) 08/19/2014  . Hypothyroidism   . Anemia   . Controlled type 2 diabetes mellitus with complication, without long-term current use of insulin ([place])   . History of tobacco use 04/09/2012  . Hyperlipidemia, mixed   . Diverticulosis of large intestine 05/04/2009    Past Surgical History:  Procedure Laterality Date  . ABDOMINAL HYSTERECTOMY    . CATARACT EXTRACTION    . CYST REMOVAL HAND Left  07/30/2018  . NASAL SINUS SURGERY    . ROTATOR CUFF REPAIR Left more than 7 years ago   Dr. Percell Miller and Dr. Earleen Newport  . TONSILLECTOMY AND ADENOIDECTOMY       OB History   No obstetric history on file.     Family History  Problem Relation Age of Onset  . Coronary artery disease Father 3  . Dementia Mother   . Cancer Mother        lung  . Dementia Maternal Aunt   . Cancer Maternal Grandmother        lung    Social History   Tobacco Use  . Smoking status: Former Smoker    Packs/day: 0.50    Years: 46.00    Pack years: 23.00    Types: Cigarettes    Quit date: 05/27/2013    Years since quitting: 6.4  . Smokeless tobacco: Never Used  Substance Use Topics  . Alcohol use: Not Currently  . Drug use: Not Currently    Home Medications Prior to Admission medications   Medication Sig Start Date End Date Taking? Authorizing Provider  Acetaminophen (TYLENOL ARTHRITIS PAIN PO) Take by mouth.    [provider]  aspirin 81 MG tablet Take 81 mg by mouth daily.    [provider]  atorvastatin (LIPITOR) 40 MG tablet Take 1 tablet Daily  for Cholesterol 07/02/19   Unk Pinto, MD  b complex vitamins tablet Take 1 tablet by mouth daily.    [provider]  diclofenac sodium (VOLTAREN) 1 % GEL Apply 4 g topically 4 (four) times daily. 07/25/19   Vicie Mutters, PA-C  dicyclomine (BENTYL) 20 MG tablet Take 1 tablet (20 mg total) by mouth 3 (three) times daily as needed for spasms. 04/03/19 04/02/20  Unk Pinto, MD  dorzolamide-timolol (COSOPT) 22.3-6.8 MG/ML ophthalmic solution USE 1 DROP INTO THE AFFECTED EYE 2 TIMES DAILY 03/26/18   [provider]  famotidine (PEPCID) 40 MG tablet Take 1 tablet at Bedtime for Indigestion & Heartburn 10/03/19   Unk Pinto, MD  furosemide (LASIX) 20 MG tablet Take 1 tablet 2 x /day for BP & Fluid Retention 07/02/19   Unk Pinto, MD  gabapentin (NEURONTIN) 800 MG tablet Take 1 tablet (800 mg total) by mouth  3 (three) times daily. 11/21/19   Vicie Mutters, PA-C  levothyroxine (SYNTHROID) 75 MCG tablet Take 1 to 1.5 tablet daily as directed on an empty stomach with only water for 30 minutes & No Antacid meds, Calcium or Magnesium for 4 hrs 05/17/19   Unk Pinto, MD  Multiple Vitamin (MULTIVITAMIN) tablet Take 1 tablet by mouth daily.    [provider]  pantoprazole (PROTONIX) 40 MG tablet Take 1 tablet (40 mg total) by mouth daily. 11/21/19  Vicie Mutters, PA-C  verapamil (CALAN) 80 MG tablet Take AS needed for palpitations up to 3 x a day 11/21/19   Vicie Mutters, PA-C    Allergies    Ciprofloxacin, Macrobid [nitrofurantoin macrocrystal], Penicillins, Procaine hcl, Sulfonamide derivatives, and Tetanus toxoid  Review of Systems   Review of Systems  Cardiovascular: Positive for chest pain.  All other systems reviewed and are negative.   Physical Exam Updated Vital Signs BP (!) 146/63   Pulse 65   Temp 98.4 F (36.9 C) (Oral)   Resp 15   SpO2 98%   Physical Exam Vitals and nursing note reviewed.  Constitutional:      General: She is not in acute distress.    Appearance: She is well-developed. She is obese.  HENT:     Head: Normocephalic and atraumatic.  Eyes:     Conjunctiva/sclera: Conjunctivae normal.     Pupils: Pupils are equal, round, and reactive to light.  Cardiovascular:     Rate and Rhythm: Normal rate and regular rhythm.     Pulses: Normal pulses.     Heart sounds: No murmur.  Pulmonary:     Effort: Pulmonary effort is normal. No respiratory distress.     Breath sounds: Normal breath sounds. No wheezing or rales.  Abdominal:     General: There is no distension.     Palpations: Abdomen is soft.     Tenderness: There is no abdominal tenderness. There is no guarding or rebound.  Musculoskeletal:        General: No tenderness. Normal range of motion.     Cervical back: Normal range of motion and neck supple.     Right lower leg: No edema.      Left lower leg: No edema.  Skin:    General: Skin is warm and dry.     Capillary Refill: Capillary refill takes less than 2 seconds.     Findings: No erythema or rash.  Neurological:     General: No focal deficit present.     Mental Status: She is alert and oriented to person, place, and time. Mental status is at baseline.  Psychiatric:        Mood and Affect: Mood normal.        Behavior: Behavior normal.        Thought Content: Thought content normal.     ED Results / Procedures / Treatments   Labs (all labs ordered are listed, but only abnormal results are displayed) Labs Reviewed  BASIC METABOLIC PANEL - Abnormal; Notable for the following components:      Result Value   Potassium 3.3 (*)    Glucose, Bld 103 (*)    Calcium 8.8 (*)    GFR calc non Af Amer 60 (*)    All other components within normal limits  CBC - Abnormal; Notable for the following components:   WBC 11.1 (*)    RBC 5.17 (*)    All other components within normal limits  TROPONIN I (HIGH SENSITIVITY)  TROPONIN I (HIGH SENSITIVITY)  TROPONIN I (HIGH SENSITIVITY)    EKG EKG Interpretation  Date/Time:  Thursday November 21 2019 17:00:27 EST Ventricular Rate:  68 PR Interval:  134 QRS Duration: 96 QT Interval:  396 QTC Calculation: 421 R Axis:   -21 Text Interpretation: Normal sinus rhythm Incomplete right bundle branch block Possible Anteroseptal infarct , age undetermined Abnormal ECG No significant change since last tracing Confirmed by Merrily Pew [phone]) on 11/22/2019 1:49:58 AM  EKG Interpretation  Date/Time:  Thursday November 21 2019 17:00:27 EST Ventricular Rate:  68 PR Interval:  134 QRS Duration: 96 QT Interval:  396 QTC Calculation: 421 R Axis:   -21 Text Interpretation: Normal sinus rhythm Incomplete right bundle branch block Possible Anteroseptal infarct , age undetermined Abnormal ECG No significant change since last tracing Confirmed by Merrily Pew [phone]) on 11/22/2019 1:49:58  AM       Radiology DG Chest 2 View  Result Date: 11/21/2019 CLINICAL DATA:  chest indigestion , new. Pt stated that she's been experiencing middle chest pain since "a couple of days after Christmas". EXAM: CHEST - 2 VIEW COMPARISON:  Chest radiograph 03/15/2012 FINDINGS: Stable cardiomediastinal contours with normal heart size. There is slight coarsening of the interstitium likely related to prior smoking. No new focal infiltrate. No pneumothorax or pleural effusion. No acute finding in the visualized skeleton. IMPRESSION: No acute cardiopulmonary finding. Electronically Signed   By: Audie Pinto M.D.   On: 11/21/2019 18:13    Procedures Procedures (including critical care time)  Medications Ordered in ED Medications  sodium chloride flush (NS) 0.9 % injection 3 mL (has no administration in time range)    ED Course  I have reviewed the triage vital signs and the nursing notes.  Pertinent labs & imaging results that were available during my care of the patient were reviewed by me and considered in my medical decision making (see chart for details).    MDM Rules/Calculators/A&P                      Pleasant 74 year old female presenting today with atypical chest pain.  Patient has now been waiting in the waiting room for 16 hours and states she will have occasional discomfort but this is nonexertional discomfort that lasts for a short period of time.  She was sent by her doctor due to concern for abnormal EKG.  Patient has had a consistent incomplete right bundle branch block for at least the last month.  Here there is no significant Q waves concerning for infarct.  Initial 2 troponins are 4 and 5 without acute findings.  Labs are otherwise within normal limits.  Chest x-ray within normal limits.  Low suspicion for dissection, PE.  Lower suspicion for ACS at this time however patient does have multiple risk factors.  Also this could be GI in nature or related to stress.  Also patient  recently stopped her verapamil and had continued taking Mobic, Lyrica and fish oil.  All of these were discontinued yesterday at her appointment.  She has no new anemia to suggest stomach ulcer but could be gastritis.  Repeat EKG here unchanged.  Will do a final trop.  Discussed with pt the importance of cardiology and pcp f/u.  Third troponin is also within normal limits at 5.  Patient discharged home for outpatient follow-up.  Final Clinical Impression(s) / ED Diagnoses Final diagnoses:  Atypical chest pain    Rx / DC Orders ED Discharge Orders    None       Blanchie Dessert, MD 11/22/19 [phone]

## 2019-11-22 NOTE — ED Notes (Signed)
Patient verbalizes understanding of discharge instructions. Opportunity for questioning and answers were provided. Pt discharged from ED. 

## 2019-11-22 NOTE — Discharge Instructions (Signed)
If the chest pain starts becoming more frequent or it is happening when you attempt to walk or do any activity or urine becoming severely short of breath, sweaty or vomiting please return immediately.

## 2019-11-27 ENCOUNTER — Other Ambulatory Visit: Payer: Self-pay

## 2019-11-27 ENCOUNTER — Ambulatory Visit: Payer: PPO | Admitting: Podiatry

## 2019-11-27 ENCOUNTER — Encounter: Payer: Self-pay | Admitting: Podiatry

## 2019-11-27 DIAGNOSIS — E1142 Type 2 diabetes mellitus with diabetic polyneuropathy: Secondary | ICD-10-CM | POA: Diagnosis not present

## 2019-11-27 DIAGNOSIS — M79674 Pain in right toe(s): Secondary | ICD-10-CM | POA: Diagnosis not present

## 2019-11-27 DIAGNOSIS — L84 Corns and callosities: Secondary | ICD-10-CM

## 2019-11-27 DIAGNOSIS — B351 Tinea unguium: Secondary | ICD-10-CM | POA: Diagnosis not present

## 2019-11-27 DIAGNOSIS — M79675 Pain in left toe(s): Secondary | ICD-10-CM

## 2019-11-27 NOTE — Patient Instructions (Signed)
Diabetes Mellitus and Foot Care Foot care is an important part of your health, especially when you have diabetes. Diabetes may cause you to have problems because of poor blood flow (circulation) to your feet and legs, which can cause your skin to:  Become thinner and drier.  Break more easily.  Heal more slowly.  Peel and crack. You may also have nerve damage (neuropathy) in your legs and feet, causing decreased feeling in them. This means that you may not notice minor injuries to your feet that could lead to more serious problems. Noticing and addressing any potential problems early is the best way to prevent future foot problems. How to care for your feet Foot hygiene  Wash your feet daily with warm water and mild soap. Do not use hot water. Then, pat your feet and the areas between your toes until they are completely dry. Do not soak your feet as this can dry your skin.  Trim your toenails straight across. Do not dig under them or around the cuticle. File the edges of your nails with an emery board or nail file.  Apply a moisturizing lotion or petroleum jelly to the skin on your feet and to dry, brittle toenails. Use lotion that does not contain alcohol and is unscented. Do not apply lotion between your toes. Shoes and socks  Wear clean socks or stockings every day. Make sure they are not too tight. Do not wear knee-high stockings since they may decrease blood flow to your legs.  Wear shoes that fit properly and have enough cushioning. Always look in your shoes before you put them on to be sure there are no objects inside.  To break in new shoes, wear them for just a few hours a day. This prevents injuries on your feet. Wounds, scrapes, corns, and calluses  Check your feet daily for blisters, cuts, bruises, sores, and redness. If you cannot see the bottom of your feet, use a mirror or ask someone for help.  Do not cut corns or calluses or try to remove them with medicine.  If you  find a minor scrape, cut, or break in the skin on your feet, keep it and the skin around it clean and dry. You may clean these areas with mild soap and water. Do not clean the area with peroxide, alcohol, or iodine.  If you have a wound, scrape, corn, or callus on your foot, look at it several times a day to make sure it is healing and not infected. Check for: ? Redness, swelling, or pain. ? Fluid or blood. ? Warmth. ? Pus or a bad smell. General instructions  Do not cross your legs. This may decrease blood flow to your feet.  Do not use heating pads or hot water bottles on your feet. They may burn your skin. If you have lost feeling in your feet or legs, you may not know this is happening until it is too late.  Protect your feet from hot and cold by wearing shoes, such as at the beach or on hot pavement.  Schedule a complete foot exam at least once a year (annually) or more often if you have foot problems. If you have foot problems, report any cuts, sores, or bruises to your health care provider immediately. Contact a health care provider if:  You have a medical condition that increases your risk of infection and you have any cuts, sores, or bruises on your feet.  You have an injury that is not   healing.  You have redness on your legs or feet.  You feel burning or tingling in your legs or feet.  You have pain or cramps in your legs and feet.  Your legs or feet are numb.  Your feet always feel cold.  You have pain around a toenail. Get help right away if:  You have a wound, scrape, corn, or callus on your foot and: ? You have pain, swelling, or redness that gets worse. ? You have fluid or blood coming from the wound, scrape, corn, or callus. ? Your wound, scrape, corn, or callus feels warm to the touch. ? You have pus or a bad smell coming from the wound, scrape, corn, or callus. ? You have a fever. ? You have a red line going up your leg. Summary  Check your feet every day  for cuts, sores, red spots, swelling, and blisters.  Moisturize feet and legs daily.  Wear shoes that fit properly and have enough cushioning.  If you have foot problems, report any cuts, sores, or bruises to your health care provider immediately.  Schedule a complete foot exam at least once a year (annually) or more often if you have foot problems. This information is not intended to replace advice given to you by your health care provider. Make sure you discuss any questions you have with your health care provider. Document Revised: 07/31/2019 Document Reviewed: 12/09/2016 Elsevier Patient Education  2020 [employer]  

## 2019-11-29 ENCOUNTER — Other Ambulatory Visit: Payer: Self-pay | Admitting: Internal Medicine

## 2019-11-29 ENCOUNTER — Ambulatory Visit: Payer: PPO | Admitting: Cardiology

## 2019-11-29 MED ORDER — FAMOTIDINE 40 MG PO TABS: ORAL_TABLET | ORAL | 3 refills | Status: DC

## 2019-12-01 NOTE — Progress Notes (Signed)
Subjective: [name] presents today with history of diabetic neuropathy. Patient seen for follow up of chronic, painful mycotic toenails and calluses which interfere with daily activities and routine tasks.  Pain is aggravated when wearing enclosed shoe gear. Pain is getting progressively worse and relieved with periodic professional debridement.   Unk Pinto, MD is her PCP. Last visit was 10/10/2019.  Current Outpatient Medications on File Prior to Visit  Medication Sig Dispense Refill  . Acetaminophen (TYLENOL ARTHRITIS PAIN PO) Take by mouth.    Marland Kitchen aspirin 81 MG tablet Take 81 mg by mouth daily.    Marland Kitchen atorvastatin (LIPITOR) 40 MG tablet Take 1 tablet Daily  for Cholesterol 90 tablet 3  . b complex vitamins tablet Take 1 tablet by mouth daily.    . diclofenac sodium (VOLTAREN) 1 % GEL Apply 4 g topically 4 (four) times daily. 100 g 3  . diclofenac Sodium (VOLTAREN) 1 % GEL APPLY TO AFFECTED AREA 4 TIMES A DAY    . dicyclomine (BENTYL) 20 MG tablet Take 1 tablet (20 mg total) by mouth 3 (three) times daily as needed for spasms. 270 tablet 0  . dorzolamide-timolol (COSOPT) 22.3-6.8 MG/ML ophthalmic solution USE 1 DROP INTO THE AFFECTED EYE 2 TIMES DAILY  1  . furosemide (LASIX) 20 MG tablet Take 1 tablet 2 x /day for BP & Fluid Retention 180 tablet 3  . gabapentin (NEURONTIN) 800 MG tablet Take 1 tablet (800 mg total) by mouth 3 (three) times daily. 90 tablet 3  . levothyroxine (SYNTHROID) 75 MCG tablet Take 1 to 1.5 tablet daily as directed on an empty stomach with only water for 30 minutes & No Antacid meds, Calcium or Magnesium for 4 hrs 114 tablet 3  . Multiple Vitamin (MULTIVITAMIN) tablet Take 1 tablet by mouth daily.    . pantoprazole (PROTONIX) 40 MG tablet Take 1 tablet (40 mg total) by mouth daily. 90 tablet 1  . verapamil (CALAN) 80 MG tablet Take AS needed for palpitations up to 3 x a day 30 tablet 1   No current facility-administered medications on file prior to visit.     Allergies  Allergen Reactions  . Ciprofloxacin   . Macrobid [Nitrofurantoin Macrocrystal]   . Penicillins     REACTION: rash  . Procaine Hcl     REACTION: difficulty breathing  . Sulfonamide Derivatives     REACTION: hives  . Tetanus Toxoid     REACTION: arm swells   Objective: There were no vitals filed for this visit.  Vascular Examination: Capillary refill time to digits <3 seconds b/l.   Dorsalis pedis and posterior tibial pulses present b/l.  No digital hair x 10 digits.  Skin temperature WNL b/l.  Dermatological Examination: Skin with normal turgor, texture and tone b/l.  Toenails 1-5 b/l discolored, thick, dystrophic with subungual debris and pain with palpation to nailbeds due to thickness of nails.  Hyperkeratotic lesion(s) b/l great toes, submet head 5 left, b/l 2nd digits and right 3rd digit. No erythema, no edema, no drainage, no flocculence noted.   Musculoskeletal: Muscle strength 5/5 to all LE muscle groups.  Hammertoes 1-5 b/l.  Neurological: Sensation diminished with 10 gram monofilament.  Assessment: 1. Painful onychomycosis toenails 1-5 b/l 2. Calluses b/l great toes, submet head 5 left 3. Corns b/l 2nd digits and right 3rd digit 4. NIDDM with neuropathy  Plan: 1. Continue diabetic foot care principles.  2. Toenails 1-5 b/l were debrided in length and girth without iatrogenic bleeding. 3.  Calluses pared b/l great toes, submet head 5 left digit utilizing sterile scalpel blade without incident. Dispensed toe tunnels for b/l great toes. 4. Corn(s) b/l 2nd digits and right 3rd digit pared utilizing sterile scalpel blade without incident.  5. Patient to continue soft, supportive shoe gear daily. 6. Patient to report any pedal injuries to medical professional immediately. 7. Follow up 9 weeks. 8. Patient/POA to call should there be a concern in the interim.

## 2019-12-06 ENCOUNTER — Other Ambulatory Visit: Payer: Self-pay

## 2019-12-06 ENCOUNTER — Ambulatory Visit: Payer: PPO | Admitting: Cardiology

## 2019-12-06 ENCOUNTER — Encounter: Payer: Self-pay | Admitting: Cardiology

## 2019-12-06 VITALS — BP 123/63 | HR 48 | Temp 97.3°F | Ht 67.0 in | Wt 227.0 lb

## 2019-12-06 DIAGNOSIS — E782 Mixed hyperlipidemia: Secondary | ICD-10-CM | POA: Diagnosis not present

## 2019-12-06 DIAGNOSIS — R072 Precordial pain: Secondary | ICD-10-CM | POA: Diagnosis not present

## 2019-12-06 DIAGNOSIS — R002 Palpitations: Secondary | ICD-10-CM

## 2019-12-06 DIAGNOSIS — Z7189 Other specified counseling: Secondary | ICD-10-CM | POA: Diagnosis not present

## 2019-12-06 DIAGNOSIS — Z8249 Family history of ischemic heart disease and other diseases of the circulatory system: Secondary | ICD-10-CM | POA: Diagnosis not present

## 2019-12-06 DIAGNOSIS — R600 Localized edema: Secondary | ICD-10-CM

## 2019-12-06 DIAGNOSIS — I10 Essential (primary) hypertension: Secondary | ICD-10-CM | POA: Diagnosis not present

## 2019-12-06 DIAGNOSIS — Z01812 Encounter for preprocedural laboratory examination: Secondary | ICD-10-CM | POA: Diagnosis not present

## 2019-12-06 HISTORY — DX: Palpitations: R00.2

## 2019-12-06 NOTE — Patient Instructions (Signed)
Medication Instructions:  Your Physician recommend you continue on your current medication as directed.    *If you need a refill on your cardiac medications before your next appointment, please call your pharmacy*  Lab Work: Your physician recommends that you return for lab work 1 week prior to procedure (BMP)  If you have labs (blood work) drawn today and your tests are completely normal, you will receive your results only by: Marland Kitchen MyChart Message (if you have MyChart) OR . A paper copy in the mail If you have any lab test that is abnormal or we need to change your treatment, we will call you to review the results.  Testing/Procedures: Cardiac CT Angiography (CTA), is a special type of CT scan that uses a computer to produce multi-dimensional views of major blood vessels throughout the body. In CT angiography, a contrast material is injected through an IV to help visualize the blood vessels [facility]  Follow-Up: At [facility], you and your health needs are our priority.  As part of our continuing mission to provide you with exceptional heart care, we have created designated Provider Care Teams.  These Care Teams include your primary Cardiologist (physician) and Advanced Practice Providers (APPs -  Physician Assistants and Nurse Practitioners) who all work together to provide you with the care you need, when you need it.  Your next appointment:   2 month(s)  The format for your next appointment:   In Person  Provider:   Buford Dresser, MD  Your cardiac CT will be scheduled at one of the below locations:   [facility] [address] [place], [place] [address] [phone]  If scheduled at [facility], please arrive at the [facility] main entrance of [facility] 30-45 minutes prior to test start time. Proceed to the [facility] Radiology Department (first floor) to check-in and test prep.   Please follow these instructions  carefully (unless otherwise directed):  On the Night Before the Test: . Be sure to Drink plenty of water. . Do not consume any caffeinated/decaffeinated beverages or chocolate 12 hours prior to your test. . Do not take any antihistamines 12 hours prior to your test.   On the Day of the Test: . Drink plenty of water. Do not drink any water within one hour of the test. . Do not eat any food 4 hours prior to the test. . You may take your regular medications prior to the test.  . Take metoprolol (Lopressor) two hours prior to test. . HOLD Furosemide/Hydrochlorothiazide morning of the test. . FEMALES- please wear underwire-free bra if available         After the Test: . Drink plenty of water. . After receiving IV contrast, you may experience a mild flushed feeling. This is normal. . On occasion, you may experience a mild rash up to 24 hours after the test. This is not dangerous. If this occurs, you can take Benadryl 25 mg and increase your fluid intake. . If you experience trouble breathing, this can be serious. If it is severe call 911 IMMEDIATELY. If it is mild, please call our office. . If you take any of these medications: Glipizide/Metformin, Avandament, Glucavance, please do not take 48 hours after completing test unless otherwise instructed.   Once we have confirmed authorization from your insurance company, we will call you to set up a date and time for your test.   For non-scheduling related questions, please contact the cardiac imaging nurse  navigator should you have any questions/concerns: Marchia Bond, RN Navigator Cardiac Imaging [facility] Services [phone] Office

## 2019-12-06 NOTE — Progress Notes (Signed)
Cardiology Office Note:    Date:  12/06/2019   ID:  [name], DOB 1946/04/22, MRN [id number]  PCP:  Unk Pinto, MD  Cardiologist:  Buford Dresser, MD  Referring MD: Vicie Mutters, PA-C   CC: new patient evaluation for palpitations and chest pain  History of Present Illness:    [name] is a 74 y.o. female with a hx of type II diabetes, hypertension, hyperlipidemia, obesity, GERD who is seen as a new consult at the request of Vicie Mutters, PA-C for the evaluation and management of palpitations. Also noted to have recent ER visit for chest pain  She was seen in the ER for chest pain on 11/21/19, notes and workup reviewed today. Notes mention intermittent chest pain and palpitations. Woke up 11/17/19 with chest heaviness, no radiation, no associated symptoms. Improved within an hour. Pain recurred intermittently, worse in the middle of the night. No clear aggravating/alleviating factors. No acute ECG changes, normal hsTn without delta.  Chest pain/palpitations: -Initial onset: just started late 10/2019. Had never had before. Initially attributed to indigestion from holiday eating at Christmas -Frequency/Duration: at least 3x/week, lasts about 30 minutes.  -Associated symptoms: sometimes burning, sometimes aching, followed by the palpitations.  -Aggravating/alleviating factors: if she eats very bland, symptoms are not as bad, worse with eating spicy foods.  -Syncope/near syncope: none -Prior cardiac history: no personal history -Prior ECG: sinus, IVCD. -Prior workup: cath in the 90s (no records), reports no disease but did have spasm -Prior treatment: none -Possible medication interactions: taking herbal supplement but no clear triggers. -Caffeine: tea or coffee once/day. -Alcohol: none -Tobacco: smoked for 30 years, quit 2014.  -Comorbidities: type II diabetes (she feels this is borderline, last A1c 5.8, on no meds), hypertension (well controlled on  furosemide only), hyperlipidemia (on aspirin/statin), obesity (BMI 35) -Exercise level: uses a foot pedal for activity -Labs: TSH, kidney function/electrolytes, CBC reviewed. -Cardiac ROS: no shortness of breath, no PND, no orthopnea. Has chronic LE edema intermittently. -Family history: father had MI age 37s and then again age 57, deceased from this. MGF had angina.  Has only taken verapamil once since the ER, none recently. Discussed with her sinus bradycardia I would avoid taking this unless HR >100. She does not think she has ever had one higher than 70.  Past Medical History:  Diagnosis Date  . Anemia   . Chest tightness   . Fatigue   . GERD (gastroesophageal reflux disease)   . Hyperlipidemia   . Hypothyroid   . Internal hemorrhoids 05/04/2009   Qualifier: Diagnosis of  By: Nelson-Smith CMA (AAMA), Dottie    . Myalgia   . Obesity   . Prediabetes   . Ulcer   . Varicose veins     Past Surgical History:  Procedure Laterality Date  . ABDOMINAL HYSTERECTOMY    . CATARACT EXTRACTION    . CYST REMOVAL HAND Left 07/30/2018  . NASAL SINUS SURGERY    . ROTATOR CUFF REPAIR Left more than 7 years ago   Dr. Percell Miller and Dr. Earleen Newport  . TONSILLECTOMY AND ADENOIDECTOMY      Current Medications: Current Outpatient Medications on File Prior to Visit  Medication Sig  . Acetaminophen (TYLENOL ARTHRITIS PAIN PO) Take by mouth.  Marland Kitchen aspirin 81 MG tablet Take 81 mg by mouth daily.  Marland Kitchen atorvastatin (LIPITOR) 40 MG tablet Take 1 tablet Daily  for Cholesterol  . b complex vitamins tablet Take 1 tablet by mouth daily.  . diclofenac sodium (VOLTAREN) 1 %  GEL Apply 4 g topically 4 (four) times daily.  . diclofenac Sodium (VOLTAREN) 1 % GEL APPLY TO AFFECTED AREA 4 TIMES A DAY  . dicyclomine (BENTYL) 20 MG tablet Take 1 tablet (20 mg total) by mouth 3 (three) times daily as needed for spasms.  . dorzolamide-timolol (COSOPT) 22.3-6.8 MG/ML ophthalmic solution USE 1 DROP INTO THE AFFECTED EYE 2 TIMES  DAILY  . famotidine (PEPCID) 40 MG tablet Take 1 tablet at Bedtime for Indigestion & Heartburn  . furosemide (LASIX) 20 MG tablet Take 1 tablet 2 x /day for BP & Fluid Retention  . gabapentin (NEURONTIN) 800 MG tablet Take 1 tablet (800 mg total) by mouth 3 (three) times daily.  Marland Kitchen levothyroxine (SYNTHROID) 75 MCG tablet Take 1 to 1.5 tablet daily as directed on an empty stomach with only water for 30 minutes & No Antacid meds, Calcium or Magnesium for 4 hrs  . Multiple Vitamin (MULTIVITAMIN) tablet Take 1 tablet by mouth daily.  . pantoprazole (PROTONIX) 40 MG tablet Take 1 tablet (40 mg total) by mouth daily.  . verapamil (CALAN) 80 MG tablet Take AS needed for palpitations up to 3 x a day   No current facility-administered medications on file prior to visit.     Allergies:   Ciprofloxacin, Macrobid [nitrofurantoin macrocrystal], Penicillins, Procaine hcl, Sulfonamide derivatives, and Tetanus toxoid   Social History   Tobacco Use  . Smoking status: Former Smoker    Packs/day: 0.50    Years: 46.00    Pack years: 23.00    Types: Cigarettes    Quit date: 05/27/2013    Years since quitting: 6.5  . Smokeless tobacco: Never Used  Substance Use Topics  . Alcohol use: Not Currently  . Drug use: Not Currently    Family History: family history includes Cancer in her maternal grandmother and mother; Coronary artery disease (age of onset: 34) in her father; Dementia in her maternal aunt and mother.  ROS:   Please see the history of present illness.  Additional pertinent ROS: Constitutional: Negative for chills, fever, night sweats, unintentional weight loss  HENT: Negative for ear pain and hearing loss.   Eyes: Negative for loss of vision and eye pain.  Respiratory: Negative for cough, sputum, wheezing.   Cardiovascular: See HPI. Gastrointestinal: Negative for abdominal pain, melena, and hematochezia.  Genitourinary: Negative for dysuria and hematuria.  Musculoskeletal: Negative for  falls and myalgias.  Skin: Negative for itching and rash.  Neurological: Negative for focal weakness, focal sensory changes and loss of consciousness.  Endo/Heme/Allergies: Does not bruise/bleed easily.     EKGs/Labs/Other Studies Reviewed:    The following studies were reviewed today: No prior cardiac studies.  EKG:  EKG is personally reviewed.  The ekg ordered today demonstrates sinus bradycardia at 45bpm, no septal R wave.  Recent Labs: 10/10/2019: ALT 17; Magnesium 2.1; TSH 0.30 11/21/2019: BUN 15; Creatinine, Ser 0.96; Hemoglobin 14.1; Platelets 253; Potassium 3.3; Sodium 141  Recent Lipid Panel    Component Value Date/Time   CHOL 146 10/10/2019 1510   TRIG 159 (H) 10/10/2019 1510   HDL 43 (L) 10/10/2019 1510   CHOLHDL 3.4 10/10/2019 1510   VLDL 38 (H) 02/27/2017 1703   LDLCALC 77 10/10/2019 1510    Physical Exam:    VS:  BP 123/63   Pulse (!) 48   Temp (!) 97.3 F (36.3 C)   Ht 5\' 7"  (1.702 m)   Wt 227 lb (103 kg)   SpO2 96%   BMI 35.55  kg/m     Wt Readings from Last 3 Encounters:  12/06/19 227 lb (103 kg)  11/21/19 224 lb 12.8 oz (102 kg)  10/10/19 230 lb (104.3 kg)    GEN: Well nourished, well developed in no acute distress HEENT: Normal, moist mucous membranes NECK: No JVD CARDIAC: regular rhythm, normal S1 and S2, no rubs or gallops. No murmurs. VASCULAR: Radial and DP pulses 2+ bilaterally. No carotid bruits RESPIRATORY:  Clear to auscultation without rales, wheezing or rhonchi  ABDOMEN: Soft, non-tender, non-distended MUSCULOSKELETAL:  Ambulates independently SKIN: Warm and dry, mild bilateral nonpitting edema NEUROLOGIC:  Alert and oriented x 3. No focal neuro deficits noted. PSYCHIATRIC:  Normal affect    ASSESSMENT:    1. Precordial pain   2. Pre-procedure lab exam   3. Heart palpitations   4. Family history of heart disease   5. Cardiac risk counseling   6. Counseling on health promotion and disease prevention   7. Bilateral leg edema     8. Essential hypertension   9. Mixed hyperlipidemia    PLAN:    Chest pain, precordial: has significant CV risk factors, including a family history in her father. New in the last month --discussed treadmill stress, nuclear stress/lexiscan, and CT coronary angiography. Discussed pros and cons of each, including but not limited to false positive/false negative risk, radiation risk, and risk of IV contrast dye. Based on shared decision making, decision was made to pursue CT coronary angiography. -resting sinus bradycardia, no beta blocker -counseled on need to get BMET prior to test -counseled on use of sublingual nitroglycerin and its importance to a good test  Palpitations: comes on after her chest discomfort, always linked. New in the last month -if CT unrevealing, would get 14 day Zio for further evaluation  Bilateral leg edema: discussed compression stockings today  Hypertension: only medication is furosemide. Well controlled today  Hyperlipidemia: on aspirin and atorvastatin. LDL 77, TG 159  Cardiac risk counseling and prevention recommendations: -recommend heart healthy/Mediterranean diet, with whole grains, fruits, vegetable, fish, lean meats, nuts, and olive oil. Limit salt. -recommend moderate walking, 3-5 times/week for 30-50 minutes each session. Aim for at least 150 minutes.week. Goal should be pace of 3 miles/hours, or walking 1.5 miles in 30 minutes -recommend avoidance of tobacco products. Avoid excess alcohol. -Additional risk factor control:  -Diabetes risk: A1c is 5.8, no meds.   -Lipids: as above  -Blood pressure control: as above  -Weight: BMI 35, would benefit form weight loss -ASCVD risk score: The 10-year ASCVD risk score Mikey Bussing DC Brooke Bonito., et al., 2013) is: 22.7%   Values used to calculate the score:     Age: 54 years     Sex: Female     Is Non-Hispanic African American: No     Diabetic: Yes     Tobacco smoker: No     Systolic Blood Pressure: [id number]     Is  BP treated: Yes     HDL Cholesterol: 43 mg/dL     Total Cholesterol: 146 mg/dL    Plan for follow up: 2 mos or sooner based on testing Total time of encounter: 70 minutes total time of encounter, including 50 minutes spent in face-to-face patient care. This time includes coordination of care and counseling regarding chest pain, palpitations, testing options. Remainder of non-face-to-face time involved reviewing chart documents/testing relevant to the patient encounter and documentation in the medical record.  Buford Dresser, MD, PhD [place]  [facility]   Medication Adjustments/Labs  and Tests Ordered: Current medicines are reviewed at length with the patient today.  Concerns regarding medicines are outlined above.  Orders Placed This Encounter  Procedures  . CT CORONARY MORPH W/CTA COR W/SCORE W/CA W/CM &/OR WO/CM  . CT CORONARY FRACTIONAL FLOW RESERVE DATA PREP  . CT CORONARY FRACTIONAL FLOW RESERVE FLUID ANALYSIS  . Basic metabolic panel  . EKG 12-Lead   No orders of the defined types were placed in this encounter.   Patient Instructions  Medication Instructions:  Your Physician recommend you continue on your current medication as directed.    *If you need a refill on your cardiac medications before your next appointment, please call your pharmacy*  Lab Work: Your physician recommends that you return for lab work 1 week prior to procedure (BMP)  If you have labs (blood work) drawn today and your tests are completely normal, you will receive your results only by: Marland Kitchen MyChart Message (if you have MyChart) OR . A paper copy in the mail If you have any lab test that is abnormal or we need to change your treatment, we will call you to review the results.  Testing/Procedures: Cardiac CT Angiography (CTA), is a special type of CT scan that uses a computer to produce multi-dimensional views of major blood vessels throughout the body. In CT angiography, a contrast  material is injected through an IV to help visualize the blood vessels [facility]  Follow-Up: At [facility], you and your health needs are our priority.  As part of our continuing mission to provide you with exceptional heart care, we have created designated Provider Care Teams.  These Care Teams include your primary Cardiologist (physician) and Advanced Practice Providers (APPs -  Physician Assistants and Nurse Practitioners) who all work together to provide you with the care you need, when you need it.  Your next appointment:   2 month(s)  The format for your next appointment:   In Person  Provider:   Buford Dresser, MD  Your cardiac CT will be scheduled at one of the below locations:   [facility] [address] [place], [place] [address] [phone]  If scheduled at [facility], please arrive at the [facility] main entrance of [facility] 30-45 minutes prior to test start time. Proceed to the [facility] Radiology Department (first floor) to check-in and test prep.   Please follow these instructions carefully (unless otherwise directed):  On the Night Before the Test: . Be sure to Drink plenty of water. . Do not consume any caffeinated/decaffeinated beverages or chocolate 12 hours prior to your test. . Do not take any antihistamines 12 hours prior to your test.   On the Day of the Test: . Drink plenty of water. Do not drink any water within one hour of the test. . Do not eat any food 4 hours prior to the test. . You may take your regular medications prior to the test.  . Take metoprolol (Lopressor) two hours prior to test. . HOLD Furosemide/Hydrochlorothiazide morning of the test. . FEMALES- please wear underwire-free bra if available         After the Test: . Drink plenty of water. . After receiving IV contrast, you may experience a mild flushed feeling. This is normal. . On occasion, you may experience a mild  rash up to 24 hours after the test. This is not dangerous. If this occurs, you can take Benadryl 25 mg and increase your fluid intake. Marland Kitchen  If you experience trouble breathing, this can be serious. If it is severe call 911 IMMEDIATELY. If it is mild, please call our office. . If you take any of these medications: Glipizide/Metformin, Avandament, Glucavance, please do not take 48 hours after completing test unless otherwise instructed.   Once we have confirmed authorization from your insurance company, we will call you to set up a date and time for your test.   For non-scheduling related questions, please contact the cardiac imaging nurse navigator should you have any questions/concerns: Marchia Bond, RN Navigator Cardiac Imaging [facility] [phone] Office      Signed, Buford Dresser, MD PhD 12/06/2019 12:42 PM    [place]

## 2019-12-19 ENCOUNTER — Other Ambulatory Visit: Payer: Self-pay | Admitting: Physician Assistant

## 2019-12-19 DIAGNOSIS — R002 Palpitations: Secondary | ICD-10-CM

## 2019-12-25 ENCOUNTER — Encounter (HOSPITAL_COMMUNITY): Payer: Self-pay

## 2019-12-25 ENCOUNTER — Telehealth (HOSPITAL_COMMUNITY): Payer: Self-pay | Admitting: Emergency Medicine

## 2019-12-25 NOTE — Telephone Encounter (Signed)
Reaching out to patient to offer assistance regarding upcoming cardiac imaging study; pt verbalizes understanding of appt date/time, parking situation and where to check in, pre-test NPO status and medications ordered, and verified current allergies; name and call back number provided for further questions should they arise Davie Sagona RN Navigator Cardiac Imaging [place] [facility] [phone] office [phone] cell 

## 2019-12-26 ENCOUNTER — Other Ambulatory Visit: Payer: Self-pay

## 2019-12-26 ENCOUNTER — Ambulatory Visit
Admission: RE | Admit: 2019-12-26 | Discharge: 2019-12-26 | Disposition: A | Discharge status: Home / Self Care | Payer: PPO | Source: Ambulatory Visit | Attending: Cardiology | Admitting: Cardiology

## 2019-12-26 DIAGNOSIS — R072 Precordial pain: Secondary | ICD-10-CM | POA: Diagnosis not present

## 2019-12-26 DIAGNOSIS — R918 Other nonspecific abnormal finding of lung field: Secondary | ICD-10-CM | POA: Insufficient documentation

## 2019-12-26 DIAGNOSIS — I2510 Atherosclerotic heart disease of native coronary artery without angina pectoris: Secondary | ICD-10-CM | POA: Insufficient documentation

## 2019-12-26 MED ORDER — IOHEXOL 350 MG/ML SOLN
125.0000 mL | Freq: Once | INTRAVENOUS | Status: AC | PRN
  Administered 2019-12-26: 12:00:00 125 mL via INTRAVENOUS

## 2019-12-26 MED ORDER — NITROGLYCERIN 0.4 MG SL SUBL
0.8000 mg | SUBLINGUAL_TABLET | Freq: Once | SUBLINGUAL | Status: AC
  Administered 2019-12-26: 12:00:00 0.8 mg via SUBLINGUAL

## 2019-12-26 MED ORDER — METOPROLOL TARTRATE 5 MG/5ML IV SOLN
5.0000 mg | INTRAVENOUS | Status: DC | PRN
  Administered 2019-12-26: 5 mg via INTRAVENOUS

## 2019-12-26 NOTE — Progress Notes (Signed)
Patient tolerated CT well. Drank a coke after. Ambulatory steady gait to exit.

## 2019-12-27 DIAGNOSIS — R072 Precordial pain: Secondary | ICD-10-CM | POA: Diagnosis not present

## 2020-01-01 DIAGNOSIS — H40013 Open angle with borderline findings, low risk, bilateral: Secondary | ICD-10-CM | POA: Diagnosis not present

## 2020-01-03 ENCOUNTER — Ambulatory Visit: Payer: PPO | Admitting: Cardiology

## 2020-01-03 ENCOUNTER — Encounter: Payer: Self-pay | Admitting: Cardiology

## 2020-01-03 ENCOUNTER — Other Ambulatory Visit: Payer: Self-pay

## 2020-01-03 VITALS — BP 126/67 | HR 67 | Ht 67.0 in | Wt 223.2 lb

## 2020-01-03 DIAGNOSIS — I2584 Coronary atherosclerosis due to calcified coronary lesion: Secondary | ICD-10-CM

## 2020-01-03 DIAGNOSIS — R002 Palpitations: Secondary | ICD-10-CM

## 2020-01-03 DIAGNOSIS — Z8249 Family history of ischemic heart disease and other diseases of the circulatory system: Secondary | ICD-10-CM | POA: Diagnosis not present

## 2020-01-03 DIAGNOSIS — Z712 Person consulting for explanation of examination or test findings: Secondary | ICD-10-CM

## 2020-01-03 DIAGNOSIS — Z7189 Other specified counseling: Secondary | ICD-10-CM

## 2020-01-03 DIAGNOSIS — I2510 Atherosclerotic heart disease of native coronary artery without angina pectoris: Secondary | ICD-10-CM | POA: Diagnosis not present

## 2020-01-03 DIAGNOSIS — E782 Mixed hyperlipidemia: Secondary | ICD-10-CM | POA: Diagnosis not present

## 2020-01-03 DIAGNOSIS — I10 Essential (primary) hypertension: Secondary | ICD-10-CM | POA: Diagnosis not present

## 2020-01-03 MED ORDER — NITROGLYCERIN 0.4 MG SL SUBL: 0.4000 mg | SUBLINGUAL_TABLET | SUBLINGUAL | 3 refills | Status: DC | PRN

## 2020-01-03 NOTE — Patient Instructions (Signed)

## 2020-01-03 NOTE — Progress Notes (Signed)
Cardiology Office Note:    Date:  01/03/2020   ID:  [name], DOB 02-26-1946, MRN [id number]  PCP:  Unk Pinto, MD  Cardiologist:  Buford Dresser, MD  Referring MD: Unk Pinto, MD   CC: follow up  History of Present Illness:    [name] is a 74 y.o. female with a hx of type II diabetes, hypertension, hyperlipidemia, obesity, GERD who is seen for follow up today. She was initially seen 12/06/19 as a new consult at the request of Unk Pinto, MD for the evaluation and management of palpitations. Also noted to have recent ER visit for chest pain  Cardiac history: seen in the ER for chest pain on 11/21/19. Woke up 11/17/19 with chest heaviness, no radiation, no associated symptoms. Improved within an hour. Pain recurred intermittently, worse in the middle of the night. No clear aggravating/alleviating factors. No acute ECG changes, normal hsTn without delta.  Today: Here to discuss results of CT. Reviewed at length today. Discussed that this is consistent with three vessel CAD, though not requiring intervention. We reviewed the images together today.  We discussed secondary prevention today. She is already on aspirin and high intensity atorvastatin. We discussed symptoms that need immediate medical attention. Will fill nitro prescription today. Discussed lifestyle recommendations as well, see below.   Denies recent chest pain, shortness of breath at rest or with normal exertion. No PND, orthopnea, LE edema or unexpected weight gain. No syncope, rare palpitations.  Past Medical History:  Diagnosis Date  . Anemia   . Chest tightness   . Fatigue   . GERD (gastroesophageal reflux disease)   . Hyperlipidemia   . Hypothyroid   . Internal hemorrhoids 05/04/2009   Qualifier: Diagnosis of  By: Nelson-Smith CMA (AAMA), Dottie    . Myalgia   . Obesity   . Prediabetes   . Ulcer   . Varicose veins     Past Surgical History:  Procedure Laterality Date  .  ABDOMINAL HYSTERECTOMY    . CATARACT EXTRACTION    . CYST REMOVAL HAND Left 07/30/2018  . NASAL SINUS SURGERY    . ROTATOR CUFF REPAIR Left more than 7 years ago   Dr. Percell Miller and Dr. Earleen Newport  . TONSILLECTOMY AND ADENOIDECTOMY      Current Medications: Current Outpatient Medications on File Prior to Visit  Medication Sig  . Acetaminophen (TYLENOL ARTHRITIS PAIN PO) Take by mouth.  Marland Kitchen aspirin 81 MG tablet Take 81 mg by mouth daily.  Marland Kitchen atorvastatin (LIPITOR) 40 MG tablet Take 1 tablet Daily  for Cholesterol  . b complex vitamins tablet Take 1 tablet by mouth daily.  . diclofenac sodium (VOLTAREN) 1 % GEL Apply 4 g topically 4 (four) times daily.  Marland Kitchen dicyclomine (BENTYL) 20 MG tablet Take 1 tablet (20 mg total) by mouth 3 (three) times daily as needed for spasms.  . dorzolamide-timolol (COSOPT) 22.3-6.8 MG/ML ophthalmic solution USE 1 DROP INTO THE AFFECTED EYE 2 TIMES DAILY  . famotidine (PEPCID) 40 MG tablet Take 1 tablet at Bedtime for Indigestion & Heartburn  . furosemide (LASIX) 20 MG tablet Take 1 tablet 2 x /day for BP & Fluid Retention  . gabapentin (NEURONTIN) 800 MG tablet Take 1 tablet (800 mg total) by mouth 3 (three) times daily.  . Glucosamine-Chondroitin 1500-1200 MG/30ML LIQD Take by mouth.  . levothyroxine (SYNTHROID) 75 MCG tablet Take 1 to 1.5 tablet daily as directed on an empty stomach with only water for 30 minutes & No Antacid  meds, Calcium or Magnesium for 4 hrs  . Multiple Vitamin (MULTIVITAMIN) tablet Take 1 tablet by mouth daily.  . pantoprazole (PROTONIX) 40 MG tablet Take 1 tablet (40 mg total) by mouth daily.  . Probiotic CAPS Take by mouth.  . verapamil (CALAN) 80 MG tablet TAKE AS NEEDED FOR PALPITATIONS UP TO 3 X A DAY   No current facility-administered medications on file prior to visit.     Allergies:   Ciprofloxacin, Macrobid [nitrofurantoin macrocrystal], Penicillins, Procaine hcl, Sulfonamide derivatives, and Tetanus toxoid   Social History   Tobacco  Use  . Smoking status: Former Smoker    Packs/day: 0.50    Years: 46.00    Pack years: 23.00    Types: Cigarettes    Quit date: 05/27/2013    Years since quitting: 6.6  . Smokeless tobacco: Never Used  Substance Use Topics  . Alcohol use: Not Currently  . Drug use: Not Currently    Family History: family history includes Cancer in her maternal grandmother and mother; Coronary artery disease (age of onset: 72) in her father; Dementia in her maternal aunt and mother.  ROS:   Please see the history of present illness.  Additional pertinent ROS negative except as noted in HPI.   EKGs/Labs/Other Studies Reviewed:    The following studies were reviewed today: CT cardiac 12/27/19 Aorta: Normal size. Ascending and descending aortic wall calcifications present. No dissection.  Aortic Valve:  Trileaflet.  No calcifications.  Coronary Arteries:  Normal coronary origin.  Right dominance.  RCA is a large dominant artery that gives rise to PDA and PLA. There is calcified plaque in the proximal segment causing mild non obstructive stenosis (25-49%). There is calcified plaque in the distal RCA causing moderate (50-69%) stenosis.  Left main is a large artery that gives rise to LAD and LCX arteries.  LAD is a large vessel that has calcified plaque in its proximal segment causing moderate (50-69%) stenosis.  LCX is a non-dominant artery that gives rise to an OM1 branch. There is calcified plaque the the proximal LCx causing moderate (50%) stenosis.  Other findings:  Normal pulmonary vein drainage into the left atrium.  Normal left atrial appendage without a thrombus.  Normal size of the pulmonary artery.  IMPRESSION: 1. Coronary calcium score of 693. This was 78th percentile for age and sex matched control.  2. Normal coronary origin with right dominance.  3. Calcified plaque causing moderate stenosis in the prox LAD, distal RCA, proximal LCx.  4.CAD-RADS 3.  Moderate stenosis. Consider symptom-guided anti-ischemic pharmacotherapy as well as risk factor modification per guideline directed care. Additional analysis with CT FFR will be submitted and reported separately.  FFR 1. Left Main:  No significant stenosis. 2. LAD: No significant stenosis.  FFR 0.88 3. LCX: No significant stenosis.  FFR 0.9 4. RCA: No significant stenosis.  FFR 0.91  IMPRESSION: 1.  CT FFR analysis didn't show any significant stenosis.  EKG:  EKG is personally reviewed.  The ekg ordered 12/09/19 demonstrates sinus bradycardia at 45bpm, no septal R wave.  Recent Labs: 10/10/2019: ALT 17; Magnesium 2.1; TSH 0.30 11/21/2019: BUN 15; Creatinine, Ser 0.96; Hemoglobin 14.1; Platelets 253; Potassium 3.3; Sodium 141  Recent Lipid Panel    Component Value Date/Time   CHOL 146 10/10/2019 1510   TRIG 159 (H) 10/10/2019 1510   HDL 43 (L) 10/10/2019 1510   CHOLHDL 3.4 10/10/2019 1510   VLDL 38 (H) 02/27/2017 1703   LDLCALC 77 10/10/2019 1510  Physical Exam:    VS:  BP 126/67   Pulse 67   Ht 5\' 7"  (1.702 m)   Wt 223 lb 3.2 oz (101.2 kg)   SpO2 97%   BMI 34.96 kg/m     Wt Readings from Last 3 Encounters:  01/03/20 223 lb 3.2 oz (101.2 kg)  12/06/19 227 lb (103 kg)  11/21/19 224 lb 12.8 oz (102 kg)    GEN: Well nourished, well developed in no acute distress HEENT: Normal, moist mucous membranes NECK: No JVD CARDIAC: regular rhythm, normal S1 and S2, no rubs or gallops. No murmur. VASCULAR: Radial and DP pulses 2+ bilaterally. No carotid bruits RESPIRATORY:  Clear to auscultation without rales, wheezing or rhonchi  ABDOMEN: Soft, non-tender, non-distended MUSCULOSKELETAL:  Ambulates independently SKIN: Warm and dry, bilateral nonpitting edema NEUROLOGIC:  Alert and oriented x 3. No focal neuro deficits noted. PSYCHIATRIC:  Normal affect   ASSESSMENT:    1. Coronary artery disease due to calcified coronary lesion   2. Heart palpitations   3. Family  history of heart disease   4. Encounter to discuss test results   5. Cardiac risk counseling   6. Counseling on health promotion and disease prevention   7. Essential hypertension   8. Mixed hyperlipidemia    PLAN:    Chest pain, precordial: chest pain now improved, but CT shows 3 vessel coronary artery disease without significant stenosis -discussed secondary prevention at length -has strong family history, especially in her father. Discussed this family history in the setting of her test results -continue aspirin and high intensity atorvastatin 40 mg -will send prescription for sublingual nitroglycerin, instructed on use -discussed red flag symptoms, when to seek emergency medical care  Palpitations: rare -consider monitor in the future if they worsen  Bilateral leg edema: stable -compression stockings previously discussed  Hypertension:goal <130/80 -at goal today -continue furosemide  Hyperlipidemia: on aspirin and atorvastatin. LDL 77, TG 159 -aim for LDL <70 given CAD -counseled on lifestyle, below -recheck at follow up  Cardiac risk counseling and prevention recommendations: -recommend heart healthy/Mediterranean diet, with whole grains, fruits, vegetable, fish, lean meats, nuts, and olive oil. Limit salt. -recommend moderate walking, 3-5 times/week for 30-50 minutes each session. Aim for at least 150 minutes.week. Goal should be pace of 3 miles/hours, or walking 1.5 miles in 30 minutes -recommend avoidance of tobacco products. Avoid excess alcohol. -Additional risk factor control:  -Diabetes risk: A1c is 5.8, no meds.   -Lipids: as above  -Blood pressure control: as above  -Weight: BMI 34.96, would benefit form weight loss  Plan for follow up: 6 mos or sooner PRN  Buford Dresser, MD, PhD [place]  [facility]   Medication Adjustments/Labs and Tests Ordered: Current medicines are reviewed at length with the patient today.  Concerns regarding  medicines are outlined above.  No orders of the defined types were placed in this encounter.  Meds ordered this encounter  Medications  . nitroGLYCERIN (NITROSTAT) 0.4 MG SL tablet    Sig: Place 1 tablet (0.4 mg total) under the tongue every 5 (five) minutes as needed for chest pain.    Dispense:  90 tablet    Refill:  3    Patient Instructions  Medication Instructions:  Your Physician recommend you continue on your current medication as directed.    *If you need a refill on your cardiac medications before your next appointment, please call your pharmacy*  Lab Work: None  Testing/Procedures: None  Follow-Up: At [facility],  you and your health needs are our priority.  As part of our continuing mission to provide you with exceptional heart care, we have created designated Provider Care Teams.  These Care Teams include your primary Cardiologist (physician) and Advanced Practice Providers (APPs -  Physician Assistants and Nurse Practitioners) who all work together to provide you with the care you need, when you need it.  Your next appointment:   6 month(s)  The format for your next appointment:   In Person  Provider:   Buford Dresser, MD      Signed, Buford Dresser, MD PhD 01/03/2020 6:50 PM    [place]

## 2020-01-06 ENCOUNTER — Telehealth: Payer: Self-pay | Admitting: Cardiology

## 2020-01-06 NOTE — Telephone Encounter (Signed)
New Message     Pt c/o medication issue:  1. Name of Medication: nitroGLYCERIN (NITROSTAT) 0.4 MG SL tablet  2. How are you currently taking this medication (dosage and times per day)?   3. Are you having a reaction (difficulty breathing--STAT)? N/A  4. What is your medication issue? Written as a quantity of 90 tablets, they only dispense in quantity of 25 tablets.  [name] is wondering if the Quantity needs to be 75 or 100. And if there will be any refills.     Please advise

## 2020-01-06 NOTE — Telephone Encounter (Signed)
Spoke with Arbie Cookey at [facility]. [facility] will dispense 25 tablets with 3 refills on her nitroglycerin. If patient requires more nitroglycerin they can issue an override and send more to patient.

## 2020-01-14 ENCOUNTER — Ambulatory Visit (HOSPITAL_COMMUNITY): Payer: PPO

## 2020-01-21 ENCOUNTER — Other Ambulatory Visit: Payer: Self-pay

## 2020-01-21 MED ORDER — GABAPENTIN 800 MG PO TABS: 800.0000 mg | ORAL_TABLET | Freq: Three times a day (TID) | ORAL | 3 refills | Status: DC

## 2020-01-22 DIAGNOSIS — I2510 Atherosclerotic heart disease of native coronary artery without angina pectoris: Secondary | ICD-10-CM | POA: Insufficient documentation

## 2020-01-22 NOTE — Progress Notes (Signed)
WELLNESS VISIT  Assessment and Plan:  wellness 1 year  Controlled type 2 diabetes mellitus with complication, without long-term current use of insulin (HCC) -     Hemoglobin A1c ([facility]) Discussed general issues about diabetes pathophysiology and management., Educational material distributed., Suggested low cholesterol diet., Encouraged aerobic exercise., Discussed foot care., Reminded to get yearly retinal exam.  Type 2 diabetes mellitus with hyperlipidemia (HCC) -     Hemoglobin A1c ([facility]) check lipids decrease fatty foods increase activity.   CKD stage 3 secondary to diabetes (HCC) -     Hemoglobin A1c ([facility]) Increase fluids, avoid NSAIDS, monitor sugars, will monitor  Atherosclerosis of aorta (HCC) Control blood pressure, cholesterol, glucose, increase exercise.   Class 2 severe obesity due to excess calories with serious comorbidity and body mass index (BMI) of 37.0 to 37.9 in adult [facility]) - follow up 3 months for progress monitoring - increase veggies, decrease carbs - long discussion about weight loss, diet, and exercise  Labile hypertension -     CBC with Diff -     COMPLETE METABOLIC PANEL WITH GFR -     Urinalysis, Routine w reflex microscopic -     Microalbumin / Creatinine Urine Ratio -     EKG 12-Lead -     DG Chest 2 View; Future - continue medications, DASH diet, exercise and monitor at home. Call if greater than 130/80.   Hypothyroidism, unspecified type -     TSH Hypothyroidism-check TSH level, continue medications the same, reminded to take on an empty stomach 30-26mins before food.   Hyperlipidemia, mixed -     Lipid Profile check lipids decrease fatty foods increase activity.   Medication management -     Magnesium  Vitamin D deficiency -     Vitamin D (25 hydroxy)  Anemia, unspecified type - monitor, continue iron supp with Vitamin C and increase green leafy veggies  History of tobacco use Monitor, get cxr  Chronic venous  insufficiency - weight loss discussed, continue compression stockings and elevation  History of basal cell carcinoma (BCC) Follow up derm  Gastroesophageal reflux disease with esophagitis without hemorrhage Continue PPI/H2 blocker, diet discussed  Diverticulosis of large intestine without hemorrhage  Peripheral autonomic neuropathy of unknown cause  Primary osteoarthritis of knee, unspecified laterality   Continue diet and meds as discussed. Further disposition pending results of labs. Over 30 minutes of exam, counseling, chart review, and critical decision making was performed  Future Appointments  Date Time Provider [place]  01/31/2020  2:00 PM Marzetta Board, DPM [facility]  05/07/2020  2:30 PM Unk Pinto, MD [facility] None  10/12/2020  2:00 PM Vicie Mutters, PA-C [facility] None  01/27/2021  2:00 PM Vicie Mutters, PA-C [facility] None    HPI 74 y.o. female  presents for medicare wellness and 3 month follow up on hypertension, cholesterol, prediabetes, and vitamin D deficiency.   Went to Er in nov for abnormal EKG- was lead placement. She did have CTA and has mild to moderate plaque.   She has seen Dr. Milinda Pointer for infection/ulcer of left second toe in the past, checks feet daily, no ulcers   Has great grand son, gets to help watch him some, grand daughter seperated from husband, he is 6 months. Her granddaughter had COVID, she never got it. Son in law had brain surgery in Jan, he is doing well, going to start new job.   BMI is Body mass index is 35.24 kg/m., she is working on diet  and exercise. Wt Readings from Last 3 Encounters:  01/23/20 225 lb (102.1 kg)  01/03/20 223 lb 3.2 oz (101.2 kg)  12/06/19 227 lb (103 kg)   Her blood pressure has been controlled at home, has been good, today their BP is BP: 122/82  She does workout. She denies chest pain, shortness of breath, dizziness.  She has been working on diet and exercise for Diabetes   with diabetic chronic kidney disease she is on ACE/ARB With hyperlipidemia NOT at goal less than 70, she is on lipitor 40 mg Coronary calcium score of 600- mild-moderate plaque with CTA- follows with Dr Shawna Orleans cardiology.  she is on bASA she has idiopathic paraesthsias and is on lyrica/elavil- follows with Dr. Adah Perl every 90 days  Eye exam Dr. Nicki Reaper denies polydipsia, polyuria and visual disturbances.  Last A1C was:  Lab Results  Component Value Date   HGBA1C 5.9 (H) 01/23/2020   Lab Results  Component Value Date   GFRNONAA 61 01/23/2020   Lab Results  Component Value Date   CHOL 158 01/23/2020   HDL 39 (L) 01/23/2020   LDLCALC 90 01/23/2020   TRIG 203 (H) 01/23/2020   CHOLHDL 4.1 01/23/2020   Patient is on Vitamin D supplement.   Lab Results  Component Value Date   VD25OH 50 10/10/2019     She is on thyroid medication. Her medication was not changed last visit.   Lab Results  Component Value Date   TSH 0.85 01/23/2020  .   Current Medications:   Current Outpatient Medications (Endocrine & Metabolic):  .  levothyroxine (SYNTHROID) 75 MCG tablet, Take 1 to 1.5 tablet daily as directed on an empty stomach with only water for 30 minutes & No Antacid meds, Calcium or Magnesium for 4 hrs  Current Outpatient Medications (Cardiovascular):  .  atorvastatin (LIPITOR) 40 MG tablet, Take 1 tablet Daily  for Cholesterol .  furosemide (LASIX) 20 MG tablet, Take 1 tablet 2 x /day for BP & Fluid Retention .  nitroGLYCERIN (NITROSTAT) 0.4 MG SL tablet, Place 1 tablet (0.4 mg total) under the tongue every 5 (five) minutes as needed for chest pain. .  verapamil (CALAN) 80 MG tablet, TAKE AS NEEDED FOR PALPITATIONS UP TO 3 X A DAY   Current Outpatient Medications (Analgesics):  Marland Kitchen  Acetaminophen (TYLENOL ARTHRITIS PAIN PO), Take by mouth. Marland Kitchen  aspirin 81 MG tablet, Take 81 mg by mouth daily.   Current Outpatient Medications (Other):  .  b complex vitamins tablet, Take 1 tablet by  mouth daily. .  diclofenac sodium (VOLTAREN) 1 % GEL, Apply 4 g topically 4 (four) times daily. Marland Kitchen  dicyclomine (BENTYL) 20 MG tablet, Take 1 tablet (20 mg total) by mouth 3 (three) times daily as needed for spasms. .  dorzolamide-timolol (COSOPT) 22.3-6.8 MG/ML ophthalmic solution, USE 1 DROP INTO THE AFFECTED EYE 2 TIMES DAILY .  famotidine (PEPCID) 40 MG tablet, Take 1 tablet at Bedtime for Indigestion & Heartburn .  gabapentin (NEURONTIN) 800 MG tablet, Take 1 tablet (800 mg total) by mouth 3 (three) times daily. .  Glucosamine-Chondroitin 1500-1200 MG/30ML LIQD, Take by mouth. .  Multiple Vitamin (MULTIVITAMIN) tablet, Take 1 tablet by mouth daily. .  pantoprazole (PROTONIX) 40 MG tablet, Take 1 tablet (40 mg total) by mouth daily. .  Probiotic CAPS, Take by mouth.  Medical History:  Past Medical History:  Diagnosis Date  . Anemia   . Chest tightness   . Fatigue   . GERD (gastroesophageal reflux  disease)   . Hyperlipidemia   . Hypothyroid   . Internal hemorrhoids 05/04/2009   Qualifier: Diagnosis of  By: Nelson-Smith CMA (AAMA), Dottie    . Myalgia   . Obesity   . Prediabetes   . Ulcer   . Varicose veins    Health Maintenance:   Immunization History  Administered Date(s) Administered  . Influenza, High Dose Seasonal PF 08/19/2014, 09/10/2015, 07/26/2016, 08/10/2017, 08/14/2018, 08/27/2019  . Pneumococcal Conjugate-13 05/12/2014  . Pneumococcal Polysaccharide-23 09/05/2007, 06/02/2015   Preventative care: Last colonoscopy: 2011, Due 2021 EGD: n/a CT AB 2011 CXR 2013 Heart cath June 1999, no blockages, coronary spasms Last mammogram: 10/2019 Last pap smear/pelvic exam: 02/2011- declines another DEXA:07/2012, normal CTA 12/2019 IMPRESSION: 1. Coronary calcium score of 693. This was 78th percentile for age and sex matched control.  2. Normal coronary origin with right dominance.  3. Calcified plaque causing moderate stenosis in the prox LAD, distal RCA, proximal  LCx.  4.CAD-RADS 3. Moderate stenosis. Consider symptom-guided anti-ischemic pharmacotherapy as well as risk factor modification per guideline directed care. Additional analysis with CT FFR will be submitted and reported separately.   Prior vaccinations: TD or Tdap: ALLERGY- just swollen arm  Influenza: 2020 Pneumococcal: 2016 Prevnar13: 2015  Shingrix 45 dollars a shot  [facility]  Dr. Nicki Reaper for eye care, checking for glaucoma, 2021 Patient Care Team: Unk Pinto, MD as PCP - General (Internal Medicine) Buford Dresser, MD as PCP - Cardiology (Cardiology) Macarthur Critchley, [place] as Referring Physician (Optometry) Vicie Mutters, PA-C as Referring Physician (Physician Assistant)  Allergies Allergies  Allergen Reactions  . Ciprofloxacin   . Macrobid [Nitrofurantoin Macrocrystal]   . Penicillins     REACTION: rash  . Procaine Hcl     REACTION: difficulty breathing  . Sulfonamide Derivatives     REACTION: hives  . Tetanus Toxoid     REACTION: arm swells    SURGICAL HISTORY She  has a past surgical history that includes Abdominal hysterectomy; Tonsillectomy and adenoidectomy; Cataract extraction; Nasal sinus surgery; Rotator cuff repair (Left, more than 7 years ago); and Cyst removal hand (Left, 07/30/2018). FAMILY HISTORY Her family history includes Cancer in her maternal grandmother and mother; Coronary artery disease (age of onset: 34) in her father; Dementia in her maternal aunt and mother. SOCIAL HISTORY She  reports that she quit smoking about 6 years ago. Her smoking use included cigarettes. She has a 23.00 pack-year smoking history. She has never used smokeless tobacco. She reports previous alcohol use. She reports previous drug use.  MEDICARE WELLNESS OBJECTIVES: Physical activity: Current Exercise Habits: The patient does not participate in regular exercise at present Cardiac risk factors: Cardiac Risk Factors include: advanced age  (>37men, >33 women);diabetes mellitus;hypertension;sedentary lifestyle;obesity (BMI >30kg/m2);dyslipidemia Depression/mood screen:   Depression screen [facility] 2/9 01/27/2020  Decreased Interest 0  Down, Depressed, Hopeless 0  PHQ - 2 Score 0    ADLs:  In your present state of health, do you have any difficulty performing the following activities: 01/27/2020 04/02/2019  Hearing? N N  Vision? N N  Difficulty concentrating or making decisions? N N  Walking or climbing stairs? N N  Dressing or bathing? N N  Doing errands, shopping? N N  Preparing Food and eating ? - N  Using the Toilet? - N  In the past six months, have you accidently leaked urine? - N  Do you have problems with loss of bowel control? - N  Managing your Medications? - N  Managing  your Finances? - N  Housekeeping or managing your Housekeeping? - N  Some recent data might be hidden     Cognitive Testing  Alert? Yes  Normal Appearance?Yes  Oriented to person? Yes  Place? Yes   Time? Yes  Recall of three objects?  Yes  Can perform simple calculations? Yes  Displays appropriate judgment?Yes  Can read the correct time from a watch face?Yes  EOL planning: Does Patient Have a Medical Advance Directive?: Yes     Review of Systems:  Review of Systems  Constitutional: Negative.   HENT: Negative.   Eyes: Negative.   Respiratory: Negative.   Cardiovascular: Negative.   Gastrointestinal: Negative.   Genitourinary: Negative.   Musculoskeletal: Negative.   Skin: Negative.   Neurological: Negative.   Endo/Heme/Allergies: Negative.   Psychiatric/Behavioral: Negative.     Physical Exam: BP 122/82   Pulse 66   Temp 97.7 F (36.5 C)   Ht 5\' 7"  (1.702 m)   Wt 225 lb (102.1 kg)   SpO2 96%   BMI 35.24 kg/m  Wt Readings from Last 3 Encounters:  01/23/20 225 lb (102.1 kg)  01/03/20 223 lb 3.2 oz (101.2 kg)  12/06/19 227 lb (103 kg)   General Appearance: Well nourished, in no apparent distress. Eyes: PERRLA, EOMs,  conjunctiva no swelling or erythema Sinuses: No Frontal/maxillary tenderness ENT/Mouth: Ext aud canals clear, TMs without erythema, bulging. No erythema, swelling, or exudate on post pharynx.  Tonsils not swollen or erythematous. Hearing normal.  Neck: Supple, thyroid normal.  Respiratory: Respiratory effort normal, BS equal bilaterally without rales, rhonchi, wheezing or stridor.  Cardio: RRR with no MRGs. Brisk peripheral pulses with mild edema.  Abdomen: Soft, + BS,  Non tender, no guarding, rebound, hernias, masses. Lymphatics: Non tender without lymphadenopathy.  Musculoskeletal: Full ROM, 5/5 strength, antalgic gait, left foot in a boot.  Skin:  Warm, dry without rashes, lesions, ecchymosis.  Neuro: Cranial nerves intact. Normal muscle tone, no cerebellar symptoms. Psych: Awake and oriented X 3, normal affect, Insight and Judgment appropriate.     Vicie Mutters, PA-C 8:27 AM [facility]

## 2020-01-23 ENCOUNTER — Other Ambulatory Visit: Payer: Self-pay

## 2020-01-23 ENCOUNTER — Ambulatory Visit (INDEPENDENT_AMBULATORY_CARE_PROVIDER_SITE_OTHER): Payer: PPO | Admitting: Physician Assistant

## 2020-01-23 ENCOUNTER — Encounter: Payer: Self-pay | Admitting: Physician Assistant

## 2020-01-23 VITALS — BP 122/82 | HR 66 | Temp 97.7°F | Ht 67.0 in | Wt 225.0 lb

## 2020-01-23 DIAGNOSIS — E1122 Type 2 diabetes mellitus with diabetic chronic kidney disease: Secondary | ICD-10-CM

## 2020-01-23 DIAGNOSIS — Z79899 Other long term (current) drug therapy: Secondary | ICD-10-CM

## 2020-01-23 DIAGNOSIS — E118 Type 2 diabetes mellitus with unspecified complications: Secondary | ICD-10-CM

## 2020-01-23 DIAGNOSIS — E1169 Type 2 diabetes mellitus with other specified complication: Secondary | ICD-10-CM | POA: Diagnosis not present

## 2020-01-23 DIAGNOSIS — K2100 Gastro-esophageal reflux disease with esophagitis, without bleeding: Secondary | ICD-10-CM

## 2020-01-23 DIAGNOSIS — I872 Venous insufficiency (chronic) (peripheral): Secondary | ICD-10-CM

## 2020-01-23 DIAGNOSIS — N183 Chronic kidney disease, stage 3 unspecified: Secondary | ICD-10-CM

## 2020-01-23 DIAGNOSIS — G9009 Other idiopathic peripheral autonomic neuropathy: Secondary | ICD-10-CM

## 2020-01-23 DIAGNOSIS — E039 Hypothyroidism, unspecified: Secondary | ICD-10-CM

## 2020-01-23 DIAGNOSIS — D649 Anemia, unspecified: Secondary | ICD-10-CM

## 2020-01-23 DIAGNOSIS — E782 Mixed hyperlipidemia: Secondary | ICD-10-CM | POA: Diagnosis not present

## 2020-01-23 DIAGNOSIS — Z87891 Personal history of nicotine dependence: Secondary | ICD-10-CM | POA: Diagnosis not present

## 2020-01-23 DIAGNOSIS — E559 Vitamin D deficiency, unspecified: Secondary | ICD-10-CM

## 2020-01-23 DIAGNOSIS — Z1211 Encounter for screening for malignant neoplasm of colon: Secondary | ICD-10-CM

## 2020-01-23 DIAGNOSIS — R6889 Other general symptoms and signs: Secondary | ICD-10-CM

## 2020-01-23 DIAGNOSIS — R0989 Other specified symptoms and signs involving the circulatory and respiratory systems: Secondary | ICD-10-CM

## 2020-01-23 DIAGNOSIS — Z0001 Encounter for general adult medical examination with abnormal findings: Secondary | ICD-10-CM

## 2020-01-23 DIAGNOSIS — E785 Hyperlipidemia, unspecified: Secondary | ICD-10-CM

## 2020-01-23 DIAGNOSIS — I2510 Atherosclerotic heart disease of native coronary artery without angina pectoris: Secondary | ICD-10-CM | POA: Diagnosis not present

## 2020-01-23 DIAGNOSIS — I700 Atherosclerosis of aorta: Secondary | ICD-10-CM

## 2020-01-23 DIAGNOSIS — Z85828 Personal history of other malignant neoplasm of skin: Secondary | ICD-10-CM

## 2020-01-23 DIAGNOSIS — Z0000 Encounter for general adult medical examination without abnormal findings: Secondary | ICD-10-CM

## 2020-01-23 DIAGNOSIS — K5730 Diverticulosis of large intestine without perforation or abscess without bleeding: Secondary | ICD-10-CM

## 2020-01-23 NOTE — Patient Instructions (Addendum)
Going to refer for colonoscopy  Colon cancer is 3rd most diagnosed cancer and 2nd leading cause of death in both men and women 72 years of age and older despite being one of the most preventable and treatable cancers if found early.  4 of out 5 people diagnosed with colon cancer have NO prior family history.  When caught EARLY 90% of colon cancer is curable.  Check out  Mini habits for weight loss book  2 apps for tracking food is myfitness pal  loseit OR can take picture of your food  I also like noom or weight watchers app for more structure.   Can check out [place] eating tips  What to Avoid . Avoid added sugars o Often added sugar can be found in processed foods such as many condiments, dry cereals, cakes, cookies, chips, crisps, crackers, candies, sweetened drinks, etc.  o Read labels and AVOID/DECREASE use of foods with the following in their ingredient list: Sugar, fructose, high fructose corn syrup, sucrose, glucose, maltose, dextrose, molasses, cane sugar, brown sugar, any type of syrup, agave nectar, etc.   . Avoid snacking in between meals- drink water or if you feel you need a snack, pick a high water content snack such as cucumbers, watermelon, or any veggie.  Marland Kitchen Avoid foods made with flour o If you are going to eat food made with flour, choose those made with whole-grains; and, minimize your consumption as much as is tolerable . Avoid processed foods o These foods are generally stocked in the middle of the grocery store.  o Focus on shopping on the perimeter of the grocery.  What to Include . Vegetables o GREEN LEAFY VEGETABLES: Kale, spinach, mustard greens, collard greens, cabbage, broccoli, etc. o OTHER: Asparagus, cauliflower, eggplant, carrots, peas, Brussel sprouts, tomatoes, bell peppers, zucchini, beets, cucumbers, etc. . Grains, seeds, and legumes o Beans: kidney beans, black eyed peas, garbanzo beans, black beans, pinto beans, etc. o Whole, unrefined  grains: brown rice, barley, bulgur, oatmeal, etc. . Healthy fats  o Avoid highly processed fats such as vegetable oil o Examples of healthy fats: avocado, olives, virgin olive oil, dark chocolate (?72% Cocoa), nuts (peanuts, almonds, walnuts, cashews, pecans, etc.) o Please still do small amount of these healthy fats, they are dense in calories.  . Low - Moderate Intake of Animal Sources of Protein o Meat sources: chicken, [place], salmon, tuna. Limit to 4 ounces of meat at one time or the size of your palm. o Consider limiting dairy sources, but when choosing dairy focus on: PLAIN [place] yogurt, cottage cheese, high-protein milk . Fruit o Choose berries    At this time we do not have a plan to get the COVID vaccine in our office. You can go to NightlifeExpo.ca to find out if you are eligible.  If you are eligible you can go to https://[email or web address]  to find a local place that is giving the shot.    NoveltyDoor.no also has all of this information if the sites above are not working for you.   There are also several waiting list that you can sign up for here are some below:  [place] go to [email or web address].br to sign up for notification when additional vaccine appointments are available.   You can also sign up at [facility] for a wait list.   [facility] and [facility] have a wait list on line and you can sign up for it there.     If you have further questions or concerns  about the vaccine process, please visit [email or web address]  Individuals seeking information about the vaccines and state's phased distribution plan can learn more by going to - [email or web address]

## 2020-01-24 LAB — HEMOGLOBIN A1C
Hgb A1c MFr Bld: 5.9 % of total Hgb — ABNORMAL HIGH (ref <5.7)
Mean Plasma Glucose: 123 (calc)
eAG (mmol/L): 6.8 (calc)

## 2020-01-24 LAB — COMPLETE METABOLIC PANEL WITH GFR
AG Ratio: 1.6 (calc) (ref 1.0–2.5)
ALT: 15 U/L (ref 6–29)
AST: 15 U/L (ref 10–35)
Albumin: 3.9 g/dL (ref 3.6–5.1)
Alkaline phosphatase (APISO): 103 U/L (ref 37–153)
BUN: 18 mg/dL (ref 7–25)
CO2: 32 mmol/L (ref 20–32)
Calcium: 9.3 mg/dL (ref 8.6–10.4)
Chloride: 106 mmol/L (ref 98–110)
Creat: 0.93 mg/dL (ref 0.60–0.93)
GFR, Est African American: 71 mL/min/{1.73_m2} (ref >=60)
GFR, Est Non African American: 61 mL/min/{1.73_m2} (ref >=60)
Globulin: 2.4 g/dL (calc) (ref 1.9–3.7)
Glucose, Bld: 101 mg/dL — ABNORMAL HIGH (ref 65–99)
Potassium: 4.4 mmol/L (ref 3.5–5.3)
Sodium: 145 mmol/L (ref 135–146)
Total Bilirubin: 0.8 mg/dL (ref 0.2–1.2)
Total Protein: 6.3 g/dL (ref 6.1–8.1)

## 2020-01-24 LAB — CBC WITH DIFFERENTIAL/PLATELET
Absolute Monocytes: 603 cells/uL (ref 200–950)
Basophils Absolute: 54 cells/uL (ref 0–200)
Basophils Relative: 0.6 %
Eosinophils Absolute: 207 cells/uL (ref 15–500)
Eosinophils Relative: 2.3 %
HCT: 41.5 % (ref 35.0–45.0)
Hemoglobin: 13.6 g/dL (ref 11.7–15.5)
Lymphs Abs: 2628 cells/uL (ref 850–3900)
MCH: 27.8 pg (ref 27.0–33.0)
MCHC: 32.8 g/dL (ref 32.0–36.0)
MCV: 84.7 fL (ref 80.0–100.0)
MPV: 11 fL (ref 7.5–12.5)
Monocytes Relative: 6.7 %
Neutro Abs: 5508 cells/uL (ref 1500–7800)
Neutrophils Relative %: 61.2 %
Platelets: 292 10*3/uL (ref 140–400)
RBC: 4.9 10*6/uL (ref 3.80–5.10)
RDW: 14.1 % (ref 11.0–15.0)
Total Lymphocyte: 29.2 %
WBC: 9 10*3/uL (ref 3.8–10.8)

## 2020-01-24 LAB — TSH: TSH: 0.85 mIU/L (ref 0.40–4.50)

## 2020-01-24 LAB — MAGNESIUM: Magnesium: 2.4 mg/dL (ref 1.5–2.5)

## 2020-01-24 LAB — LIPID PANEL
Cholesterol: 158 mg/dL (ref <200)
HDL: 39 mg/dL — ABNORMAL LOW (ref >=50)
LDL Cholesterol (Calc): 90 mg/dL (calc)
Non-HDL Cholesterol (Calc): 119 mg/dL (calc) (ref <130)
Total CHOL/HDL Ratio: 4.1 (calc) (ref <5.0)
Triglycerides: 203 mg/dL — ABNORMAL HIGH (ref <150)

## 2020-01-27 NOTE — Progress Notes (Signed)
Assessment and Plan:  1. Strep throat exposure - POCT rapid strep A - Negative   [name] was seen today for acute visit.  Diagnoses and all orders for this visit:  Strep throat exposure -     POCT rapid strep A  Acute non-recurrent pansinusitis -     predniSONE (STERAPRED UNI-PAK 21 TAB) 10 MG (21) TBPK tablet; Take by mouth daily. Follow directions on Taper pack Discussed supportive care with Vaseline, neosprin to inside of nasal passages.   May use vicks Discussed warm steamy shower or Warm compress to checks/forehead  Mixed hyperlipidemia Discussed dietary and exercise modifications Low fat diet -     ezetimibe (ZETIA) 10 MG tablet; Take 1 tablet (10 mg total) by mouth daily.  Labile hypertension Controlled today Monitor blood pressure at home; call if consistently over 130/80 Continue DASH diet.   Reminder to go to the ER if any CP, SOB, nausea, dizziness, severe HA, changes vision/speech, left arm numbness and tingling and jaw pain.    Further disposition pending results of labs. Discussed med's effects and SE's.   Over 30 minutes of face to face interview, exam, counseling, chart review, and critical decision making was performed.   Future Appointments  Date Time Provider [place]  02/17/2020  1:15 PM Marzetta Board, [place] [facility]  05/07/2020  2:30 PM Unk Pinto, MD [facility] None  10/12/2020  2:00 PM Vicie Mutters, PA-C [facility] None  01/27/2021  2:00 PM Vicie Mutters, PA-C [facility] None    ------------------------------------------------------------------------------------------------------------------   HPI 74 y.o.female presents for evaluation of sinus symptoms.  She was concerned about Strep as she reports that her grandchildren were diagnosed pos last week.  She reports that her symptoms started 2 days ago.  She was with her grandchildren 3days ago. She is having sinus pressure and headache and some dizziness.  She is having  nasal drainage that is green yellow,.  Tylenol arthritis 8hour for her knees.  She has not noticed this helping for her sinuses.  She denies any cough, fever, shortness of breath chest pains.  She does not use any nasal sprays or treatments.  Reports she is not willing to put anything up her nose.  Past Medical History:  Diagnosis Date  . Anemia   . Chest tightness   . Fatigue   . GERD (gastroesophageal reflux disease)   . Hyperlipidemia   . Hypothyroid   . Internal hemorrhoids 05/04/2009   Qualifier: Diagnosis of  By: Nelson-Smith CMA (AAMA), Dottie    . Myalgia   . Obesity   . Prediabetes   . Ulcer   . Varicose veins      Allergies  Allergen Reactions  . Ciprofloxacin   . Macrobid [Nitrofurantoin Macrocrystal]   . Penicillins     REACTION: rash  . Procaine Hcl     REACTION: difficulty breathing  . Sulfonamide Derivatives     REACTION: hives  . Tetanus Toxoid     REACTION: arm swells    Current Outpatient Medications on File Prior to Visit  Medication Sig  . Acetaminophen (TYLENOL ARTHRITIS PAIN PO) Take by mouth.  Marland Kitchen aspirin 81 MG tablet Take 81 mg by mouth daily.  Marland Kitchen atorvastatin (LIPITOR) 40 MG tablet Take 1 tablet Daily  for Cholesterol  . b complex vitamins tablet Take 1 tablet by mouth daily.  . diclofenac sodium (VOLTAREN) 1 % GEL Apply 4 g topically 4 (four) times daily.  Marland Kitchen dicyclomine (BENTYL) 20 MG tablet Take 1 tablet (20  mg total) by mouth 3 (three) times daily as needed for spasms.  . dorzolamide-timolol (COSOPT) 22.3-6.8 MG/ML ophthalmic solution USE 1 DROP INTO THE AFFECTED EYE 2 TIMES DAILY  . famotidine (PEPCID) 40 MG tablet Take 1 tablet at Bedtime for Indigestion & Heartburn  . furosemide (LASIX) 20 MG tablet Take 1 tablet 2 x /day for BP & Fluid Retention  . gabapentin (NEURONTIN) 800 MG tablet Take 1 tablet (800 mg total) by mouth 3 (three) times daily.  . Glucosamine-Chondroitin 1500-1200 MG/30ML LIQD Take by mouth.  . levothyroxine (SYNTHROID)  75 MCG tablet Take 1 to 1.5 tablet daily as directed on an empty stomach with only water for 30 minutes & No Antacid meds, Calcium or Magnesium for 4 hrs  . Multiple Vitamin (MULTIVITAMIN) tablet Take 1 tablet by mouth daily.  . nitroGLYCERIN (NITROSTAT) 0.4 MG SL tablet Place 1 tablet (0.4 mg total) under the tongue every 5 (five) minutes as needed for chest pain.  . pantoprazole (PROTONIX) 40 MG tablet Take 1 tablet (40 mg total) by mouth daily.  . Probiotic CAPS Take by mouth.  . verapamil (CALAN) 80 MG tablet TAKE AS NEEDED FOR PALPITATIONS UP TO 3 X A DAY   No current facility-administered medications on file prior to visit.    ROS: all negative except above.   Physical Exam:  BP 126/72   Pulse 67   Temp (!) 97.5 F (36.4 C)   Wt 223 lb (101.2 kg)   SpO2 98%   BMI 34.93 kg/m   General Appearance: Well nourished, in no apparent distress. Eyes: PERRLA, EOMs, conjunctiva no swelling or erythema Sinuses: Frontal/maxillary tenderness noted.  Erythema noted to bilateral nasal canals. ENT/Mouth: Ext aud canals clear, TMs without erythema, bulging. No erythema, swelling, or exudate on post pharynx.  Tonsils not swollen or erythematous. Hearing normal.  Neck: Supple, thyroid normal.  Respiratory: Respiratory effort normal, BS equal bilaterally without rales, rhonchi, wheezing or stridor.  Cardio: RRR with no MRGs. Brisk peripheral pulses without edema.  Abdomen: Soft, + BS.  Non tender, no guarding, rebound, hernias, masses. Lymphatics: Non tender without lymphadenopathy.  Musculoskeletal: Full ROM, 5/5 strength, normal gait.  Skin: Warm, dry without rashes, lesions, ecchymosis.  Neuro: Cranial nerves intact. Normfal muscle tone, no cerebellar symptoms. Sensation intact.  Psych: Awake and oriented X 3, normal affect, Insight and Judgment appropriate.     Garnet Sierras, NP 12:22 PM [facility]

## 2020-01-28 ENCOUNTER — Encounter: Payer: Self-pay | Admitting: Adult Health Nurse Practitioner

## 2020-01-28 ENCOUNTER — Other Ambulatory Visit: Payer: Self-pay

## 2020-01-28 ENCOUNTER — Ambulatory Visit (INDEPENDENT_AMBULATORY_CARE_PROVIDER_SITE_OTHER): Payer: PPO | Admitting: Adult Health Nurse Practitioner

## 2020-01-28 VITALS — BP 126/72 | HR 67 | Temp 97.5°F | Wt 223.0 lb

## 2020-01-28 DIAGNOSIS — E782 Mixed hyperlipidemia: Secondary | ICD-10-CM

## 2020-01-28 DIAGNOSIS — Z20818 Contact with and (suspected) exposure to other bacterial communicable diseases: Secondary | ICD-10-CM

## 2020-01-28 DIAGNOSIS — R0989 Other specified symptoms and signs involving the circulatory and respiratory systems: Secondary | ICD-10-CM

## 2020-01-28 DIAGNOSIS — J0140 Acute pansinusitis, unspecified: Secondary | ICD-10-CM | POA: Diagnosis not present

## 2020-01-28 LAB — POCT RAPID STREP A (OFFICE): Rapid Strep A Screen: NEGATIVE

## 2020-01-28 MED ORDER — EZETIMIBE 10 MG PO TABS: 10.0000 mg | ORAL_TABLET | Freq: Every day | ORAL | 3 refills | Status: DC

## 2020-01-28 MED ORDER — PREDNISONE 10 MG (21) PO TBPK: ORAL_TABLET | Freq: Every day | ORAL | 0 refills | Status: DC

## 2020-01-28 NOTE — Patient Instructions (Addendum)
    Prednisone taper pack.   This will help with the inflammation in your sinuses.   Take Claritin 10mg  daily or Zyrtec every night / Allegra.  Increase water intake.   Tylenol 1,000mg  twice a day as needed for pain.  You can use a warm wash cloth, steamy shower will help to break up nasal congestion.     Vicks vapor rub under nose to help open up nasal passage.  You may use neosporin or Vaseline on the inside of your nose to help relief the soreness.   Please contact office with any new or worsening symptoms

## 2020-01-31 ENCOUNTER — Ambulatory Visit: Payer: PPO | Admitting: Podiatry

## 2020-02-17 ENCOUNTER — Ambulatory Visit: Payer: PPO | Admitting: Cardiology

## 2020-02-17 ENCOUNTER — Other Ambulatory Visit: Payer: Self-pay

## 2020-02-17 ENCOUNTER — Ambulatory Visit: Payer: PPO | Admitting: Podiatry

## 2020-02-17 DIAGNOSIS — B351 Tinea unguium: Secondary | ICD-10-CM

## 2020-02-17 DIAGNOSIS — E1142 Type 2 diabetes mellitus with diabetic polyneuropathy: Secondary | ICD-10-CM

## 2020-02-17 DIAGNOSIS — L600 Ingrowing nail: Secondary | ICD-10-CM

## 2020-02-17 DIAGNOSIS — L84 Corns and callosities: Secondary | ICD-10-CM | POA: Diagnosis not present

## 2020-02-17 DIAGNOSIS — M79675 Pain in left toe(s): Secondary | ICD-10-CM

## 2020-02-17 DIAGNOSIS — M79674 Pain in right toe(s): Secondary | ICD-10-CM

## 2020-02-17 NOTE — Patient Instructions (Signed)
Diabetes Mellitus and Foot Care Foot care is an important part of your health, especially when you have diabetes. Diabetes may cause you to have problems because of poor blood flow (circulation) to your feet and legs, which can cause your skin to:  Become thinner and drier.  Break more easily.  Heal more slowly.  Peel and crack. You may also have nerve damage (neuropathy) in your legs and feet, causing decreased feeling in them. This means that you may not notice minor injuries to your feet that could lead to more serious problems. Noticing and addressing any potential problems early is the best way to prevent future foot problems. How to care for your feet Foot hygiene  Wash your feet daily with warm water and mild soap. Do not use hot water. Then, pat your feet and the areas between your toes until they are completely dry. Do not soak your feet as this can dry your skin.  Trim your toenails straight across. Do not dig under them or around the cuticle. File the edges of your nails with an emery board or nail file.  Apply a moisturizing lotion or petroleum jelly to the skin on your feet and to dry, brittle toenails. Use lotion that does not contain alcohol and is unscented. Do not apply lotion between your toes. Shoes and socks  Wear clean socks or stockings every day. Make sure they are not too tight. Do not wear knee-high stockings since they may decrease blood flow to your legs.  Wear shoes that fit properly and have enough cushioning. Always look in your shoes before you put them on to be sure there are no objects inside.  To break in new shoes, wear them for just a few hours a day. This prevents injuries on your feet. Wounds, scrapes, corns, and calluses  Check your feet daily for blisters, cuts, bruises, sores, and redness. If you cannot see the bottom of your feet, use a mirror or ask someone for help.  Do not cut corns or calluses or try to remove them with medicine.  If you  find a minor scrape, cut, or break in the skin on your feet, keep it and the skin around it clean and dry. You may clean these areas with mild soap and water. Do not clean the area with peroxide, alcohol, or iodine.  If you have a wound, scrape, corn, or callus on your foot, look at it several times a day to make sure it is healing and not infected. Check for: ? Redness, swelling, or pain. ? Fluid or blood. ? Warmth. ? Pus or a bad smell. General instructions  Do not cross your legs. This may decrease blood flow to your feet.  Do not use heating pads or hot water bottles on your feet. They may burn your skin. If you have lost feeling in your feet or legs, you may not know this is happening until it is too late.  Protect your feet from hot and cold by wearing shoes, such as at the beach or on hot pavement.  Schedule a complete foot exam at least once a year (annually) or more often if you have foot problems. If you have foot problems, report any cuts, sores, or bruises to your health care provider immediately. Contact a health care provider if:  You have a medical condition that increases your risk of infection and you have any cuts, sores, or bruises on your feet.  You have an injury that is not   healing.  You have redness on your legs or feet.  You feel burning or tingling in your legs or feet.  You have pain or cramps in your legs and feet.  Your legs or feet are numb.  Your feet always feel cold.  You have pain around a toenail. Get help right away if:  You have a wound, scrape, corn, or callus on your foot and: ? You have pain, swelling, or redness that gets worse. ? You have fluid or blood coming from the wound, scrape, corn, or callus. ? Your wound, scrape, corn, or callus feels warm to the touch. ? You have pus or a bad smell coming from the wound, scrape, corn, or callus. ? You have a fever. ? You have a red line going up your leg. Summary  Check your feet every day  for cuts, sores, red spots, swelling, and blisters.  Moisturize feet and legs daily.  Wear shoes that fit properly and have enough cushioning.  If you have foot problems, report any cuts, sores, or bruises to your health care provider immediately.  Schedule a complete foot exam at least once a year (annually) or more often if you have foot problems. This information is not intended to replace advice given to you by your health care provider. Make sure you discuss any questions you have with your health care provider. Document Revised: 07/31/2019 Document Reviewed: 12/09/2016 Elsevier Patient Education  2020 [employer]  Corns and Calluses Corns are small areas of thickened skin that occur on the top, sides, or tip of a toe. They contain a cone-shaped core with a point that can press on a nerve below. This causes pain.  Calluses are areas of thickened skin that can occur anywhere on the body, including the hands, fingers, palms, soles of the feet, and heels. Calluses are usually larger than corns. What are the causes? Corns and calluses are caused by rubbing (friction) or pressure, such as from shoes that are too tight or do not fit properly. What increases the risk? Corns are more likely to develop in people who have misshapen toes (toe deformities), such as hammer toes. Calluses can occur with friction to any area of the skin. They are more likely to develop in people who:  Work with their hands.  Wear shoes that fit poorly, are too tight, or are high-heeled.  Have toe deformities. What are the signs or symptoms? Symptoms of a corn or callus include:  A hard growth on the skin.  Pain or tenderness under the skin.  Redness and swelling.  Increased discomfort while wearing tight-fitting shoes, if your feet are affected. If a corn or callus becomes infected, symptoms may include:  Redness and swelling that gets worse.  Pain.  Fluid, blood, or pus draining from the corn or  callus. How is this diagnosed? Corns and calluses may be diagnosed based on your symptoms, your medical history, and a physical exam. How is this treated? Treatment for corns and calluses may include:  Removing the cause of the friction or pressure. This may involve: ? Changing your shoes. ? Wearing shoe inserts (orthotics) or other protective layers in your shoes, such as a corn pad. ? Wearing gloves.  Applying medicine to the skin (topical medicine) to help soften skin in the hardened, thickened areas.  Removing layers of dead skin with a file to reduce the size of the corn or callus.  Removing the corn or callus with a scalpel or laser.  Taking   antibiotic medicines, if your corn or callus is infected.  Having surgery, if a toe deformity is the cause. Follow these instructions at home:   Take over-the-counter and prescription medicines only as told by your health care provider.  If you were prescribed an antibiotic, take it as told by your health care provider. Do not stop taking it even if your condition starts to improve.  Wear shoes that fit well. Avoid wearing high-heeled shoes and shoes that are too tight or too loose.  Wear any padding, protective layers, gloves, or orthotics as told by your health care provider.  Soak your hands or feet and then use a file or pumice stone to soften your corn or callus. Do this as told by your health care provider.  Check your corn or callus every day for symptoms of infection. Contact a health care provider if you:  Notice that your symptoms do not improve with treatment.  Have redness or swelling that gets worse.  Notice that your corn or callus becomes painful.  Have fluid, blood, or pus coming from your corn or callus.  Have new symptoms. Summary  Corns are small areas of thickened skin that occur on the top, sides, or tip of a toe.  Calluses are areas of thickened skin that can occur anywhere on the body, including the  hands, fingers, palms, and soles of the feet. Calluses are usually larger than corns.  Corns and calluses are caused by rubbing (friction) or pressure, such as from shoes that are too tight or do not fit properly.  Treatment may include wearing any padding, protective layers, gloves, or orthotics as told by your health care provider. This information is not intended to replace advice given to you by your health care provider. Make sure you discuss any questions you have with your health care provider. Document Revised: 02/27/2019 Document Reviewed: 09/20/2017 Elsevier Patient Education  2020 [employer]  Onychomycosis/Fungal Toenails  WHAT IS IT? An infection that lies within the keratin of your nail plate that is caused by a fungus.  WHY ME? Fungal infections affect all ages, sexes, races, and creeds.  There may be many factors that predispose you to a fungal infection such as age, coexisting medical conditions such as diabetes, or an autoimmune disease; stress, medications, fatigue, genetics, etc.  Bottom line: fungus thrives in a warm, moist environment and your shoes offer such a location.  IS IT CONTAGIOUS? Theoretically, yes.  You do not want to share shoes, nail clippers or files with someone who has fungal toenails.  Walking around barefoot in the same room or sleeping in the same bed is unlikely to transfer the organism.  It is important to realize, however, that fungus can spread easily from one nail to the next on the same foot.  HOW DO WE TREAT THIS?  There are several ways to treat this condition.  Treatment may depend on many factors such as age, medications, pregnancy, liver and kidney conditions, etc.  It is best to ask your doctor which options are available to you.  8. No treatment.   Unlike many other medical concerns, you can live with this condition.  However for many people this can be a painful condition and may lead to ingrown toenails or a bacterial infection.  It is  recommended that you keep the nails cut short to help reduce the amount of fungal nail. 9. Topical treatment.  These range from herbal remedies to prescription strength nail lacquers.  About 40-50%   effective, topicals require twice daily application for approximately 9 to 12 months or until an entirely new nail has grown out.  The most effective topicals are medical grade medications available through physicians offices. 10. Oral antifungal medications.  With an 80-90% cure rate, the most common oral medication requires 3 to 4 months of therapy and stays in your system for a year as the new nail grows out.  Oral antifungal medications do require blood work to make sure it is a safe drug for you.  A liver function panel will be performed prior to starting the medication and after the first month of treatment.  It is important to have the blood work performed to avoid any harmful side effects.  In general, this medication safe but blood work is required. 11. Laser Therapy.  This treatment is performed by applying a specialized laser to the affected nail plate.  This therapy is noninvasive, fast, and non-painful.  It is not covered by insurance and is therefore, out of pocket.  The results have been very good with a 80-95% cure rate.  [facility] is the only practice in the area to offer this therapy. 12. Permanent Nail Avulsion.  Removing the entire nail so that a new nail will not grow back. 

## 2020-02-19 ENCOUNTER — Encounter: Payer: Self-pay | Admitting: Podiatry

## 2020-02-19 NOTE — Progress Notes (Addendum)
Subjective: [name] presents today for follow up of preventative diabetic foot care and corn(s) b/l feet and callus(es) b/l feet and painful mycotic nails b/l.  Pain interferes with ambulation. Aggravating factors include wearing enclosed shoe gear. Pain is relieved with periodic debridement.  Allergies  Allergen Reactions  . Ciprofloxacin   . Macrobid [Nitrofurantoin Macrocrystal]   . Penicillins     REACTION: rash  . Procaine Hcl     REACTION: difficulty breathing  . Sulfonamide Derivatives     REACTION: hives  . Tetanus Toxoid     REACTION: arm swells     Objective: There were no vitals filed for this visit.  Pt 74 y.o. year old Caucasian female  in NAD. AAO x 3.   Vascular Examination:  Capillary fill time to digits <3 seconds b/l. Palpable DP pulses b/l. Palpable PT pulses b/l. Pedal hair absent b/l Skin temperature gradient within normal limits b/l.  Dermatological Examination: Pedal skin with normal turgor, texture and tone bilaterally. No open wounds bilaterally. No interdigital macerations bilaterally. Toenails 1-5 b/l elongated, dystrophic, thickened, crumbly with subungual debris and tenderness to dorsal palpation. Hyperkeratotic lesion(s) L hallux, L 2nd toe, R hallux, R 2nd toe, R 3rd toe and submet head 5 left foot.  No erythema, no edema, no drainage, no flocculence.   Incurvated nailplate right great toe lateral border(s) with tenderness to palpation. No erythema, no edema, no drainage noted.   Musculoskeletal: Normal muscle strength 5/5 to all lower extremity muscle groups bilaterally, no pain crepitus or joint limitation noted with ROM b/l and hammertoes noted to the  1-5 bilaterally  Neurological: Protective sensation diminished with 10g monofilament b/l.  Assessment: 1. Pain due to onychomycosis of toenails of both feet   2. Ingrown toenail without infection   3. Corns and callosities   4. Diabetic peripheral neuropathy associated with type 2  diabetes mellitus ([place])    Plan: -Continue diabetic foot care principles. Literature dispensed on today.  -Toenails 1-5 b/l were debrided in length and girth with sterile nail nippers and dremel without iatrogenic bleeding.  -Corn(s) L 2nd toe, R 2nd toe and R 3rd toe and callus(es) L hallux, R hallux and submet head 5 left foot were debrided without complication or incident. Total number debrided 6. -Patient to continue soft, supportive shoe gear daily. -Patient to report any pedal injuries to medical professional immediately. -Offending nail border debrided and curretaged R hallux. Border(s) cleansed with alcohol and triple antibioitic ointment applied. Patient instructed to apply Neopsorin Cream to R hallux once daily for 7 days. -Patient/POA to call should there be question/concern in the interim.  Return in about 10 weeks (around 04/27/2020) for diabetic nail and callus trim.

## 2020-04-08 ENCOUNTER — Other Ambulatory Visit: Payer: Self-pay | Admitting: Physician Assistant

## 2020-04-21 ENCOUNTER — Ambulatory Visit: Payer: PPO | Admitting: Adult Health Nurse Practitioner

## 2020-04-23 ENCOUNTER — Ambulatory Visit (INDEPENDENT_AMBULATORY_CARE_PROVIDER_SITE_OTHER): Payer: PPO | Admitting: Physician Assistant

## 2020-04-23 ENCOUNTER — Other Ambulatory Visit: Payer: Self-pay

## 2020-04-23 ENCOUNTER — Encounter: Payer: Self-pay | Admitting: Physician Assistant

## 2020-04-23 VITALS — BP 126/74 | HR 54 | Temp 97.3°F | Wt 232.0 lb

## 2020-04-23 DIAGNOSIS — M545 Low back pain, unspecified: Secondary | ICD-10-CM

## 2020-04-23 DIAGNOSIS — R109 Unspecified abdominal pain: Secondary | ICD-10-CM | POA: Diagnosis not present

## 2020-04-23 MED ORDER — MELOXICAM 15 MG PO TABS: ORAL_TABLET | ORAL | 1 refills | Status: DC

## 2020-04-23 MED ORDER — CEFUROXIME AXETIL 250 MG PO TABS: 250.0000 mg | ORAL_TABLET | Freq: Two times a day (BID) | ORAL | 0 refills | Status: AC

## 2020-04-23 MED ORDER — DEXAMETHASONE SODIUM PHOSPHATE 100 MG/10ML IJ SOLN: 10.0000 mg | Freq: Once | INTRAMUSCULAR | Status: DC

## 2020-04-23 MED ORDER — FLUCONAZOLE 150 MG PO TABS: 150.0000 mg | ORAL_TABLET | Freq: Every day | ORAL | 3 refills | Status: DC

## 2020-04-23 NOTE — Progress Notes (Signed)
Subjective:    Patient ID: [name], female    DOB: [birth year], 74 y.o.   MRN: [id number]  HPI 74 y.o. obese WF with history of diverticulitis 2018 presents with left flank pain.  She states last Thursday, she started to have left flank pain, intermittent, sudden grabbing pain that would take her breath away, worse with movement, better with sitting. [place], was feeling better but then it started again Tuesday/Wednesday not as bad.   No history of kidney stones.  She denies chills, fever,  hematuria, frequency, nausea, diarrhea, constipation, and urinary incontinence. She feels bloating lower AB.  CT AB 2018 showed colitis/diverticulitis.  She is due for colonoscopy this year  Tyelnol did not help some, heating pad helps some.   Lab Results  Component Value Date   HGBA1C 5.9 (H) 01/23/2020    Blood pressure 126/74, pulse (!) 54, temperature (!) 97.3 F (36.3 C), weight 232 lb (105.2 kg), SpO2 97 %.  Medications  Current Outpatient Medications (Endocrine & Metabolic):  .  levothyroxine (SYNTHROID) 75 MCG tablet, Take 1 to 1.5 tablet daily as directed on an empty stomach with only water for 30 minutes & No Antacid meds, Calcium or Magnesium for 4 hrs  Current Outpatient Medications (Cardiovascular):  .  atorvastatin (LIPITOR) 40 MG tablet, Take 1 tablet Daily  for Cholesterol .  ezetimibe (ZETIA) 10 MG tablet, Take 1 tablet (10 mg total) by mouth daily. .  furosemide (LASIX) 20 MG tablet, Take 1 tablet 2 x /day for BP & Fluid Retention .  verapamil (CALAN) 80 MG tablet, TAKE AS NEEDED FOR PALPITATIONS UP TO 3 X A DAY .  nitroGLYCERIN (NITROSTAT) 0.4 MG SL tablet, Place 1 tablet (0.4 mg total) under the tongue every 5 (five) minutes as needed for chest pain.   Current Outpatient Medications (Analgesics):  Marland Kitchen  Acetaminophen (TYLENOL ARTHRITIS PAIN PO), Take by mouth. Marland Kitchen  aspirin 81 MG tablet, Take 81 mg by mouth daily.   Current  Outpatient Medications (Other):  .  b complex vitamins tablet, Take 1 tablet by mouth daily. .  diclofenac sodium (VOLTAREN) 1 % GEL, Apply 4 g topically 4 (four) times daily. .  dorzolamide-timolol (COSOPT) 22.3-6.8 MG/ML ophthalmic solution, USE 1 DROP INTO THE AFFECTED EYE 2 TIMES DAILY .  famotidine (PEPCID) 40 MG tablet, Take 1 tablet at Bedtime for Indigestion & Heartburn .  gabapentin (NEURONTIN) 800 MG tablet, Take 1 tablet by mouth 3 times a day .  Glucosamine-Chondroitin 1500-1200 MG/30ML LIQD, Take by mouth. .  Multiple Vitamin (MULTIVITAMIN) tablet, Take 1 tablet by mouth daily. .  pantoprazole (PROTONIX) 40 MG tablet, Take 1 tablet (40 mg total) by mouth daily. .  Probiotic CAPS, Take by mouth. .  dicyclomine (BENTYL) 20 MG tablet, Take 1 tablet (20 mg total) by mouth 3 (three) times daily as needed for spasms.  Problem list She has Diverticulosis of large intestine; History of tobacco use; Mixed hyperlipidemia; Hypothyroidism; Anemia; Controlled type 2 diabetes mellitus with complication, without long-term current use of insulin ([place]); Class 2 severe obesity due to excess calories with serious comorbidity and body mass index (BMI) of 37.0 to 37.9 in adult [facility]); Chronic venous insufficiency; Peripheral autonomic neuropathy of unknown cause; Labile hypertension; History of basal cell carcinoma (BCC); Osteoarthritis of knee; Atherosclerosis of aorta ([place]); Vitamin D deficiency; Medication management; Gastroesophageal reflux disease with esophagitis; Type 2 diabetes mellitus with hyperlipidemia ([place]); CKD stage 3 secondary to diabetes (  [place]); and CAD (coronary artery disease) on their problem list.   Review of Systems  Constitutional: Negative.  Negative for appetite change, chills, fatigue and fever.  HENT: Negative.   Respiratory: Negative.   Cardiovascular: Negative.  Negative for leg swelling.  Gastrointestinal: Positive for abdominal distention (some lower AB fullness). Negative  for abdominal pain, blood in stool, constipation, diarrhea, nausea and vomiting.  Genitourinary: Positive for flank pain. Negative for decreased urine volume, difficulty urinating, dyspareunia, dysuria, enuresis, frequency, genital sores, hematuria, menstrual problem, pelvic pain, urgency, vaginal bleeding, vaginal discharge and vaginal pain.  Musculoskeletal: Positive for back pain. Negative for arthralgias and gait problem.  Skin: Negative.  Negative for rash.  Neurological: Negative.   Psychiatric/Behavioral: Negative.        Objective:   Physical Exam Constitutional:      General: She is not in acute distress.    Appearance: She is obese. She is not ill-appearing.  HENT:     Right Ear: Tympanic membrane and ear canal normal.     Left Ear: Tympanic membrane and ear canal normal.  Eyes:     Extraocular Movements: Extraocular movements intact.     Pupils: Pupils are equal, round, and reactive to light.  Cardiovascular:     Rate and Rhythm: Normal rate and regular rhythm.     Pulses: Normal pulses.     Heart sounds: Normal heart sounds.  Pulmonary:     Effort: Pulmonary effort is normal.     Breath sounds: Normal breath sounds.  Abdominal:     General: Bowel sounds are normal.     Palpations: Abdomen is soft. There is no mass.     Tenderness: There is no abdominal tenderness. There is no right CVA tenderness, left CVA tenderness, guarding or rebound.     Comments: Patient unable to lay flat due to back pain with change in position  Musculoskeletal:        General: Tenderness present.     Comments: Patient is able to ambulate well. Gait is  Antalgic. Straight leg raising with dorsiflexion negative bilaterally for radicular symptoms. Sensory exam in the legs is abnormal, has bilateral numbness to shin that is unchanged for patient. Knee reflexes are normal Ankle reflexes are normal Strength is normal and symmetric in arms and legs. There is not SI tenderness to palpation.  There is  left sided paraspinal muscle spasm.  There is mild midline tenderness.  ROM of spine with  limited in all spheres due to pain, changes in positions with pain.    Skin:    General: Skin is warm and dry.     Findings: No erythema or rash.  Neurological:     General: No focal deficit present.     Mental Status: She is alert and oriented to person, place, and time.        Assessment & Plan:    Left flank pain No nausea, no diarrhea, no urinary symptoms, no rash No injury but appears to be very mechanical, worse with change in position, palpation.  Will check CBC/CMET and urine to rule out kidney stone, infection Will treat like muscle spasm, trigger point done in the office, continue heating pad, mobic, and tylenol Given ABX since going into the weekend and patient has history of atypical diverticulitis presentation more posterior however no other symptoms- she will only take if she starts to have symptoms of infection -     CBC with Differential/Platelet -     COMPLETE METABOLIC PANEL  WITH GFR -     Urinalysis, Routine w reflex microscopic -     Urine Culture  Acute left-sided low back pain without sciatica -     dexamethasone (DECADRON) injection 10 mg  Other orders -     cefUROXime (CEFTIN) 250 MG tablet; Take 1 tablet (250 mg total) by mouth 2 (two) times daily for 10 days. -     meloxicam (MOBIC) 15 MG tablet; Take one daily with food for 2 weeks, can take with tylenol, can not take with aleve, iburpofen, then as needed daily for pain -     fluconazole (DIFLUCAN) 150 MG tablet; Take 1 tablet (150 mg total) by mouth daily.   Verbal consent obtained, risk and benefits were addressed with patient. A trigger point injection was performed at the site of maximal tenderness using 1% plain Lidocaine and Dexamethasone. This was well tolerated, and followed by immediate relief of pain. Return precautions discussed with the patient.

## 2020-04-23 NOTE — Patient Instructions (Addendum)
Will do injection, this will take 2-3 days to kick in Can take mobic in the mean time  Mobic is an antiinflammatory It helps pain, can not take with aleve, or ibuprofen You can take tylenol (500mg ) or tylenol arthritis (650mg ) with the meloxicam/antiinflammatories. The max you can take of tylenol a day is 3000mg  daily, this is a max of 6 pills a day of the regular tyelnol (500mg ) or a max of 4 a day of the tylenol arthritis (650mg ) as long as no other medications you are taking contain tylenol.   Mobic can cause inflammation in your stomach and can cause ulcers or bleeding, this will look like black tarry stools Make sure you take your mobic with food Try not to take it daily, take AS needed Can take with Pepcid   If you start to have nausea, diarrhea, fever, chills, urinary symptoms anything start on the antibiotic  BACK PAIN  Try the exercises and other information in the back care manual.   You can take meloxicam once during the day as needed (avoid taking other NSAIDS like Alleve or Ibuprofen while taking this)   Go to the ER if you have any new weakness in your legs, have trouble controlling your urine or bowels, or have worsening pain.   If you are not better in 1-3 month we will refer you to ortho   Back pain Rehab Ask your health care provider which exercises are safe for you. Do exercises exactly as told by your health care provider and adjust them as directed. It is normal to feel mild stretching, pulling, tightness, or discomfort as you do these exercises, but you should stop right away if you feel sudden pain or your pain gets worse. Do not begin these exercises until told by your health care provider. Stretching and range of motion exercises These exercises warm up your muscles and joints and improve the movement and flexibility of your hips and your back. These exercises also help to relieve pain, numbness, and tingling. Exercise A: Sciatic nerve glide 1. Sit in a chair  with your head facing down toward your chest. Place your hands behind your back. Let your shoulders slump forward. 2. Slowly straighten one of your knees while you tilt your head back as if you are looking toward the ceiling. Only straighten your leg as far as you can without making your symptoms worse. 3. Hold for __________ seconds. 4. Slowly return to the starting position. 5. Repeat with your other leg. Repeat __________ times. Complete this exercise __________ times a day. Exercise B: Knee to chest with hip adduction and internal rotation  1. Lie on your back on a firm surface with both legs straight. 2. Bend one of your knees and move it up toward your chest until you feel a gentle stretch in your lower back and buttock. Then, move your knee toward the shoulder that is on the opposite side from your leg. ? Hold your leg in this position by holding onto the front of your knee. 3. Hold for __________ seconds. 4. Slowly return to the starting position. 5. Repeat with your other leg. Repeat __________ times. Complete this exercise __________ times a day. Exercise C: Prone extension on elbows  1. Lie on your abdomen on a firm surface. A bed may be too soft for this exercise. 2. Prop yourself up on your elbows. 3. Use your arms to help lift your chest up until you feel a gentle stretch in your abdomen and  your lower back. ? This will place some of your body weight on your elbows. If this is uncomfortable, try stacking pillows under your chest. ? Your hips should stay down, against the surface that you are lying on. Keep your hip and back muscles relaxed. 4. Hold for __________ seconds. 5. Slowly relax your upper body and return to the starting position. Repeat __________ times. Complete this exercise __________ times a day. Strengthening exercises These exercises build strength and endurance in your back. Endurance is the ability to use your muscles for a long time, even after they get  tired. Exercise D: Pelvic tilt 1. Lie on your back on a firm surface. Bend your knees and keep your feet flat. 2. Tense your abdominal muscles. Tip your pelvis up toward the ceiling and flatten your lower back into the floor. ? To help with this exercise, you may place a small towel under your lower back and try to push your back into the towel. 3. Hold for __________ seconds. 4. Let your muscles relax completely before you repeat this exercise. Repeat __________ times. Complete this exercise __________ times a day. Exercise E: Alternating arm and leg raises  1. Get on your hands and knees on a firm surface. If you are on a hard floor, you may want to use padding to cushion your knees, such as an exercise mat. 2. Line up your arms and legs. Your hands should be below your shoulders, and your knees should be below your hips. 3. Lift your left leg behind you. At the same time, raise your right arm and straighten it in front of you. ? Do not lift your leg higher than your hip. ? Do not lift your arm higher than your shoulder. ? Keep your abdominal and back muscles tight. ? Keep your hips facing the ground. ? Do not arch your back. ? Keep your balance carefully, and do not hold your breath. 4. Hold for __________ seconds. 5. Slowly return to the starting position and repeat with your right leg and your left arm. Repeat __________ times. Complete this exercise __________ times a day. Posture and body mechanics  Body mechanics refers to the movements and positions of your body while you do your daily activities. Posture is part of body mechanics. Good posture and healthy body mechanics can help to relieve stress in your body's tissues and joints. Good posture means that your spine is in its natural S-curve position (your spine is neutral), your shoulders are pulled back slightly, and your head is not tipped forward. The following are general guidelines for applying improved posture and body  mechanics to your everyday activities. Standing   When standing, keep your spine neutral and your feet about hip-width apart. Keep a slight bend in your knees. Your ears, shoulders, and hips should line up.  When you do a task in which you stand in one place for a long time, place one foot up on a stable object that is 2-4 inches (5-10 cm) high, such as a footstool. This helps keep your spine neutral. Sitting   When sitting, keep your spine neutral and keep your feet flat on the floor. Use a footrest, if necessary, and keep your thighs parallel to the floor. Avoid rounding your shoulders, and avoid tilting your head forward.  When working at a desk or a computer, keep your desk at a height where your hands are slightly lower than your elbows. Slide your chair under your desk so you are close  enough to maintain good posture.  When working at a computer, place your monitor at a height where you are looking straight ahead and you do not have to tilt your head forward or downward to look at the screen. Resting   When lying down and resting, avoid positions that are most painful for you.  If you have pain with activities such as sitting, bending, stooping, or squatting (flexion-based activities), lie in a position in which your body does not bend very much. For example, avoid curling up on your side with your arms and knees near your chest (fetal position).  If you have pain with activities such as standing for a long time or reaching with your arms (extension-based activities), lie with your spine in a neutral position and bend your knees slightly. Try the following positions: ? Lying on your side with a pillow between your knees. ? Lying on your back with a pillow under your knees. Lifting   When lifting objects, keep your feet at least shoulder-width apart and tighten your abdominal muscles.  Bend your knees and hips and keep your spine neutral. It is important to lift using the strength  of your legs, not your back. Do not lock your knees straight out.  Always ask for help to lift heavy or awkward objects. This information is not intended to replace advice given to you by your health care provider. Make sure you discuss any questions you have with your health care provider. Document Released: 11/07/2005 Document Revised: 07/14/2016 Document Reviewed: 07/24/2015 Elsevier Interactive Patient Education  [employer].

## 2020-04-24 LAB — CBC WITH DIFFERENTIAL/PLATELET
Absolute Monocytes: 708 cells/uL (ref 200–950)
Basophils Absolute: 58 cells/uL (ref 0–200)
Basophils Relative: 0.6 %
Eosinophils Absolute: 146 cells/uL (ref 15–500)
Eosinophils Relative: 1.5 %
HCT: 43 % (ref 35.0–45.0)
Hemoglobin: 14.2 g/dL (ref 11.7–15.5)
Lymphs Abs: 3056 cells/uL (ref 850–3900)
MCH: 28.6 pg (ref 27.0–33.0)
MCHC: 33 g/dL (ref 32.0–36.0)
MCV: 86.7 fL (ref 80.0–100.0)
MPV: 10.8 fL (ref 7.5–12.5)
Monocytes Relative: 7.3 %
Neutro Abs: 5733 cells/uL (ref 1500–7800)
Neutrophils Relative %: 59.1 %
Platelets: 285 10*3/uL (ref 140–400)
RBC: 4.96 10*6/uL (ref 3.80–5.10)
RDW: 14.4 % (ref 11.0–15.0)
Total Lymphocyte: 31.5 %
WBC: 9.7 10*3/uL (ref 3.8–10.8)

## 2020-04-24 LAB — COMPLETE METABOLIC PANEL WITH GFR
AG Ratio: 1.6 (calc) (ref 1.0–2.5)
ALT: 14 U/L (ref 6–29)
AST: 16 U/L (ref 10–35)
Albumin: 4.1 g/dL (ref 3.6–5.1)
Alkaline phosphatase (APISO): 92 U/L (ref 37–153)
BUN/Creatinine Ratio: 21 (calc) (ref 6–22)
BUN: 20 mg/dL (ref 7–25)
CO2: 27 mmol/L (ref 20–32)
Calcium: 9.2 mg/dL (ref 8.6–10.4)
Chloride: 106 mmol/L (ref 98–110)
Creat: 0.97 mg/dL — ABNORMAL HIGH (ref 0.60–0.93)
GFR, Est African American: 68 mL/min/{1.73_m2} (ref >=60)
GFR, Est Non African American: 58 mL/min/{1.73_m2} — ABNORMAL LOW (ref >=60)
Globulin: 2.5 g/dL (calc) (ref 1.9–3.7)
Glucose, Bld: 88 mg/dL (ref 65–99)
Potassium: 4.2 mmol/L (ref 3.5–5.3)
Sodium: 143 mmol/L (ref 135–146)
Total Bilirubin: 0.6 mg/dL (ref 0.2–1.2)
Total Protein: 6.6 g/dL (ref 6.1–8.1)

## 2020-04-24 LAB — URINE CULTURE
MICRO NUMBER:: 10550263
Result:: NO GROWTH
SPECIMEN QUALITY:: ADEQUATE

## 2020-04-24 LAB — URINALYSIS, ROUTINE W REFLEX MICROSCOPIC
Bilirubin Urine: NEGATIVE
Glucose, UA: NEGATIVE
Hgb urine dipstick: NEGATIVE
Ketones, ur: NEGATIVE
Leukocytes,Ua: NEGATIVE
Nitrite: NEGATIVE
Protein, ur: NEGATIVE
Specific Gravity, Urine: 1.011 (ref 1.001–1.03)
pH: 5.5 (ref 5.0–8.0)

## 2020-05-04 ENCOUNTER — Encounter: Payer: Self-pay | Admitting: Podiatry

## 2020-05-04 ENCOUNTER — Other Ambulatory Visit: Payer: Self-pay

## 2020-05-04 ENCOUNTER — Ambulatory Visit: Payer: PPO | Admitting: Podiatry

## 2020-05-04 DIAGNOSIS — E1142 Type 2 diabetes mellitus with diabetic polyneuropathy: Secondary | ICD-10-CM | POA: Diagnosis not present

## 2020-05-04 DIAGNOSIS — L84 Corns and callosities: Secondary | ICD-10-CM | POA: Diagnosis not present

## 2020-05-04 DIAGNOSIS — B351 Tinea unguium: Secondary | ICD-10-CM | POA: Diagnosis not present

## 2020-05-04 DIAGNOSIS — M79675 Pain in left toe(s): Secondary | ICD-10-CM

## 2020-05-04 DIAGNOSIS — M79674 Pain in right toe(s): Secondary | ICD-10-CM

## 2020-05-04 DIAGNOSIS — L600 Ingrowing nail: Secondary | ICD-10-CM

## 2020-05-04 NOTE — Patient Instructions (Signed)
Diabetes Mellitus and Foot Care Foot care is an important part of your health, especially when you have diabetes. Diabetes may cause you to have problems because of poor blood flow (circulation) to your feet and legs, which can cause your skin to:  Become thinner and drier.  Break more easily.  Heal more slowly.  Peel and crack. You may also have nerve damage (neuropathy) in your legs and feet, causing decreased feeling in them. This means that you may not notice minor injuries to your feet that could lead to more serious problems. Noticing and addressing any potential problems early is the best way to prevent future foot problems. How to care for your feet Foot hygiene  Wash your feet daily with warm water and mild soap. Do not use hot water. Then, pat your feet and the areas between your toes until they are completely dry. Do not soak your feet as this can dry your skin.  Trim your toenails straight across. Do not dig under them or around the cuticle. File the edges of your nails with an emery board or nail file.  Apply a moisturizing lotion or petroleum jelly to the skin on your feet and to dry, brittle toenails. Use lotion that does not contain alcohol and is unscented. Do not apply lotion between your toes. Shoes and socks  Wear clean socks or stockings every day. Make sure they are not too tight. Do not wear knee-high stockings since they may decrease blood flow to your legs.  Wear shoes that fit properly and have enough cushioning. Always look in your shoes before you put them on to be sure there are no objects inside.  To break in new shoes, wear them for just a few hours a day. This prevents injuries on your feet. Wounds, scrapes, corns, and calluses  Check your feet daily for blisters, cuts, bruises, sores, and redness. If you cannot see the bottom of your feet, use a mirror or ask someone for help.  Do not cut corns or calluses or try to remove them with medicine.  If you  find a minor scrape, cut, or break in the skin on your feet, keep it and the skin around it clean and dry. You may clean these areas with mild soap and water. Do not clean the area with peroxide, alcohol, or iodine.  If you have a wound, scrape, corn, or callus on your foot, look at it several times a day to make sure it is healing and not infected. Check for: ? Redness, swelling, or pain. ? Fluid or blood. ? Warmth. ? Pus or a bad smell. General instructions  Do not cross your legs. This may decrease blood flow to your feet.  Do not use heating pads or hot water bottles on your feet. They may burn your skin. If you have lost feeling in your feet or legs, you may not know this is happening until it is too late.  Protect your feet from hot and cold by wearing shoes, such as at the beach or on hot pavement.  Schedule a complete foot exam at least once a year (annually) or more often if you have foot problems. If you have foot problems, report any cuts, sores, or bruises to your health care provider immediately. Contact a health care provider if:  You have a medical condition that increases your risk of infection and you have any cuts, sores, or bruises on your feet.  You have an injury that is not   healing.  You have redness on your legs or feet.  You feel burning or tingling in your legs or feet.  You have pain or cramps in your legs and feet.  Your legs or feet are numb.  Your feet always feel cold.  You have pain around a toenail. Get help right away if:  You have a wound, scrape, corn, or callus on your foot and: ? You have pain, swelling, or redness that gets worse. ? You have fluid or blood coming from the wound, scrape, corn, or callus. ? Your wound, scrape, corn, or callus feels warm to the touch. ? You have pus or a bad smell coming from the wound, scrape, corn, or callus. ? You have a fever. ? You have a red line going up your leg. Summary  Check your feet every day  for cuts, sores, red spots, swelling, and blisters.  Moisturize feet and legs daily.  Wear shoes that fit properly and have enough cushioning.  If you have foot problems, report any cuts, sores, or bruises to your health care provider immediately.  Schedule a complete foot exam at least once a year (annually) or more often if you have foot problems. This information is not intended to replace advice given to you by your health care provider. Make sure you discuss any questions you have with your health care provider. Document Revised: 07/31/2019 Document Reviewed: 12/09/2016 Elsevier Patient Education  2020 [employer]  

## 2020-05-06 NOTE — Progress Notes (Signed)
History of Present Illness:       This very nice 74 y.o. WWF  presents for 3 month follow up with HTN, HLD, Pre-Diabetes and Vitamin D Deficiency.       Patient is monitored expectantly for labile HTN & BP has been controlled at home. Today's BP is at goal - 106/64. In Dec 2020, she had a Cardiac CT which was essential Negative. Patient has had no complaints of any cardiac type chest pain, palpitations, dyspnea / orthopnea / PND, dizziness, claudication, or dependent edema.      Hyperlipidemia is controlled with diet & Atorvastatin / Ezetamibe. Patient denies myalgias or other med SE's. Last Lipids were at goal except elevated trig's:  Lab Results  Component Value Date   CHOL 158 01/23/2020   HDL 39 (L) 01/23/2020   LDLCALC 90 01/23/2020   TRIG 203 (H) 01/23/2020   CHOLHDL 4.1 01/23/2020    Also, the patient has Morbid Obesity (BMI 37.5)andhistory of T2_NIDDM managed with dietand has had no symptoms of reactive hypoglycemia, diabetic polys, paresthesias or visual blurring.  Last A1c was near goal:  Lab Results  Component Value Date   HGBA1C 5.9 (H) 01/23/2020             Patient has been on Thyroid Replacement.  Further, the patient also has history of Vitamin D Deficiency and supplements vitamin D without any suspected side-effects. Last vitamin D was still not at goal:  Lab Results  Component Value Date   VD25OH 50 10/10/2019    Current Outpatient Medications on File Prior to Visit  Medication Sig  . Acetaminophen (TYLENOL ARTHRITIS PAIN PO) Take by mouth.  Marland Kitchen aspirin 81 MG tablet Take 81 mg by mouth daily.  Marland Kitchen atorvastatin (LIPITOR) 40 MG tablet Take 1 tablet Daily  for Cholesterol  . b complex vitamins tablet Take 1 tablet by mouth daily.  . diclofenac sodium (VOLTAREN) 1 % GEL Apply 4 g topically 4 (four) times daily.  . dorzolamide-timolol (COSOPT) 22.3-6.8 MG/ML ophthalmic solution USE 1 DROP INTO THE AFFECTED EYE 2 TIMES DAILY  . ezetimibe (ZETIA) 10 MG tablet  Take 1 tablet (10 mg total) by mouth daily.  . famotidine (PEPCID) 40 MG tablet Take 1 tablet at Bedtime for Indigestion & Heartburn  . furosemide (LASIX) 20 MG tablet Take 1 tablet 2 x /day for BP & Fluid Retention  . gabapentin (NEURONTIN) 800 MG tablet Take 1 tablet by mouth 3 times a day  . levothyroxine (SYNTHROID) 75 MCG tablet Take 1 to 1.5 tablet daily as directed on an empty stomach with only water for 30 minutes & No Antacid meds, Calcium or Magnesium for 4 hrs  . meloxicam (MOBIC) 15 MG tablet Take one daily with food for 2 weeks, can take with tylenol, can not take with aleve, iburpofen, then as needed daily for pain  . Multiple Vitamin (MULTIVITAMIN) tablet Take 1 tablet by mouth daily.  . pantoprazole (PROTONIX) 40 MG tablet Take 1 tablet (40 mg total) by mouth daily.  . Probiotic CAPS Take by mouth.  . verapamil (CALAN) 80 MG tablet TAKE AS NEEDED FOR PALPITATIONS UP TO 3 X A DAY  . dicyclomine (BENTYL) 20 MG tablet Take 1 tablet (20 mg total) by mouth 3 (three) times daily as needed for spasms.  . fluconazole (DIFLUCAN) 150 MG tablet Take 1 tablet (150 mg total) by mouth daily. (Patient not taking: Reported on 05/07/2020)  . nitroGLYCERIN (NITROSTAT) 0.4 MG SL tablet  Place 1 tablet (0.4 mg total) under the tongue every 5 (five) minutes as needed for chest pain.   Current Facility-Administered Medications on File Prior to Visit  Medication  . dexamethasone (DECADRON) injection 10 mg    Allergies  Allergen Reactions  . Ciprofloxacin   . Macrobid [Nitrofurantoin Macrocrystal]   . Penicillins     REACTION: rash  . Procaine Hcl     REACTION: difficulty breathing  . Sulfonamide Derivatives     REACTION: hives  . Tetanus Toxoid     REACTION: arm swells    PMHx:   Past Medical History:  Diagnosis Date  . Anemia   . Chest tightness   . Fatigue   . GERD (gastroesophageal reflux disease)   . Hyperlipidemia   . Hypothyroid   . Internal hemorrhoids 05/04/2009   Qualifier:  Diagnosis of  By: Nelson-Smith CMA (AAMA), Dottie    . Myalgia   . Obesity   . Prediabetes   . Ulcer   . Varicose veins     Immunization History  Administered Date(s) Administered  . Influenza, High Dose Seasonal PF 08/19/2014, 09/10/2015, 07/26/2016, 08/10/2017, 08/14/2018, 08/27/2019  . Pneumococcal Conjugate-13 05/12/2014  . Pneumococcal Polysaccharide-23 09/05/2007, 06/02/2015    Past Surgical History:  Procedure Laterality Date  . ABDOMINAL HYSTERECTOMY    . CATARACT EXTRACTION    . CYST REMOVAL HAND Left 07/30/2018  . NASAL SINUS SURGERY    . ROTATOR CUFF REPAIR Left more than 7 years ago   Dr. Percell Miller and Dr. Earleen Newport  . TONSILLECTOMY AND ADENOIDECTOMY      FHx:    Reviewed / unchanged  SHx:    Reviewed / unchanged   Systems Review:  Constitutional: Denies fever, chills, wt changes, headaches, insomnia, fatigue, night sweats, change in appetite. Eyes: Denies redness, blurred vision, diplopia, discharge, itchy, watery eyes.  ENT: Denies discharge, congestion, post nasal drip, epistaxis, sore throat, earache, hearing loss, dental pain, tinnitus, vertigo, sinus pain, snoring.  CV: Denies chest pain, palpitations, irregular heartbeat, syncope, dyspnea, diaphoresis, orthopnea, PND, claudication or edema. Respiratory: denies cough, dyspnea, DOE, pleurisy, hoarseness, laryngitis, wheezing.  Gastrointestinal: Denies dysphagia, odynophagia, heartburn, reflux, water brash, abdominal pain or cramps, nausea, vomiting, bloating, diarrhea, constipation, hematemesis, melena, hematochezia  or hemorrhoids. Genitourinary: Denies dysuria, frequency, urgency, nocturia, hesitancy, discharge, hematuria or flank pain. Musculoskeletal: Denies arthralgias, myalgias, stiffness, jt. swelling, pain, limping or strain/sprain.  Skin: Denies pruritus, rash, hives, warts, acne, eczema or change in skin lesion(s). Neuro: No weakness, tremor, incoordination, spasms, paresthesia or pain. Psychiatric:  Denies confusion, memory loss or sensory loss. Endo: Denies change in weight, skin or hair change.  Heme/Lymph: No excessive bleeding, bruising or enlarged lymph nodes.  Physical Exam  BP 106/64   Pulse 60   Temp (!) 97.4 F (36.3 C)   Resp 16   Ht 5\' 7"  (1.702 m)   Wt 224 lb 12.8 oz (102 kg)   BMI 35.21 kg/m   Appears  well nourished, well groomed  and in no distress.  Eyes: PERRLA, EOMs, conjunctiva no swelling or erythema. Sinuses: No frontal/maxillary tenderness ENT/Mouth: EAC's clear, TM's nl w/o erythema, bulging. Nares clear w/o erythema, swelling, exudates. Oropharynx clear without erythema or exudates. Oral hygiene is good. Tongue normal, non obstructing. Hearing intact.  Neck: Supple. Thyroid not palpable. Car 2+/2+ without bruits, nodes or JVD. Chest: Respirations nl with BS clear & equal w/o rales, rhonchi, wheezing or stridor.  Cor: Heart sounds normal w/ regular rate and rhythm without sig. murmurs, gallops,  clicks or rubs. Peripheral pulses normal and equal  without edema.  Abdomen: Soft & bowel sounds normal. Non-tender w/o guarding, rebound, hernias, masses or organomegaly.  Lymphatics: Unremarkable.  Musculoskeletal: Full ROM all peripheral extremities, joint stability, 5/5 strength and normal gait.  Skin: Warm, dry without exposed rashes, lesions or ecchymosis apparent.  Neuro: Cranial nerves intact, reflexes equal bilaterally. Sensory-motor testing grossly intact. Tendon reflexes grossly intact.  Pysch: Alert & oriented x 3.  Insight and judgement nl & appropriate. No ideations.  Assessment and Plan:  1. Labile hypertension  - Continue medication, monitor blood pressure at home.  - Continue DASH diet.  Reminder to go to the ER if any CP,  SOB, nausea, dizziness, severe HA, changes vision/speech.  - CBC with Differential/Platelet - COMPLETE METABOLIC PANEL WITH GFR - Magnesium - TSH  2. Hyperlipidemia associated with type 2 diabetes mellitus ([place])  -  Continue diet/meds, exercise,& lifestyle modifications.  - Continue monitor periodic cholesterol/liver & renal functions   - Lipid panel - TSH - Hemoglobin A1c  3. Type 2 diabetes mellitus with stage 2 chronic kidney disease, without long-term current use of insulin ([facility])  - Continue diet, exercise  - Lifestyle modifications.  - Monitor appropriate labs.  - Hemoglobin A1c - Insulin, random  4. Vitamin D deficiency  - Continue supplementation.  - VITAMIN D 25 Hydroxy   5. Hypothyroidism  - TSH  6. Medication management  - CBC with Differential/Platelet - COMPLETE METABOLIC PANEL WITH GFR - Magnesium - Lipid panel - TSH - Hemoglobin A1c - Insulin, random - VITAMIN D 25 Hydroxy         Discussed  regular exercise, BP monitoring, weight control to achieve/maintain BMI less than 25 and discussed med and SE's. Recommended labs to assess and monitor clinical status with further disposition pending results of labs.  I discussed the assessment and treatment plan with the patient. The patient was provided an opportunity to ask questions and all were answered. The patient agreed with the plan and demonstrated an understanding of the instructions.  I provided over 30 minutes of exam, counseling, chart review and  complex critical decision making.   Kirtland Bouchard, MD

## 2020-05-07 ENCOUNTER — Other Ambulatory Visit: Payer: Self-pay

## 2020-05-07 ENCOUNTER — Ambulatory Visit (INDEPENDENT_AMBULATORY_CARE_PROVIDER_SITE_OTHER): Payer: PPO | Admitting: Internal Medicine

## 2020-05-07 VITALS — BP 106/64 | HR 60 | Temp 97.4°F | Resp 16 | Ht 67.0 in | Wt 224.8 lb

## 2020-05-07 DIAGNOSIS — N182 Chronic kidney disease, stage 2 (mild): Secondary | ICD-10-CM | POA: Diagnosis not present

## 2020-05-07 DIAGNOSIS — B351 Tinea unguium: Secondary | ICD-10-CM

## 2020-05-07 DIAGNOSIS — E1122 Type 2 diabetes mellitus with diabetic chronic kidney disease: Secondary | ICD-10-CM | POA: Diagnosis not present

## 2020-05-07 DIAGNOSIS — E039 Hypothyroidism, unspecified: Secondary | ICD-10-CM | POA: Diagnosis not present

## 2020-05-07 DIAGNOSIS — E559 Vitamin D deficiency, unspecified: Secondary | ICD-10-CM | POA: Diagnosis not present

## 2020-05-07 DIAGNOSIS — E1169 Type 2 diabetes mellitus with other specified complication: Secondary | ICD-10-CM

## 2020-05-07 DIAGNOSIS — R0989 Other specified symptoms and signs involving the circulatory and respiratory systems: Secondary | ICD-10-CM | POA: Diagnosis not present

## 2020-05-07 DIAGNOSIS — E785 Hyperlipidemia, unspecified: Secondary | ICD-10-CM | POA: Diagnosis not present

## 2020-05-07 DIAGNOSIS — Z79899 Other long term (current) drug therapy: Secondary | ICD-10-CM | POA: Diagnosis not present

## 2020-05-07 MED ORDER — TERBINAFINE HCL 250 MG PO TABS: ORAL_TABLET | ORAL | 0 refills | Status: DC

## 2020-05-07 NOTE — Patient Instructions (Signed)

## 2020-05-08 LAB — COMPLETE METABOLIC PANEL WITH GFR
AG Ratio: 1.5 (calc) (ref 1.0–2.5)
ALT: 12 U/L (ref 6–29)
AST: 14 U/L (ref 10–35)
Albumin: 4 g/dL (ref 3.6–5.1)
Alkaline phosphatase (APISO): 95 U/L (ref 37–153)
BUN/Creatinine Ratio: 17 (calc) (ref 6–22)
BUN: 19 mg/dL (ref 7–25)
CO2: 31 mmol/L (ref 20–32)
Calcium: 9.4 mg/dL (ref 8.6–10.4)
Chloride: 104 mmol/L (ref 98–110)
Creat: 1.13 mg/dL — ABNORMAL HIGH (ref 0.60–0.93)
GFR, Est African American: 56 mL/min/{1.73_m2} — ABNORMAL LOW (ref >=60)
GFR, Est Non African American: 49 mL/min/{1.73_m2} — ABNORMAL LOW (ref >=60)
Globulin: 2.6 g/dL (calc) (ref 1.9–3.7)
Glucose, Bld: 90 mg/dL (ref 65–99)
Potassium: 4.4 mmol/L (ref 3.5–5.3)
Sodium: 141 mmol/L (ref 135–146)
Total Bilirubin: 0.7 mg/dL (ref 0.2–1.2)
Total Protein: 6.6 g/dL (ref 6.1–8.1)

## 2020-05-08 LAB — CBC WITH DIFFERENTIAL/PLATELET
Absolute Monocytes: 780 cells/uL (ref 200–950)
Basophils Absolute: 52 cells/uL (ref 0–200)
Basophils Relative: 0.5 %
Eosinophils Absolute: 239 cells/uL (ref 15–500)
Eosinophils Relative: 2.3 %
HCT: 42 % (ref 35.0–45.0)
Hemoglobin: 13.9 g/dL (ref 11.7–15.5)
Lymphs Abs: 2870 cells/uL (ref 850–3900)
MCH: 28.5 pg (ref 27.0–33.0)
MCHC: 33.1 g/dL (ref 32.0–36.0)
MCV: 86.1 fL (ref 80.0–100.0)
MPV: 10.9 fL (ref 7.5–12.5)
Monocytes Relative: 7.5 %
Neutro Abs: 6458 cells/uL (ref 1500–7800)
Neutrophils Relative %: 62.1 %
Platelets: 293 10*3/uL (ref 140–400)
RBC: 4.88 10*6/uL (ref 3.80–5.10)
RDW: 13.9 % (ref 11.0–15.0)
Total Lymphocyte: 27.6 %
WBC: 10.4 10*3/uL (ref 3.8–10.8)

## 2020-05-08 LAB — MAGNESIUM: Magnesium: 2.3 mg/dL (ref 1.5–2.5)

## 2020-05-08 LAB — LIPID PANEL
Cholesterol: 122 mg/dL (ref <200)
HDL: 41 mg/dL — ABNORMAL LOW (ref >=50)
LDL Cholesterol (Calc): 55 mg/dL (calc)
Non-HDL Cholesterol (Calc): 81 mg/dL (calc) (ref <130)
Total CHOL/HDL Ratio: 3 (calc) (ref <5.0)
Triglycerides: 192 mg/dL — ABNORMAL HIGH (ref <150)

## 2020-05-08 LAB — TSH: TSH: 0.59 mIU/L (ref 0.40–4.50)

## 2020-05-08 LAB — VITAMIN D 25 HYDROXY (VIT D DEFICIENCY, FRACTURES): Vit D, 25-Hydroxy: 56 ng/mL (ref 30–100)

## 2020-05-08 LAB — HEMOGLOBIN A1C
Hgb A1c MFr Bld: 5.9 % of total Hgb — ABNORMAL HIGH (ref <5.7)
Mean Plasma Glucose: 123 (calc)
eAG (mmol/L): 6.8 (calc)

## 2020-05-08 LAB — INSULIN, RANDOM: Insulin: 23 u[IU]/mL — ABNORMAL HIGH

## 2020-05-08 NOTE — Progress Notes (Signed)
==============================================================  -   Kidney functions appear mildly dehydrated, So suggest cut your Lasix / Furosemide down to 1 x /day & be sure drinking lots of fluids / liquids - at least 6 bottles (16 oz) / day  ==============================================================  - Total Chol = 122 and LDL Chol = 55 - Both Excellent   - Very low risk for Heart Attack / Stroke ==============================================================  - A1c = 5.9% - still elevated in the borderline and  early or pre-diabetes range which has the same   300% increased risk for heart attack, stroke, cancer and   alzheimer- type vascular dementia as full blown diabetes.   But the good news is that diet, exercise with  weight loss can cure the early diabetes at this point. ==============================================================  It is very important that you work harder with diet by  avoiding all foods that are white except chicken,  fish & calliflower.  - Avoid white rice  (brown & wild rice is OK),   - Avoid white potatoes  (sweet potatoes in moderation is OK),   White bread or wheat bread or anything made out of   white flour like bagels, donuts, rolls, buns, biscuits, cakes,  - pastries, cookies, pizza crust, and pasta (made from  white flour & egg whites)   - vegetarian pasta or spinach or wheat pasta is OK.  - Multigrain breads like Arnold's, Pepperidge Farm or   multigrain sandwich thins or high fiber breads like   [place] bread or "Dave's Killer" breads that are  4 to 5 grams fiber per slice ! are best.   Diet, exercise and weight loss can reverse and cure  diabetes in the early stages.  ==============================================================  - Vitamin D = 56 - slightly low   - Vitamin D goal is between 70-100.  ------------------------------------------------------------------------------------- -  Recommend take an extra 2,000 units of Vitamin D /day ------------------------------------------------------------------------------------- - It is very important as a natural anti-inflammatory and helping the  immune system protect against viral infections, like the Covid-19    helping hair, skin, and nails, as well as reducing stroke and heart attack risk.   - It helps your bones and helps with mood.  - It also decreases numerous cancer risks so please take it as directed.   - Low Vit D is associated with a 200-300% higher risk for CANCER   and 200-300% higher risk for HEART  ATTACK & STROKE.   - It is also associated with higher death rate at younger ages,   autoimmune diseases like Rheumatoid arthritis, Lupus, Multiple Sclerosis.    - Also many other serious conditions, like depression, Alzheimer's  Dementia, infertility, muscle aches, fatigue, fibromyalgia - just to name a few.  =========================================================  - All Else - CBC - Electrolytes - Liver - Magnesium & Thyroid - all Normal / OK =============================================================

## 2020-05-09 ENCOUNTER — Encounter: Payer: Self-pay | Admitting: Internal Medicine

## 2020-05-10 NOTE — Progress Notes (Signed)
Subjective: [name] presents today at risk foot care. Pt has h/o NIDDM with chronic kidney disease and painful corn(s) b/l and callus(es) b/l and painful mycotic nails b/l.  Pain interferes with ambulation. Aggravating factors include wearing enclosed shoe gear.  Unk Pinto, MD is patient's PCP. Last visit was: 05/07/2020.  She relates corns are really bothering her on today's visit.   Past Medical History:  Diagnosis Date  . Anemia   . Chest tightness   . Fatigue   . GERD (gastroesophageal reflux disease)   . Hyperlipidemia   . Hypothyroid   . Internal hemorrhoids 05/04/2009   Qualifier: Diagnosis of  By: Nelson-Smith CMA (AAMA), Dottie    . Myalgia   . Obesity   . Prediabetes   . Ulcer   . Varicose veins      Current Outpatient Medications on File Prior to Visit  Medication Sig Dispense Refill  . Acetaminophen (TYLENOL ARTHRITIS PAIN PO) Take by mouth.    Marland Kitchen aspirin 81 MG tablet Take 81 mg by mouth daily.    Marland Kitchen atorvastatin (LIPITOR) 40 MG tablet Take 1 tablet Daily  for Cholesterol 90 tablet 3  . b complex vitamins tablet Take 1 tablet by mouth daily.    . diclofenac sodium (VOLTAREN) 1 % GEL Apply 4 g topically 4 (four) times daily. 100 g 3  . dicyclomine (BENTYL) 20 MG tablet Take 1 tablet (20 mg total) by mouth 3 (three) times daily as needed for spasms. 270 tablet 0  . dorzolamide-timolol (COSOPT) 22.3-6.8 MG/ML ophthalmic solution USE 1 DROP INTO THE AFFECTED EYE 2 TIMES DAILY  1  . ezetimibe (ZETIA) 10 MG tablet Take 1 tablet (10 mg total) by mouth daily. 90 tablet 3  . famotidine (PEPCID) 40 MG tablet Take 1 tablet at Bedtime for Indigestion & Heartburn 90 tablet 3  . fluconazole (DIFLUCAN) 150 MG tablet Take 1 tablet (150 mg total) by mouth daily. (Patient not taking: Reported on 05/07/2020) 1 tablet 3  . furosemide (LASIX) 20 MG tablet Take 1 tablet 2 x /day for BP & Fluid Retention 180 tablet 3  . gabapentin (NEURONTIN) 800 MG tablet Take 1 tablet by mouth 3  times a day 270 tablet 1  . levothyroxine (SYNTHROID) 75 MCG tablet Take 1 to 1.5 tablet daily as directed on an empty stomach with only water for 30 minutes & No Antacid meds, Calcium or Magnesium for 4 hrs 114 tablet 3  . meloxicam (MOBIC) 15 MG tablet Take one daily with food for 2 weeks, can take with tylenol, can not take with aleve, iburpofen, then as needed daily for pain 30 tablet 1  . Multiple Vitamin (MULTIVITAMIN) tablet Take 1 tablet by mouth daily.    . nitroGLYCERIN (NITROSTAT) 0.4 MG SL tablet Place 1 tablet (0.4 mg total) under the tongue every 5 (five) minutes as needed for chest pain. 90 tablet 3  . pantoprazole (PROTONIX) 40 MG tablet Take 1 tablet (40 mg total) by mouth daily. 90 tablet 1  . Probiotic CAPS Take by mouth.    . verapamil (CALAN) 80 MG tablet TAKE AS NEEDED FOR PALPITATIONS UP TO 3 X A DAY 270 tablet 1   Current Facility-Administered Medications on File Prior to Visit  Medication Dose Route Frequency Provider Last Rate Last Admin  . dexamethasone (DECADRON) injection 10 mg  10 mg Intramuscular Once Vicie Mutters, PA-C         Allergies  Allergen Reactions  . Ciprofloxacin   . Macrobid [  Nitrofurantoin Macrocrystal]   . Penicillins     REACTION: rash  . Procaine Hcl     REACTION: difficulty breathing  . Sulfonamide Derivatives     REACTION: hives  . Tetanus Toxoid     REACTION: arm swells    Objective: [name] is a pleasant 74 y.o. Caucasian female in NAD. AAO x 3.  There were no vitals filed for this visit.  Vascular Examination: Neurovascular status unchanged b/l lower extremities. Capillary fill time to digits <3 seconds b/l lower extremities. Palpable pedal pulses b/l LE. Pedal hair absent. Lower extremity skin temperature gradient within normal limits.  Dermatological Examination: Pedal skin with normal turgor, texture and tone bilaterally. No open wounds bilaterally. No interdigital macerations bilaterally. Toenails 1-5 b/l  elongated, discolored, dystrophic, thickened, crumbly with subungual debris and tenderness to dorsal palpation. Hyperkeratotic lesion(s) L hallux, L 2nd toe, R hallux, R 2nd toe, R 3rd toe and submet head 5 left foot.  No erythema, no edema, no drainage, no flocculence.  Musculoskeletal: Normal muscle strength 5/5 to all lower extremity muscle groups bilaterally. No pain crepitus or joint limitation noted with ROM b/l. Hammertoes noted to the 1-5 bilaterally.  Neurological Examination: Protective sensation diminished with 10g monofilament b/l.  Last A1c: Hemoglobin A1C Latest Ref Rng & Units 05/07/2020 01/23/2020 10/10/2019 07/10/2019  HGBA1C <5.7 % of total Hgb 5.9(H) 5.9(H) 5.8(H) 6.0(H)  Some recent data might be hidden    Assessment: 1. Pain due to onychomycosis of toenails of both feet   2. Ingrown toenail without infection   3. Corns and callosities   4. Diabetic peripheral neuropathy associated with type 2 diabetes mellitus ([place])   Plan: -Examined patient. -Continue diabetic foot care principles. -Toenails 1-5 b/l were debrided in length and girth with sterile nail nippers and dremel without iatrogenic bleeding.  -Offending nail border debrided and curretaged L hallux and R hallux utilizing sterile nail nipper and currette. Border(s) cleansed with alcohol and triple antibiotic ointment applied. Patient instructed to apply Neosporin to L hallux and R hallux once daily for 7 days. -Corn(s) L 2nd toe, R 2nd toe and R 3rd toe and callus(es) L hallux, R hallux and submet head 5 left foot were pared utilizing sterile scalpel blade without incident. Total number debrided =6. -Patient to report any pedal injuries to medical professional immediately. -Patient to continue soft, supportive shoe gear daily. -Patient/POA to call should there be question/concern in the interim.  Return in about 9 weeks (around 07/06/2020) for diabetic nail and callus trim.  Marzetta Board, DPM

## 2020-05-11 ENCOUNTER — Other Ambulatory Visit: Payer: Self-pay

## 2020-05-11 DIAGNOSIS — K219 Gastro-esophageal reflux disease without esophagitis: Secondary | ICD-10-CM

## 2020-05-11 LAB — HM DIABETES EYE EXAM

## 2020-05-11 MED ORDER — LEVOTHYROXINE SODIUM 75 MCG PO TABS: ORAL_TABLET | ORAL | 3 refills | Status: DC

## 2020-05-11 MED ORDER — PANTOPRAZOLE SODIUM 40 MG PO TBEC: 40.0000 mg | DELAYED_RELEASE_TABLET | Freq: Every day | ORAL | 1 refills | Status: DC

## 2020-06-16 ENCOUNTER — Ambulatory Visit: Payer: PPO

## 2020-06-18 ENCOUNTER — Ambulatory Visit: Payer: PPO

## 2020-06-30 ENCOUNTER — Ambulatory Visit: Payer: PPO

## 2020-06-30 ENCOUNTER — Ambulatory Visit: Payer: PPO | Admitting: Podiatry

## 2020-07-01 ENCOUNTER — Ambulatory Visit: Payer: PPO | Admitting: Physician Assistant

## 2020-07-01 DIAGNOSIS — J069 Acute upper respiratory infection, unspecified: Secondary | ICD-10-CM | POA: Diagnosis not present

## 2020-07-01 DIAGNOSIS — Z1152 Encounter for screening for COVID-19: Secondary | ICD-10-CM | POA: Diagnosis not present

## 2020-07-01 DIAGNOSIS — Z03818 Encounter for observation for suspected exposure to other biological agents ruled out: Secondary | ICD-10-CM | POA: Diagnosis not present

## 2020-07-08 ENCOUNTER — Ambulatory Visit: Payer: PPO | Admitting: Cardiology

## 2020-07-10 ENCOUNTER — Other Ambulatory Visit: Payer: Self-pay | Admitting: *Deleted

## 2020-07-10 DIAGNOSIS — E785 Hyperlipidemia, unspecified: Secondary | ICD-10-CM

## 2020-07-10 DIAGNOSIS — I10 Essential (primary) hypertension: Secondary | ICD-10-CM

## 2020-07-10 MED ORDER — FUROSEMIDE 20 MG PO TABS: ORAL_TABLET | ORAL | 3 refills | Status: DC

## 2020-07-10 MED ORDER — ATORVASTATIN CALCIUM 40 MG PO TABS: ORAL_TABLET | ORAL | 3 refills | Status: DC

## 2020-07-28 ENCOUNTER — Encounter: Payer: Self-pay | Admitting: Gastroenterology

## 2020-08-06 ENCOUNTER — Ambulatory Visit: Payer: PPO | Admitting: Podiatry

## 2020-08-06 ENCOUNTER — Ambulatory Visit: Payer: PPO

## 2020-08-06 ENCOUNTER — Encounter: Payer: Self-pay | Admitting: Podiatry

## 2020-08-06 ENCOUNTER — Other Ambulatory Visit: Payer: Self-pay

## 2020-08-06 DIAGNOSIS — B351 Tinea unguium: Secondary | ICD-10-CM | POA: Diagnosis not present

## 2020-08-06 DIAGNOSIS — M79675 Pain in left toe(s): Secondary | ICD-10-CM

## 2020-08-06 DIAGNOSIS — M79674 Pain in right toe(s): Secondary | ICD-10-CM

## 2020-08-06 DIAGNOSIS — M7752 Other enthesopathy of left foot: Secondary | ICD-10-CM

## 2020-08-06 DIAGNOSIS — E1142 Type 2 diabetes mellitus with diabetic polyneuropathy: Secondary | ICD-10-CM

## 2020-08-06 DIAGNOSIS — L97511 Non-pressure chronic ulcer of other part of right foot limited to breakdown of skin: Secondary | ICD-10-CM | POA: Diagnosis not present

## 2020-08-06 DIAGNOSIS — L84 Corns and callosities: Secondary | ICD-10-CM | POA: Diagnosis not present

## 2020-08-06 MED ORDER — MUPIROCIN 2 % EX OINT: TOPICAL_OINTMENT | CUTANEOUS | 1 refills | Status: DC

## 2020-08-06 NOTE — Progress Notes (Signed)
She presents today for chief complaint of painful toe second left due to long nail and callus.  She is also complaining of painful elongated toenails multiple Callosities.  Objective: Vital signs are stable alert oriented x3 pulses are palpable.  There is no erythema with exception of the dorsal aspect of the second toe at the proximal nail fold.  Otherwise toenails are long thick yellow dystrophic-like mycotic reactive hyperkeratotic lesion distal aspect of mallet toe hallux right resulting in reactive hyperkeratotic lesion and some hematoma present.  Assessment: Painful hammertoe deformities distal clavi and painful toenails.  Mild paronychia proximal toe second left.  Plan: Discussed etiology pathology and surgical therapies at this point in time debrided all of her nails debrided all of the callosities no open lesions or wounds.  Recommend she start Bactroban ointment which I sent to her pharmacy and Dr. Adah Perl will follow up with her in about 2 months

## 2020-08-10 ENCOUNTER — Ambulatory Visit: Payer: PPO | Admitting: Podiatry

## 2020-08-11 ENCOUNTER — Ambulatory Visit: Payer: PPO | Admitting: Cardiology

## 2020-09-08 ENCOUNTER — Ambulatory Visit: Payer: PPO | Admitting: Cardiology

## 2020-09-10 ENCOUNTER — Other Ambulatory Visit: Payer: Self-pay

## 2020-09-10 ENCOUNTER — Ambulatory Visit (INDEPENDENT_AMBULATORY_CARE_PROVIDER_SITE_OTHER): Payer: PPO

## 2020-09-10 VITALS — Temp 97.7°F | Wt 222.0 lb

## 2020-09-10 DIAGNOSIS — Z23 Encounter for immunization: Secondary | ICD-10-CM

## 2020-09-10 NOTE — Progress Notes (Signed)
PATIENT reports for HD flu vaccine given in lt arm.

## 2020-09-14 DIAGNOSIS — H40013 Open angle with borderline findings, low risk, bilateral: Secondary | ICD-10-CM | POA: Diagnosis not present

## 2020-09-28 DIAGNOSIS — Z85828 Personal history of other malignant neoplasm of skin: Secondary | ICD-10-CM | POA: Diagnosis not present

## 2020-09-28 DIAGNOSIS — D225 Melanocytic nevi of trunk: Secondary | ICD-10-CM | POA: Diagnosis not present

## 2020-09-28 DIAGNOSIS — L821 Other seborrheic keratosis: Secondary | ICD-10-CM | POA: Diagnosis not present

## 2020-09-28 DIAGNOSIS — L905 Scar conditions and fibrosis of skin: Secondary | ICD-10-CM | POA: Diagnosis not present

## 2020-09-28 DIAGNOSIS — Z872 Personal history of diseases of the skin and subcutaneous tissue: Secondary | ICD-10-CM | POA: Diagnosis not present

## 2020-09-28 DIAGNOSIS — D1801 Hemangioma of skin and subcutaneous tissue: Secondary | ICD-10-CM | POA: Diagnosis not present

## 2020-09-28 DIAGNOSIS — L814 Other melanin hyperpigmentation: Secondary | ICD-10-CM | POA: Diagnosis not present

## 2020-10-07 ENCOUNTER — Encounter: Payer: PPO | Admitting: Gastroenterology

## 2020-10-09 ENCOUNTER — Encounter: Payer: Self-pay | Admitting: Adult Health

## 2020-10-09 ENCOUNTER — Ambulatory Visit: Payer: PPO | Admitting: Cardiology

## 2020-10-09 NOTE — Progress Notes (Signed)
CPE  Assessment and Plan:   Encounter for general adult medical examination with abnormal findings 1 year Schedule colonoscopy Requested diabetic eye exam report Schedule covid 19 with [facility]  Controlled type 2 diabetes mellitus with complication, without long-term current use of insulin ([place]) Discussed general issues about diabetes pathophysiology and management., Educational material distributed., Suggested low cholesterol diet., Encouraged aerobic exercise., Discussed foot care., Reminded to get yearly retinal exam. -     Hemoglobin A1c ([facility])  Hyperlipidemia associated with T2DM (HCC) check lipids, statin/zetia for LDL goal <70 decrease fatty foods increase activity.  -    Lipid panel   CKD stage 3 secondary to diabetes (HCC) Increase fluids, avoid NSAIDS, monitor sugars, will monitor -    CMP/GFR -    UA/microalbumin  Atherosclerosis of aorta (HCC) Control blood pressure, cholesterol, glucose, increase exercise.   CAD Cardiology following; defer EKG to them Control blood pressure, cholesterol, glucose, increase exercise.  Go to the ER if any chest pain, shortness of breath, nausea, dizziness.  Morbid obesity ([place]) - follow up 3 months for progress monitoring - increase veggies, decrease carbs - long discussion about weight loss, diet, and exercise  Labile hypertension - continue medications, DASH diet, exercise and monitor at home. Call if greater than 130/80.  -     CBC with Diff -     COMPLETE METABOLIC PANEL WITH GFR -     Urinalysis, Routine w reflex microscopic -     Microalbumin / Creatinine Urine Ratio  Hypothyroidism, unspecified type -     TSH Hypothyroidism-check TSH level, continue medications the same, reminded to take on an empty stomach 30-2mins before food.   Medication management -     Magnesium  Vitamin D deficiency -     Vitamin D (25 hydroxy)  History of tobacco use -23 pack year Monitor, had benign CT 12/2019  Chronic  venous insufficiency - weight loss discussed, continue compression stockings and elevation  History of basal cell carcinoma (BCC) Follow up derm  Gastroesophageal reflux disease with esophagitis without hemorrhage Continue PPI/H2 blocker, diet discussed  Diverticulosis of large intestine without hemorrhage Bowel management, soluble fiber, monitor   Peripheral autonomic neuropathy of unknown cause Monitor  Primary osteoarthritis of knee, unspecified laterality Ortho following, monitoring at this time   Orders Placed This Encounter  Procedures  . CBC with Differential/Platelet  . COMPLETE METABOLIC PANEL WITH GFR  . Magnesium  . Lipid panel  . TSH  . Hemoglobin A1c  . Microalbumin / creatinine urine ratio  . Urinalysis, Routine w reflex microscopic  . Vitamin B12  . HM DIABETES FOOT EXAM     Continue diet and meds as discussed. Further disposition pending results of labs. Over 30 minutes of exam, counseling, chart review, and critical decision making was performed  Future Appointments  Date Time Provider [place]  10/30/2020  1:30 PM Marzetta Board, DPM [facility]  11/02/2020  3:20 PM Buford Dresser, MD [facility] [place]  01/27/2021  2:30 PM Garnet Sierras, NP [facility] None  10/12/2021  2:00 PM Liane Comber, NP [facility] None    HPI 74 y.o. female  presents for CPE. She has Diverticulosis of large intestine; History of tobacco use; Hypothyroidism; Controlled type 2 diabetes mellitus with complication, without long-term current use of insulin ([place]); Morbid obesity ([place]); Chronic venous insufficiency; Peripheral autonomic neuropathy of unknown cause; Labile hypertension; History of basal cell carcinoma (BCC); Osteoarthritis of knee; Atherosclerosis of aorta ([place]); Vitamin D deficiency; Medication management; Gastroesophageal  reflux disease with esophagitis; Type 2 diabetes mellitus with hyperlipidemia ([place]); CKD stage 3 secondary to  diabetes ([place]); Family history of heart disease; and CAD (coronary artery disease) on their problem list.  She is widowed, 3 grandchildren, 1 great grandson - 79 months.  She is retired from [employer] -   She had covid 76 in August, has noted some hair loss and dry skin in the last few months.   GERD controlled on protonix and famotidine.   She is former smoker - 23.5 pack year hisotry, quit 2015, strong family hx of llung cancer, she had CT chest for coronary in 12/26/2019 which showed benign appearing nodules.  BMI is Body mass index is 34.46 kg/m., she is working on diet and exercise, trying to watch what she eats and smaller portions. Down from 246 lb in 07/2018.  Wt Readings from Last 3 Encounters:  10/12/20 220 lb (99.8 kg)  09/10/20 222 lb (100.7 kg)  05/07/20 224 lb 12.8 oz (102 kg)   She is following with Dr. Andree Elk for CAD; in 01/2020 she had cardiac CT showing CCS of 693, 78th % for age/gender, Calcified plaque causing moderate stenosis in the prox LAD, distal RCA, proximal LCx, moderate stenosis, and also aortic atherosclerosis   Her blood pressure has been controlled at home, has been good, today their BP is BP: 126/72  She does workout. She denies chest pain, shortness of breath, dizziness.  She has been working on diet and exercise for Diabetes, doing lifestyle changes  with diabetic chronic kidney disease she is on ACE/ARB With hyperlipidemia IS at goal less than 70, she is on lipitor 40 mg and zetia 10 mg  she is on bASA she has idiopathic paraesthsias and is on gabapentin- follows with Dr. Adah Perl every 90 days, did have infection/ulcer of left second toe in the past, no recent issues, checks feet daily  Eye exam Dr. Nicki Reaper - saw recently, report requested  denies polydipsia, polyuria and visual disturbances.   Last A1C was:  Lab Results  Component Value Date   HGBA1C 5.9 (H) 05/07/2020   Lab Results  Component Value Date   GFRNONAA 49 (L) 05/07/2020    Lab Results  Component Value Date   CHOL 122 05/07/2020   HDL 41 (L) 05/07/2020   LDLCALC 55 05/07/2020   TRIG 192 (H) 05/07/2020   CHOLHDL 3.0 05/07/2020   Patient is on Vitamin D supplement.   Lab Results  Component Value Date   VD25OH 56 05/07/2020     She is on thyroid medication. Her medication was not changed last visit. 75 mcg 1.5 tabs 4 days a week, 1 tab 3 days a week, takes on empty stomach with water only.   Lab Results  Component Value Date   TSH 0.59 05/07/2020  .     Current Medications:   Current Outpatient Medications (Endocrine & Metabolic):  .  levothyroxine (SYNTHROID) 75 MCG tablet, Take 1 to 1.5 tablet daily as directed on an empty stomach with only water for 30 minutes & No Antacid meds, Calcium or Magnesium for 4 hrs  Current Outpatient Medications (Cardiovascular):  .  atorvastatin (LIPITOR) 40 MG tablet, Take 1 tablet Daily  for Cholesterol .  ezetimibe (ZETIA) 10 MG tablet, Take 1 tablet (10 mg total) by mouth daily. .  furosemide (LASIX) 20 MG tablet, Take 1 tablet 2 x /day for BP & Fluid Retention .  verapamil (CALAN) 80 MG tablet, TAKE AS NEEDED FOR PALPITATIONS UP TO  3 X A DAY .  nitroGLYCERIN (NITROSTAT) 0.4 MG SL tablet, Place 1 tablet (0.4 mg total) under the tongue every 5 (five) minutes as needed for chest pain.   Current Outpatient Medications (Analgesics):  Marland Kitchen  Acetaminophen (TYLENOL ARTHRITIS PAIN PO), Take by mouth. Marland Kitchen  aspirin 81 MG tablet, Take 81 mg by mouth daily. .  meloxicam (MOBIC) 15 MG tablet, Take one daily with food for 2 weeks, can take with tylenol, can not take with aleve, iburpofen, then as needed daily for pain   Current Outpatient Medications (Other):  .  b complex vitamins tablet, Take 1 tablet by mouth daily. .  diclofenac sodium (VOLTAREN) 1 % GEL, Apply 4 g topically 4 (four) times daily. .  dorzolamide-timolol (COSOPT) 22.3-6.8 MG/ML ophthalmic solution, USE 1 DROP INTO THE AFFECTED EYE 2 TIMES DAILY .   famotidine (PEPCID) 40 MG tablet, Take 1 tablet at Bedtime for Indigestion & Heartburn .  gabapentin (NEURONTIN) 800 MG tablet, Take 1 tablet by mouth 3 times a day .  Multiple Vitamin (MULTIVITAMIN) tablet, Take 1 tablet by mouth daily. .  mupirocin ointment (BACTROBAN) 2 %, Apply to wound after soaking BID .  pantoprazole (PROTONIX) 40 MG tablet, Take 1 tablet (40 mg total) by mouth daily. Marland Kitchen  dicyclomine (BENTYL) 20 MG tablet, Take 1 tablet (20 mg total) by mouth 3 (three) times daily as needed for spasms.  Medical History:  Past Medical History:  Diagnosis Date  . Anemia   . Chest tightness   . CKD stage 3 secondary to diabetes ([place]) 10/09/2019  . Fatigue   . GERD (gastroesophageal reflux disease)   . Heart palpitations 12/06/2019  . Hyperlipidemia   . Hypothyroid   . Internal hemorrhoids 05/04/2009   Qualifier: Diagnosis of  By: Nelson-Smith CMA (AAMA), Dottie    . Myalgia   . Obesity   . Prediabetes   . Ulcer   . Varicose veins    Health Maintenance:   Immunization History  Administered Date(s) Administered  . Influenza, High Dose Seasonal PF 08/19/2014, 09/10/2015, 07/26/2016, 08/10/2017, 08/14/2018, 08/27/2019, 09/10/2020  . Pneumococcal Conjugate-13 05/12/2014  . Pneumococcal Polysaccharide-23 09/05/2007, 06/02/2015   Preventative care: Last colonoscopy: 2011, Due 2021, Dr. Trinna Balloon, scheduled but rescheduled, doesn't want before the holidays, has number to call   Last mammogram: 10/2019 - number given to schedule  Last pap smear/pelvic exam: 02/2011- declines another DEXA: 07/2012, normal  Heart cath June 1999, no blockages, coronary spasms CTA 12/2019 IMPRESSION: 1. Coronary calcium score of 693. This was 78th percentile for age and sex matched control. 2. Normal coronary origin with right dominance. 3. Calcified plaque causing moderate stenosis in the prox LAD, distal RCA, proximal LCx. 4.CAD-RADS 3. Moderate stenosis. Consider  symptom-guided anti-ischemic pharmacotherapy as well as risk factor modification per guideline directed care. Additional analysis with CT FFR will be submitted and reported separately.  Prior vaccinations: TD or Tdap: ALLERGY- just swollen arm  Influenza: 08/2020 Pneumococcal: 2016 Prevnar13: 2015  Shingrix 45 dollars a shot Covid 19: [facility]   Derm follows [facility] center, hx of melanoma to R sh[facility], goes annually, last 2021 Dr. Nicki Reaper for eye care, checking for glaucoma, 2021   Patient Care Team: Unk Pinto, MD as PCP - General (Internal Medicine) Buford Dresser, MD as PCP - Cardiology (Cardiology) Macarthur Critchley, [place] as Referring Physician (Optometry) Delfina Redwood as Referring Physician (Physician Assistant)  Allergies Allergies  Allergen Reactions  . Ciprofloxacin   .  Macrobid [Nitrofurantoin Macrocrystal]   . Penicillins     REACTION: rash  . Procaine Hcl     REACTION: difficulty breathing  . Sulfonamide Derivatives     REACTION: hives  . Tetanus Toxoid     REACTION: arm swells  . Tuberculin Ppd Swelling    SURGICAL HISTORY She  has a past surgical history that includes Abdominal hysterectomy; Tonsillectomy and adenoidectomy; Cataract extraction; Nasal sinus surgery; Rotator cuff repair (Left, more than 7 years ago); and Cyst removal hand (Left, 07/30/2018). FAMILY HISTORY Her family history includes Cancer in her maternal grandmother and mother; Coronary artery disease (age of onset: 80) in her father; Dementia in her maternal aunt and mother. SOCIAL HISTORY She  reports that she quit smoking about 7 years ago. Her smoking use included cigarettes. She has a 23.00 pack-year smoking history. She has never used smokeless tobacco. She reports previous alcohol use. She reports previous drug use.   Review of Systems:  Review of Systems  Constitutional: Negative for malaise/fatigue and weight loss.  HENT: Negative for  hearing loss and tinnitus.   Eyes: Negative for blurred vision and double vision.  Respiratory: Negative for cough, sputum production, shortness of breath and wheezing.   Cardiovascular: Negative for chest pain, palpitations, orthopnea, claudication, leg swelling and PND.  Gastrointestinal: Negative for abdominal pain, blood in stool, constipation, diarrhea, heartburn, melena, nausea and vomiting.  Genitourinary: Negative.   Musculoskeletal: Negative for falls, joint pain and myalgias.  Skin: Negative for rash.  Neurological: Positive for tingling (bil feet). Negative for dizziness, sensory change, weakness and headaches.  Endo/Heme/Allergies: Negative for polydipsia.  Psychiatric/Behavioral: Negative.  Negative for depression, memory loss, substance abuse and suicidal ideas. The patient is not nervous/anxious and does not have insomnia.   All other systems reviewed and are negative.   Physical Exam: BP 126/72   Pulse (!) 49   Temp (!) 97.3 F (36.3 C)   Wt 220 lb (99.8 kg)   SpO2 97%   BMI 34.46 kg/m  Wt Readings from Last 3 Encounters:  10/12/20 220 lb (99.8 kg)  09/10/20 222 lb (100.7 kg)  05/07/20 224 lb 12.8 oz (102 kg)   General Appearance: Well nourished, in no apparent distress. Eyes: PERRLA, EOMs, conjunctiva no swelling or erythema Sinuses: No Frontal/maxillary tenderness ENT/Mouth: Ext aud canals clear, TMs without erythema, bulging. No erythema, swelling, or exudate on post pharynx.  Tonsils not swollen or erythematous. Hearing normal.  Neck: Supple, thyroid normal.  Respiratory: Respiratory effort normal, BS equal bilaterally without rales, rhonchi, wheezing or stridor.  Cardio: RRR with no MRGs. Brisk peripheral pulses with mild edema bil lower legs  Abdomen: Soft, + BS,  Non tender, no guarding, rebound, hernias, masses. Lymphatics: Non tender without lymphadenopathy.  Musculoskeletal: Full ROM, 5/5 strength, normal steady gait Skin:  Warm, dry without rashes,  lesions, ecchymosis.  Neuro: Cranial nerves intact. Normal muscle tone, no cerebellar symptoms.Bil feet with diminished sensation throughout to monofilament Psych: Awake and oriented X 3, normal affect, Insight and Judgment appropriate.  Breasts: declines today GU: no concerns, defer  EKG: gets by cardiology, defer  Izora Ribas, NP 2:05 PM [facility]

## 2020-10-12 ENCOUNTER — Ambulatory Visit (INDEPENDENT_AMBULATORY_CARE_PROVIDER_SITE_OTHER): Payer: PPO | Admitting: Adult Health

## 2020-10-12 ENCOUNTER — Encounter: Payer: Self-pay | Admitting: Adult Health

## 2020-10-12 ENCOUNTER — Other Ambulatory Visit: Payer: Self-pay

## 2020-10-12 VITALS — BP 126/72 | HR 49 | Temp 97.3°F | Ht 67.0 in | Wt 220.0 lb

## 2020-10-12 DIAGNOSIS — Z85828 Personal history of other malignant neoplasm of skin: Secondary | ICD-10-CM

## 2020-10-12 DIAGNOSIS — D649 Anemia, unspecified: Secondary | ICD-10-CM | POA: Diagnosis not present

## 2020-10-12 DIAGNOSIS — E1122 Type 2 diabetes mellitus with diabetic chronic kidney disease: Secondary | ICD-10-CM

## 2020-10-12 DIAGNOSIS — E785 Hyperlipidemia, unspecified: Secondary | ICD-10-CM | POA: Diagnosis not present

## 2020-10-12 DIAGNOSIS — N183 Chronic kidney disease, stage 3 unspecified: Secondary | ICD-10-CM

## 2020-10-12 DIAGNOSIS — E118 Type 2 diabetes mellitus with unspecified complications: Secondary | ICD-10-CM | POA: Diagnosis not present

## 2020-10-12 DIAGNOSIS — K5730 Diverticulosis of large intestine without perforation or abscess without bleeding: Secondary | ICD-10-CM

## 2020-10-12 DIAGNOSIS — R0989 Other specified symptoms and signs involving the circulatory and respiratory systems: Secondary | ICD-10-CM | POA: Diagnosis not present

## 2020-10-12 DIAGNOSIS — I700 Atherosclerosis of aorta: Secondary | ICD-10-CM

## 2020-10-12 DIAGNOSIS — Z0000 Encounter for general adult medical examination without abnormal findings: Secondary | ICD-10-CM

## 2020-10-12 DIAGNOSIS — I2510 Atherosclerotic heart disease of native coronary artery without angina pectoris: Secondary | ICD-10-CM

## 2020-10-12 DIAGNOSIS — G9009 Other idiopathic peripheral autonomic neuropathy: Secondary | ICD-10-CM

## 2020-10-12 DIAGNOSIS — K2100 Gastro-esophageal reflux disease with esophagitis, without bleeding: Secondary | ICD-10-CM | POA: Diagnosis not present

## 2020-10-12 DIAGNOSIS — I872 Venous insufficiency (chronic) (peripheral): Secondary | ICD-10-CM

## 2020-10-12 DIAGNOSIS — E039 Hypothyroidism, unspecified: Secondary | ICD-10-CM

## 2020-10-12 DIAGNOSIS — Z87891 Personal history of nicotine dependence: Secondary | ICD-10-CM

## 2020-10-12 DIAGNOSIS — E1169 Type 2 diabetes mellitus with other specified complication: Secondary | ICD-10-CM

## 2020-10-12 DIAGNOSIS — E559 Vitamin D deficiency, unspecified: Secondary | ICD-10-CM

## 2020-10-12 DIAGNOSIS — Z79899 Other long term (current) drug therapy: Secondary | ICD-10-CM

## 2020-10-12 DIAGNOSIS — M1710 Unilateral primary osteoarthritis, unspecified knee: Secondary | ICD-10-CM

## 2020-10-12 DIAGNOSIS — Z1389 Encounter for screening for other disorder: Secondary | ICD-10-CM

## 2020-10-12 NOTE — Patient Instructions (Addendum)
  [name] , Thank you for taking time to come for your Annual Wellness Visit. I appreciate your ongoing commitment to your health goals. Please review the following plan we discussed and let me know if I can assist you in the future.   This is a list of the screening recommended for you and due dates:  Health Maintenance  Topic Date Due  . COVID-19 Vaccine (1) Never done  . Eye exam for diabetics  06/02/2017  . Complete foot exam   04/22/2020  . Colon Cancer Screening  08/16/2020  . Urine Protein Check  10/09/2020  . Tetanus Vaccine  09/22/2021*  . Mammogram  10/27/2020  . Hemoglobin A1C  11/06/2020  . Flu Shot  Completed  . DEXA scan (bone density measurement)  Completed  .  Hepatitis C: One time screening is recommended by Center for Disease Control  (CDC) for  adults born from [birth year] through 1963.   Completed  . Pneumonia vaccines  Completed  *Topic was postponed. The date shown is not the original due date.    Can schedule shingrix at [facility]  Recommend calling [facility] - [phone] - to discuss getting at [facility] associated clinic due to history of vaccine reactions  Please schedule colonoscopy after the new year  Please discuss R toe thickened area with podiatry   [facility] Schedule an appointment by calling [phone].     Know what a healthy weight is for you (roughly BMI <25) and aim to maintain this  Aim for 7+ servings of fruits and vegetables daily  65-80+ fluid ounces of water or unsweet tea for healthy kidneys  Limit to max 1 drink of alcohol per day; avoid smoking/tobacco  Limit animal fats in diet for cholesterol and heart health - choose grass fed whenever available  Avoid highly processed foods, and foods high in saturated/trans fats  Aim for low stress - take time to unwind and care for your mental health  Aim for 150 min of moderate intensity exercise weekly for heart health, and weights twice weekly for bone  health  Aim for 7-9 hours of sleep daily

## 2020-10-13 LAB — CBC WITH DIFFERENTIAL/PLATELET
Absolute Monocytes: 738 cells/uL (ref 200–950)
Basophils Absolute: 64 cells/uL (ref 0–200)
Basophils Relative: 0.6 %
Eosinophils Absolute: 139 cells/uL (ref 15–500)
Eosinophils Relative: 1.3 %
HCT: 41.9 % (ref 35.0–45.0)
Hemoglobin: 13.8 g/dL (ref 11.7–15.5)
Lymphs Abs: 3510 cells/uL (ref 850–3900)
MCH: 28.9 pg (ref 27.0–33.0)
MCHC: 32.9 g/dL (ref 32.0–36.0)
MCV: 87.7 fL (ref 80.0–100.0)
MPV: 10.9 fL (ref 7.5–12.5)
Monocytes Relative: 6.9 %
Neutro Abs: 6249 cells/uL (ref 1500–7800)
Neutrophils Relative %: 58.4 %
Platelets: 290 10*3/uL (ref 140–400)
RBC: 4.78 10*6/uL (ref 3.80–5.10)
RDW: 13.6 % (ref 11.0–15.0)
Total Lymphocyte: 32.8 %
WBC: 10.7 10*3/uL (ref 3.8–10.8)

## 2020-10-13 LAB — COMPLETE METABOLIC PANEL WITH GFR
AG Ratio: 1.9 (calc) (ref 1.0–2.5)
ALT: 21 U/L (ref 6–29)
AST: 19 U/L (ref 10–35)
Albumin: 4.1 g/dL (ref 3.6–5.1)
Alkaline phosphatase (APISO): 81 U/L (ref 37–153)
BUN/Creatinine Ratio: 16 (calc) (ref 6–22)
BUN: 16 mg/dL (ref 7–25)
CO2: 28 mmol/L (ref 20–32)
Calcium: 9.2 mg/dL (ref 8.6–10.4)
Chloride: 107 mmol/L (ref 98–110)
Creat: 1.02 mg/dL — ABNORMAL HIGH (ref 0.60–0.93)
GFR, Est African American: 64 mL/min/{1.73_m2} (ref >=60)
GFR, Est Non African American: 55 mL/min/{1.73_m2} — ABNORMAL LOW (ref >=60)
Globulin: 2.2 g/dL (calc) (ref 1.9–3.7)
Glucose, Bld: 88 mg/dL (ref 65–99)
Potassium: 4.3 mmol/L (ref 3.5–5.3)
Sodium: 144 mmol/L (ref 135–146)
Total Bilirubin: 0.9 mg/dL (ref 0.2–1.2)
Total Protein: 6.3 g/dL (ref 6.1–8.1)

## 2020-10-13 LAB — MICROALBUMIN / CREATININE URINE RATIO
Creatinine, Urine: 210 mg/dL (ref 20–275)
Microalb Creat Ratio: 7 mcg/mg creat (ref <30)
Microalb, Ur: 1.5 mg/dL

## 2020-10-13 LAB — URINALYSIS, ROUTINE W REFLEX MICROSCOPIC
Bacteria, UA: NONE SEEN /HPF
Bilirubin Urine: NEGATIVE
Glucose, UA: NEGATIVE
Hgb urine dipstick: NEGATIVE
Hyaline Cast: NONE SEEN /LPF
Nitrite: NEGATIVE
Protein, ur: NEGATIVE
Specific Gravity, Urine: 1.022 (ref 1.001–1.03)
pH: 5.5 (ref 5.0–8.0)

## 2020-10-13 LAB — HEMOGLOBIN A1C
Hgb A1c MFr Bld: 5.8 % of total Hgb — ABNORMAL HIGH (ref <5.7)
Mean Plasma Glucose: 120 (calc)
eAG (mmol/L): 6.6 (calc)

## 2020-10-13 LAB — TSH: TSH: 0.87 mIU/L (ref 0.40–4.50)

## 2020-10-13 LAB — LIPID PANEL
Cholesterol: 105 mg/dL (ref <200)
HDL: 42 mg/dL — ABNORMAL LOW (ref >=50)
LDL Cholesterol (Calc): 41 mg/dL (calc)
Non-HDL Cholesterol (Calc): 63 mg/dL (calc) (ref <130)
Total CHOL/HDL Ratio: 2.5 (calc) (ref <5.0)
Triglycerides: 136 mg/dL (ref <150)

## 2020-10-13 LAB — MAGNESIUM: Magnesium: 2.2 mg/dL (ref 1.5–2.5)

## 2020-10-13 LAB — VITAMIN B12: Vitamin B-12: >2000 pg/mL — ABNORMAL HIGH (ref 200–1100)

## 2020-10-29 ENCOUNTER — Encounter: Payer: Self-pay | Admitting: Internal Medicine

## 2020-10-30 ENCOUNTER — Ambulatory Visit: Payer: PPO | Admitting: Podiatry

## 2020-10-30 ENCOUNTER — Encounter: Payer: Self-pay | Admitting: Podiatry

## 2020-10-30 ENCOUNTER — Other Ambulatory Visit: Payer: Self-pay

## 2020-10-30 DIAGNOSIS — M79675 Pain in left toe(s): Secondary | ICD-10-CM

## 2020-10-30 DIAGNOSIS — B351 Tinea unguium: Secondary | ICD-10-CM

## 2020-10-30 DIAGNOSIS — M2041 Other hammer toe(s) (acquired), right foot: Secondary | ICD-10-CM

## 2020-10-30 DIAGNOSIS — E1142 Type 2 diabetes mellitus with diabetic polyneuropathy: Secondary | ICD-10-CM

## 2020-10-30 DIAGNOSIS — L84 Corns and callosities: Secondary | ICD-10-CM

## 2020-10-30 DIAGNOSIS — M2042 Other hammer toe(s) (acquired), left foot: Secondary | ICD-10-CM

## 2020-10-30 DIAGNOSIS — M79674 Pain in right toe(s): Secondary | ICD-10-CM | POA: Diagnosis not present

## 2020-11-02 ENCOUNTER — Ambulatory Visit: Payer: PPO | Admitting: Cardiology

## 2020-11-05 NOTE — Progress Notes (Signed)
Subjective: [name] presents today at risk foot care. Pt has h/o NIDDM with chronic kidney disease and painful corn(s) b/l and callus(es) b/l and painful mycotic nails b/l.  Pain interferes with ambulation. Aggravating factors include wearing enclosed shoe gear.  Unk Pinto, MD is patient's PCP. Last visit was: 05/07/2020.  She relates corns are really bothering her on today's visit.   Past Medical History:  Diagnosis Date  . Anemia   . Chest tightness   . CKD stage 3 secondary to diabetes ([place]) 10/09/2019  . Fatigue   . GERD (gastroesophageal reflux disease)   . Heart palpitations 12/06/2019  . Hyperlipidemia   . Hypothyroid   . Internal hemorrhoids 05/04/2009   Qualifier: Diagnosis of  By: Nelson-Smith CMA (AAMA), Dottie    . Myalgia   . Obesity   . Prediabetes   . Ulcer   . Varicose veins      Current Outpatient Medications on File Prior to Visit  Medication Sig Dispense Refill  . Acetaminophen (TYLENOL ARTHRITIS PAIN PO) Take by mouth.    Marland Kitchen aspirin 81 MG tablet Take 81 mg by mouth daily.    Marland Kitchen atorvastatin (LIPITOR) 40 MG tablet Take 1 tablet Daily  for Cholesterol 90 tablet 3  . azithromycin (ZITHROMAX) 250 MG tablet Take by mouth.    Marland Kitchen b complex vitamins tablet Take 1 tablet by mouth daily.    . diclofenac sodium (VOLTAREN) 1 % GEL Apply 4 g topically 4 (four) times daily. 100 g 3  . dicyclomine (BENTYL) 20 MG tablet Take 1 tablet (20 mg total) by mouth 3 (three) times daily as needed for spasms. 270 tablet 0  . dorzolamide-timolol (COSOPT) 22.3-6.8 MG/ML ophthalmic solution USE 1 DROP INTO THE AFFECTED EYE 2 TIMES DAILY  1  . ezetimibe (ZETIA) 10 MG tablet Take 1 tablet (10 mg total) by mouth daily. 90 tablet 3  . famotidine (PEPCID) 40 MG tablet Take 1 tablet at Bedtime for Indigestion & Heartburn 90 tablet 3  . furosemide (LASIX) 20 MG tablet Take 1 tablet 2 x /day for BP & Fluid Retention 180 tablet 3  . gabapentin (NEURONTIN) 800 MG tablet Take 1 tablet by  mouth 3 times a day 270 tablet 1  . levothyroxine (SYNTHROID) 75 MCG tablet Take 1 to 1.5 tablet daily as directed on an empty stomach with only water for 30 minutes & No Antacid meds, Calcium or Magnesium for 4 hrs 114 tablet 3  . meloxicam (MOBIC) 15 MG tablet Take one daily with food for 2 weeks, can take with tylenol, can not take with aleve, iburpofen, then as needed daily for pain 30 tablet 1  . Multiple Vitamin (MULTIVITAMIN) tablet Take 1 tablet by mouth daily.    . mupirocin ointment (BACTROBAN) 2 % Apply to wound after soaking BID (Patient not taking: Reported on 10/12/2020) 30 g 1  . nitroGLYCERIN (NITROSTAT) 0.4 MG SL tablet Place 1 tablet (0.4 mg total) under the tongue every 5 (five) minutes as needed for chest pain. 90 tablet 3  . pantoprazole (PROTONIX) 40 MG tablet Take 1 tablet (40 mg total) by mouth daily. 90 tablet 1  . promethazine-dextromethorphan (PROMETHAZINE-DM) 6.25-15 MG/5ML syrup Take 5 mLs by mouth every 4 (four) hours.    . verapamil (CALAN) 80 MG tablet TAKE AS NEEDED FOR PALPITATIONS UP TO 3 X A DAY (Patient not taking: Reported on 10/12/2020) 270 tablet 1   No current facility-administered medications on file prior to visit.  Allergies  Allergen Reactions  . Ciprofloxacin   . Macrobid [Nitrofurantoin Macrocrystal]   . Penicillins     REACTION: rash  . Procaine Hcl     REACTION: difficulty breathing  . Sulfonamide Derivatives     REACTION: hives  . Tetanus Toxoid     REACTION: arm swells  . Tuberculin Ppd Swelling    Objective: [name] is a pleasant 74 y.o. Caucasian female in NAD. AAO x 3.  There were no vitals filed for this visit.  Vascular Examination: Neurovascular status unchanged b/l lower extremities. Capillary fill time to digits <3 seconds b/l lower extremities. Palpable pedal pulses b/l LE. Pedal hair absent. Lower extremity skin temperature gradient within normal limits.  Dermatological Examination: Pedal skin with normal  turgor, texture and tone bilaterally. No open wounds bilaterally. No interdigital macerations bilaterally. Toenails 1-5 b/l elongated, discolored, dystrophic, thickened, crumbly with subungual debris and tenderness to dorsal palpation. Hyperkeratotic lesion(s) L hallux, L 2nd toe, R hallux, R 2nd toe, R 3rd toe and submet head 5 left foot.  No erythema, no edema, no drainage, no flocculence.   Right hallux callus with subdermal hemorrhage.  Musculoskeletal: Normal muscle strength 5/5 to all lower extremity muscle groups bilaterally. No pain crepitus or joint limitation noted with ROM b/l. Hammertoes noted to the 1-5 bilaterally.  Neurological Examination: Protective sensation diminished with 10g monofilament b/l.  Last A1c: Hemoglobin A1C Latest Ref Rng & Units 10/12/2020 05/07/2020 01/23/2020  HGBA1C <5.7 % of total Hgb 5.8(H) 5.9(H) 5.9(H)  Some recent data might be hidden    Assessment: 1. Pain due to onychomycosis of toenails of both feet   2. Acquired hammertoes of both feet   3. Corns   4. Pre-ulcerative calluses   5. Diabetic peripheral neuropathy associated with type 2 diabetes mellitus ([place])      Plan: -Examined patient. -Continue diabetic foot care principles. -Toenails 1-5 b/l were debrided in length and girth with sterile nail nippers and dremel without iatrogenic bleeding.  -Consultation with Dr. Sherryle Lis for rigid hammertoe deformity right hallux with chronic preulcerative callus and left 2nd toe. -Offending nail border debrided and curretaged L hallux and R hallux utilizing sterile nail nipper and currette. Border(s) cleansed with alcohol and triple antibiotic ointment applied.  -Corn(s) L 2nd toe, R 2nd toe and R 3rd toe and callus(es) L hallux, R hallux and submet head 5 left foot were pared utilizing sterile scalpel blade without incident. Total number debrided =6. -Aperture pad applied for right hallux. -Patient to report any pedal injuries to medical professional  immediately. -Patient to continue soft, supportive shoe gear daily. -Patient/POA to call should there be question/concern in the interim.  Return in about 9 weeks (around 01/01/2021).  Marzetta Board, DPM

## 2020-11-09 ENCOUNTER — Other Ambulatory Visit: Payer: Self-pay | Admitting: Internal Medicine

## 2020-11-09 DIAGNOSIS — K219 Gastro-esophageal reflux disease without esophagitis: Secondary | ICD-10-CM

## 2020-11-09 MED ORDER — PANTOPRAZOLE SODIUM 40 MG PO TBEC: DELAYED_RELEASE_TABLET | ORAL | 0 refills | Status: DC

## 2020-12-02 DIAGNOSIS — Z1231 Encounter for screening mammogram for malignant neoplasm of breast: Secondary | ICD-10-CM | POA: Diagnosis not present

## 2020-12-02 LAB — HM MAMMOGRAPHY

## 2021-01-01 ENCOUNTER — Encounter: Payer: Self-pay | Admitting: Podiatry

## 2021-01-01 ENCOUNTER — Other Ambulatory Visit: Payer: Self-pay

## 2021-01-01 ENCOUNTER — Ambulatory Visit: Payer: PPO | Admitting: Podiatry

## 2021-01-01 ENCOUNTER — Encounter: Payer: Self-pay | Admitting: Gastroenterology

## 2021-01-01 DIAGNOSIS — M79675 Pain in left toe(s): Secondary | ICD-10-CM

## 2021-01-01 DIAGNOSIS — E1142 Type 2 diabetes mellitus with diabetic polyneuropathy: Secondary | ICD-10-CM | POA: Diagnosis not present

## 2021-01-01 DIAGNOSIS — M79674 Pain in right toe(s): Secondary | ICD-10-CM

## 2021-01-01 DIAGNOSIS — B351 Tinea unguium: Secondary | ICD-10-CM | POA: Diagnosis not present

## 2021-01-01 DIAGNOSIS — L84 Corns and callosities: Secondary | ICD-10-CM | POA: Diagnosis not present

## 2021-01-01 DIAGNOSIS — M2041 Other hammer toe(s) (acquired), right foot: Secondary | ICD-10-CM

## 2021-01-01 DIAGNOSIS — M2042 Other hammer toe(s) (acquired), left foot: Secondary | ICD-10-CM

## 2021-01-01 NOTE — Progress Notes (Signed)
Subjective: [name] presents today at risk foot care. Pt has h/o NIDDM with chronic kidney disease and painful corn(s) b/l and callus(es) b/l and painful mycotic nails b/l.  Pain interferes with ambulation. Aggravating factors include wearing enclosed shoe gear.  She states callus on right hallux is improved with offloading pad placed in her shoe.  Unk Pinto, MD is patient's PCP. Last visit was: 05/07/2020.  Allergies  Allergen Reactions  . Ciprofloxacin   . Macrobid [Nitrofurantoin Macrocrystal]   . Penicillins     REACTION: rash  . Procaine Hcl     REACTION: difficulty breathing  . Sulfonamide Derivatives     REACTION: hives  . Tetanus Toxoid     REACTION: arm swells  . Tuberculin Ppd Swelling    Objective: [name] is a pleasant 75 y.o. Caucasian female in NAD. AAO x 3.  There were no vitals filed for this visit.  Vascular Examination: Neurovascular status unchanged b/l lower extremities. Capillary fill time to digits <3 seconds b/l lower extremities. Palpable pedal pulses b/l LE. Pedal hair absent. Lower extremity skin temperature gradient within normal limits.  Dermatological Examination: Pedal skin with normal turgor, texture and tone bilaterally. No open wounds bilaterally. No interdigital macerations bilaterally. Toenails 1-5 b/l elongated, discolored, dystrophic, thickened, crumbly with subungual debris and tenderness to dorsal palpation. Hyperkeratotic lesion(s) L hallux, L 2nd toe, R hallux, R 2nd toe, R 3rd toe and submet head 5 left foot.  No erythema, no edema, no drainage, no flocculence.   Right hallux callus with subdermal hemorrhage.  Musculoskeletal: Normal muscle strength 5/5 to all lower extremity muscle groups bilaterally. No pain crepitus or joint limitation noted with ROM b/l. Hammertoes noted to the 1-5 bilaterally.  Neurological Examination: Protective sensation diminished with 10g monofilament b/l.  Last A1c: Hemoglobin A1C  Latest Ref Rng & Units 10/12/2020 05/07/2020 01/23/2020  HGBA1C <5.7 % of total Hgb 5.8(H) 5.9(H) 5.9(H)  Some recent data might be hidden    Assessment: 1. Pain due to onychomycosis of toenails of both feet   2. Corns and callosities   3. Acquired hammertoes of both feet   4. Diabetic peripheral neuropathy associated with type 2 diabetes mellitus ([place])     Plan: -Examined patient. -Continue diabetic foot care principles. -Toenails 1-5 b/l were debrided in length and girth with sterile nail nippers and dremel without iatrogenic bleeding.  -Offending nail border debrided and curretaged L hallux and R hallux utilizing sterile nail nipper and currette. Border(s) cleansed with alcohol and triple antibiotic ointment applied.  -Corn(s) L 2nd toe, R 2nd toe and R 3rd toe and callus(es) L hallux, R hallux and submet head 5 left foot were pared utilizing sterile scalpel blade without incident. Total number debrided =6. -Replaced aperture pad in shoe for right hallux. -Patient to report any pedal injuries to medical professional immediately. -Patient to continue soft, supportive shoe gear daily. -Patient/POA to call should there be question/concern in the interim.  Return in about 9 weeks (around 03/05/2021) for diabetic nail and callus trim.  Marzetta Board, DPM

## 2021-01-05 ENCOUNTER — Encounter: Payer: Self-pay | Admitting: Internal Medicine

## 2021-01-11 ENCOUNTER — Other Ambulatory Visit: Payer: Self-pay | Admitting: Internal Medicine

## 2021-01-11 ENCOUNTER — Other Ambulatory Visit: Payer: Self-pay

## 2021-01-11 DIAGNOSIS — H401131 Primary open-angle glaucoma, bilateral, mild stage: Secondary | ICD-10-CM | POA: Diagnosis not present

## 2021-01-11 DIAGNOSIS — E782 Mixed hyperlipidemia: Secondary | ICD-10-CM

## 2021-01-11 DIAGNOSIS — K219 Gastro-esophageal reflux disease without esophagitis: Secondary | ICD-10-CM

## 2021-01-11 MED ORDER — PANTOPRAZOLE SODIUM 40 MG PO TBEC: DELAYED_RELEASE_TABLET | ORAL | 0 refills | Status: DC

## 2021-01-11 MED ORDER — EZETIMIBE 10 MG PO TABS: 10.0000 mg | ORAL_TABLET | Freq: Every day | ORAL | 3 refills | Status: DC

## 2021-01-11 MED ORDER — GABAPENTIN 800 MG PO TABS: ORAL_TABLET | ORAL | 0 refills | Status: DC

## 2021-01-27 ENCOUNTER — Other Ambulatory Visit: Payer: Self-pay

## 2021-01-27 ENCOUNTER — Encounter: Payer: Self-pay | Admitting: Adult Health Nurse Practitioner

## 2021-01-27 ENCOUNTER — Ambulatory Visit (INDEPENDENT_AMBULATORY_CARE_PROVIDER_SITE_OTHER): Payer: PPO | Admitting: Adult Health Nurse Practitioner

## 2021-01-27 VITALS — BP 132/74 | HR 61 | Temp 97.7°F | Ht 67.0 in | Wt 223.0 lb

## 2021-01-27 DIAGNOSIS — I700 Atherosclerosis of aorta: Secondary | ICD-10-CM | POA: Diagnosis not present

## 2021-01-27 DIAGNOSIS — R6889 Other general symptoms and signs: Secondary | ICD-10-CM | POA: Diagnosis not present

## 2021-01-27 DIAGNOSIS — R0989 Other specified symptoms and signs involving the circulatory and respiratory systems: Secondary | ICD-10-CM | POA: Diagnosis not present

## 2021-01-27 DIAGNOSIS — Z0001 Encounter for general adult medical examination with abnormal findings: Secondary | ICD-10-CM | POA: Diagnosis not present

## 2021-01-27 DIAGNOSIS — E039 Hypothyroidism, unspecified: Secondary | ICD-10-CM | POA: Diagnosis not present

## 2021-01-27 DIAGNOSIS — E1122 Type 2 diabetes mellitus with diabetic chronic kidney disease: Secondary | ICD-10-CM | POA: Diagnosis not present

## 2021-01-27 DIAGNOSIS — Z79899 Other long term (current) drug therapy: Secondary | ICD-10-CM | POA: Diagnosis not present

## 2021-01-27 DIAGNOSIS — Z85828 Personal history of other malignant neoplasm of skin: Secondary | ICD-10-CM

## 2021-01-27 DIAGNOSIS — I2510 Atherosclerotic heart disease of native coronary artery without angina pectoris: Secondary | ICD-10-CM | POA: Diagnosis not present

## 2021-01-27 DIAGNOSIS — E1169 Type 2 diabetes mellitus with other specified complication: Secondary | ICD-10-CM

## 2021-01-27 DIAGNOSIS — K219 Gastro-esophageal reflux disease without esophagitis: Secondary | ICD-10-CM

## 2021-01-27 DIAGNOSIS — E559 Vitamin D deficiency, unspecified: Secondary | ICD-10-CM

## 2021-01-27 DIAGNOSIS — Z0000 Encounter for general adult medical examination without abnormal findings: Secondary | ICD-10-CM

## 2021-01-27 DIAGNOSIS — N183 Chronic kidney disease, stage 3 unspecified: Secondary | ICD-10-CM

## 2021-01-27 DIAGNOSIS — E118 Type 2 diabetes mellitus with unspecified complications: Secondary | ICD-10-CM

## 2021-01-27 DIAGNOSIS — E785 Hyperlipidemia, unspecified: Secondary | ICD-10-CM | POA: Diagnosis not present

## 2021-01-27 DIAGNOSIS — M1710 Unilateral primary osteoarthritis, unspecified knee: Secondary | ICD-10-CM

## 2021-01-27 DIAGNOSIS — K5730 Diverticulosis of large intestine without perforation or abscess without bleeding: Secondary | ICD-10-CM

## 2021-01-27 DIAGNOSIS — G9009 Other idiopathic peripheral autonomic neuropathy: Secondary | ICD-10-CM

## 2021-01-27 DIAGNOSIS — I872 Venous insufficiency (chronic) (peripheral): Secondary | ICD-10-CM

## 2021-01-27 MED ORDER — DICLOFENAC SODIUM 1 % EX GEL
4.0000 g | Freq: Four times a day (QID) | CUTANEOUS | 2 refills | Status: DC
Start: 2021-01-27 — End: 2024-02-15

## 2021-01-27 NOTE — Progress Notes (Addendum)
MEDICARE ANNUAL WELLNESS    Assessment and Plan:   Encounter for Medicare annual wellness Yearly  Controlled type 2 diabetes mellitus with complication, without long-term current use of insulin ([place]) Discussed general issues about diabetes pathophysiology and management., Educational material distributed., Suggested low cholesterol diet., Encouraged aerobic exercise., Discussed foot care., Reminded to get yearly retinal exam. -     Hemoglobin A1c ([facility])  Hyperlipidemia associated with T2DM (HCC) check lipids, statin/zetia for LDL goal <70 decrease fatty foods increase activity.  -    Lipid panel   CKD stage 3 secondary to diabetes (HCC) Increase fluids, avoid NSAIDS, monitor sugars, will monitor -    CMP/GFR -    UA/microalbumin  Atherosclerosis of aorta (HCC) Per CT 03/23/17 Control blood pressure, cholesterol, glucose, increase exercise.   CAD Cardiology following; defer EKG to them Control blood pressure, cholesterol, glucose, increase exercise.  Go to the ER if any chest pain, shortness of breath, nausea, dizziness.  Morbid obesity ([place]) - follow up 3 months for progress monitoring - increase veggies, decrease carbs - long discussion about weight loss, diet, and exercise  Labile hypertension - continue medications, DASH diet, exercise and monitor at home. Call if greater than 130/80.  -     CBC with Diff -     COMPLETE METABOLIC PANEL WITH GFR -     Urinalysis, Routine w reflex microscopic -     Microalbumin / Creatinine Urine Ratio  Hypothyroidism, unspecified type -     TSH Hypothyroidism-check TSH level, continue medications the same, reminded to take on an empty stomach 30-8mins before food.   Medication management Continued  Vitamin D deficiency -     Vitamin D (25 hydroxy)  History of tobacco use -23 pack year Monitor, had benign CT 12/2019  Chronic venous insufficiency - weight loss discussed, continue compression stockings and  elevation  History of basal cell carcinoma (BCC) Follow up derm  Gastroesophageal reflux disease with esophagitis without hemorrhage Continue PPI/H2 blocker, diet discussed  Diverticulosis of large intestine without hemorrhage Bowel management, soluble fiber, monitor   Peripheral autonomic neuropathy of unknown cause Monitor  Primary osteoarthritis of knee, unspecified laterality Ortho following, monitoring at this time     Continue diet and meds as discussed. Further disposition pending results of labs. Over 30 minutes of face to face interview, exam, counseling, chart review, and critical decision making was performed  Future Appointments  Date Time Provider [place]  03/09/2021  1:30 PM Gardiner Barefoot, DPM [facility]  05/06/2021  2:30 PM Unk Pinto, MD [facility] None  10/12/2021  2:00 PM Liane Comber, NP [facility] None  01/27/2022  2:30 PM Garnet Sierras, NP [facility] None    HPI 75 y.o. female  presents for Medicare annual wellness and 3 month follow up. She has Diverticulosis of large intestine; History of tobacco use; Hypothyroidism; Controlled type 2 diabetes mellitus with complication, without long-term current use of insulin ([place]); Morbid obesity ([place]); Chronic venous insufficiency; Peripheral autonomic neuropathy of unknown cause; Labile hypertension; History of basal cell carcinoma (BCC); Osteoarthritis of knee; Atherosclerosis of aorta ([place]); Vitamin D deficiency; Medication management; Gastroesophageal reflux disease with esophagitis; Type 2 diabetes mellitus with hyperlipidemia ([place]); CKD stage 3 secondary to diabetes ([place]); Family history of heart disease; and CAD (coronary artery disease) on their problem list.  She is widowed, 3 grandchildren, 1 great grandson - 65 months.  She is retired from [employer] -   She had covid 70 in August 2021, has noted some hair loss  and dry skin but has improved. GERD controlled on protonix and  famotidine.   She is former smoker - 23.5 pack year hisotry, quit 2015, strong family hx of llung cancer, she had CT chest for coronary in 12/26/2019 which showed benign appearing nodules.   BMI is Body mass index is 34.93 kg/m., she is working on diet and exercise, trying to watch what she eats and smaller portions. Down from 246 lb in 07/2018.  Wt Readings from Last 3 Encounters:  01/27/21 223 lb (101.2 kg)  10/12/20 220 lb (99.8 kg)  09/10/20 222 lb (100.7 kg)   She is following with Dr. Andree Elk for CAD; in 01/2020 she had cardiac CT 12/27/19  showing CCS of 693, 78th % for age/gender, Calcified plaque causing moderate stenosis in the prox LAD, distal RCA, proximal LCx, moderate stenosis, and also aortic atherosclerosis   Her blood pressure has been controlled at home, has been good, today their BP is BP: 132/74  She does workout. She denies chest pain, shortness of breath, dizziness.  She has been working on diet and exercise for Diabetes, doing lifestyle changes  with diabetic chronic kidney disease she is on ACE/ARB With hyperlipidemia IS at goal less than 70, she taking atorvastatin 40 mg and ezetimibe 10 mg  she is on bASA Has idiopathic paraesthsias and is on gabapentin- follows with Dr. Adah Perl every 90 days, did have infection/ulcer of left second toe in the past, no recent issues, checks feet daily  Eye exam Dr. Nicki Reaper - saw recently, report requested  denies polydipsia, polyuria and visual disturbances.   Last A1C was:  Lab Results  Component Value Date   HGBA1C 5.7 (H) 01/27/2021   Lab Results  Component Value Date   GFRNONAA 46 (L) 01/27/2021   Lab Results  Component Value Date   CHOL 125 01/27/2021   HDL 44 (L) 01/27/2021   LDLCALC 55 01/27/2021   TRIG 179 (H) 01/27/2021   CHOLHDL 2.8 01/27/2021   Patient is on Vitamin D supplement.   Lab Results  Component Value Date   VD25OH 56 05/07/2020     She is on thyroid medication. Her medication was not  changed last visit. 75 mcg 1.5 tabs 4 days a week, 1 tab 3 days a week, takes on empty stomach with water only.   Lab Results  Component Value Date   TSH 0.31 (L) 01/27/2021  .     Current Medications:   Current Outpatient Medications (Endocrine & Metabolic):  .  levothyroxine (SYNTHROID) 75 MCG tablet, Take 1 to 1.5 tablet daily as directed on an empty stomach with only water for 30 minutes & No Antacid meds, Calcium or Magnesium for 4 hrs  Current Outpatient Medications (Cardiovascular):  .  atorvastatin (LIPITOR) 40 MG tablet, Take 1 tablet Daily  for Cholesterol .  ezetimibe (ZETIA) 10 MG tablet, Take 1 tablet (10 mg total) by mouth daily. .  furosemide (LASIX) 20 MG tablet, Take 1 tablet 2 x /day for BP & Fluid Retention .  verapamil (CALAN) 80 MG tablet, TAKE AS NEEDED FOR PALPITATIONS UP TO 3 X A DAY .  nitroGLYCERIN (NITROSTAT) 0.4 MG SL tablet, Place 1 tablet (0.4 mg total) under the tongue every 5 (five) minutes as needed for chest pain.  Current Outpatient Medications (Respiratory):  .  promethazine-dextromethorphan (PROMETHAZINE-DM) 6.25-15 MG/5ML syrup, Take 5 mLs by mouth every 4 (four) hours. (Patient not taking: Reported on 01/27/2021)  Current Outpatient Medications (Analgesics):  Marland Kitchen  Acetaminophen (TYLENOL  ARTHRITIS PAIN PO), Take by mouth. Marland Kitchen  aspirin 81 MG tablet, Take 81 mg by mouth daily. .  meloxicam (MOBIC) 15 MG tablet, Take one daily with food for 2 weeks, can take with tylenol, can not take with aleve, iburpofen, then as needed daily for pain   Current Outpatient Medications (Other):  .  b complex vitamins tablet, Take 1 tablet by mouth daily. .  diclofenac Sodium (VOLTAREN) 1 % GEL, Apply 4 g topically 4 (four) times daily. .  dorzolamide-timolol (COSOPT) 22.3-6.8 MG/ML ophthalmic solution, USE 1 DROP INTO THE AFFECTED EYE 2 TIMES DAILY .  gabapentin (NEURONTIN) 800 MG tablet, Take  1 capsule  3 x /day  for Chronic Pain .  Multiple Vitamin (MULTIVITAMIN)  tablet, Take 1 tablet by mouth daily. .  mupirocin ointment (BACTROBAN) 2 %, Apply to wound after soaking BID .  pantoprazole (PROTONIX) 40 MG tablet, Take  1 tablet  Daily  to Prevent Heartburn & Indigestion .  dicyclomine (BENTYL) 20 MG tablet, Take 1 tablet (20 mg total) by mouth 3 (three) times daily as needed for spasms. .  famotidine (PEPCID) 40 MG tablet, Take 1 tablet by mouth at bedtime for indigestion and heartburn  Medical History:  Past Medical History:  Diagnosis Date  . Anemia   . Chest tightness   . CKD stage 3 secondary to diabetes ([place]) 10/09/2019  . Fatigue   . GERD (gastroesophageal reflux disease)   . Heart palpitations 12/06/2019  . Hyperlipidemia   . Hypothyroid   . Internal hemorrhoids 05/04/2009   Qualifier: Diagnosis of  By: Nelson-Smith CMA (AAMA), Dottie    . Myalgia   . Obesity   . Prediabetes   . Ulcer   . Varicose veins    Health Maintenance:   Immunization History  Administered Date(s) Administered  . Influenza, High Dose Seasonal PF 08/19/2014, 09/10/2015, 07/26/2016, 08/10/2017, 08/14/2018, 08/27/2019, 09/10/2020  . Pneumococcal Conjugate-13 05/12/2014  . Pneumococcal Polysaccharide-23 09/05/2007, 06/02/2015   Preventative care: Last colonoscopy: 2011, Past Due 2021, Dr. Kalman Shan, [facility], scheduled but rescheduled, doesn't want before the holidays, has number to call   Last mammogram: 11/2020 Last pap smear/pelvic exam: 02/2011- declines another DEXA: 07/2012, normal  Heart cath June 1999, no blockages, coronary spasms  Prior vaccinations: TD or Tdap: ALLERGY- just swollen arm  Influenza: 08/2020 Pneumococcal: 2016 Prevnar13: 2015  Shingrix 45 dollars a shot, discussed. Covid 19: plans to get through [facility]  Personal history of 06/2020  Derm follows [facility] surgery center, hx of melanoma to [facility], goes annually, last 2021 Dr. Nicki Reaper for eye care, checking for glaucoma, 2021   Patient Care Team: Unk Pinto, MD as PCP - General  (Internal Medicine) Buford Dresser, MD as PCP - Cardiology (Cardiology) Macarthur Critchley, [place] as Referring Physician (Optometry) Delfina Redwood as Referring Physician (Physician Assistant)  Allergies Allergies  Allergen Reactions  . Ciprofloxacin   . Macrobid [Nitrofurantoin Macrocrystal]   . Penicillins     REACTION: rash  . Procaine Hcl     REACTION: difficulty breathing  . Sulfonamide Derivatives     REACTION: hives  . Tetanus Toxoid     REACTION: arm swells  . Tuberculin Ppd Swelling    SURGICAL HISTORY She  has a past surgical history that includes Abdominal hysterectomy; Tonsillectomy and adenoidectomy; Cataract extraction (Bilateral); Nasal sinus surgery; Rotator cuff repair (Left, more than 7 years ago); and Cyst removal hand (Left, 07/30/2018). FAMILY HISTORY Her family history includes Coronary artery disease (age of onset:  30) in her father; Dementia in her maternal aunt and mother; Lung cancer in her maternal grandmother and mother. SOCIAL HISTORY She  reports that she quit smoking about 7 years ago. Her smoking use included cigarettes. She started smoking about 55 years ago. She has a 23.50 pack-year smoking history. She has never used smokeless tobacco. She reports previous alcohol use. She reports that she does not use drugs.  MEDICARE WELLNESS OBJECTIVES: Physical activity: Current Exercise Habits: Home exercise routine, Type of exercise: Other - see comments, Time (Minutes): 30, Frequency (Times/Week): 7, Weekly Exercise (Minutes/Week): 210, Intensity: Mild Cardiac risk factors: Cardiac Risk Factors include: advanced age (>42men, >3 women);diabetes mellitus;dyslipidemia;hypertension;obesity (BMI >30kg/m2) Depression/mood screen:   Depression screen [facility] 2/9 01/27/2021  Decreased Interest -  Down, Depressed, Hopeless 0  PHQ - 2 Score 0    ADLs:  In your present state of health, do you have any difficulty performing the following activities: 01/27/2021   Vision? N  Difficulty concentrating or making decisions? N  Walking or climbing stairs? N  Dressing or bathing? N  Doing errands, shopping? N  Preparing Food and eating ? N  Using the Toilet? N  In the past six months, have you accidently leaked urine? N  Do you have problems with loss of bowel control? N  Managing your Medications? N  Managing your Finances? N  Housekeeping or managing your Housekeeping? N  Some recent data might be hidden     Cognitive Testing  Alert? Yes  Normal Appearance?Yes  Oriented to person? Yes  Place? Yes   Time? Yes  Recall of three objects?  Yes  Can perform simple calculations? Yes  Displays appropriate judgment?Yes  Can read the correct time from a watch face?Yes  EOL planning: Does Patient Have a Medical Advance Directive?: Yes Type of Advance Directive: [place] will Does patient want to make changes to medical advance directive?: No - Patient declined Copy of [place] in Chart?: No - copy requested    Review of Systems:  Review of Systems  Constitutional: Negative for malaise/fatigue and weight loss.  HENT: Negative for hearing loss and tinnitus.   Eyes: Negative for blurred vision and double vision.  Respiratory: Negative for cough, sputum production, shortness of breath and wheezing.   Cardiovascular: Negative for chest pain, palpitations, orthopnea, claudication, leg swelling and PND.  Gastrointestinal: Negative for abdominal pain, blood in stool, constipation, diarrhea, heartburn, melena, nausea and vomiting.  Genitourinary: Negative.   Musculoskeletal: Negative for falls, joint pain and myalgias.  Skin: Negative for rash.  Neurological: Positive for tingling (bil feet). Negative for dizziness, sensory change, weakness and headaches.  Endo/Heme/Allergies: Negative for polydipsia.  Psychiatric/Behavioral: Negative.  Negative for depression, memory loss, substance abuse and suicidal  ideas. The patient is not nervous/anxious and does not have insomnia.   All other systems reviewed and are negative.   Physical Exam: BP 132/74   Pulse 61   Temp 97.7 F (36.5 C)   Ht 5\' 7"  (1.702 m)   Wt 223 lb (101.2 kg)   SpO2 97%   BMI 34.93 kg/m  Wt Readings from Last 3 Encounters:  01/27/21 223 lb (101.2 kg)  10/12/20 220 lb (99.8 kg)  09/10/20 222 lb (100.7 kg)   General Appearance: Well nourished, in no apparent distress. Eyes: PERRLA, EOMs, conjunctiva no swelling or erythema Sinuses: No Frontal/maxillary tenderness ENT/Mouth: Ext aud canals clear, TMs without erythema, bulging. No erythema, swelling, or exudate on post pharynx.  Tonsils  not swollen or erythematous. Hearing normal.  Neck: Supple, thyroid normal.  Respiratory: Respiratory effort normal, BS equal bilaterally without rales, rhonchi, wheezing or stridor.  Cardio: RRR with no MRGs. Brisk peripheral pulses with mild edema bil lower legs  Abdomen: Soft, + BS,  Non tender, no guarding, rebound, hernias, masses. Lymphatics: Non tender without lymphadenopathy.  Musculoskeletal: Full ROM, 5/5 strength, normal steady gait Skin:  Warm, dry without rashes, lesions, ecchymosis.  Neuro: Cranial nerves intact. Normal muscle tone, no cerebellar symptoms.Bil feet with diminished sensation throughout to monofilament Psych: Awake and oriented X 3, normal affect, Insight and Judgment appropriate.  Breasts: declines today GU: no concerns, defer  EKG: gets by cardiology, defer   Garnet Sierras, NP 3:30PM [facility]

## 2021-01-28 LAB — HEMOGLOBIN A1C
Hgb A1c MFr Bld: 5.7 % of total Hgb — ABNORMAL HIGH (ref <5.7)
Mean Plasma Glucose: 117 mg/dL
eAG (mmol/L): 6.5 mmol/L

## 2021-01-28 LAB — COMPLETE METABOLIC PANEL WITH GFR
AG Ratio: 1.9 (calc) (ref 1.0–2.5)
ALT: 17 U/L (ref 6–29)
AST: 16 U/L (ref 10–35)
Albumin: 4.1 g/dL (ref 3.6–5.1)
Alkaline phosphatase (APISO): 91 U/L (ref 37–153)
BUN/Creatinine Ratio: 16 (calc) (ref 6–22)
BUN: 19 mg/dL (ref 7–25)
CO2: 26 mmol/L (ref 20–32)
Calcium: 9.3 mg/dL (ref 8.6–10.4)
Chloride: 107 mmol/L (ref 98–110)
Creat: 1.17 mg/dL — ABNORMAL HIGH (ref 0.60–0.93)
GFR, Est African American: 54 mL/min/{1.73_m2} — ABNORMAL LOW (ref >=60)
GFR, Est Non African American: 46 mL/min/{1.73_m2} — ABNORMAL LOW (ref >=60)
Globulin: 2.2 g/dL (calc) (ref 1.9–3.7)
Glucose, Bld: 70 mg/dL (ref 65–99)
Potassium: 4.8 mmol/L (ref 3.5–5.3)
Sodium: 144 mmol/L (ref 135–146)
Total Bilirubin: 0.7 mg/dL (ref 0.2–1.2)
Total Protein: 6.3 g/dL (ref 6.1–8.1)

## 2021-01-28 LAB — CBC WITH DIFFERENTIAL/PLATELET
Absolute Monocytes: 880 cells/uL (ref 200–950)
Basophils Absolute: 40 cells/uL (ref 0–200)
Basophils Relative: 0.4 %
Eosinophils Absolute: 120 cells/uL (ref 15–500)
Eosinophils Relative: 1.2 %
HCT: 41.8 % (ref 35.0–45.0)
Hemoglobin: 13.9 g/dL (ref 11.7–15.5)
Lymphs Abs: 2900 cells/uL (ref 850–3900)
MCH: 29.4 pg (ref 27.0–33.0)
MCHC: 33.3 g/dL (ref 32.0–36.0)
MCV: 88.6 fL (ref 80.0–100.0)
MPV: 11.1 fL (ref 7.5–12.5)
Monocytes Relative: 8.8 %
Neutro Abs: 6060 cells/uL (ref 1500–7800)
Neutrophils Relative %: 60.6 %
Platelets: 271 10*3/uL (ref 140–400)
RBC: 4.72 10*6/uL (ref 3.80–5.10)
RDW: 13 % (ref 11.0–15.0)
Total Lymphocyte: 29 %
WBC: 10 10*3/uL (ref 3.8–10.8)

## 2021-01-28 LAB — LIPID PANEL
Cholesterol: 125 mg/dL (ref <200)
HDL: 44 mg/dL — ABNORMAL LOW (ref >=50)
LDL Cholesterol (Calc): 55 mg/dL (calc)
Non-HDL Cholesterol (Calc): 81 mg/dL (calc) (ref <130)
Total CHOL/HDL Ratio: 2.8 (calc) (ref <5.0)
Triglycerides: 179 mg/dL — ABNORMAL HIGH (ref <150)

## 2021-01-28 LAB — TSH: TSH: 0.31 mIU/L — ABNORMAL LOW (ref 0.40–4.50)

## 2021-01-29 NOTE — Progress Notes (Signed)
Blood count is in normal range.  Kidney function:  Increase water intake to improve this  TSH thyroid: slightly low change dose to levothyroxine whole tablet 4 days a week and 1.5tablets 3 days a week.  A1c decreased to 5.7 from 5.8 (pre-diabetes range)  Cholesterol: Triglycerides increased to 179 goal less than 150.  Decreased carbohydrates, white bread, rice pasta, white potatoes and sugars.  Let us know if you have any questions.  Sincerely,           Garnet Sierras, NP

## 2021-02-05 ENCOUNTER — Other Ambulatory Visit: Payer: Self-pay | Admitting: Internal Medicine

## 2021-03-09 ENCOUNTER — Encounter: Payer: Self-pay | Admitting: Podiatry

## 2021-03-09 ENCOUNTER — Ambulatory Visit: Payer: PPO | Admitting: Podiatry

## 2021-03-09 ENCOUNTER — Other Ambulatory Visit: Payer: Self-pay

## 2021-03-09 DIAGNOSIS — M2042 Other hammer toe(s) (acquired), left foot: Secondary | ICD-10-CM

## 2021-03-09 DIAGNOSIS — M79675 Pain in left toe(s): Secondary | ICD-10-CM | POA: Diagnosis not present

## 2021-03-09 DIAGNOSIS — L84 Corns and callosities: Secondary | ICD-10-CM

## 2021-03-09 DIAGNOSIS — B351 Tinea unguium: Secondary | ICD-10-CM

## 2021-03-09 DIAGNOSIS — M79674 Pain in right toe(s): Secondary | ICD-10-CM | POA: Diagnosis not present

## 2021-03-09 DIAGNOSIS — E1142 Type 2 diabetes mellitus with diabetic polyneuropathy: Secondary | ICD-10-CM

## 2021-03-09 DIAGNOSIS — M2041 Other hammer toe(s) (acquired), right foot: Secondary | ICD-10-CM

## 2021-03-09 NOTE — Progress Notes (Signed)
This patient returns to my office for at risk foot care.  This patient requires this care by a professional since this patient will be at risk due to having diabetic neuropathy, kidney disease and venous insufficiency.  This patient is unable to cut nails herself since the patient cannot reach her nails.These nails are painful walking and wearing shoes.  This patient presents for at risk foot care today.  General Appearance  Alert, conversant and in no acute stress.  Vascular  Dorsalis pedis and posterior tibial  pulses are palpable  bilaterally.  Capillary return is within normal limits  bilaterally. Temperature is within normal limits  bilaterally.  Neurologic  Senn-Weinstein monofilament wire test within normal limits  bilaterally. Muscle power within normal limits bilaterally.  Nails Thick disfigured discolored nails with subungual debris  from hallux to fifth toes bilaterally. No evidence of bacterial infection or drainage bilaterally. Pincer hallux nails  B/L.  Orthopedic  No limitations of motion  feet .  No crepitus or effusions noted.  No bony pathology or digital deformities noted.  Skin  normotropic skin with no porokeratosis noted bilaterally.  No signs of infections or ulcers noted.  Asymptomatic callus sub 5th  B/L.  Clavi 3rd toes  B/L   Onychomycosis  Pain in right toes  Pain in left toes  Consent was obtained for treatment procedures.   Mechanical debridement of nails 1-5  bilaterally performed with a nail nipper.  Filed with dremel without incident. Debride clavi with # 15 blade.   Return office visit   9 weeks                  Told patient to return for periodic foot care and evaluation due to potential at risk complications.   Gardiner Barefoot DPM

## 2021-03-15 ENCOUNTER — Other Ambulatory Visit: Payer: Self-pay | Admitting: Internal Medicine

## 2021-03-15 DIAGNOSIS — K219 Gastro-esophageal reflux disease without esophagitis: Secondary | ICD-10-CM

## 2021-03-15 MED ORDER — PANTOPRAZOLE SODIUM 40 MG PO TBEC: DELAYED_RELEASE_TABLET | ORAL | 3 refills | Status: DC

## 2021-03-15 MED ORDER — GABAPENTIN 800 MG PO TABS: ORAL_TABLET | ORAL | 3 refills | Status: DC

## 2021-04-08 ENCOUNTER — Other Ambulatory Visit: Payer: Self-pay

## 2021-04-08 MED ORDER — LEVOTHYROXINE SODIUM 75 MCG PO TABS: ORAL_TABLET | ORAL | 3 refills | Status: DC

## 2021-04-22 DIAGNOSIS — Z03818 Encounter for observation for suspected exposure to other biological agents ruled out: Secondary | ICD-10-CM | POA: Diagnosis not present

## 2021-04-22 DIAGNOSIS — J040 Acute laryngitis: Secondary | ICD-10-CM | POA: Diagnosis not present

## 2021-05-05 ENCOUNTER — Encounter: Payer: Self-pay | Admitting: Internal Medicine

## 2021-05-05 NOTE — Progress Notes (Signed)
Future Appointments  Date Time Provider [place]  05/06/2021  2:30 PM Unk Pinto, MD [facility] None  05/21/2021  4:15 PM Marzetta Board, DPM [facility]  06/07/2021  3:00 PM Buford Dresser, MD [facility]  10/12/2021 - cpe  2:00 PM Liane Comber, NP [facility] None  01/27/2022    -Wellness  2:30 PM Garnet Sierras, NP [facility] None    History of Present Illness:       This very nice 75 y.o.female presents for 3 month follow up with labile HTN, HLD, Pre-Diabetes and Vitamin D Deficiency.        Patient is treated for HTN & BP has been controlled at home. Today's BP is at goal - 100/72.  In Dec 2020, she had anegative Cardiac CT. Patient has had no complaints of any cardiac type chest pain, palpitations, dyspnea / orthopnea / PND, dizziness, claudication, or dependent edema.       Hyperlipidemia is controlled with diet & Atorvastatin.  Patient denies myalgias or other med SE's. Last Lipids were at goal:  Lab Results  Component Value Date   CHOL 125 01/27/2021   HDL 44 (L) 01/27/2021   LDLCALC 55 01/27/2021   TRIG 179 (H) 01/27/2021   CHOLHDL 2.8 01/27/2021   Also, the patient has Morbid Obesity (BMI 37.5)and history of T2_NIDDM managed with diet and has had no symptoms of reactive hypoglycemia, diabetic polys, paresthesias or visual blurring.  Last A1c was near goal:  Lab Results  Component Value Date   HGBA1C 5.7 (H) 01/27/2021                                                    Patient has been on Thyroid Replacement circa 1996.   Further, the patient also has history of Vitamin D Deficiency ("40" on therapy in 2019) and supplements vitamin D without any suspected side-effects. Last vitamin D was near goal (70-100):   Lab Results  Component Value Date   VD25OH 56 05/07/2020     Current Outpatient Medications on File Prior to Visit  Medication Sig   TYLENOL ARTHRITIS P Take as needed   aspirin 81 MG tablet Take daily.    atorvastatin  40 MG tablet Take 1 tablet Daily  for Cholesterol   b complex vitamins tablet Take 1 tablet  daily.   diclofenac 1 % GEL Apply 4 g topically 4 times daily.   COSOPT ophthalmic solution USE 1 DROP  2 TIMES DAILY   ezetimibe (ZETIA) 10 MG tablet Take 1 tablet daily.   famotidine (PEPCID) 40 MG tablet Take 1 tablet at bedtime   furosemide (LASIX) 20 MG tablet Take 1 tablet 2 x /day for BP & Fluid Retention   gabapentin (NEURONTIN) 800 MG tablet Take  1 capsule  3 x /day  for Chronic Pain   levothyroxine (SYNTHROID) 75 MCG tablet Take 1 to 1.5 tablet daily as directed    Multiple Vitamin (MULTIVITAMIN) tablet Take 1 tablet daily.   pantoprazole (PROTONIX) 40 MG tablet Take  1 tablet  Daily  to Prevent Heartburn & Indigestion   promethazine-DM) 6.25-15 MG/5ML syrup Take 5 mLs every 4 (four) hours.   dicyclomine (BENTYL) 20 MG tablet Take 1 tablet   3  times daily as needed for spasms.   NITROSTAT 0.4 MG SL  tablet as needed for chest pain.    Allergies  Allergen Reactions   Ciprofloxacin    Macrobid [Nitrofurantoin Macrocrystal]    Penicillins     REACTION: rash   Procaine Hcl     REACTION: difficulty breathing   Sulfonamide Derivatives     REACTION: hives   Tetanus Toxoid     REACTION: arm swells   Tuberculin Ppd Swelling     PMHx:   Past Medical History:  Diagnosis Date   Anemia    Chest tightness    CKD stage 3 secondary to diabetes ([place]) 10/09/2019   Fatigue    GERD (gastroesophageal reflux disease)    Heart palpitations 12/06/2019   Hyperlipidemia    Hypothyroid    Internal hemorrhoids 05/04/2009   Qualifier: Diagnosis of  By: Nelson-Smith CMA (AAMA), Dottie     Myalgia    Obesity    Prediabetes    Ulcer    Varicose veins      Immunization History  Administered Date(s) Administered   Influenza, High Dose  08/10/2017, 08/14/2018, 08/27/2019, 09/10/2020   Pneumococcal -13 05/12/2014   Pneumococcal -23 09/05/2007, 06/02/2015     Past Surgical  History:  Procedure Laterality Date   ABDOMINAL HYSTERECTOMY     Total    CATARACT EXTRACTION Bilateral    Dr. Tommy Rainwater   CYST REMOVAL HAND Left 07/30/2018   NASAL SINUS SURGERY     ROTATOR CUFF REPAIR Left more than 7 years ago   Dr. Percell Miller and Dr. Earleen Newport   TONSILLECTOMY AND ADENOIDECTOMY      FHx:    Reviewed / unchanged  SHx:    Reviewed / unchanged   Systems Review:  Constitutional: Denies fever, chills, wt changes, headaches, insomnia, fatigue, night sweats, change in appetite. Eyes: Denies redness, blurred vision, diplopia, discharge, itchy, watery eyes.  ENT: Denies discharge, congestion, post nasal drip, epistaxis, sore throat, earache, hearing loss, dental pain, tinnitus, vertigo, sinus pain, snoring.  CV: Denies chest pain, palpitations, irregular heartbeat, syncope, dyspnea, diaphoresis, orthopnea, PND, claudication or edema. Respiratory: denies cough, dyspnea, DOE, pleurisy, hoarseness, laryngitis, wheezing.  Gastrointestinal: Denies dysphagia, odynophagia, heartburn, reflux, water brash, abdominal pain or cramps, nausea, vomiting, bloating, diarrhea, constipation, hematemesis, melena, hematochezia  or hemorrhoids. Genitourinary: Denies dysuria, frequency, urgency, nocturia, hesitancy, discharge, hematuria or flank pain. Musculoskeletal: Denies arthralgias, myalgias, stiffness, jt. swelling, pain, limping or strain/sprain.  Skin: Denies pruritus, rash, hives, warts, acne, eczema or change in skin lesion(s). Neuro: No weakness, tremor, incoordination, spasms, paresthesia or pain. Psychiatric: Denies confusion, memory loss or sensory loss. Endo: Denies change in weight, skin or hair change.  Heme/Lymph: No excessive bleeding, bruising or enlarged lymph nodes.  Physical Exam  BP 100/72   Pulse 98   Temp 98.1 F (36.7 C)   Resp 17   Ht 5\' 7"  (1.702 m)   Wt 216 lb (98 kg)   SpO2 98%   BMI 33.83 kg/m   Appears  well nourished, well groomed  and in no  distress.  Eyes: PERRLA, EOMs, conjunctiva no swelling or erythema. Sinuses: No frontal/maxillary tenderness ENT/Mouth: EAC's clear, TM's nl w/o erythema, bulging. Nares clear w/o erythema, swelling, exudates. Oropharynx clear without erythema or exudates. Oral hygiene is good. Tongue normal, non obstructing. Hearing intact.  Neck: Supple. Thyroid not palpable. Car 2+/2+ without bruits, nodes or JVD. Chest: Respirations nl with BS clear & equal w/o rales, rhonchi, wheezing or stridor.  Cor: Heart sounds normal w/ regular rate and rhythm without sig. murmurs, gallops, clicks or  rubs. Peripheral pulses normal and equal  without edema.  Abdomen: Soft & bowel sounds normal. Non-tender w/o guarding, rebound, hernias, masses or organomegaly.  Lymphatics: Unremarkable.  Musculoskeletal: Full ROM all peripheral extremities, joint stability, 5/5 strength and normal gait.  Skin: Warm, dry without exposed rashes, lesions or ecchymosis apparent.  Neuro: Cranial nerves intact, reflexes equal bilaterally. Sensory-motor testing grossly intact. Tendon reflexes grossly intact.  Pysch: Alert & oriented x 3.  Insight and judgement nl & appropriate. No ideations.  Assessment and Plan:  1. Labile hypertension  - Continue medication, monitor blood pressure at home.  - Continue DASH diet.  Reminder to go to the ER if any CP,  SOB, nausea, dizziness, severe HA, changes vision/speech. - CBC with Differential/Platelet - COMPLETE METABOLIC PANEL WITH GFR - Magnesium - TSH  2. Hyperlipidemia associated with type 2 diabetes mellitus ([place])  - Continue diet/meds, exercise,& lifestyle modifications.  - Continue monitor periodic cholesterol/liver & renal functions  - Lipid panel - TSH  3. Type 2 diabetes mellitus with stage 2 chronic kidney  disease, without long-term current use of insulin ([facility])  - Continue diet, exercise  - Lifestyle modifications.  - Monitor appropriate labs  - Hemoglobin A1c - Insulin,  random  4. Vitamin D deficiency  - Continue supplementation  - VITAMIN D 25 Hydroxy   5. Hypothyroidism  - TSH  6. Medication management  - CBC with Differential/Platelet - COMPLETE METABOLIC PANEL WITH GFR - Magnesium - Lipid panel - TSH - Hemoglobin A1c - Insulin, random - VITAMIN D 25 Hydroxy       Discussed  regular exercise, BP monitoring, weight control to achieve/maintain BMI less than 25 and discussed med and SE's. Recommended labs to assess and monitor clinical status with further disposition pending results of labs.  I discussed the assessment and treatment plan with the patient. The patient was provided an opportunity to ask questions and all were answered. The patient agreed with the plan and demonstrated an understanding of the instructions.  I provided over 30 minutes of exam, counseling, chart review and  complex critical decision making.      The patient was advised to call back or seek an in-person evaluation if the symptoms worsen or if the condition fails to improve as anticipated.   Kirtland Bouchard, MD

## 2021-05-06 ENCOUNTER — Ambulatory Visit (INDEPENDENT_AMBULATORY_CARE_PROVIDER_SITE_OTHER): Payer: PPO | Admitting: Internal Medicine

## 2021-05-06 ENCOUNTER — Other Ambulatory Visit: Payer: Self-pay

## 2021-05-06 VITALS — BP 100/72 | HR 98 | Temp 98.1°F | Resp 17 | Ht 67.0 in | Wt 216.0 lb

## 2021-05-06 DIAGNOSIS — E1122 Type 2 diabetes mellitus with diabetic chronic kidney disease: Secondary | ICD-10-CM

## 2021-05-06 DIAGNOSIS — Z79899 Other long term (current) drug therapy: Secondary | ICD-10-CM

## 2021-05-06 DIAGNOSIS — N182 Chronic kidney disease, stage 2 (mild): Secondary | ICD-10-CM

## 2021-05-06 DIAGNOSIS — E559 Vitamin D deficiency, unspecified: Secondary | ICD-10-CM | POA: Diagnosis not present

## 2021-05-06 DIAGNOSIS — E785 Hyperlipidemia, unspecified: Secondary | ICD-10-CM | POA: Diagnosis not present

## 2021-05-06 DIAGNOSIS — E039 Hypothyroidism, unspecified: Secondary | ICD-10-CM | POA: Diagnosis not present

## 2021-05-06 DIAGNOSIS — E1169 Type 2 diabetes mellitus with other specified complication: Secondary | ICD-10-CM | POA: Diagnosis not present

## 2021-05-06 DIAGNOSIS — R0989 Other specified symptoms and signs involving the circulatory and respiratory systems: Secondary | ICD-10-CM | POA: Diagnosis not present

## 2021-05-06 NOTE — Patient Instructions (Signed)

## 2021-05-07 LAB — CBC WITH DIFFERENTIAL/PLATELET
Absolute Monocytes: 1088 cells/uL — ABNORMAL HIGH (ref 200–950)
Basophils Absolute: 44 cells/uL (ref 0–200)
Basophils Relative: 0.3 %
Eosinophils Absolute: 44 cells/uL (ref 15–500)
Eosinophils Relative: 0.3 %
HCT: 44.5 % (ref 35.0–45.0)
Hemoglobin: 14.7 g/dL (ref 11.7–15.5)
Lymphs Abs: 1798 cells/uL (ref 850–3900)
MCH: 28.8 pg (ref 27.0–33.0)
MCHC: 33 g/dL (ref 32.0–36.0)
MCV: 87.1 fL (ref 80.0–100.0)
MPV: 10.4 fL (ref 7.5–12.5)
Monocytes Relative: 7.5 %
Neutro Abs: 11528 cells/uL — ABNORMAL HIGH (ref 1500–7800)
Neutrophils Relative %: 79.5 %
Platelets: 250 10*3/uL (ref 140–400)
RBC: 5.11 10*6/uL — ABNORMAL HIGH (ref 3.80–5.10)
RDW: 13.1 % (ref 11.0–15.0)
Total Lymphocyte: 12.4 %
WBC: 14.5 10*3/uL — ABNORMAL HIGH (ref 3.8–10.8)

## 2021-05-07 LAB — COMPLETE METABOLIC PANEL WITH GFR
AG Ratio: 1.5 (calc) (ref 1.0–2.5)
ALT: 18 U/L (ref 6–29)
AST: 14 U/L (ref 10–35)
Albumin: 4 g/dL (ref 3.6–5.1)
Alkaline phosphatase (APISO): 103 U/L (ref 37–153)
BUN: 13 mg/dL (ref 7–25)
CO2: 26 mmol/L (ref 20–32)
Calcium: 8.9 mg/dL (ref 8.6–10.4)
Chloride: 102 mmol/L (ref 98–110)
Creat: 0.92 mg/dL (ref 0.60–0.93)
GFR, Est African American: 72 mL/min/{1.73_m2} (ref >=60)
GFR, Est Non African American: 62 mL/min/{1.73_m2} (ref >=60)
Globulin: 2.6 g/dL (calc) (ref 1.9–3.7)
Glucose, Bld: 97 mg/dL (ref 65–99)
Potassium: 4 mmol/L (ref 3.5–5.3)
Sodium: 140 mmol/L (ref 135–146)
Total Bilirubin: 0.8 mg/dL (ref 0.2–1.2)
Total Protein: 6.6 g/dL (ref 6.1–8.1)

## 2021-05-07 LAB — VITAMIN D 25 HYDROXY (VIT D DEFICIENCY, FRACTURES): Vit D, 25-Hydroxy: 72 ng/mL (ref 30–100)

## 2021-05-07 LAB — LIPID PANEL
Cholesterol: 141 mg/dL (ref <200)
HDL: 42 mg/dL — ABNORMAL LOW (ref >=50)
LDL Cholesterol (Calc): 71 mg/dL (calc)
Non-HDL Cholesterol (Calc): 99 mg/dL (calc) (ref <130)
Total CHOL/HDL Ratio: 3.4 (calc) (ref <5.0)
Triglycerides: 211 mg/dL — ABNORMAL HIGH (ref <150)

## 2021-05-07 LAB — INSULIN, RANDOM: Insulin: 64.8 u[IU]/mL — ABNORMAL HIGH

## 2021-05-07 LAB — HEMOGLOBIN A1C
Hgb A1c MFr Bld: 6 % of total Hgb — ABNORMAL HIGH (ref <5.7)
Mean Plasma Glucose: 126 mg/dL
eAG (mmol/L): 7 mmol/L

## 2021-05-07 LAB — MAGNESIUM: Magnesium: 2 mg/dL (ref 1.5–2.5)

## 2021-05-07 LAB — TSH: TSH: 0.75 mIU/L (ref 0.40–4.50)

## 2021-05-08 ENCOUNTER — Encounter: Payer: Self-pay | Admitting: Internal Medicine

## 2021-05-08 ENCOUNTER — Other Ambulatory Visit: Payer: Self-pay | Admitting: Internal Medicine

## 2021-05-08 DIAGNOSIS — K5730 Diverticulosis of large intestine without perforation or abscess without bleeding: Secondary | ICD-10-CM

## 2021-05-08 DIAGNOSIS — R1030 Lower abdominal pain, unspecified: Secondary | ICD-10-CM

## 2021-05-08 DIAGNOSIS — R102 Pelvic and perineal pain: Secondary | ICD-10-CM

## 2021-05-08 MED ORDER — ONDANSETRON HCL 4 MG PO TABS: ORAL_TABLET | ORAL | 1 refills | Status: DC

## 2021-05-08 MED ORDER — DICYCLOMINE HCL 20 MG PO TABS: ORAL_TABLET | ORAL | 1 refills | Status: DC

## 2021-05-08 NOTE — Progress Notes (Signed)
============================================================ - Test results slightly outside the reference range are not unusual. If there is anything important, I will review this with you,  otherwise it is considered normal test values.  If you have further questions,  please do not hesitate to contact me at the office or via My Chart.  ============================================================ ============================================================  -  WBC is a little elevated - if develop persistent fever or other symptoms as   respiratory congestion or urinary symptoms as bladder cramping or   urinary burning , please let me know ============================================================ ============================================================   - Kidney functions  (GFR) still look a little dehydrated    Very important to drink adequate amounts of fluids to prevent permanent damage    - Recommend drink at least 6 bottles (16 ounces) of fluids /water /day = 96 Oz ~100 oz  - 100 oz = 3,000 cc or 3 liters / day  - >> That's 1 &1/2 bottles of a 2 liter soda bottle /day !   ============================================================ ============================================================  -  Total Chol = 141  and LDL Chol = 71 --  Both  Excellent   - Very low risk for Heart Attack  / Stroke ======================================================== But Triglycerides (  211   ) or fats in blood are too high                (  goal is less than 150  )    - Recommend avoid fried & greasy foods,  sweets / candy,   - Avoid white rice  (brown or wild rice or Quinoa is OK),   - Avoid white potatoes  (sweet potatoes are OK)   - Avoid anything made from white flour  - bagels, doughnuts, rolls, buns, biscuits, white and   wheat breads, pizza crust and traditional  pasta made of white flour & egg white  - (vegetarian pasta or spinach or wheat pasta is OK).     - Multi-grain bread is OK - like multi-grain flat bread or  sandwich thins.   - Avoid alcohol in excess.   - Exercise is also important. ============================================================ ============================================================  -  A1c  = 6.0% - Blood sugar and A1c are elevated  higher in the   borderline and early or pre-diabetes range which has the same   300% increased risk for heart attack, stroke, cancer and   alzheimer- type vascular dementia as full blown diabetes.   But the good news is that diet, exercise with  weight loss can cure the early diabetes at this point. ============================================================ ============================================================  -  Also  insulin level = 64.8           (Normal is less than 20)   - and elevated Insulin shows  insulin resistance - a sign of early diabetes and   associated with a 300 % greater risk for heart attacks, strokes, cancer &   Alzheimer type vascular dementia   - All this can be cured  and prevented with losing weight   - get Dr Fara Olden Fuhrman's book 'the End of Diabetes" and "the End of Dieting"    - and add many years of good health to your life. ============================================================ ============================================================  -  Vitamin D = 72 - Excellent  ============================================================ ============================================================  -  All Else - CBC - Kidneys - Electrolytes - Liver - Magnesium & Thyroid    - all  Normal / OK ============================================================ ============================================================

## 2021-05-12 ENCOUNTER — Ambulatory Visit: Payer: PPO | Admitting: Podiatry

## 2021-05-13 ENCOUNTER — Other Ambulatory Visit: Payer: Self-pay | Admitting: Internal Medicine

## 2021-05-13 MED ORDER — DEXAMETHASONE 4 MG PO TABS: ORAL_TABLET | ORAL | 0 refills | Status: DC

## 2021-05-13 MED ORDER — PSEUDOEPHEDRINE HCL ER 120 MG PO TB12: ORAL_TABLET | ORAL | 2 refills | Status: DC

## 2021-05-13 MED ORDER — AZITHROMYCIN 250 MG PO TABS
ORAL_TABLET | ORAL | 1 refills | Status: DC
Start: 2021-05-13 — End: 2021-06-30

## 2021-05-13 MED ORDER — FLUCONAZOLE 150 MG PO TABS: ORAL_TABLET | ORAL | 3 refills | Status: DC

## 2021-05-21 ENCOUNTER — Encounter: Payer: Self-pay | Admitting: Podiatry

## 2021-05-21 ENCOUNTER — Ambulatory Visit: Payer: PPO | Admitting: Podiatry

## 2021-05-21 ENCOUNTER — Other Ambulatory Visit: Payer: Self-pay

## 2021-05-21 DIAGNOSIS — E1142 Type 2 diabetes mellitus with diabetic polyneuropathy: Secondary | ICD-10-CM | POA: Diagnosis not present

## 2021-05-21 DIAGNOSIS — B351 Tinea unguium: Secondary | ICD-10-CM | POA: Diagnosis not present

## 2021-05-21 DIAGNOSIS — M79674 Pain in right toe(s): Secondary | ICD-10-CM | POA: Diagnosis not present

## 2021-05-21 DIAGNOSIS — M79675 Pain in left toe(s): Secondary | ICD-10-CM

## 2021-05-21 DIAGNOSIS — L84 Corns and callosities: Secondary | ICD-10-CM

## 2021-05-24 NOTE — Progress Notes (Signed)
Patient:  [name], female    DOB: Aug 01, 1946,  MRN: [id number] Subjective: [name] presents to clinic today for at risk foot care. Pt has h/o NIDDM with chronic kidney disease and corn(s) bilateral 2nd toes and right 3rd toe , callus(es) b/l geet and painful mycotic nails.  Pain interferes with ambulation. Aggravating factors include wearing enclosed shoe gear. Painful toenails interfere with ambulation. Aggravating factors include wearing enclosed shoe gear. Pain is relieved with periodic professional debridement. Painful corns and calluses are aggravated when weightbearing with and without shoegear. Pain is relieved with periodic professional debridement.  Patient is not required to monitor blood glucose daily.  PCP is Brandi Pinto, MD , and last visit was 05/06/2021.  Allergies  Allergen Reactions   Ciprofloxacin    Macrobid [Nitrofurantoin Macrocrystal]    Penicillins     REACTION: rash   Procaine Hcl     REACTION: difficulty breathing   Sulfonamide Derivatives     REACTION: hives   Tetanus Toxoid     REACTION: arm swells   Tuberculin Ppd Swelling    Review of Systems: Negative except as noted in the HPI. Objective:   Constitutional [name] is a pleasant 75 y.o. Caucasian female, in NAD. AAO x 3.   Vascular Capillary fill time to digits <3 seconds b/l lower extremities. Palpable DP pulse(s) b/l lower extremities Palpable PT pulse(s) b/l lower extremities Pedal hair absent. Lower extremity skin temperature gradient within normal limits. No pain with calf compression b/l. No edema noted b/l lower extremities. No cyanosis or clubbing noted.  Neurologic Normal speech. Oriented to person, place, and time. Protective sensation diminished with 10g monofilament b/l.  Dermatologic Pedal skin with normal turgor, texture and tone b/l lower extremities Toenails 1-5 b/l elongated, discolored, dystrophic, thickened, crumbly with subungual debris and tenderness to dorsal  palpation. Pincer nail deformity R hallux. No erythema, no edema, no drainage, no fluctuance. Nail border hypertrophy present. Hyperkeratotic lesion(s) L hallux, L 2nd toe, R 2nd toe, R 3rd toe, and submet head 5 left foot.  No erythema, no edema, no drainage, no fluctuance. Preulcerative lesion noted R hallux. There is visible subdermal hemorrhage. There is no surrounding erythema, no edema, no drainage, no odor, no fluctuance.  Orthopedic: Normal muscle strength 5/5 to all lower extremity muscle groups bilaterally. Severe hammertoe deformity noted 1-5 bilaterally.   Radiographs: None Assessment:   1. Pain due to onychomycosis of toenails of both feet   2. Corns and callosities   3. Pre-ulcerative calluses   4. Diabetic peripheral neuropathy associated with type 2 diabetes mellitus ([name])    Plan:  -Examined patient. -Patient to continue soft, supportive shoe gear daily. -Toenails 1-5 b/l were debrided in length and girth with sterile nail nippers and dremel without iatrogenic bleeding.  -Offending nail border debrided and curretaged R hallux utilizing sterile nail nipper and currette. Border(s) cleansed with alcohol and Lumicain Hemostatic Solution applied for light bleeding. Light dressing applied. Patient instructed to apply antibiotic ointment to R hallux once daily for 7 days. -Preulcerative lesion pared R hallux.  -Corn(s) L 2nd toe, R 2nd toe, and R 3rd toe and callus(es) submet head 5 left foot were pared utilizing sterile scalpel blade without incident. Total number debrided =4. -Patient to report any pedal injuries to medical professional immediately. -Patient/POA to call should there be question/concern in the interim.  Return in about 9 weeks (around 07/23/2021).  [name], DPM

## 2021-06-07 ENCOUNTER — Ambulatory Visit (HOSPITAL_BASED_OUTPATIENT_CLINIC_OR_DEPARTMENT_OTHER): Payer: PPO | Admitting: Cardiology

## 2021-06-30 ENCOUNTER — Ambulatory Visit (HOSPITAL_BASED_OUTPATIENT_CLINIC_OR_DEPARTMENT_OTHER): Payer: PPO | Admitting: Cardiology

## 2021-06-30 ENCOUNTER — Encounter (HOSPITAL_BASED_OUTPATIENT_CLINIC_OR_DEPARTMENT_OTHER): Payer: Self-pay | Admitting: Cardiology

## 2021-06-30 ENCOUNTER — Other Ambulatory Visit: Payer: Self-pay

## 2021-06-30 VITALS — BP 104/58 | HR 70 | Ht 67.0 in | Wt 221.8 lb

## 2021-06-30 DIAGNOSIS — E1169 Type 2 diabetes mellitus with other specified complication: Secondary | ICD-10-CM | POA: Diagnosis not present

## 2021-06-30 DIAGNOSIS — I10 Essential (primary) hypertension: Secondary | ICD-10-CM | POA: Diagnosis not present

## 2021-06-30 DIAGNOSIS — I2510 Atherosclerotic heart disease of native coronary artery without angina pectoris: Secondary | ICD-10-CM | POA: Diagnosis not present

## 2021-06-30 DIAGNOSIS — Z7189 Other specified counseling: Secondary | ICD-10-CM | POA: Diagnosis not present

## 2021-06-30 DIAGNOSIS — Z8249 Family history of ischemic heart disease and other diseases of the circulatory system: Secondary | ICD-10-CM | POA: Diagnosis not present

## 2021-06-30 DIAGNOSIS — E785 Hyperlipidemia, unspecified: Secondary | ICD-10-CM | POA: Diagnosis not present

## 2021-06-30 NOTE — Progress Notes (Signed)
Cardiology Office Note:    Date:  06/30/2021   ID:  [name], DOB 1946/10/09, MRN [id number]  PCP:  Unk Pinto, MD  Cardiologist:  Buford Dresser, MD  Referring MD: Unk Pinto, MD   CC: follow up  History of Present Illness:    [name] is a 75 y.o. female with a hx of nonobstructive CAD, type II diabetes, hypertension, hyperlipidemia, obesity, GERD who is seen for follow up today. She was initially seen 12/06/19 as a new consult at the request of Unk Pinto, MD for the evaluation and management of palpitations. Also noted to have recent ER visit for chest pain.  Today: Overall, she is feeling good with no new cardiovascular complaints. Her last ER visit was 11/22/2019, and she was told later that her ECG was performed incorrectly.  Her blood pressure might be low in clinic today because she was told to take 2 furosemide as needed when her legs swell in the summer. Since Monday 8/8 she has been taking 2 tablets. She also endorses neuropathy.  She denies any palpitations, chest pain, or shortness of breath. No lightheadedness, headaches, syncope, orthopnea, or PND. Also has no exertional symptoms.   Past Medical History:  Diagnosis Date   Anemia    Chest tightness    CKD stage 3 secondary to diabetes ([place]) 10/09/2019   Fatigue    GERD (gastroesophageal reflux disease)    Heart palpitations 12/06/2019   Hyperlipidemia    Hypothyroid    Internal hemorrhoids 05/04/2009   Qualifier: Diagnosis of  By: Nelson-Smith CMA (AAMA), Dottie     Myalgia    Obesity    Prediabetes    Ulcer    Varicose veins     Past Surgical History:  Procedure Laterality Date   ABDOMINAL HYSTERECTOMY     Total    CATARACT EXTRACTION Bilateral    Dr. Tommy Rainwater   CYST REMOVAL HAND Left 07/30/2018   NASAL SINUS SURGERY     ROTATOR CUFF REPAIR Left more than 7 years ago   Dr. Percell Miller and Dr. Earleen Newport   TONSILLECTOMY AND ADENOIDECTOMY      Current Medications: Current  Outpatient Medications on File Prior to Visit  Medication Sig   Acetaminophen (TYLENOL ARTHRITIS PAIN PO) Take by mouth.   aspirin 81 MG tablet Take 81 mg by mouth daily.   atorvastatin (LIPITOR) 40 MG tablet Take 1 tablet Daily  for Cholesterol   b complex vitamins tablet Take 1 tablet by mouth daily.   dexamethasone (DECADRON) 4 MG tablet Take 1 tab 3 x day - 3 days, then 2 x day - 3 days, then 1 tab daily   diclofenac Sodium (VOLTAREN) 1 % GEL Apply 4 g topically 4 (four) times daily.   dicyclomine (BENTYL) 20 MG tablet Take  1 tablet  3 x /day  if needed for Nausea, Bloating, Cramping or Diarrhea   dorzolamide-timolol (COSOPT) 22.3-6.8 MG/ML ophthalmic solution USE 1 DROP INTO THE AFFECTED EYE 2 TIMES DAILY   ezetimibe (ZETIA) 10 MG tablet Take 1 tablet (10 mg total) by mouth daily.   famotidine (PEPCID) 40 MG tablet Take 1 tablet by mouth at bedtime for indigestion and heartburn   fluconazole (DIFLUCAN) 150 MG tablet Take 1 tablet 2 x /week  if needed for yeast infection   furosemide (LASIX) 20 MG tablet Take 1 tablet 2 x /day for BP & Fluid Retention   gabapentin (NEURONTIN) 800 MG tablet Take  1 capsule  3 x /day  for Chronic Pain   levothyroxine (SYNTHROID) 75 MCG tablet Take 1 to 1.5 tablet daily as directed on an empty stomach with only water for 30 minutes & No Antacid meds, Calcium or Magnesium for 4 hrs   Multiple Vitamin (MULTIVITAMIN) tablet Take 1 tablet by mouth daily.   mupirocin ointment (BACTROBAN) 2 % Apply to wound after soaking BID   ondansetron (ZOFRAN) 4 MG tablet Take  1 tablet  3 x /day  if needed for nausea & Vomiting   pantoprazole (PROTONIX) 40 MG tablet Take  1 tablet  Daily  to Prevent Heartburn & Indigestion   pseudoephedrine (SUDAFED) 120 MG 12 hr tablet Take   1 tablet    2 x /day (every 12 hours)    for Sinus & Chest Congestion   No current facility-administered medications on file prior to visit.     Allergies:   Ciprofloxacin, Macrobid [nitrofurantoin  macrocrystal], Penicillins, Procaine hcl, Sulfonamide derivatives, Tetanus toxoid, and Tuberculin ppd   Social History   Tobacco Use   Smoking status: Former    Packs/day: 0.50    Years: 47.00    Pack years: 23.50    Types: Cigarettes    Start date: 69    Quit date: 05/27/2013    Years since quitting: 8.0   Smokeless tobacco: Never  Vaping Use   Vaping Use: Never used  Substance Use Topics   Alcohol use: Not Currently   Drug use: Never    Family History: family history includes Coronary artery disease (age of onset: 62) in her father; Dementia in her maternal aunt and mother; Lung cancer in her maternal grandmother and mother.  ROS:   Please see the history of present illness.   (+) LE edema Additional pertinent ROS negative except as noted in HPI.   EKGs/Labs/Other Studies Reviewed:    The following studies were reviewed today:  CT cardiac 12/27/19 Aorta: Normal size. Ascending and descending aortic wall calcifications present. No dissection.   Aortic Valve:  Trileaflet.  No calcifications.   Coronary Arteries:  Normal coronary origin.  Right dominance.   RCA is a large dominant artery that gives rise to PDA and PLA. There is calcified plaque in the proximal segment causing mild non obstructive stenosis (25-49%). There is calcified plaque in the distal RCA causing moderate (50-69%) stenosis.   Left main is a large artery that gives rise to LAD and LCX arteries.   LAD is a large vessel that has calcified plaque in its proximal segment causing moderate (50-69%) stenosis.   LCX is a non-dominant artery that gives rise to an OM1 branch. There is calcified plaque the the proximal LCx causing moderate (50%) stenosis.   Other findings:   Normal pulmonary vein drainage into the left atrium.   Normal left atrial appendage without a thrombus.   Normal size of the pulmonary artery.   IMPRESSION: 1. Coronary calcium score of 693. This was 78th percentile for  age and sex matched control.   2. Normal coronary origin with right dominance.   3. Calcified plaque causing moderate stenosis in the prox LAD, distal RCA, proximal LCx.   4.CAD-RADS 3. Moderate stenosis. Consider symptom-guided anti-ischemic pharmacotherapy as well as risk factor modification per guideline directed care. Additional analysis with CT FFR will be submitted and reported separately.  FFR 1. Left Main:  No significant stenosis.  2. LAD: No significant stenosis.  FFR 0.88 3. LCX: No significant stenosis.  FFR 0.9 4. RCA: No significant stenosis.  FFR  0.91   IMPRESSION: 1.  CT FFR analysis didn't show any significant stenosis.  EKG:  EKG is personally reviewed.   06/30/2021: NSR with sinus arrhythmia, poor R wave progression similar to prior 01/03/2020: EKG was not ordered. 12/09/19: sinus bradycardia at 45bpm, no septal R wave.  Recent Labs: 05/06/2021: ALT 18; BUN 13; Creat 0.92; Hemoglobin 14.7; Magnesium 2.0; Platelets 250; Potassium 4.0; Sodium 140; TSH 0.75  Recent Lipid Panel    Component Value Date/Time   CHOL 141 05/06/2021 1420   TRIG 211 (H) 05/06/2021 1420   HDL 42 (L) 05/06/2021 1420   CHOLHDL 3.4 05/06/2021 1420   VLDL 38 (H) 02/27/2017 1703   LDLCALC 71 05/06/2021 1420    Physical Exam:    VS:  BP (!) 104/58   Pulse 70   Ht '5\' 7"'$  (1.702 m)   Wt 221 lb 12.8 oz (100.6 kg)   BMI 34.74 kg/m     Wt Readings from Last 3 Encounters:  06/30/21 221 lb 12.8 oz (100.6 kg)  05/06/21 216 lb (98 kg)  01/27/21 223 lb (101.2 kg)    GEN: Well nourished, well developed in no acute distress HEENT: Normal, moist mucous membranes NECK: No JVD CARDIAC: regular rhythm, normal S1 and S2, no rubs or gallops. No murmur. VASCULAR: Radial and DP pulses 2+ bilaterally. No carotid bruits RESPIRATORY:  Clear to auscultation without rales, wheezing or rhonchi  ABDOMEN: Soft, non-tender, non-distended MUSCULOSKELETAL:  Ambulates independently SKIN: Warm and dry,  trivial bilateral nonpitting edema NEUROLOGIC:  Alert and oriented x 3. No focal neuro deficits noted. PSYCHIATRIC:  Normal affect   ASSESSMENT:    1. Coronary artery disease involving native coronary artery of native heart without angina pectoris   2. Family history of heart disease   3. Type 2 diabetes mellitus with hyperlipidemia ([place])   4. Essential hypertension   5. Cardiac risk counseling   6. Counseling on health promotion and disease prevention     PLAN:    Nonobstructive CAD Family history of heart disease -CT with 3 vessel coronary artery disease without significant stenosis -continue aspirin and high intensity atorvastatin 40 mg -has not required nitroglycerin -discussed red flag symptoms, when to seek emergency medical care  Bilateral leg edema: trivial today -compression stockings previously discussed  Hypertension:goal <130/80 -at goal today, to borderline low, though asymptomatic -continue furosemide, would recommend cutting back to once daily once swelling is stable (takes extra in the summer)  Hyperlipidemia:  -continue atorvastatin 40 mg daily, ezetimibe 10 mg daily -aim for LDL <70 given CAD, last LDL 71 -counseled on lifestyle, below  Type II diabetes: -currently diet controlled, last A1c 6.0 -consider SGLT2i or GLP1RA in the future given CAD  Cardiac risk counseling and prevention recommendations: -recommend heart healthy/Mediterranean diet, with whole grains, fruits, vegetable, fish, lean meats, nuts, and olive oil. Limit salt. -recommend moderate walking, 3-5 times/week for 30-50 minutes each session. Aim for at least 150 minutes.week. Goal should be pace of 3 miles/hours, or walking 1.5 miles in 30 minutes -recommend avoidance of tobacco products. Avoid excess alcohol.  Plan for follow up: 1 year or sooner PRN  Buford Dresser, MD, PhD [facility]   Medication Adjustments/Labs and Tests Ordered: Current medicines are  reviewed at length with the patient today.  Concerns regarding medicines are outlined above.   Orders Placed This Encounter  Procedures   EKG 12-Lead    No orders of the defined types were placed in this encounter.  Patient  Instructions  Medication Instructions:  Your Physician recommend you continue on your current medication as directed.    *If you need a refill on your cardiac medications before your next appointment, please call your pharmacy*   Lab Work: None ordered today   Testing/Procedures: None ordered    Follow-Up: At [facility], you and your health needs are our priority.  As part of our continuing mission to provide you with exceptional heart care, we have created designated Provider Care Teams.  These Care Teams include your primary Cardiologist (physician) and Advanced Practice Providers (APPs -  Physician Assistants and Nurse Practitioners) who all work together to provide you with the care you need, when you need it.  We recommend signing up for the patient portal called "MyChart".  Sign up information is provided on this After Visit Summary.  MyChart is used to connect with patients for Virtual Visits (Telemedicine).  Patients are able to view lab/test results, encounter notes, upcoming appointments, etc.  Non-urgent messages can be sent to your provider as well.   To learn more about what you can do with MyChart, go to [email or web address].    Your next appointment:   1 year(s)  The format for your next appointment:   In Person  Provider:   Buford Dresser, MD      [facility],acting as a scribe for Buford Dresser, MD.,have documented all relevant documentation on the behalf of Buford Dresser, MD,as directed by  Buford Dresser, MD while in the presence of Buford Dresser, MD.  I, Buford Dresser, MD, have reviewed all documentation for this visit. The documentation on 06/30/21 for the exam, diagnosis,  procedures, and orders are all accurate and complete.   Signed, Buford Dresser, MD PhD 06/30/2021 7:08 PM    [place]

## 2021-06-30 NOTE — Patient Instructions (Signed)
Medication Instructions:  Your Physician recommend you continue on your current medication as directed.    *If you need a refill on your cardiac medications before your next appointment, please call your pharmacy*   Lab Work: None ordered today   Testing/Procedures: None ordered    Follow-Up: At [facility], you and your health needs are our priority.  As part of our continuing mission to provide you with exceptional heart care, we have created designated Provider Care Teams.  These Care Teams include your primary Cardiologist (physician) and Advanced Practice Providers (APPs -  Physician Assistants and Nurse Practitioners) who all work together to provide you with the care you need, when you need it.  We recommend signing up for the patient portal called "MyChart".  Sign up information is provided on this After Visit Summary.  MyChart is used to connect with patients for Virtual Visits (Telemedicine).  Patients are able to view lab/test results, encounter notes, upcoming appointments, etc.  Non-urgent messages can be sent to your provider as well.   To learn more about what you can do with MyChart, go to [email or web address].    Your next appointment:   1 year(s)  The format for your next appointment:   In Person  Provider:   Buford Dresser, MD

## 2021-07-16 ENCOUNTER — Other Ambulatory Visit: Payer: Self-pay | Admitting: Internal Medicine

## 2021-07-16 DIAGNOSIS — I10 Essential (primary) hypertension: Secondary | ICD-10-CM

## 2021-07-16 MED ORDER — FUROSEMIDE 20 MG PO TABS: ORAL_TABLET | ORAL | 3 refills | Status: DC

## 2021-08-10 ENCOUNTER — Ambulatory Visit: Payer: PPO | Admitting: Podiatry

## 2021-08-10 ENCOUNTER — Other Ambulatory Visit: Payer: Self-pay

## 2021-08-10 ENCOUNTER — Encounter: Payer: Self-pay | Admitting: Podiatry

## 2021-08-10 DIAGNOSIS — B351 Tinea unguium: Secondary | ICD-10-CM | POA: Diagnosis not present

## 2021-08-10 DIAGNOSIS — L84 Corns and callosities: Secondary | ICD-10-CM

## 2021-08-10 DIAGNOSIS — M79674 Pain in right toe(s): Secondary | ICD-10-CM | POA: Diagnosis not present

## 2021-08-10 DIAGNOSIS — M79675 Pain in left toe(s): Secondary | ICD-10-CM | POA: Diagnosis not present

## 2021-08-10 DIAGNOSIS — E1142 Type 2 diabetes mellitus with diabetic polyneuropathy: Secondary | ICD-10-CM | POA: Diagnosis not present

## 2021-08-10 DIAGNOSIS — E785 Hyperlipidemia, unspecified: Secondary | ICD-10-CM

## 2021-08-10 MED ORDER — ATORVASTATIN CALCIUM 40 MG PO TABS: ORAL_TABLET | ORAL | 3 refills | Status: DC

## 2021-08-10 NOTE — Patient Instructions (Signed)
[employer]os Oomg Eezee Low Shoes with stretchable uppers and memory foam insoles can be purchased online at: www.[email or web address].com. They can also be purchased at [employer] shoe store ([employer]), [employer] or [employer].  Also available in clogs, houseslippers, slide styles. 

## 2021-08-15 NOTE — Progress Notes (Signed)
  Subjective:  Patient ID: [name], female    DOB: May 01, 1946,  MRN: [id number]  75 y.o. female presents at risk foot care with history of diabetic neuropathy, corn(s) right foot , callus(es) left foot and painful mycotic nails.  Pain interferes with ambulation. Aggravating factors include wearing enclosed shoe gear. Painful toenails interfere with ambulation. Aggravating factors include wearing enclosed shoe gear. Pain is relieved with periodic professional debridement. Painful corns and calluses are aggravated when weightbearing with and without shoegear. Pain is relieved with periodic professional debridement. She also has preulcerative callus right hallux.  PCP is Unk Pinto, MD , and last visit was 05/06/2021.  Allergies  Allergen Reactions   Ciprofloxacin    Macrobid [Nitrofurantoin Macrocrystal]    Penicillins     REACTION: rash   Procaine Hcl     REACTION: difficulty breathing   Sulfonamide Derivatives     REACTION: hives   Tetanus Toxoid     REACTION: arm swells   Tuberculin Ppd Swelling    Review of Systems: Negative except as noted in the HPI.   Objective:  Vascular Examination: Vascular status intact b/l with palpable pedal pulses b/l.  CFT<3 seconds b/l.  No edema. No pain with calf compression b/l. +Nonpitting edema b/l.LE. Skin temperature gradient WNL b/l.   Neurological Examination: Sensation diminished with 10 gram monofilament b/l.  Vibratory sensation intact b/l.   Dermatological Examination: Pedal skin with normal turgor, texture and tone b/l. Toenails 1-5 b/l thick, discolored, elongated with subungual debris and pain on dorsal palpation.Hyperkeratotic lesion(s) L hallux, L 2nd toe, R 2nd toe, R 3rd toe, and submet head 5 left foot.  No erythema, no edema, no drainage, no fluctuance. Preulcerative lesion noted R hallux. There is visible subdermal hemorrhage. There is no surrounding erythema, no edema, no drainage, no odor, no  fluctuance.  Musculoskeletal Examination: Muscle strength 5/5 to b/l LE. Severe hammertoe deformity 1-5 b/l.  Radiographs: None Assessment:   1. Pain due to onychomycosis of toenails of both feet   2. Corns and callosities   3. Pre-ulcerative calluses   4. Diabetic peripheral neuropathy associated with type 2 diabetes mellitus ([place])    Plan:  -Examined patient. -Patient to continue soft, supportive shoe gear daily. -Toenails 1-5 b/l were debrided in length and girth with sterile nail nippers and dremel without iatrogenic bleeding.  -Corn(s) L hallux, L 2nd toe, R 2nd toe, and R 3rd toe and callus(es) submet head 5 left foot were pared utilizing sterile scalpel blade without incident. Total number debrided =5. -Preulcerative lesion pared R hallux. Total number pared=1. -Patient to report any pedal injuries to medical professional immediately. -Recommended Oofos Oomg Eezee Low shoes with stretchable uppers and memory foam insoles. -Patient/POA to call should there be question/concern in the interim.  Return in about 9 weeks (around 10/12/2021).  Marzetta Board, DPM

## 2021-09-13 DIAGNOSIS — H401131 Primary open-angle glaucoma, bilateral, mild stage: Secondary | ICD-10-CM | POA: Diagnosis not present

## 2021-09-16 ENCOUNTER — Other Ambulatory Visit: Payer: Self-pay | Admitting: Internal Medicine

## 2021-09-16 MED ORDER — FAMOTIDINE 40 MG PO TABS: ORAL_TABLET | ORAL | 3 refills | Status: DC

## 2021-10-01 NOTE — Progress Notes (Signed)
CPE  Assessment and Plan:   Encounter for general adult medical examination with abnormal findings 1 year Schedule colonoscopy Requested diabetic eye exam report Schedule covid 19 with [place] [facility]  Controlled type 2 diabetes mellitus with complication, without long-term current use of insulin ([place]) Discussed general issues about diabetes pathophysiology and management., Educational material distributed., Suggested low cholesterol diet., Encouraged aerobic exercise., Discussed foot care., Reminded to get yearly retinal exam. -     Hemoglobin A1c ([facility])  Hyperlipidemia associated with T2DM (HCC) check lipids, statin/zetia for LDL goal <70 decrease fatty foods increase activity.  -    Lipid panel   CKD stage 2 secondary to diabetes (HCC) Increase fluids, avoid NSAIDS, monitor sugars, will monitor -    CMP/GFR -    UA/microalbumin  Atherosclerosis of aorta (HCC) Control blood pressure, cholesterol, glucose, increase exercise.   CAD Cardiology following; defer EKG to them Control blood pressure, cholesterol, glucose, increase exercise.  Go to the ER if any chest pain, shortness of breath, nausea, dizziness.  Morbid obesity ([place]) - follow up 3 months for progress monitoring - increase veggies, decrease carbs - long discussion about weight loss, diet, and exercise  Labile hypertension - continue medications, DASH diet, exercise and monitor at home. Call if greater than 130/80.  -     CBC with Diff -     COMPLETE METABOLIC PANEL WITH GFR -     Urinalysis, Routine w reflex microscopic -     Microalbumin / Creatinine Urine Ratio  Hypothyroidism, unspecified type -     TSH Hypothyroidism-check TSH level, continue medications the same, reminded to take on an empty stomach 30-18mins before food.   Medication management -    Magnesium  Vitamin D deficiency -     Vitamin D (25 hydroxy)  History of tobacco use -23 pack year Monitor, had benign CT 12/2019  Chronic  venous insufficiency - weight loss discussed, continue compression stockings and elevation  History of basal cell carcinoma (BCC) Follow up derm  Gastroesophageal reflux disease with esophagitis without hemorrhage Continue PPI/H2 blocker, diet discussed  Diverticulosis of large intestine without hemorrhage Bowel management, soluble fiber, monitor   Peripheral autonomic neuropathy of unknown cause Monitor Continue Gabapentin  Primary osteoarthritis of knee, unspecified laterality Ortho following, monitoring at this time   Flu Vaccine need High Dose Flu vaccine given   Continue diet and meds as discussed. Further disposition pending results of labs. Over 30 minutes of exam, counseling, chart review, and critical decision making was performed  Future Appointments  Date Time Provider [place]  10/19/2021  1:45 PM Marzetta Board, DPM [facility]  12/20/2021  1:00 PM Marzetta Board, DPM [facility]  01/27/2022  2:30 PM Crimson Dubberly, Townsend Roger, NP [facility] None    HPI 75 y.o. female  presents for CPE. She has Diverticulosis of large intestine; History of tobacco use; Hypothyroidism; Controlled type 2 diabetes mellitus with complication, without long-term current use of insulin ([place]); Morbid obesity ([place]); Chronic venous insufficiency; Peripheral autonomic neuropathy of unknown cause; Labile hypertension; History of basal cell carcinoma (BCC); Osteoarthritis of knee; Atherosclerosis of aorta ([place]); Vitamin D deficiency; Medication management; Gastroesophageal reflux disease with esophagitis; Type 2 diabetes mellitus with hyperlipidemia ([place]); CKD stage 3 secondary to diabetes ([place]); Family history of heart disease; and CAD (coronary artery disease) on their problem list.  She is widowed, 3 grandchildren, 1 great grandson - 51 months.  She is retired from [employer] -   She does have chronic  knee pain and is using Tylenol and Voltaren gel with moderate relief.    GERD controlled on protonix and famotidine.   She is former smoker - 23.5 pack year hisotry, quit 2014, strong family hx of llung cancer, she had CT chest for coronary in 12/26/2019 which showed benign appearing nodules.  BMI is Body mass index is 34.8 kg/m., she is working on diet and exercise, trying to watch what she eats and smaller portions. Down from 246 lb in 07/2018.  Wt Readings from Last 3 Encounters:  10/12/21 222 lb 3.2 oz (100.8 kg)  06/30/21 221 lb 12.8 oz (100.6 kg)  05/06/21 216 lb (98 kg)   She is following with Dr. Andree Elk for CAD; in 01/2020 she had cardiac CT showing CCS of 693, 78th % for age/gender, Calcified plaque causing moderate stenosis in the prox LAD, distal RCA, proximal LCx, moderate stenosis, and also aortic atherosclerosis   Her blood pressure has been controlled at home, has been good, today their BP is BP: 110/62  She does workout. She denies chest pain, shortness of breath, dizziness.  She has been working on diet and exercise for Diabetes, doing lifestyle changes  with diabetic chronic kidney disease she is on ACE/ARB With hyperlipidemia IS at goal less than 70, she is on lipitor 40 mg and zetia 10 mg  she is on bASA she has idiopathic paraesthsias and is on gabapentin- follows with Dr. Adah Perl every 90 days, did have infection/ulcer of left second toe in the past, no recent issues, checks feet daily  Eye exam Dr. Nicki Reaper - saw recently, report requested  denies polydipsia, polyuria and visual disturbances.   Last A1C was:  Lab Results  Component Value Date   HGBA1C 6.0 (H) 05/06/2021   Lab Results  Component Value Date   GFRNONAA 62 05/06/2021   Lab Results  Component Value Date   CHOL 141 05/06/2021   HDL 42 (L) 05/06/2021   LDLCALC 71 05/06/2021   TRIG 211 (H) 05/06/2021   CHOLHDL 3.4 05/06/2021   Patient is on Vitamin D supplement.   Lab Results  Component Value Date   VD25OH 72 05/06/2021     She is on thyroid medication.  Her medication was not changed last visit. 75 mcg 1.5 tabs 4 days a week, 1 tab 3 days a week, takes on empty stomach with water only.   Lab Results  Component Value Date   TSH 0.75 05/06/2021  .     Current Medications:   Current Outpatient Medications (Endocrine & Metabolic):    levothyroxine (SYNTHROID) 75 MCG tablet, Take 1 to 1.5 tablet daily as directed on an empty stomach with only water for 30 minutes & No Antacid meds, Calcium or Magnesium for 4 hrs  Current Outpatient Medications (Cardiovascular):    atorvastatin (LIPITOR) 40 MG tablet, Take 1 tablet Daily  for Cholesterol   ezetimibe (ZETIA) 10 MG tablet, Take 1 tablet (10 mg total) by mouth daily.   furosemide (LASIX) 20 MG tablet, Take 1 tablet 2 x /day for BP & Fluid Retention / Patient knows to take by mouth  Current Outpatient Medications (Respiratory):    pseudoephedrine (SUDAFED) 120 MG 12 hr tablet, Take   1 tablet    2 x /day (every 12 hours)    for Sinus & Chest Congestion (Patient not taking: Reported on 10/12/2021)  Current Outpatient Medications (Analgesics):    Acetaminophen (TYLENOL ARTHRITIS PAIN PO), Take by mouth.   aspirin 81 MG tablet, Take  81 mg by mouth daily.   Current Outpatient Medications (Other):    b complex vitamins tablet, Take 1 tablet by mouth daily.   diclofenac Sodium (VOLTAREN) 1 % GEL, Apply 4 g topically 4 (four) times daily.   dicyclomine (BENTYL) 20 MG tablet, Take  1 tablet  3 x /day  if needed for Nausea, Bloating, Cramping or Diarrhea   dorzolamide-timolol (COSOPT) 22.3-6.8 MG/ML ophthalmic solution, USE 1 DROP INTO THE AFFECTED EYE 2 TIMES DAILY   gabapentin (NEURONTIN) 800 MG tablet, Take  1 capsule  3 x /day  for Chronic Pain   Multiple Vitamin (MULTIVITAMIN) tablet, Take 1 tablet by mouth daily.   ondansetron (ZOFRAN) 4 MG tablet, Take  1 tablet  3 x /day  if needed for nausea & Vomiting   pantoprazole (PROTONIX) 40 MG tablet, Take  1 tablet  Daily  to Prevent Heartburn &  Indigestion   fluconazole (DIFLUCAN) 150 MG tablet, Take 1 tablet 2 x /week  if needed for yeast infection  Medical History:  Past Medical History:  Diagnosis Date   Anemia    Chest tightness    CKD stage 3 secondary to diabetes ([place]) 10/09/2019   Fatigue    GERD (gastroesophageal reflux disease)    Heart palpitations 12/06/2019   Hyperlipidemia    Hypothyroid    Internal hemorrhoids 05/04/2009   Qualifier: Diagnosis of  By: Nelson-Smith CMA (AAMA), Dottie     Myalgia    Obesity    Prediabetes    Ulcer    Varicose veins    Health Maintenance:   Immunization History  Administered Date(s) Administered   Influenza, High Dose Seasonal PF 08/19/2014, 09/10/2015, 07/26/2016, 08/10/2017, 08/14/2018, 08/27/2019, 09/10/2020   Pneumococcal Conjugate-13 05/12/2014   Pneumococcal Polysaccharide-23 09/05/2007, 06/02/2015   Preventative care: Last colonoscopy: 2011, Due 2021, Dr. Kalman Shan, [place],refuses another  Last mammogram: 12/02/2020 Negative Last pap smear/pelvic exam: 02/2011- declines another DEXA: 07/2012, normal  Heart cath June 1999, no blockages, coronary spasms CTA 12/2019 IMPRESSION: 1. Coronary calcium score of 693. This was 78th percentile for age and sex matched control. 2. Normal coronary origin with right dominance. 3. Calcified plaque causing moderate stenosis in the prox LAD, distal RCA, proximal LCx. 4.CAD-RADS 3. Moderate stenosis. Consider symptom-guided anti-ischemic pharmacotherapy as well as risk factor modification per guideline directed care. Additional analysis with CT FFR will be submitted and reported separately.  Prior vaccinations: TD or Tdap: ALLERGY- just swollen arm  Influenza: 08/2020 Pneumococcal: 2016 Prevnar13: 2015  Shingrix 45 dollars a shot Covid 19: plans to get [facility]   Derm follows [facility], hx of melanoma to R sh[facility], goes annually, last 2021 Dr. Nicki Reaper for eye care, checking for glaucoma,  08/2021   Patient Care Team: Unk Pinto, MD as PCP - General (Internal Medicine) Buford Dresser, MD as PCP - Cardiology (Cardiology) Macarthur Critchley, [place] as Referring Physician (Optometry) Delfina Redwood as Referring Physician (Physician Assistant)  Allergies Allergies  Allergen Reactions   Ciprofloxacin    Macrobid [Nitrofurantoin Macrocrystal]    Penicillins     REACTION: rash   Procaine Hcl     REACTION: difficulty breathing   Sulfonamide Derivatives     REACTION: hives   Tetanus Toxoid     REACTION: arm swells   Tuberculin Ppd Swelling    SURGICAL HISTORY She  has a past surgical history that includes Abdominal hysterectomy; Tonsillectomy and adenoidectomy; Cataract extraction (Bilateral); Nasal sinus surgery; Rotator cuff repair (Left, more than 7 years  ago); and Cyst removal hand (Left, 07/30/2018). FAMILY HISTORY Her family history includes Coronary artery disease (age of onset: 61) in her father; Dementia in her maternal aunt and mother; Lung cancer in her maternal grandmother and mother. SOCIAL HISTORY She  reports that she quit smoking about 8 years ago. Her smoking use included cigarettes. She started smoking about 55 years ago. She has a 23.50 pack-year smoking history. She has never used smokeless tobacco. She reports that she does not currently use alcohol. She reports that she does not use drugs.   Review of Systems:  Review of Systems  Constitutional:  Negative for chills, fever, malaise/fatigue and weight loss.  HENT:  Negative for congestion, hearing loss, sinus pain, sore throat and tinnitus.   Eyes:  Negative for blurred vision and double vision.  Respiratory:  Negative for cough, hemoptysis, sputum production, shortness of breath and wheezing.   Cardiovascular:  Negative for chest pain, palpitations, orthopnea, claudication, leg swelling and PND.  Gastrointestinal:  Negative for abdominal pain, blood in stool, constipation, diarrhea,  heartburn, melena, nausea and vomiting.  Genitourinary: Negative.  Negative for dysuria and urgency.  Musculoskeletal:  Negative for back pain, falls, joint pain, myalgias and neck pain.  Skin:  Negative for rash.  Neurological:  Positive for tingling (bil feet). Negative for dizziness, tremors, sensory change, weakness and headaches.  Endo/Heme/Allergies:  Negative for polydipsia. Does not bruise/bleed easily.  Psychiatric/Behavioral: Negative.  Negative for depression, memory loss, substance abuse and suicidal ideas. The patient is not nervous/anxious and does not have insomnia.   All other systems reviewed and are negative.  Physical Exam: BP 110/62   Pulse 64   Temp 97.7 F (36.5 C)   Wt 222 lb 3.2 oz (100.8 kg)   SpO2 96%   BMI 34.80 kg/m  Wt Readings from Last 3 Encounters:  10/12/21 222 lb 3.2 oz (100.8 kg)  06/30/21 221 lb 12.8 oz (100.6 kg)  05/06/21 216 lb (98 kg)   General Appearance: Well nourished, in no apparent distress. Eyes: PERRLA, EOMs, conjunctiva no swelling or erythema Sinuses: No Frontal/maxillary tenderness ENT/Mouth: Ext aud canals clear, TMs without erythema, bulging. No erythema, swelling, or exudate on post pharynx.  Tonsils not swollen or erythematous. Hearing normal.  Neck: Supple, thyroid normal.  Respiratory: Respiratory effort normal, BS equal bilaterally without rales, rhonchi, wheezing or stridor.  Cardio: RRR with no MRGs. Brisk peripheral pulses with mild edema bil lower legs  Abdomen: Soft, + BS,  Non tender, no guarding, rebound, hernias, masses. Lymphatics: Non tender without lymphadenopathy.  Musculoskeletal: Full ROM, 5/5 strength, normal steady gait Skin:  Warm, dry without rashes, lesions, ecchymosis.  Neuro: Cranial nerves intact. Normal muscle tone, no cerebellar symptoms.Bil feet with diminished sensation throughout to monofilament Psych: Awake and oriented X 3, normal affect, Insight and Judgment appropriate.  Breasts: declines  today GU: no concerns, defer  EKG: gets by cardiology, defer  Jernard Reiber Kathyrn Drown, NP 2:29 PM [facility]

## 2021-10-11 ENCOUNTER — Ambulatory Visit: Payer: PPO | Admitting: Podiatry

## 2021-10-12 ENCOUNTER — Other Ambulatory Visit: Payer: Self-pay

## 2021-10-12 ENCOUNTER — Encounter: Payer: Self-pay | Admitting: Nurse Practitioner

## 2021-10-12 ENCOUNTER — Ambulatory Visit (INDEPENDENT_AMBULATORY_CARE_PROVIDER_SITE_OTHER): Payer: PPO | Admitting: Nurse Practitioner

## 2021-10-12 VITALS — BP 110/62 | HR 64 | Temp 97.7°F | Wt 222.2 lb

## 2021-10-12 DIAGNOSIS — E785 Hyperlipidemia, unspecified: Secondary | ICD-10-CM | POA: Diagnosis not present

## 2021-10-12 DIAGNOSIS — E118 Type 2 diabetes mellitus with unspecified complications: Secondary | ICD-10-CM | POA: Diagnosis not present

## 2021-10-12 DIAGNOSIS — E039 Hypothyroidism, unspecified: Secondary | ICD-10-CM

## 2021-10-12 DIAGNOSIS — E559 Vitamin D deficiency, unspecified: Secondary | ICD-10-CM | POA: Diagnosis not present

## 2021-10-12 DIAGNOSIS — Z85828 Personal history of other malignant neoplasm of skin: Secondary | ICD-10-CM

## 2021-10-12 DIAGNOSIS — I2510 Atherosclerotic heart disease of native coronary artery without angina pectoris: Secondary | ICD-10-CM

## 2021-10-12 DIAGNOSIS — K2100 Gastro-esophageal reflux disease with esophagitis, without bleeding: Secondary | ICD-10-CM | POA: Diagnosis not present

## 2021-10-12 DIAGNOSIS — G9009 Other idiopathic peripheral autonomic neuropathy: Secondary | ICD-10-CM

## 2021-10-12 DIAGNOSIS — Z23 Encounter for immunization: Secondary | ICD-10-CM | POA: Diagnosis not present

## 2021-10-12 DIAGNOSIS — N182 Chronic kidney disease, stage 2 (mild): Secondary | ICD-10-CM | POA: Diagnosis not present

## 2021-10-12 DIAGNOSIS — I10 Essential (primary) hypertension: Secondary | ICD-10-CM

## 2021-10-12 DIAGNOSIS — E1169 Type 2 diabetes mellitus with other specified complication: Secondary | ICD-10-CM | POA: Diagnosis not present

## 2021-10-12 DIAGNOSIS — E1122 Type 2 diabetes mellitus with diabetic chronic kidney disease: Secondary | ICD-10-CM | POA: Diagnosis not present

## 2021-10-12 DIAGNOSIS — I700 Atherosclerosis of aorta: Secondary | ICD-10-CM

## 2021-10-12 DIAGNOSIS — Z79899 Other long term (current) drug therapy: Secondary | ICD-10-CM

## 2021-10-12 DIAGNOSIS — Z0000 Encounter for general adult medical examination without abnormal findings: Secondary | ICD-10-CM | POA: Diagnosis not present

## 2021-10-12 DIAGNOSIS — K5730 Diverticulosis of large intestine without perforation or abscess without bleeding: Secondary | ICD-10-CM

## 2021-10-12 DIAGNOSIS — I872 Venous insufficiency (chronic) (peripheral): Secondary | ICD-10-CM

## 2021-10-12 NOTE — Patient Instructions (Signed)

## 2021-10-13 LAB — COMPLETE METABOLIC PANEL WITH GFR
AG Ratio: 1.6 (calc) (ref 1.0–2.5)
ALT: 14 U/L (ref 6–29)
AST: 17 U/L (ref 10–35)
Albumin: 4.1 g/dL (ref 3.6–5.1)
Alkaline phosphatase (APISO): 94 U/L (ref 37–153)
BUN/Creatinine Ratio: 12 (calc) (ref 6–22)
BUN: 14 mg/dL (ref 7–25)
CO2: 29 mmol/L (ref 20–32)
Calcium: 9.5 mg/dL (ref 8.6–10.4)
Chloride: 105 mmol/L (ref 98–110)
Creat: 1.17 mg/dL — ABNORMAL HIGH (ref 0.60–1.00)
Globulin: 2.5 g/dL (calc) (ref 1.9–3.7)
Glucose, Bld: 85 mg/dL (ref 65–99)
Potassium: 4.5 mmol/L (ref 3.5–5.3)
Sodium: 144 mmol/L (ref 135–146)
Total Bilirubin: 0.7 mg/dL (ref 0.2–1.2)
Total Protein: 6.6 g/dL (ref 6.1–8.1)
eGFR: 49 mL/min/{1.73_m2} — ABNORMAL LOW (ref >=60)

## 2021-10-13 LAB — URINALYSIS, ROUTINE W REFLEX MICROSCOPIC
Bilirubin Urine: NEGATIVE
Glucose, UA: NEGATIVE
Hgb urine dipstick: NEGATIVE
Ketones, ur: NEGATIVE
Leukocytes,Ua: NEGATIVE
Nitrite: NEGATIVE
Protein, ur: NEGATIVE
Specific Gravity, Urine: 1.013 (ref 1.001–1.035)
pH: <=5 (ref 5.0–8.0)

## 2021-10-13 LAB — CBC WITH DIFFERENTIAL/PLATELET
Absolute Monocytes: 693 cells/uL (ref 200–950)
Basophils Absolute: 54 cells/uL (ref 0–200)
Basophils Relative: 0.6 %
Eosinophils Absolute: 99 cells/uL (ref 15–500)
Eosinophils Relative: 1.1 %
HCT: 42.4 % (ref 35.0–45.0)
Hemoglobin: 13.9 g/dL (ref 11.7–15.5)
Lymphs Abs: 3141 cells/uL (ref 850–3900)
MCH: 28.8 pg (ref 27.0–33.0)
MCHC: 32.8 g/dL (ref 32.0–36.0)
MCV: 88 fL (ref 80.0–100.0)
MPV: 10.6 fL (ref 7.5–12.5)
Monocytes Relative: 7.7 %
Neutro Abs: 5013 cells/uL (ref 1500–7800)
Neutrophils Relative %: 55.7 %
Platelets: 276 10*3/uL (ref 140–400)
RBC: 4.82 10*6/uL (ref 3.80–5.10)
RDW: 13 % (ref 11.0–15.0)
Total Lymphocyte: 34.9 %
WBC: 9 10*3/uL (ref 3.8–10.8)

## 2021-10-13 LAB — LIPID PANEL
Cholesterol: 129 mg/dL (ref <200)
HDL: 42 mg/dL — ABNORMAL LOW (ref >=50)
LDL Cholesterol (Calc): 62 mg/dL (calc)
Non-HDL Cholesterol (Calc): 87 mg/dL (calc) (ref <130)
Total CHOL/HDL Ratio: 3.1 (calc) (ref <5.0)
Triglycerides: 169 mg/dL — ABNORMAL HIGH (ref <150)

## 2021-10-13 LAB — VITAMIN D 25 HYDROXY (VIT D DEFICIENCY, FRACTURES): Vit D, 25-Hydroxy: 77 ng/mL (ref 30–100)

## 2021-10-13 LAB — TSH: TSH: 0.45 mIU/L (ref 0.40–4.50)

## 2021-10-13 LAB — HEMOGLOBIN A1C
Hgb A1c MFr Bld: 5.9 % of total Hgb — ABNORMAL HIGH (ref <5.7)
Mean Plasma Glucose: 123 mg/dL
eAG (mmol/L): 6.8 mmol/L

## 2021-10-13 LAB — MICROALBUMIN / CREATININE URINE RATIO
Creatinine, Urine: 89 mg/dL (ref 20–275)
Microalb Creat Ratio: 7 mcg/mg creat (ref <30)
Microalb, Ur: 0.6 mg/dL

## 2021-10-13 LAB — MAGNESIUM: Magnesium: 2.2 mg/dL (ref 1.5–2.5)

## 2021-10-19 ENCOUNTER — Other Ambulatory Visit: Payer: Self-pay

## 2021-10-19 ENCOUNTER — Ambulatory Visit: Payer: PPO | Admitting: Podiatry

## 2021-10-19 DIAGNOSIS — E1142 Type 2 diabetes mellitus with diabetic polyneuropathy: Secondary | ICD-10-CM

## 2021-10-19 DIAGNOSIS — M2041 Other hammer toe(s) (acquired), right foot: Secondary | ICD-10-CM

## 2021-10-19 DIAGNOSIS — E119 Type 2 diabetes mellitus without complications: Secondary | ICD-10-CM | POA: Diagnosis not present

## 2021-10-19 DIAGNOSIS — M2042 Other hammer toe(s) (acquired), left foot: Secondary | ICD-10-CM | POA: Diagnosis not present

## 2021-10-19 DIAGNOSIS — B351 Tinea unguium: Secondary | ICD-10-CM

## 2021-10-19 DIAGNOSIS — M79674 Pain in right toe(s): Secondary | ICD-10-CM

## 2021-10-19 DIAGNOSIS — L84 Corns and callosities: Secondary | ICD-10-CM

## 2021-10-19 DIAGNOSIS — M79675 Pain in left toe(s): Secondary | ICD-10-CM

## 2021-10-19 NOTE — Progress Notes (Signed)
Patient:   [name]   Date:    10/20/2021  MRN:  [id number]  HISTORY OF PRESENT ILLNESS: [name] is a 75 y.o. with Type 2 Diabetes diagnosed several years. She has been diagnosed with neuropathy which is managed with gabapentin. She is a seen for annual diabetic foot examination.  She is referred by Dr. Unk Pinto.  She is alone for today's appointment.    Do you have any pain in your calf muscles when walking?  No Do you have any pain in your foot or feet?  Yes  If yes, Bunion and hammertoes. Have you had any problems with your feet since the last visit?  No.   Do you have any sores on your feet?  No.  Have you noticed any blood or discharge on your socks or hose?  No Does patient wear shoes that are closed toe, low heel, adequate toe room (no pointed toe), and breathable material like leather, canvas or suede?  Yes  PAST DIABETIC HISTORY:  History of amputations:  No . Previous foot ulcer:  No.  CURRENT MEDICATIONS: Current Outpatient Medications  Medication Sig Dispense Refill   Acetaminophen (TYLENOL ARTHRITIS PAIN PO) Take by mouth.     aspirin 81 MG tablet Take 81 mg by mouth daily.     atorvastatin (LIPITOR) 40 MG tablet Take 1 tablet Daily  for Cholesterol 90 tablet 3   b complex vitamins tablet Take 1 tablet by mouth daily.     diclofenac Sodium (VOLTAREN) 1 % GEL Apply 4 g topically 4 (four) times daily. 4 g 2   dicyclomine (BENTYL) 20 MG tablet Take  1 tablet  3 x /day  if needed for Nausea, Bloating, Cramping or Diarrhea 270 tablet 1   dorzolamide-timolol (COSOPT) 22.3-6.8 MG/ML ophthalmic solution USE 1 DROP INTO THE AFFECTED EYE 2 TIMES DAILY  1   ezetimibe (ZETIA) 10 MG tablet Take 1 tablet (10 mg total) by mouth daily. 90 tablet 3   fluconazole (DIFLUCAN) 150 MG tablet Take 1 tablet 2 x /week  if needed for yeast infection 8 tablet 3   furosemide (LASIX) 20 MG tablet Take 1 tablet 2 x /day for BP & Fluid Retention / Patient knows to take by mouth 180  tablet 3   gabapentin (NEURONTIN) 800 MG tablet Take  1 capsule  3 x /day  for Chronic Pain 270 tablet 3   levothyroxine (SYNTHROID) 75 MCG tablet Take 1 to 1.5 tablet daily as directed on an empty stomach with only water for 30 minutes & No Antacid meds, Calcium or Magnesium for 4 hrs 114 tablet 3   Multiple Vitamin (MULTIVITAMIN) tablet Take 1 tablet by mouth daily.     ondansetron (ZOFRAN) 4 MG tablet Take  1 tablet  3 x /day  if needed for nausea & Vomiting 60 tablet 1   pantoprazole (PROTONIX) 40 MG tablet Take  1 tablet  Daily  to Prevent Heartburn & Indigestion 90 tablet 3   pseudoephedrine (SUDAFED) 120 MG 12 hr tablet Take   1 tablet    2 x /day (every 12 hours)    for Sinus & Chest Congestion (Patient not taking: Reported on 10/12/2021) 60 tablet 2   No current facility-administered medications for this visit.     OBJECTIVE/FOOT EXAMINATION: Skin:    normal exam; no erythema, swelling or tendernessnormal turgor Nails:  brittle, onychomycosis, thickening Sensory: decreased in the lower extremities; also has subjective symptoms of neuropathy managed with gabapentin  Other:  Pedal skin is warm and supple b/l LE. No open wounds b/l LE. No interdigital macerations noted b/l LE. Toenails 1-5 b/l elongated, discolored, dystrophic, thickened, crumbly with subungual debris and tenderness to dorsal palpation. Hyperkeratotic lesion(s) L hallux, L 2nd toe, R 2nd toe, R 3rd toe, and submet head 5 left foot.  No erythema, no edema, no drainage, no fluctuance. Preulcerative lesion noted R hallux. There is visible subdermal hemorrhage. There is no surrounding erythema, no edema, no drainage, no odor, no fluctuance.   Right Pulses: present Left Pulses: present  Bunions:  Notes progressive deformity of the bilateral great toes. Hammertoes: Severe deformity digits 1-5 b/l.  RISK CATEGORIZATION: High Risk History of foot ulcer with(out) prior amputation Loss of protective sensation Severe foot  deformity  DIAGNOSIS: 1. Pain due to onychomycosis of toenails of both feet   2. Corns and callosities   3. Pre-ulcerative calluses   4. Diabetic peripheral neuropathy associated with type 2 diabetes mellitus ([place])   5. Acquired hammertoes of both feet   6. Encounter for diabetic foot exam ([place])     PLAN: Follow-up:  -She is aware diabetic shoes are available. She would like to continue her [employer]. -Diabetic foot examination performed today. -Continue diabetic foot care principles: inspect feet daily, monitor glucose as recommended by PCP and/or Endocrinologist, and follow prescribed diet per PCP, Endocrinologist and/or dietician. -Mycotic toenails 1-5 bilaterally were debrided in length and girth with sterile nail nippers and dremel without incident. -Corn(s) L 2nd toe, R 2nd toe, and R 3rd toe and callus(es) L hallux and submet head 5 left foot were pared utilizing sterile scalpel blade without incident. Total number debrided =5. -Preulcerative lesion pared R hallux. Total number pared=1. -Patient/POA to call should there be question/concern in the interim.   in 3 months, or sooner should symptoms worsen

## 2021-10-20 ENCOUNTER — Encounter: Payer: Self-pay | Admitting: Podiatry

## 2021-12-20 ENCOUNTER — Ambulatory Visit: Payer: PPO | Admitting: Podiatry

## 2021-12-20 ENCOUNTER — Other Ambulatory Visit: Payer: Self-pay

## 2021-12-20 DIAGNOSIS — B351 Tinea unguium: Secondary | ICD-10-CM | POA: Diagnosis not present

## 2021-12-20 DIAGNOSIS — L84 Corns and callosities: Secondary | ICD-10-CM

## 2021-12-20 DIAGNOSIS — E1142 Type 2 diabetes mellitus with diabetic polyneuropathy: Secondary | ICD-10-CM | POA: Diagnosis not present

## 2021-12-20 DIAGNOSIS — M79675 Pain in left toe(s): Secondary | ICD-10-CM | POA: Diagnosis not present

## 2021-12-20 DIAGNOSIS — M79674 Pain in right toe(s): Secondary | ICD-10-CM

## 2021-12-26 ENCOUNTER — Encounter: Payer: Self-pay | Admitting: Podiatry

## 2021-12-26 NOTE — Progress Notes (Signed)
Subjective:  Patient ID: [name], female    DOB: [birth year],  MRN: [id number]  [name] presents to clinic today for at risk foot care with history of diabetic neuropathy, corn(s) bilateral 2nd digits and right 3rd digit , callus(es) left foot and painful mycotic nails.  Pain interferes with ambulation. Aggravating factors include wearing enclosed shoe gear. Painful toenails interfere with ambulation. Aggravating factors include wearing enclosed shoe gear. Pain is relieved with periodic professional debridement. Painful corns and calluses are aggravated when weightbearing with and without shoegear. Pain is relieved with periodic professional debridement.  Patient also has preulcerative lesion(s) R hallux.  New problem(s): None.   Patient has brought in a copy of insurance authorization for debridement of her corns, callus and preulcerative lesion. She is wondering why it was only approved for one visit as she needs periodic professional debridement of these areas to prevent development of wounds.  PCP is Unk Pinto, MD , and last visit was 05/06/2021.  Allergies  Allergen Reactions   Ciprofloxacin    Macrobid [Nitrofurantoin Macrocrystal]    Penicillins     REACTION: rash   Procaine Hcl     REACTION: difficulty breathing   Sulfonamide Derivatives     REACTION: hives   Tetanus Toxoid     REACTION: arm swells   Tuberculin Ppd Swelling    Review of Systems: Negative except as noted in the HPI. Objective:   Constitutional [name] is a pleasant 76 y.o. Caucasian female, morbidly obese in NAD. AAO x 3.   Vascular CFT <3 seconds b/l LE. Palpable DP pulse(s) b/l LE. Palpable PT pulse(s) b/l LE. No pain with calf compression b/l. Lower extremity skin temperature gradient within normal limits. +1 pitting edema noted BLE. Evidence of chronic venous insufficiency b/l LE. No ischemia or gangrene noted b/l LE. No cyanosis or clubbing noted b/l LE.  Neurologic Normal  speech. Oriented to person, place, and time. Pt has subjective symptoms of neuropathy. Protective sensation decreased with 10 gram monofilament b/l.  Dermatologic No open wounds b/l LE. No interdigital macerations noted b/l LE. Toenails 1-5 b/l elongated, discolored, dystrophic, thickened, crumbly with subungual debris and tenderness to dorsal palpation. Hyperkeratotic lesion(s) bilateral 2nd toes, L hallux, R 3rd toe, and submet head 5 left foot.  No erythema, no edema, no drainage, no fluctuance. Preulcerative lesion noted R hallux. There is visible subdermal hemorrhage. There is no surrounding erythema, no edema, no drainage, no odor, no fluctuance.  Orthopedic: HAV with bunion deformity noted b/l LE. Severe hammertoe deformity noted 1-5 bilaterally.   Radiographs: None  Last A1c:  Hemoglobin A1C Latest Ref Rng & Units 10/12/2021 05/06/2021 01/27/2021  HGBA1C <5.7 % of total Hgb 5.9(H) 6.0(H) 5.7(H)  Some recent data might be hidden    Assessment:   1. Pain due to onychomycosis of toenails of both feet   2. Corns and callosities   3. Pre-ulcerative calluses   4. Diabetic peripheral neuropathy associated with type 2 diabetes mellitus ([place])    Plan:  Patient was evaluated and treated and all questions answered. Will forward to our billing department in hopes of getting authorization for paring of corns, callus and preulcerative lesion for the remainder of the year. Her diabetic neuropathy, corns, callus preulcerative lesion and pedal deformities place her at high risk for limb threatening conditions if these are not periodically professionally addressed. These treatements are medically necessary to prevent wound formation. Consent given for treatment as described below: -Toenails 1-5 b/l were debrided  in length and girth with sterile nail nippers and dremel without iatrogenic bleeding.  -Corn(s) bilateral 2nd toes, L hallux, and R 3rd toe and callus(es) submet head 5 left foot were pared  utilizing sterile scalpel blade without incident. Total number debrided =5. -Preulcerative lesion pared R hallux. Total number pared=1. -Patient/POA to call should there be question/concern in the interim.  Return in about 9 weeks (around 02/21/2022).  Marzetta Board, DPM

## 2021-12-28 DIAGNOSIS — Z1231 Encounter for screening mammogram for malignant neoplasm of breast: Secondary | ICD-10-CM | POA: Diagnosis not present

## 2022-01-05 DIAGNOSIS — R928 Other abnormal and inconclusive findings on diagnostic imaging of breast: Secondary | ICD-10-CM | POA: Diagnosis not present

## 2022-01-05 DIAGNOSIS — R922 Inconclusive mammogram: Secondary | ICD-10-CM | POA: Diagnosis not present

## 2022-01-05 DIAGNOSIS — N6489 Other specified disorders of breast: Secondary | ICD-10-CM | POA: Diagnosis not present

## 2022-01-05 LAB — HM MAMMOGRAPHY

## 2022-01-06 ENCOUNTER — Encounter: Payer: Self-pay | Admitting: Internal Medicine

## 2022-01-06 DIAGNOSIS — D225 Melanocytic nevi of trunk: Secondary | ICD-10-CM | POA: Diagnosis not present

## 2022-01-06 DIAGNOSIS — L821 Other seborrheic keratosis: Secondary | ICD-10-CM | POA: Diagnosis not present

## 2022-01-06 DIAGNOSIS — L814 Other melanin hyperpigmentation: Secondary | ICD-10-CM | POA: Diagnosis not present

## 2022-01-11 DIAGNOSIS — H40013 Open angle with borderline findings, low risk, bilateral: Secondary | ICD-10-CM | POA: Diagnosis not present

## 2022-01-17 IMAGING — CT CT HEART MORP W/ CTA COR W/ SCORE W/ CA W/CM &/OR W/O CM
2 of 10 series · 8 of 20 positions shown, 10 images · non-contrast
Comparison: CTA chest dated 04/17/2018

Addendum:
CLINICAL DATA: Hx of dm, htn, hld.  Has chestpain

EXAM:
Cardiac/Coronary  CTA
TECHNIQUE: The patient was scanned on a Siemens Somatoform go.Top scanner.

[Series 22: multiphase % cta coronary 0.60 · axial · 0.43mm/px · z∈[-1421,-1343]mm · 5 of 2940 slices shown, 7 images]
[im 490/2940  vessel]
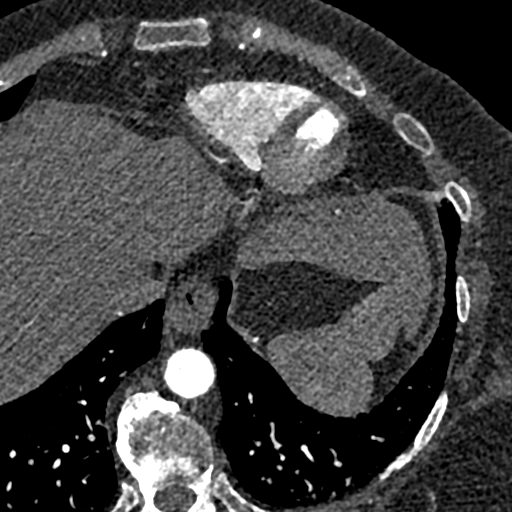
[im 490/2940  lung]
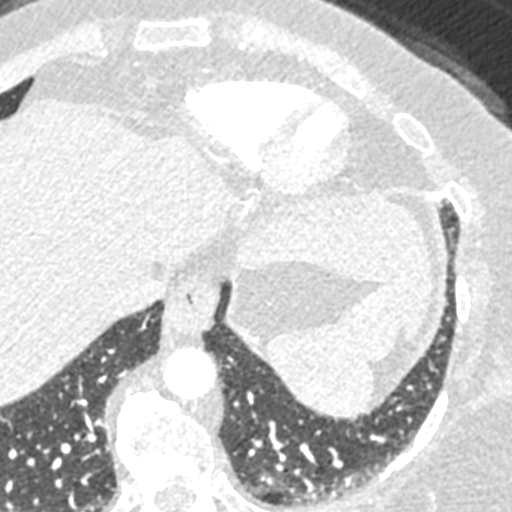
[im 980/2940  vessel]
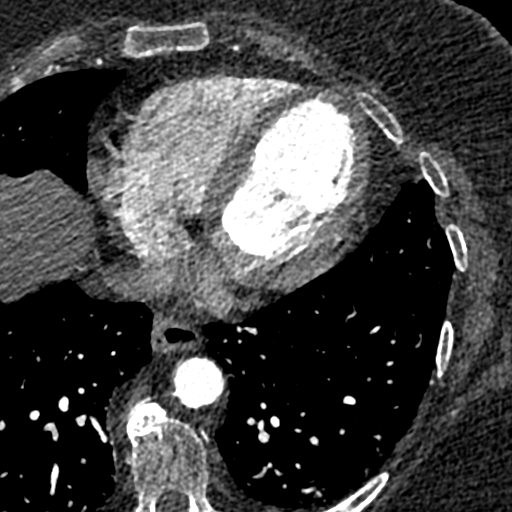
[im 1470/2940  vessel]
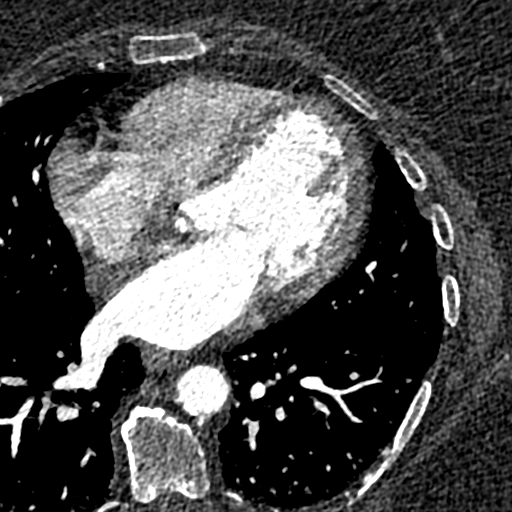
[im 1960/2940  vessel]
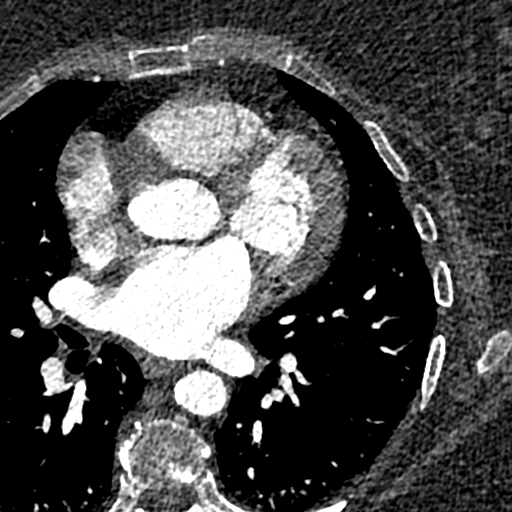
[im 2450/2940  vessel]
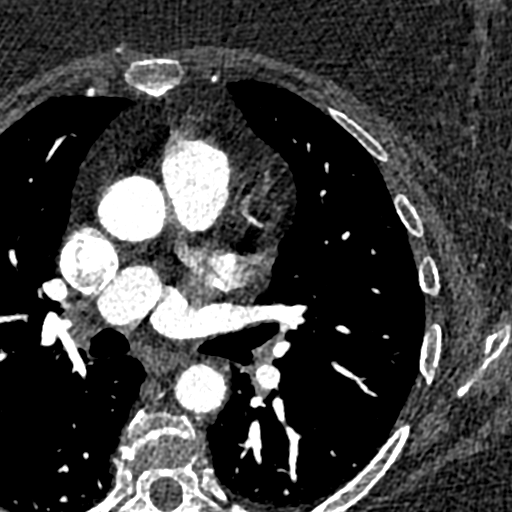
[im 2450/2940  lung]
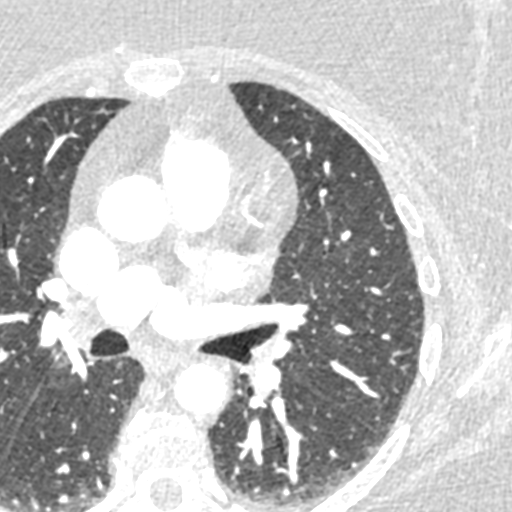

[Series 38: ms multiphase cta coronary 0.60 · axial · 0.43mm/px · z∈[-1411,-1352]mm · 3 of 2352 slices shown]
[im 588/2352  vessel]
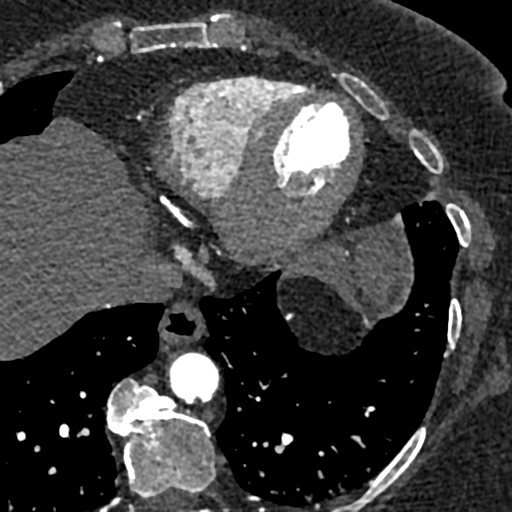
[im 1176/2352  vessel]
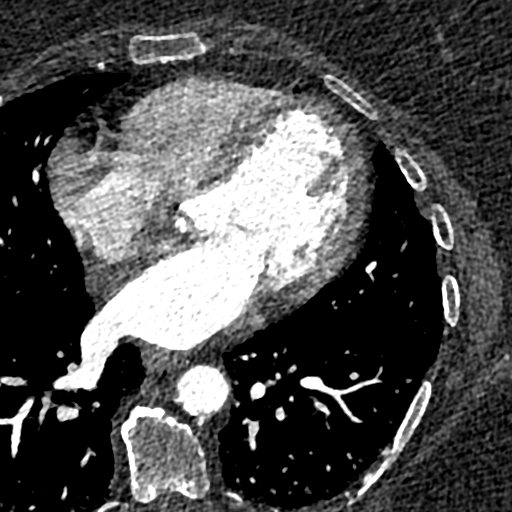
[im 1764/2352  vessel]
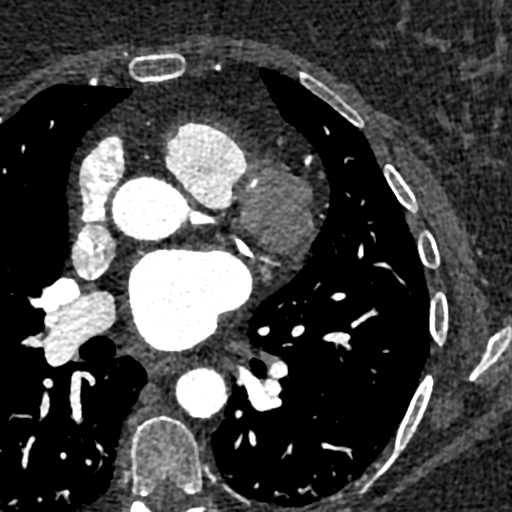

[8 of 20 positions shown; findings below may reference images not displayed]

FINDINGS: A retrospective scan was triggered in the descending thoracic aorta.
Axial non-contrast 3 mm slices were carried out through the heart.
The data set was analyzed on a dedicated work station and scored
using the Agatson method. Gantry rotation speed was 330 msecs and
collimation was .6 mm. 5mg of iv metoprolol and 0.8 mg of sl NTG was
given. The 3D data set was reconstructed in 5% intervals of the
45-95 % of the R-R cycle. Diastolic phases were analyzed on a
dedicated work station using MPR, MIP and VRT modes. The patient
received 125 cc of contrast.

Aorta: Normal size. Ascending and descending aortic wall
calcifications present. No dissection.

Aortic Valve:  Trileaflet.  No calcifications.

Coronary Arteries:  Normal coronary origin.  Right dominance.

RCA is a large dominant artery that gives rise to PDA and PLA. There
is calcified plaque in the proximal segment causing mild non
obstructive stenosis (25-49%). There is calcified plaque in the
distal RCA causing moderate (50-69%) stenosis.

Left main is a large artery that gives rise to LAD and LCX arteries.

LAD is a large vessel that has calcified plaque in its proximal
segment causing moderate (50-69%) stenosis.

LCX is a non-dominant artery that gives rise to an OM1 branch. There
is calcified plaque the the proximal LCx causing moderate (50%)
stenosis.

Other findings:

Normal pulmonary vein drainage into the left atrium.

Normal left atrial appendage without a thrombus.

Normal size of the pulmonary artery.
IMPRESSION: 1. Coronary calcium score of 693. This was 78th percentile for age
and sex matched control.

2. Normal coronary origin with right dominance.

3. Calcified plaque causing moderate stenosis in the prox LAD,
distal RCA, proximal LCx.

4.CAD-RADS 3. Moderate stenosis. Consider symptom-guided
anti-ischemic pharmacotherapy as well as risk factor modification
per guideline directed care. Additional analysis with CT FFR will be
submitted and reported separately.

EXAM:
OVER-READ INTERPRETATION  CT CHEST

The following report is an over-read performed by radiologist Dr.
over-read does not include interpretation of cardiac or coronary
anatomy or pathology. The coronary CTA interpretation by the
cardiologist is attached.
FINDINGS: 10 x 4 mm perifissural nodule in the right middle lobe along the
minor fissure (series 9/image 29), more conspicuous than on the
prior due to slice thickness, but unchanged/benign. 3 mm subpleural
nodule in the posterior right upper lobe (series 9/image 13),
unchanged/benign. 4 mm calcified granuloma in the lateral right
upper lobe (series 9/image 18), benign. No suspicious pulmonary
nodules.

Mild dependent atelectasis in the bilateral lower lobes. No
visualized pleural effusion or pneumothorax.

The heart is top-normal in size. No pericardial effusion. No
evidence of thoracic aortic aneurysm. Atherosclerotic calcifications
of the aortic arch. 3 vessel coronary atherosclerosis.

No evidence of mediastinal hematoma. Visualized soft tissues are
unremarkable. Visualized upper abdomen is grossly unremarkable.

Degenerative changes of the thoracic spine.
IMPRESSION: Benign right lung nodules, as described above, unchanged.

No significant extracardiac findings.

*** End of Addendum ***
FINDINGS: A retrospective scan was triggered in the descending thoracic aorta.
Axial non-contrast 3 mm slices were carried out through the heart.
The data set was analyzed on a dedicated work station and scored
using the Agatson method. Gantry rotation speed was 330 msecs and
collimation was .6 mm. 5mg of iv metoprolol and 0.8 mg of sl NTG was
given. The 3D data set was reconstructed in 5% intervals of the
45-95 % of the R-R cycle. Diastolic phases were analyzed on a
dedicated work station using MPR, MIP and VRT modes. The patient
received 125 cc of contrast.

Aorta: Normal size. Ascending and descending aortic wall
calcifications present. No dissection.

Aortic Valve:  Trileaflet.  No calcifications.

Coronary Arteries:  Normal coronary origin.  Right dominance.

RCA is a large dominant artery that gives rise to PDA and PLA. There
is calcified plaque in the proximal segment causing mild non
obstructive stenosis (25-49%). There is calcified plaque in the
distal RCA causing moderate (50-69%) stenosis.

Left main is a large artery that gives rise to LAD and LCX arteries.

LAD is a large vessel that has calcified plaque in its proximal
segment causing moderate (50-69%) stenosis.

LCX is a non-dominant artery that gives rise to an OM1 branch. There
is calcified plaque the the proximal LCx causing moderate (50%)
stenosis.

Other findings:

Normal pulmonary vein drainage into the left atrium.

Normal left atrial appendage without a thrombus.

Normal size of the pulmonary artery.
IMPRESSION: 1. Coronary calcium score of 693. This was 78th percentile for age
and sex matched control.

2. Normal coronary origin with right dominance.

3. Calcified plaque causing moderate stenosis in the prox LAD,
distal RCA, proximal LCx.

4.CAD-RADS 3. Moderate stenosis. Consider symptom-guided
anti-ischemic pharmacotherapy as well as risk factor modification
per guideline directed care. Additional analysis with CT FFR will be
submitted and reported separately.

## 2022-01-24 ENCOUNTER — Encounter: Payer: Self-pay | Admitting: Internal Medicine

## 2022-01-24 DIAGNOSIS — C50411 Malignant neoplasm of upper-outer quadrant of right female breast: Secondary | ICD-10-CM | POA: Diagnosis not present

## 2022-01-24 DIAGNOSIS — C50611 Malignant neoplasm of axillary tail of right female breast: Secondary | ICD-10-CM | POA: Diagnosis not present

## 2022-01-26 NOTE — Progress Notes (Signed)
MEDICARE ANNUAL WELLNESS    Assessment and Plan:   Encounter for Medicare annual wellness Yearly  Breast Cancer Recent diagnosis, has appt 3/15 with oncology  Controlled type 2 diabetes mellitus with complication, without long-term current use of insulin ([place]) Discussed general issues about diabetes pathophysiology and management., Educational material distributed., Suggested low cholesterol diet., Encouraged aerobic exercise., Discussed foot care., Reminded to get yearly retinal exam. -     Hemoglobin A1c ([facility])  Hyperlipidemia associated with T2DM (HCC) check lipids, statin/zetia for LDL goal <70 decrease fatty foods increase activity.  -    Lipid panel   CKD stage 3 secondary to diabetes (HCC) Increase fluids, avoid NSAIDS, monitor sugars, will monitor -    CMP/GFR -    UA/microalbumin  Atherosclerosis of aorta (HCC) Per CT 03/23/17 Control blood pressure, cholesterol, glucose, increase exercise.   CAD Cardiology following; defer EKG to them Control blood pressure, cholesterol, glucose, increase exercise.  Go to the ER if any chest pain, shortness of breath, nausea, dizziness.  Morbid obesity ([place]) - follow up 3 months for progress monitoring - increase veggies, decrease carbs - long discussion about weight loss, diet, and exercise  Labile hypertension - continue medications, DASH diet, exercise and monitor at home. Call if greater than 130/80.  -     CBC with Diff -     COMPLETE METABOLIC PANEL WITH GFR -     Urinalysis, Routine w reflex microscopic -     Microalbumin / Creatinine Urine Ratio  Hypothyroidism, unspecified type -     TSH Hypothyroidism-check TSH level, continue medications the same, reminded to take on an empty stomach 30-31mns before food.   Medication management Continued  Vitamin D deficiency -     Vitamin D (25 hydroxy)  History of tobacco use -23 pack year Monitor, had benign CT 12/2019  Chronic venous insufficiency - weight loss  discussed, continue compression stockings and elevation  History of basal cell carcinoma (BCC) Follow up derm  Gastroesophageal reflux disease with esophagitis without hemorrhage Continue PPI/H2 blocker, diet discussed  Diverticulosis of large intestine without hemorrhage Bowel management, soluble fiber, monitor   Peripheral autonomic neuropathy of unknown cause Monitor- follows with Dr. GAdah Perl Primary osteoarthritis of knee, unspecified laterality Ortho following, monitoring at this time     Continue diet and meds as discussed. Further disposition pending results of labs. Over 30 minutes of face to face interview, exam, counseling, chart review, and critical decision making was performed  Future Appointments  Date Time Provider DObion 02/02/2022  2:00 PM [facility]ie [facility]  02/21/2022  1:15 PM GMarzetta Board DPM [facility]  04/25/2022  1:30 PM GMarzetta Board DPM [facility]  01/30/2023  4:00 PM Moranda Billiot, DTownsend Roger NP [facility] None    HPI 76y.o. female  presents for Medicare annual wellness and 3 month follow up. She has Diverticulosis of large intestine; History of tobacco use; Hypothyroidism; Controlled type 2 diabetes mellitus with complication, without long-term current use of insulin ([place]; Morbid obesity ([place]; Chronic venous insufficiency; Peripheral autonomic neuropathy of unknown cause; Labile hypertension; History of basal cell carcinoma (BCC); Osteoarthritis of knee; Atherosclerosis of aorta ([place]; Vitamin D deficiency; Medication management; Gastroesophageal reflux disease with esophagitis; Type 2 diabetes mellitus with hyperlipidemia ([place]; CKD stage 3 secondary to diabetes ([place]; Family history of heart disease; and CAD (coronary artery disease) on their problem list.  She is widowed, 3 grandchildren, 1 great grandson - 130 months  She is retired  from banking -   She found out 2 days ago that she has breast cancer,  has upcoming appointment with Dr. Lindi Adie. Had biopsy which revealed breast Cancer. Most likely will be lumpectomy only.   She is former smoker - 23.5 pack year hisotry, quit 2015, strong family hx of llung cancer, she had CT chest for coronary in 12/26/2019 which showed benign appearing nodules.   BMI is Body mass index is 34.49 kg/m., she is working on diet and exercise, trying to watch what she eats and smaller portions. Down from 246 lb in 07/2018.  Wt Readings from Last 3 Encounters:  01/27/22 220 lb 3.2 oz (99.9 kg)  10/12/21 222 lb 3.2 oz (100.8 kg)  06/30/21 221 lb 12.8 oz (100.6 kg)   She is following with Dr. Andree Elk for CAD; in 01/2020 she had cardiac CT 12/27/19  showing CCS of 693, 78th % for age/gender, Calcified plaque causing moderate stenosis in the prox LAD, distal RCA, proximal LCx, moderate stenosis, and also aortic atherosclerosis   Her blood pressure has been controlled at home, has been good, today their BP is BP: 114/62  BP Readings from Last 3 Encounters:  01/27/22 114/62  10/12/21 110/62  06/30/21 (!) 104/58     She does workout. She denies chest pain, shortness of breath, dizziness.  She has been working on diet and exercise for Diabetes, doing lifestyle changes  with diabetic chronic kidney disease she is on ACE/ARB With hyperlipidemia IS at goal less than 70, she taking atorvastatin 40 mg and ezetimibe 10 mg  she is on bASA Has idiopathic paraesthsias and is on gabapentin- follows with Dr. Adah Perl every 90 days, did have infection/ulcer of left second toe in the past, no recent issues, checks feet daily  Eye exam Dr. Nicki Reaper - saw recently, report requested  denies polydipsia, polyuria and visual disturbances.   Last A1C was:  Lab Results  Component Value Date   HGBA1C 5.9 (H) 10/12/2021   Lab Results  Component Value Date   GFRNONAA 62 05/06/2021   Lab Results  Component Value Date   CHOL 129 10/12/2021   HDL 42 (L) 10/12/2021   LDLCALC 62  10/12/2021   TRIG 169 (H) 10/12/2021   CHOLHDL 3.1 10/12/2021   Patient is on Vitamin D supplement.   Lab Results  Component Value Date   VD25OH 77 10/12/2021     She is on thyroid medication. Her medication was not changed last visit. 75 mcg 1.5 tabs 4 days a week, 1 tab 3 days a week, takes on empty stomach with water only.   Lab Results  Component Value Date   TSH 0.45 10/12/2021  .     Current Medications:   Current Outpatient Medications (Endocrine & Metabolic):    levothyroxine (SYNTHROID) 75 MCG tablet, Take 1 to 1.5 tablet daily as directed on an empty stomach with only water for 30 minutes & No Antacid meds, Calcium or Magnesium for 4 hrs  Current Outpatient Medications (Cardiovascular):    atorvastatin (LIPITOR) 40 MG tablet, Take 1 tablet Daily  for Cholesterol   furosemide (LASIX) 20 MG tablet, Take 1 tablet 2 x /day for BP & Fluid Retention / Patient knows to take by mouth   ezetimibe (ZETIA) 10 MG tablet, Take 1 tablet (10 mg total) by mouth daily.  Current Outpatient Medications (Respiratory):    pseudoephedrine (SUDAFED) 120 MG 12 hr tablet, Take   1 tablet    2 x /day (every 12 hours)  for Sinus & Chest Congestion (Patient not taking: Reported on 10/12/2021)  Current Outpatient Medications (Analgesics):    Acetaminophen (TYLENOL ARTHRITIS PAIN PO), Take by mouth.   aspirin 81 MG tablet, Take 81 mg by mouth daily.   Current Outpatient Medications (Other):    b complex vitamins tablet, Take 1 tablet by mouth daily.   CINNAMON PO, Take by mouth. '400mg'$    diclofenac Sodium (VOLTAREN) 1 % GEL, Apply 4 g topically 4 (four) times daily.   dicyclomine (BENTYL) 20 MG tablet, Take  1 tablet  3 x /day  if needed for Nausea, Bloating, Cramping or Diarrhea   dorzolamide-timolol (COSOPT) 22.3-6.8 MG/ML ophthalmic solution, USE 1 DROP INTO THE AFFECTED EYE 2 TIMES DAILY   gabapentin (NEURONTIN) 800 MG tablet, Take  1 capsule  3 x /day  for Chronic Pain   Multiple Vitamin  (MULTIVITAMIN) tablet, Take 1 tablet by mouth daily.   ondansetron (ZOFRAN) 4 MG tablet, Take  1 tablet  3 x /day  if needed for nausea & Vomiting   pantoprazole (PROTONIX) 40 MG tablet, Take  1 tablet  Daily  to Prevent Heartburn & Indigestion   Probiotic Product (PROBIOTIC DAILY PO), Take by mouth.   fluconazole (DIFLUCAN) 150 MG tablet, Take 1 tablet 2 x /week  if needed for yeast infection (Patient not taking: Reported on 01/27/2022)  Medical History:  Past Medical History:  Diagnosis Date   Anemia    Chest tightness    CKD stage 3 secondary to diabetes ([place]) 10/09/2019   Fatigue    GERD (gastroesophageal reflux disease)    Heart palpitations 12/06/2019   Hyperlipidemia    Hypothyroid    Internal hemorrhoids 05/04/2009   Qualifier: Diagnosis of  By: Nelson-Smith CMA (AAMA), Dottie     Myalgia    Obesity    Prediabetes    Ulcer    Varicose veins    Health Maintenance:   Immunization History  Administered Date(s) Administered   Influenza, High Dose Seasonal PF 08/19/2014, 09/10/2015, 07/26/2016, 08/10/2017, 08/14/2018, 08/27/2019, 09/10/2020, 10/12/2021   Pneumococcal Conjugate-13 05/12/2014   Pneumococcal Polysaccharide-23 09/05/2007, 06/02/2015   Preventative care: Last colonoscopy: 2011, Past Due 2021,   Last mammogram: 01/05/22 Last pap smear/pelvic exam: 02/2011- declines another DEXA: 07/2012, normal  Heart cath June 1999, no blockages, coronary spasms  Prior vaccinations: TD or Tdap: ALLERGY- just swollen arm  Influenza: 09/2021 Pneumococcal: 2016 Prevnar13: 2015  Shingrix 45 dollars a shot, discussed. Covid 19: plans to get through [facility]  Personal history of 06/2020  Derm [facility], hx of melanoma to R shin, goes annually, last 2021 Dr. Nicki Reaper for eye care, checking for glaucoma, 01/11/22 Dr. Johney Maine Dentist  Patient Care Team: Unk Pinto, MD as PCP - General (Internal Medicine) Buford Dresser, MD as PCP - Cardiology  (Cardiology) Macarthur Critchley, [place] as Referring Physician (Optometry) Delfina Redwood as Referring Physician (Physician Assistant)  Allergies Allergies  Allergen Reactions   Ciprofloxacin    Macrobid [Nitrofurantoin Macrocrystal]    Penicillins     REACTION: rash   Procaine Hcl     REACTION: difficulty breathing   Sulfonamide Derivatives     REACTION: hives   Tetanus Toxoid     REACTION: arm swells   Tuberculin Ppd Swelling    SURGICAL HISTORY She  has a past surgical history that includes Abdominal hysterectomy; Tonsillectomy and adenoidectomy; Cataract extraction (Bilateral); Nasal sinus surgery; Rotator cuff repair (Left, more than 7 years ago); and Cyst removal hand (Left,  07/30/2018). FAMILY HISTORY Her family history includes Coronary artery disease (age of onset: 47) in her father; Dementia in her maternal aunt and mother; Lung cancer in her maternal grandmother and mother. SOCIAL HISTORY She  reports that she quit smoking about 8 years ago. Her smoking use included cigarettes. She started smoking about 56 years ago. She has a 23.50 pack-year smoking history. She has never used smokeless tobacco. She reports that she does not currently use alcohol. She reports that she does not use drugs.  MEDICARE WELLNESS OBJECTIVES: Physical activity: Current Exercise Habits: Home exercise routine, Time (Minutes): 20, Frequency (Times/Week): 3, Weekly Exercise (Minutes/Week): 60, Intensity: Mild, Exercise limited by: neurologic condition(s);orthopedic condition(s) Cardiac risk factors: Cardiac Risk Factors include: advanced age (>49mn, >>80women);diabetes mellitus;dyslipidemia;hypertension;sedentary lifestyle;obesity (BMI >30kg/m2);smoking/ tobacco exposure Depression/mood screen:   [facility] 01/27/20[facility]  Decreased Interest 0  Down, Depressed, Hopeless 1  PHQ - 2 Score 1  Some recent data might be hidden    ADLs:  In your present state of health, do you have any  difficulty performing the following activities: 01/27/2022 05/08/2021  Hearing? N N  Vision? N N  Difficulty concentrating or making decisions? N N  Walking or climbing stairs? N N  Dressing or bathing? N N  Doing errands, shopping? N N  Preparing Food and eating ? - -  Using the Toilet? - -  In the past six months, have you accidently leaked urine? - -  Do you have problems with loss of bowel control? - -  Managing your Medications? - -  Managing your Finances? - -  Housekeeping or managing your Housekeeping? - -  Some recent data might be hidden     Cognitive Testing  Alert? Yes  Normal Appearance?Yes  Oriented to person? Yes  Place? Yes   Time? Yes  Recall of three objects?  Yes  Can perform simple calculations? Yes  Displays appropriate judgment?Yes  Can read the correct time from a watch face?Yes  EOL planning: Does Patient Have a Medical Advance Directive?: Yes Type of Advance Directive: Healthcare Power of Attorney, Living will Does patient want to make changes to medical advance directive?: No - Patient declined Copy of [place] Chart?: No - copy requested    Review of Systems:  Review of Systems  Constitutional:  Negative for malaise/fatigue and weight loss.  HENT:  Negative for hearing loss and tinnitus.   Eyes:  Negative for blurred vision and double vision.  Respiratory:  Negative for cough, sputum production, shortness of breath and wheezing.   Cardiovascular:  Negative for chest pain, palpitations, orthopnea, claudication, leg swelling and PND.  Gastrointestinal:  Negative for abdominal pain, blood in stool, constipation, diarrhea, heartburn, melena, nausea and vomiting.  Genitourinary: Negative.   Musculoskeletal:  Positive for joint pain (knees). Negative for falls and myalgias.  Skin:  Negative for rash.  Neurological:  Positive for tingling (bil feet). Negative for dizziness, sensory change, weakness and headaches.   Endo/Heme/Allergies:  Negative for polydipsia.  Psychiatric/Behavioral: Negative.  Negative for depression, memory loss, substance abuse and suicidal ideas. The patient is not nervous/anxious and does not have insomnia.   All other systems reviewed and are negative.  Physical Exam: BP 114/62    Pulse 66    Temp (!) 97.5 F (36.4 C)    Wt 220 lb 3.2 oz (99.9 kg)    SpO2 97%    BMI 34.49 kg/m  Wt Readings from Last 3 Encounters:  01/27/22  220 lb 3.2 oz (99.9 kg)  10/12/21 222 lb 3.2 oz (100.8 kg)  06/30/21 221 lb 12.8 oz (100.6 kg)   General Appearance: Well nourished, in no apparent distress. Eyes: PERRLA, EOMs, conjunctiva no swelling or erythema Sinuses: No Frontal/maxillary tenderness ENT/Mouth: Ext aud canals clear, TMs without erythema, bulging. No erythema, swelling, or exudate on post pharynx.  Tonsils not swollen or erythematous. Hearing normal.  Neck: Supple, thyroid normal.  Respiratory: Respiratory effort normal, BS equal bilaterally without rales, rhonchi, wheezing or stridor.  Cardio: RRR with no MRGs. Brisk peripheral pulses with mild edema bil lower legs  Abdomen: Soft, + BS,  Non tender, no guarding, rebound, hernias, masses. Lymphatics: Non tender without lymphadenopathy.  Musculoskeletal: Full ROM, 5/5 strength, normal steady gait Skin:  Warm, dry without rashes, lesions, ecchymosis.  Neuro: Cranial nerves intact. Normal muscle tone, no cerebellar symptoms.Bil feet with diminished sensation throughout to monofilament Psych: Awake and oriented X 3, normal affect, Insight and Judgment appropriate.  Breasts: declines today GU: no concerns, defer    Brantley Wiley Kathyrn Drown, NP 3:30PM [facility]

## 2022-01-27 ENCOUNTER — Ambulatory Visit (INDEPENDENT_AMBULATORY_CARE_PROVIDER_SITE_OTHER): Payer: PPO | Admitting: Nurse Practitioner

## 2022-01-27 ENCOUNTER — Telehealth: Payer: Self-pay | Admitting: Hematology and Oncology

## 2022-01-27 ENCOUNTER — Encounter: Payer: Self-pay | Admitting: Nurse Practitioner

## 2022-01-27 ENCOUNTER — Other Ambulatory Visit: Payer: Self-pay

## 2022-01-27 VITALS — BP 114/62 | HR 66 | Temp 97.5°F | Wt 220.2 lb

## 2022-01-27 DIAGNOSIS — I872 Venous insufficiency (chronic) (peripheral): Secondary | ICD-10-CM | POA: Diagnosis not present

## 2022-01-27 DIAGNOSIS — C50411 Malignant neoplasm of upper-outer quadrant of right female breast: Secondary | ICD-10-CM

## 2022-01-27 DIAGNOSIS — Z79899 Other long term (current) drug therapy: Secondary | ICD-10-CM | POA: Diagnosis not present

## 2022-01-27 DIAGNOSIS — R6889 Other general symptoms and signs: Secondary | ICD-10-CM | POA: Diagnosis not present

## 2022-01-27 DIAGNOSIS — I10 Essential (primary) hypertension: Secondary | ICD-10-CM

## 2022-01-27 DIAGNOSIS — I700 Atherosclerosis of aorta: Secondary | ICD-10-CM

## 2022-01-27 DIAGNOSIS — K2100 Gastro-esophageal reflux disease with esophagitis, without bleeding: Secondary | ICD-10-CM | POA: Diagnosis not present

## 2022-01-27 DIAGNOSIS — E118 Type 2 diabetes mellitus with unspecified complications: Secondary | ICD-10-CM | POA: Diagnosis not present

## 2022-01-27 DIAGNOSIS — E1122 Type 2 diabetes mellitus with diabetic chronic kidney disease: Secondary | ICD-10-CM

## 2022-01-27 DIAGNOSIS — Z0000 Encounter for general adult medical examination without abnormal findings: Secondary | ICD-10-CM

## 2022-01-27 DIAGNOSIS — N183 Chronic kidney disease, stage 3 unspecified: Secondary | ICD-10-CM

## 2022-01-27 DIAGNOSIS — G9009 Other idiopathic peripheral autonomic neuropathy: Secondary | ICD-10-CM

## 2022-01-27 DIAGNOSIS — E1169 Type 2 diabetes mellitus with other specified complication: Secondary | ICD-10-CM

## 2022-01-27 DIAGNOSIS — Z85828 Personal history of other malignant neoplasm of skin: Secondary | ICD-10-CM | POA: Diagnosis not present

## 2022-01-27 DIAGNOSIS — Z0001 Encounter for general adult medical examination with abnormal findings: Secondary | ICD-10-CM | POA: Diagnosis not present

## 2022-01-27 DIAGNOSIS — E559 Vitamin D deficiency, unspecified: Secondary | ICD-10-CM

## 2022-01-27 DIAGNOSIS — E039 Hypothyroidism, unspecified: Secondary | ICD-10-CM | POA: Diagnosis not present

## 2022-01-27 DIAGNOSIS — I2510 Atherosclerotic heart disease of native coronary artery without angina pectoris: Secondary | ICD-10-CM

## 2022-01-27 DIAGNOSIS — K5730 Diverticulosis of large intestine without perforation or abscess without bleeding: Secondary | ICD-10-CM

## 2022-01-27 DIAGNOSIS — E785 Hyperlipidemia, unspecified: Secondary | ICD-10-CM | POA: Diagnosis not present

## 2022-01-27 DIAGNOSIS — M1710 Unilateral primary osteoarthritis, unspecified knee: Secondary | ICD-10-CM

## 2022-01-27 NOTE — Telephone Encounter (Signed)
Spoke to patient to confirm afternoon clinic appointment for 3/15, [name] will send packet ?

## 2022-01-28 LAB — COMPLETE METABOLIC PANEL WITH GFR
AG Ratio: 1.7 (calc) (ref 1.0–2.5)
ALT: 15 U/L (ref 6–29)
AST: 16 U/L (ref 10–35)
Albumin: 4.1 g/dL (ref 3.6–5.1)
Alkaline phosphatase (APISO): 94 U/L (ref 37–153)
BUN: 16 mg/dL (ref 7–25)
CO2: 27 mmol/L (ref 20–32)
Calcium: 9.5 mg/dL (ref 8.6–10.4)
Chloride: 106 mmol/L (ref 98–110)
Creat: 0.99 mg/dL (ref 0.60–1.00)
Globulin: 2.4 g/dL (calc) (ref 1.9–3.7)
Glucose, Bld: 80 mg/dL (ref 65–99)
Potassium: 4.4 mmol/L (ref 3.5–5.3)
Sodium: 144 mmol/L (ref 135–146)
Total Bilirubin: 0.7 mg/dL (ref 0.2–1.2)
Total Protein: 6.5 g/dL (ref 6.1–8.1)
eGFR: 60 mL/min/{1.73_m2} (ref >=60)

## 2022-01-28 LAB — CBC WITH DIFFERENTIAL/PLATELET
Absolute Monocytes: 806 cells/uL (ref 200–950)
Basophils Absolute: 53 cells/uL (ref 0–200)
Basophils Relative: 0.5 %
Eosinophils Absolute: 159 cells/uL (ref 15–500)
Eosinophils Relative: 1.5 %
HCT: 42.5 % (ref 35.0–45.0)
Hemoglobin: 13.8 g/dL (ref 11.7–15.5)
Lymphs Abs: 3286 cells/uL (ref 850–3900)
MCH: 28.8 pg (ref 27.0–33.0)
MCHC: 32.5 g/dL (ref 32.0–36.0)
MCV: 88.7 fL (ref 80.0–100.0)
MPV: 11.1 fL (ref 7.5–12.5)
Monocytes Relative: 7.6 %
Neutro Abs: 6296 cells/uL (ref 1500–7800)
Neutrophils Relative %: 59.4 %
Platelets: 256 10*3/uL (ref 140–400)
RBC: 4.79 10*6/uL (ref 3.80–5.10)
RDW: 13 % (ref 11.0–15.0)
Total Lymphocyte: 31 %
WBC: 10.6 10*3/uL (ref 3.8–10.8)

## 2022-01-28 LAB — TSH: TSH: 0.85 mIU/L (ref 0.40–4.50)

## 2022-01-28 LAB — HEMOGLOBIN A1C
Hgb A1c MFr Bld: 6 % of total Hgb — ABNORMAL HIGH (ref <5.7)
Mean Plasma Glucose: 126 mg/dL
eAG (mmol/L): 7 mmol/L

## 2022-01-28 LAB — LIPID PANEL
Cholesterol: 122 mg/dL (ref <200)
HDL: 43 mg/dL — ABNORMAL LOW (ref >=50)
LDL Cholesterol (Calc): 52 mg/dL (calc)
Non-HDL Cholesterol (Calc): 79 mg/dL (calc) (ref <130)
Total CHOL/HDL Ratio: 2.8 (calc) (ref <5.0)
Triglycerides: 196 mg/dL — ABNORMAL HIGH (ref <150)

## 2022-01-31 ENCOUNTER — Encounter: Payer: Self-pay | Admitting: Internal Medicine

## 2022-01-31 ENCOUNTER — Other Ambulatory Visit: Payer: Self-pay | Admitting: Internal Medicine

## 2022-01-31 ENCOUNTER — Encounter: Payer: Self-pay | Admitting: *Deleted

## 2022-01-31 DIAGNOSIS — E782 Mixed hyperlipidemia: Secondary | ICD-10-CM

## 2022-01-31 MED ORDER — EZETIMIBE 10 MG PO TABS
10.0000 mg | ORAL_TABLET | Freq: Every day | ORAL | 0 refills | Status: DC
  Filled 2022-11-03 – 2023-01-12 (×3): qty 90, 90d supply, fill #0

## 2022-01-31 NOTE — Progress Notes (Unsigned)
[place] NOTE  Patient Care Team: Unk Pinto, MD as PCP - General (Internal Medicine) Buford Dresser, MD as PCP - Cardiology (Cardiology) Macarthur Critchley, [place] as Referring Physician (Optometry) Delfina Redwood as Referring Physician (Physician Assistant) Mauro Kaufmann, RN as Oncology Nurse Navigator Rockwell Germany, RN as Oncology Nurse Navigator [name], MD as Consulting Physician (General Surgery) Nicholas Lose, MD as Consulting Physician (Hematology and Oncology) Eppie Gibson, MD as Attending Physician (Radiation Oncology)  CHIEF COMPLAINTS/PURPOSE OF CONSULTATION:  Newly diagnosed breast cancer  HISTORY OF PRESENTING ILLNESS:  [name] 76 y.o. female is here because of recent diagnosis of {left/right:[id number]}   I reviewed her records extensively and collaborated the history with the patient.  SUMMARY OF ONCOLOGIC HISTORY: Oncology History   No history exists.     MEDICAL HISTORY:  Past Medical History:  Diagnosis Date   Anemia    Chest tightness    CKD stage 3 secondary to diabetes ([place]) 10/09/2019   Fatigue    GERD (gastroesophageal reflux disease)    Heart palpitations 12/06/2019   Hyperlipidemia    Hypothyroid    Internal hemorrhoids 05/04/2009   Qualifier: Diagnosis of  By: Nelson-Smith CMA (AAMA), Dottie     Myalgia    Obesity    Prediabetes    Ulcer    Varicose veins     SURGICAL HISTORY: Past Surgical History:  Procedure Laterality Date   ABDOMINAL HYSTERECTOMY     Total    CATARACT EXTRACTION Bilateral    Dr. Tommy Rainwater   CYST REMOVAL HAND Left 07/30/2018   NASAL SINUS SURGERY     ROTATOR CUFF REPAIR Left more than 7 years ago   Dr. Percell Miller and Dr. 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  Widow.     Social Determinants of Health   Financial Resource Strain: Not on file  Food Insecurity: Not on file  Transportation Needs: Not on file  Physical Activity: Not on file  Stress: Not on file  Social Connections: Not on file  Intimate Partner Violence: Not on file    FAMILY HISTORY: Family History  Problem Relation Age of Onset   Coronary artery disease Father 71   Dementia Mother    Lung cancer Mother        smoker   Dementia Maternal Aunt    Lung cancer Maternal Grandmother        lung, non-smoker    ALLERGIES:  is allergic to ciprofloxacin, macrobid [nitrofurantoin macrocrystal], penicillins, procaine hcl, sulfonamide derivatives, tetanus  toxoid, and tuberculin ppd.  MEDICATIONS:  Current Outpatient Medications  Medication Sig Dispense Refill   Acetaminophen (TYLENOL ARTHRITIS PAIN PO) Take by mouth.     aspirin 81 MG tablet Take 81 mg by mouth daily.     atorvastatin (LIPITOR) 40 MG tablet Take 1 tablet Daily  for Cholesterol 90 tablet 3   b complex vitamins tablet Take 1 tablet by mouth daily.     CINNAMON PO Take by mouth. '400mg'$      diclofenac Sodium (VOLTAREN) 1 % GEL Apply 4 g topically 4 (four) times daily. 4 g 2   dicyclomine (BENTYL) 20 MG tablet Take  1 tablet  3 x /day  if needed for Nausea, Bloating, Cramping or Diarrhea 270 tablet 1   dorzolamide-timolol (COSOPT) 22.3-6.8 MG/ML ophthalmic solution USE 1 DROP  INTO THE AFFECTED EYE 2 TIMES DAILY  1   ezetimibe (ZETIA) 10 MG tablet Take 1 tablet (10 mg total) by mouth daily. 90 tablet 3   fluconazole (DIFLUCAN) 150 MG tablet Take 1 tablet 2 x /week  if needed for yeast infection (Patient not taking: Reported on 01/27/2022) 8 tablet 3   furosemide (LASIX) 20 MG tablet Take 1 tablet 2 x /day for BP & Fluid Retention / Patient knows to take by mouth 180 tablet 3   gabapentin (NEURONTIN) 800 MG tablet Take  1 capsule  3 x /day  for Chronic Pain 270 tablet 3   levothyroxine (SYNTHROID) 75 MCG tablet Take 1 to 1.5 tablet daily as directed on an empty stomach with only water for 30 minutes & No Antacid meds, Calcium or Magnesium for 4 hrs 114 tablet 3   Multiple Vitamin (MULTIVITAMIN) tablet Take 1 tablet by mouth daily.     ondansetron (ZOFRAN) 4 MG tablet Take  1 tablet  3 x /day  if needed for nausea & Vomiting 60 tablet 1   pantoprazole (PROTONIX) 40 MG tablet Take  1 tablet  Daily  to Prevent Heartburn & Indigestion 90 tablet 3   Probiotic Product (PROBIOTIC DAILY PO) Take by mouth.     pseudoephedrine (SUDAFED) 120 MG 12 hr tablet Take   1 tablet    2 x /day (every 12 hours)    for Sinus & Chest Congestion (Patient not taking: Reported on 10/12/2021) 60 tablet 2   No current facility-administered medications for this visit.    REVIEW OF SYSTEMS:   Constitutional: Denies fevers, chills or abnormal night sweats Eyes: Denies blurriness of vision, double vision or watery eyes Ears, nose, mouth, throat, and face: Denies mucositis or sore throat Respiratory: Denies cough, dyspnea or wheezes Cardiovascular: Denies palpitation, chest discomfort or lower extremity swelling Gastrointestinal:  Denies nausea, heartburn or change in bowel habits Skin: Denies abnormal skin rashes Lymphatics: Denies new lymphadenopathy or easy bruising Neurological:Denies numbness, tingling or new weaknesses Behavioral/Psych: Mood is stable, no new changes  Breast: *** Denies any  palpable lumps or discharge All other systems were reviewed with the patient and are negative.  PHYSICAL EXAMINATION: ECOG PERFORMANCE STATUS: {CHL ONC ECOG PS:[id number]}  There were no vitals filed for this visit. There were no vitals filed for this visit.  GENERAL:alert, no distress and comfortable SKIN: skin color, texture, turgor are normal, no rashes or significant lesions EYES: normal, conjunctiva are pink and non-injected, sclera clear OROPHARYNX:no exudate, no erythema and lips, buccal mucosa, and tongue normal  NECK: supple, thyroid normal size, non-tender, without nodularity LYMPH:  no palpable lymphadenopathy in the cervical, axillary or inguinal LUNGS:  clear to auscultation and percussion with normal breathing effort HEART: regular rate & rhythm and no murmurs and no lower extremity edema ABDOMEN:abdomen soft, non-tender and normal bowel sounds Musculoskeletal:no cyanosis of digits and no clubbing  PSYCH: alert & oriented x 3 with fluent speech NEURO: no focal motor/sensory deficits BREAST:*** No palpable nodules in breast. No palpable axillary or supraclavicular lymphadenopathy (exam performed in the presence of a chaperone)   LABORATORY DATA:  I have reviewed the data as listed Lab Results  Component Value Date   WBC 10.6 01/27/2022   HGB 13.8 01/27/2022   HCT 42.5 01/27/2022   MCV 88.7 01/27/2022   PLT 256 01/27/2022   Lab Results  Component Value Date   NA 144 01/27/2022   K 4.4 01/27/2022   CL 106 01/27/2022   CO2 27 01/27/2022    RADIOGRAPHIC STUDIES: I have personally reviewed the radiological reports and agreed with the findings in the report.  ASSESSMENT AND PLAN:  No problem-specific Assessment & Plan notes found for this encounter.   All questions were answered. The patient knows to call the clinic with any problems, questions or concerns.    [place], CMA 01/31/22  I, Gardiner Coins, am acting as a Education administrator for Dr. Lindi Adie

## 2022-02-01 ENCOUNTER — Encounter: Payer: Self-pay | Admitting: *Deleted

## 2022-02-01 DIAGNOSIS — C50411 Malignant neoplasm of upper-outer quadrant of right female breast: Secondary | ICD-10-CM

## 2022-02-01 NOTE — Progress Notes (Signed)
Radiation Oncology         [phone] ________________________________  Initial Outpatient Consultation  Name: [name] MRN: [id number]  Date: 02/02/2022  DOB: [birth year]  CC:Lucky Cowboy, MD  Abigail Miyamoto, MD   REFERRING PHYSICIAN: Abigail Miyamoto, MD  DIAGNOSIS:    ICD-10-CM   1. Malignant neoplasm of upper-outer quadrant of right breast in female, estrogen receptor positive (HCC)  C50.411    Z17.0      Stage IA (cT1b, cN0, cM0) Right Breast UOQ Invasive Ductal Carcinoma and intermediate grade DCIS, ER+ / PR+ / Her2-, Grade 2  CHIEF COMPLAINT: Here to discuss management of right breast cancer  HISTORY OF PRESENT ILLNESS::[name] is a 76 y.o. female who presented with a right breast abnormality on the following imaging: bilateral screening mammogram on the date of 01/17/22.  No symptoms, if any, were reported at that time. Right breast diagnostic mammogram and right beast ultrasound on 01/05/22 further revealed a 0.4 x 0.4 x 0.4 cm irregular high density mass in the right breast suspicious of malignancy @ 10:00 position.  No abnormal lymph nodes were appreciated on [place].   Biopsy of the right breast mass showed grade 2 invasive ductal carcinoma and intermediate grade DCIS.  ER status: 100% positive; PR status 100% positive, Her2 status negative; Proliferation marker Ki67 at 10%; Grade 2. No lymph nodes were examined.  She is here with her daughter today. She is highly involved with her grandchildren and great grandson.   PREVIOUS RADIATION THERAPY: No  PAST MEDICAL HISTORY:  has a past medical history of Anemia, Chest tightness, CKD stage 3 secondary to diabetes (HCC) (10/09/2019), Fatigue, GERD (gastroesophageal reflux disease), Heart palpitations (12/06/2019), Hyperlipidemia, Hypothyroid, Internal hemorrhoids (05/04/2009), Myalgia, Obesity, Prediabetes, Ulcer, and Varicose veins.    PAST SURGICAL HISTORY: Past Surgical History:  Procedure Laterality Date    ABDOMINAL HYSTERECTOMY     Total    CATARACT EXTRACTION Bilateral    Dr. Darel Hong   CYST REMOVAL HAND Left 07/30/2018   NASAL SINUS SURGERY     ROTATOR CUFF REPAIR Left more than 7 years ago   Dr. Eulah Pont and Dr. Loreta Ave   TONSILLECTOMY AND ADENOIDECTOMY      FAMILY HISTORY: family history includes Breast cancer in her cousin; Coronary artery disease (age of onset: 67) in her father; Dementia in her maternal aunt and mother; Lung cancer in her maternal grandmother and mother.  SOCIAL HISTORY:  reports that she quit smoking about 8 years ago. Her smoking use included cigarettes. She started smoking about 56 years ago. She has a 23.50 pack-year smoking history. She has never used smokeless tobacco. She reports that she does not currently use alcohol. She reports that she does not use drugs.  ALLERGIES: Ciprofloxacin, Macrobid [nitrofurantoin macrocrystal], Penicillins, Procaine hcl, Sulfonamide derivatives, Tetanus toxoid, and Tuberculin ppd  MEDICATIONS:  Current Outpatient Medications  Medication Sig Dispense Refill   Acetaminophen (TYLENOL ARTHRITIS PAIN PO) Take by mouth.     aspirin 81 MG tablet Take 81 mg by mouth daily.     atorvastatin (LIPITOR) 40 MG tablet Take 1 tablet Daily  for Cholesterol 90 tablet 3   b complex vitamins tablet Take 1 tablet by mouth daily.     CINNAMON PO Take by mouth. 400mg      diclofenac Sodium (VOLTAREN) 1 % GEL Apply 4 g topically 4 (four) times daily. 4 g 2   dicyclomine (BENTYL) 20 MG tablet Take  1 tablet  3 x /day  if needed  for Nausea, Bloating, Cramping or Diarrhea 270 tablet 1   dorzolamide-timolol (COSOPT) 22.3-6.8 MG/ML ophthalmic solution USE 1 DROP INTO THE AFFECTED EYE 2 TIMES DAILY  1   ezetimibe (ZETIA) 10 MG tablet Take  1 tablet  Daily  for Cholesterol 90 tablet 3   fluconazole (DIFLUCAN) 150 MG tablet Take 1 tablet 2 x /week  if needed for yeast infection 8 tablet 3   furosemide (LASIX) 20 MG tablet Take 1 tablet 2 x /day for BP & Fluid  Retention / Patient knows to take by mouth 180 tablet 3   gabapentin (NEURONTIN) 800 MG tablet Take  1 capsule  3 x /day  for Chronic Pain 270 tablet 3   levothyroxine (SYNTHROID) 75 MCG tablet Take 1 to 1.5 tablet daily as directed on an empty stomach with only water for 30 minutes & No Antacid meds, Calcium or Magnesium for 4 hrs 114 tablet 3   Multiple Vitamin (MULTIVITAMIN) tablet Take 1 tablet by mouth daily.     ondansetron (ZOFRAN) 4 MG tablet Take  1 tablet  3 x /day  if needed for nausea & Vomiting 60 tablet 1   pantoprazole (PROTONIX) 40 MG tablet Take  1 tablet  Daily  to Prevent Heartburn & Indigestion 90 tablet 3   Probiotic Product (PROBIOTIC DAILY PO) Take by mouth.     pseudoephedrine (SUDAFED) 120 MG 12 hr tablet Take   1 tablet    2 x /day (every 12 hours)    for Sinus & Chest Congestion (Patient not taking: Reported on 10/12/2021) 60 tablet 2   No current facility-administered medications for this encounter.    REVIEW OF SYSTEMS: As above in HPI.   PHYSICAL EXAM:  vitals were not taken for this visit.   General: Alert and oriented, in no acute distress HEENT: Head is normocephalic. Extraocular movements are intact. Oropharynx is clear. Heart: Regular in rate and rhythm with no murmurs, rubs, or gallops. Chest: Clear to auscultation bilaterally, with no rhonchi, wheezes, or rales. Skin: No concerning lesions over chest Musculoskeletal: ambulatory, well nourished   Neurologic:  No obvious focalities. Speech is fluent. Coordination is intact. Psychiatric: Judgment and insight are intact. Affect is appropriate. Breasts: no palpable masses appreciated in the breasts or axillae bilaterally.    ECOG = 1  0 - Asymptomatic (Fully active, able to carry on all predisease activities without restriction)  1 - Symptomatic but completely ambulatory (Restricted in physically strenuous activity but ambulatory and able to carry out work of a light or sedentary nature. For example,  light housework, office work)  2 - Symptomatic, <50% in bed during the day (Ambulatory and capable of all self care but unable to carry out any work activities. Up and about more than 50% of waking hours)  3 - Symptomatic, >50% in bed, but not bedbound (Capable of only limited self-care, confined to bed or chair 50% or more of waking hours)  4 - Bedbound (Completely disabled. Cannot carry on any self-care. Totally confined to bed or chair)  5 - Death   Santiago Glad MM, Creech RH, Tormey DC, et al. [phone]). "Toxicity and response criteria of the [facility]". Am. Evlyn Clines. Oncol. 5 (6): 649-55   LABORATORY DATA:  Lab Results  Component Value Date   WBC 9.1 02/02/2022   HGB 14.0 02/02/2022   HCT 42.1 02/02/2022   MCV 88.3 02/02/2022   PLT 254 02/02/2022   CMP     Component Value Date/Time  NA 142 02/02/2022 1154   K 3.5 02/02/2022 1154   CL 108 02/02/2022 1154   CO2 28 02/02/2022 1154   GLUCOSE 129 (H) 02/02/2022 1154   BUN 17 02/02/2022 1154   CREATININE 1.04 (H) 02/02/2022 1154   CREATININE 0.99 01/27/2022 1513   CALCIUM 9.4 02/02/2022 1154   PROT 7.2 02/02/2022 1154   ALBUMIN 4.2 02/02/2022 1154   AST 15 02/02/2022 1154   ALT 15 02/02/2022 1154   ALKPHOS 97 02/02/2022 1154   BILITOT 0.7 02/02/2022 1154   GFRNONAA 56 (L) 02/02/2022 1154   GFRNONAA 62 05/06/2021 1420   GFRAA 72 05/06/2021 1420         RADIOGRAPHY:   As above    IMPRESSION/PLAN: Right breast cancer, stage I, ER+   For the patient's early stage favorable risk breast cancer, we had a thorough discussion about her options for adjuvant therapy. One option would be antiestrogen therapy as discussed with medical oncology. She would take a pill for approximately 5 years. The alternative option (but less standard) would be radiotherapy to the breast. The most aggressive option would be to pursue both modalities.  Of note, I discussed the data from the ONE[employer] al trial in the [place]  Journal of Medicine. She understands that tamoxifen compared to radiation plus tamoxifen demonstrated no survival benefit among the women in this study. The women were 70 years or older with stage I estrogen receptor positive breast cancer. Based on this study, I told the patient that her overall life expectancy should not be affected by adding radiotherapy to antiestrogen medication. She understands that the main benefit of  adding radiotherapy to anti estrogen therapy would be a very small but measurable local control benefit (risk of local recurrence to be lowered from ~9% --> ~2% over a decade).  We discussed the fact that radiotherapy only provides a local control benefit while anti-estrogen pills provide systemic coverage. That being said, the risk of systemic failure is relatively low with her type of breast cancer.  We discussed the risks benefits and side effects of radiotherapy. She understands that the side effects would likely include some skin irritation and fatigue during the weeks of radiation. There is a risk of late effects which include but are not necessarily limited to cosmetic changes and rare lung toxicity. I would anticipate delivering approximately 1-4 weeks of radiotherapy (she is interested in the European 1 week regimen).  After a thorough discussion, she is leaning towards 1 week of ultra hypofractionated radiation therapy. I discussed that this approach is a bit less standard than longer regimens.  The Fast Forward trial (1 week) method has shown a slight increase in normal tissue side effects over the 3 week hypofractionation standard at 5 years of follow-up. This includes effects like induration, telangiectasias, discomfort, and edema.  However, for elderly patients that are keen on the convenience of minimizing the number of procedures/visits to the cancer center, this is a nevertheless a reasonable approach to consider, and the vast majority of patients do very well.  We spoke  about other general acute effects of breast radiation including skin irritation and fatigue as well as breast fibrosis long term, and much less common late effects including internal organ injury or irritation. We spoke about the latest technology that is used to minimize the risk of late effects for patients undergoing radiotherapy to the breast or chest wall. No guarantees of treatment were given. The patient is enthusiastic about proceeding with treatment.  I look forward to participating in the patient's care.  I will await her referral back to me for postoperative follow-up and eventual CT simulation/treatment planning.   On date of service, in total, I spent 50 minutes on this encounter. Patient was seen in person.   __________________________________________   Lonie Peak, MD  This document serves as a record of services personally performed by Lonie Peak, MD. It was created on her behalf by Neena Rhymes, a trained medical scribe. The creation of this record is based on the scribe's personal observations and the provider's statements to them. This document has been checked and approved by the attending provider.

## 2022-02-02 ENCOUNTER — Ambulatory Visit: Payer: PPO | Admitting: Physical Therapy

## 2022-02-02 ENCOUNTER — Inpatient Hospital Stay: Payer: PPO | Attending: Hematology and Oncology | Admitting: Hematology and Oncology

## 2022-02-02 ENCOUNTER — Ambulatory Visit: Payer: Self-pay | Admitting: Genetic Counselor

## 2022-02-02 ENCOUNTER — Encounter: Payer: Self-pay | Admitting: *Deleted

## 2022-02-02 ENCOUNTER — Inpatient Hospital Stay: Payer: PPO

## 2022-02-02 ENCOUNTER — Other Ambulatory Visit: Payer: Self-pay

## 2022-02-02 ENCOUNTER — Other Ambulatory Visit: Payer: Self-pay | Admitting: Surgery

## 2022-02-02 ENCOUNTER — Encounter: Payer: Self-pay | Admitting: General Practice

## 2022-02-02 ENCOUNTER — Encounter: Payer: Self-pay | Admitting: Radiation Oncology

## 2022-02-02 ENCOUNTER — Ambulatory Visit
Admission: RE | Admit: 2022-02-02 | Discharge: 2022-02-02 | Disposition: A | Discharge status: Home / Self Care | Payer: PPO | Source: Ambulatory Visit | Attending: Radiation Oncology | Admitting: Radiation Oncology

## 2022-02-02 DIAGNOSIS — N183 Chronic kidney disease, stage 3 unspecified: Secondary | ICD-10-CM | POA: Diagnosis not present

## 2022-02-02 DIAGNOSIS — Z87891 Personal history of nicotine dependence: Secondary | ICD-10-CM | POA: Insufficient documentation

## 2022-02-02 DIAGNOSIS — Z170 Estrogen receptor positive status [ER+]: Secondary | ICD-10-CM | POA: Diagnosis not present

## 2022-02-02 DIAGNOSIS — Z90710 Acquired absence of both cervix and uterus: Secondary | ICD-10-CM | POA: Diagnosis not present

## 2022-02-02 DIAGNOSIS — C50411 Malignant neoplasm of upper-outer quadrant of right female breast: Secondary | ICD-10-CM

## 2022-02-02 DIAGNOSIS — Z853 Personal history of malignant neoplasm of breast: Secondary | ICD-10-CM

## 2022-02-02 DIAGNOSIS — Z801 Family history of malignant neoplasm of trachea, bronchus and lung: Secondary | ICD-10-CM | POA: Insufficient documentation

## 2022-02-02 DIAGNOSIS — C50911 Malignant neoplasm of unspecified site of right female breast: Secondary | ICD-10-CM | POA: Diagnosis not present

## 2022-02-02 LAB — CBC WITH DIFFERENTIAL (CANCER CENTER ONLY)
Abs Immature Granulocytes: 0.03 10*3/uL (ref 0.00–0.07)
Basophils Absolute: 0.1 10*3/uL (ref 0.0–0.1)
Basophils Relative: 1 %
Eosinophils Absolute: 0.1 10*3/uL (ref 0.0–0.5)
Eosinophils Relative: 1 %
HCT: 42.1 % (ref 36.0–46.0)
Hemoglobin: 14 g/dL (ref 12.0–15.0)
Immature Granulocytes: 0 %
Lymphocytes Relative: 29 %
Lymphs Abs: 2.7 10*3/uL (ref 0.7–4.0)
MCH: 29.4 pg (ref 26.0–34.0)
MCHC: 33.3 g/dL (ref 30.0–36.0)
MCV: 88.3 fL (ref 80.0–100.0)
Monocytes Absolute: 0.7 10*3/uL (ref 0.1–1.0)
Monocytes Relative: 8 %
Neutro Abs: 5.5 10*3/uL (ref 1.7–7.7)
Neutrophils Relative %: 61 %
Platelet Count: 254 10*3/uL (ref 150–400)
RBC: 4.77 MIL/uL (ref 3.87–5.11)
RDW: 13.4 % (ref 11.5–15.5)
WBC Count: 9.1 10*3/uL (ref 4.0–10.5)
nRBC: 0 % (ref 0.0–0.2)

## 2022-02-02 LAB — CMP (CANCER CENTER ONLY)
ALT: 15 U/L (ref 0–44)
AST: 15 U/L (ref 15–41)
Albumin: 4.2 g/dL (ref 3.5–5.0)
Alkaline Phosphatase: 97 U/L (ref 38–126)
Anion gap: 6 (ref 5–15)
BUN: 17 mg/dL (ref 8–23)
CO2: 28 mmol/L (ref 22–32)
Calcium: 9.4 mg/dL (ref 8.9–10.3)
Chloride: 108 mmol/L (ref 98–111)
Creatinine: 1.04 mg/dL — ABNORMAL HIGH (ref 0.44–1.00)
GFR, Estimated: 56 mL/min — ABNORMAL LOW (ref >60)
Glucose, Bld: 129 mg/dL — ABNORMAL HIGH (ref 70–99)
Potassium: 3.5 mmol/L (ref 3.5–5.1)
Sodium: 142 mmol/L (ref 135–145)
Total Bilirubin: 0.7 mg/dL (ref 0.3–1.2)
Total Protein: 7.2 g/dL (ref 6.5–8.1)

## 2022-02-02 LAB — GENETIC SCREENING ORDER

## 2022-02-02 NOTE — Progress Notes (Signed)
[facility] Psychosocial Distress Screening ?Spiritual Care ? ?Met with [name] and her daughter in [facility] to introduce [place] team/resources, reviewing distress screen per protocol.  The patient scored a 10 on the Psychosocial Distress Thermometer which indicates severe distress. Also assessed for distress and other psychosocial needs.  ? ?ONCBCN DISTRESS SCREENING 02/02/2022  ?Screening Type Initial Screening  ?Distress experienced in past week (1-10) 10  ?Emotional problem type Adjusting to illness  ?Physical Problem type Sleep/insomnia;Tingling hands/feet;Swollen arms/legs  ?Referral to support programs Yes  ? ?[name] notes that she is significantly relieved after her "great experience" of meeting her team and learning about the scope of her diagnosis and treatment, though of course she is "still processing" all the new information. She anticipates that she will sleep considerably better tonight. ? ?One source of meaning and pride for her is her granddaughter's status as salutatorian and aspiring radiation tech. ? ?Follow up needed: No. [name] is aware of ongoing availability of chaplain and Sales promotion account executive Care/[facility] support programming. She plans to explore as needed/desired. ? ? ?Chaplain Lorrin Jackson, MDiv, [facility] ?Pager [phone] ?Voicemail [phone] ? ? ? ? ?  ?

## 2022-02-02 NOTE — Progress Notes (Signed)
[name] was seen by a genetic counselor during the breast multidisciplinary clinic on February 02, 2022. In addition to her personal history of breast cancer, she reported a personal history of melanoma (shin and arm; approximately 10 years ago; s/p excision).  She reported a family history of lung cancer in her mother and maternal grandmother in their 9s and a family  history of breast cancer in a paternal female cousin at age 74.  She does not meet NCCN criteria for genetic testing at this time. She was still offered genetic counseling and testing but declined. We encourage her to contact us if there are any changes to her personal or family history of cancer. If she meets NCCN criteria based on the updated personal/family history, she would be recommended to have genetic counseling and testing.  ? ? ?

## 2022-02-02 NOTE — Assessment & Plan Note (Signed)
Screening mammogram detected 0.6 cm right breast mass.  By ultrasound it measured 0.4 cm.  Stereotactic biopsy revealed grade 2 IDC and intermediate grade DCIS ER 100%, PR 100%, HER2 negative, Ki-67 10% ? ?Pathology and radiology counseling:Discussed with the patient, the details of pathology including the type of breast cancer,the clinical staging, the significance of ER, PR and HER-2/neu receptors and the implications for treatment. After reviewing the pathology in detail, we proceeded to discuss the different treatment options between surgery, radiation, and, antiestrogen therapies. ? ?Recommendations: ?1. Breast conserving surgery followed by ?2. +/-Adjuvant radiation therapy followed by ?3. Adjuvant antiestrogen therapy ? ?  ?Return to clinic after surgery to discuss final pathology report and then determine the adjuvant treatment plan ?

## 2022-02-02 NOTE — Research (Signed)
Trial:  [employer] 2021-05 - Specimen Collection Study to Evaluate Biomarkers in Subjects with Cancer   ?Patient [name] was identified by Dr. Lindi Adie as a potential candidate for the above listed study.  This Clinical Research Nurse met with [name], [id number], on 02/02/22 in a manner and location that ensures patient privacy to discuss participation in the above listed research study.  Patient is Accompanied by her daughter, [name] .  A copy of the informed consent document with embedded HIPAA language was provided to the patient.  Patient reads, speaks, and understands [place].   ?Patient was provided with the business card of this Nurse and encouraged to contact the research team with any questions.  Approximately 10 minutes were spent with the patient reviewing the informed consent documents.  Patient was provided the option of taking informed consent documents home to review and was encouraged to review at their convenience with their support network, including other care providers. Patient took the consent documents home to review.  ?Foye Spurling, BSN, RN, CCRP ?Clinical Research Nurse II ?02/02/2022 3:46 PM ? ?

## 2022-02-07 ENCOUNTER — Telehealth: Payer: Self-pay | Admitting: *Deleted

## 2022-02-07 NOTE — Telephone Encounter (Signed)
[employer] 2021-05 - Specimen Collection Study to Evaluate Biomarkers in Subjects with Cancer   ?Called patient to follow up on her interest in this study and see if she has any questions. Patient states she had to chance to review the consent form and she would like to participate. She does not have any questions about the study but does want to know if she can have her blood drawn at the [facility] since it is closer to her home. Informed patient she can have her blood drawn there. Research nurse can meet her to review and sign consent prior to blood draw. Patient agreed to meet research at 1:00 PM tomorrow 02/08/22 in the lobby of the [facility] [place]. Thanked patient for her time and asked her to call back if any questions in the meantime. She verbalized understanding.  ?Foye Spurling, BSN, RN, CCRP ?Clinical Research Nurse II ?02/07/2022 2:13 PM ? ?

## 2022-02-08 ENCOUNTER — Encounter: Payer: Self-pay | Admitting: *Deleted

## 2022-02-08 ENCOUNTER — Inpatient Hospital Stay: Payer: PPO

## 2022-02-08 ENCOUNTER — Other Ambulatory Visit: Payer: Self-pay

## 2022-02-08 DIAGNOSIS — Z006 Encounter for examination for normal comparison and control in clinical research program: Secondary | ICD-10-CM

## 2022-02-08 NOTE — Research (Signed)
[employer] 2021-05 - Specimen Collection Study to Evaluate Biomarkers in Subjects with Cancer  ? ?02/08/22 ? ?This Coordinator has reviewed this patient's inclusion and exclusion criteria and confirmed [name] is eligible for study participation.  Patient will continue with enrollment. ? ?Clabe Seal ?Clinical Research Coordinator I  ?02/08/22 2:23 PM  ?

## 2022-02-08 NOTE — Research (Addendum)
Trial Name:  [employer] 2021-05 - Specimen Collection Study to Evaluate Biomarkers in Subjects with Cancer   ? ?Patient [name] was identified by Dr. Lindi Adie as a potential candidate for the above listed study.  This Clinical Research Nurse met with [name], [id number] on 02/08/22 in a manner and location that ensures patient privacy to discuss participation in the above listed research study.  Patient is Unaccompanied.  Patient was previously provided with informed consent documents.  Patient confirmed they have read the informed consent documents. ? ?As outlined in the informed consent form, this Nurse and [name] discussed the purpose of the research study, the investigational nature of the study, study procedures and requirements for study participation, potential risks and benefits of study participation, as well as alternatives to participation.  This study is not blinded or double-blinded. The patient understands participation is voluntary and they may withdraw from study participation at any time.  This study does not involve randomization.  This study does not involve an investigational drug or device. This study does not involve a placebo. Patient understands enrollment is pending full eligibility review.  ? ?Confidentiality and how the patient's information will be used as part of study participation were discussed.  Patient was informed there is reimbursement provided for their time and effort spent on trial participation.  The patient is encouraged to discuss research study participation with their insurance provider to determine what costs they may incur as part of study participation, including research related injury.   ? ?All questions were answered to patient's satisfaction.  The informed consent with embedded HIPAA language was reviewed page by page.  The patient's mental and emotional status is appropriate to provide informed consent, and the patient verbalizes an  understanding of study participation.  Patient has agreed to participate in the above listed research study and has voluntarily signed the informed consent version date 04 Dec 2020, revised 20 Dec 2021 04 Dec 2020, revised 20 Dec 2021 on 02/08/22 at 1:15PM.  The patient was provided with a copy of the signed informed consent form with embedded HIPAA language for their reference.  No study specific procedures were obtained prior to the signing of the informed consent document.  Approximately 15 minutes were spent with the patient reviewing the informed consent documents.  Patient was not requested to complete a Release of Information form. ? ? ?History: This patient denies past medical history of High Blood Pressure, Coronary Artery Disease, Lupus, Rheumatoid Arthritis, Diabetes and Lynch Syndrome.  She was diagnosed with Melanoma Skin cancer in 2013 and had excision but no other treatment. This patient is not taking magnesium supplements.  The patient does report family history of cancer in 1st or 2nd degree relatives.  Patient's mother and grandmother both had Lung Cancer.  The patient reports 27 year former history of alcohol consumption, approximately 3 drinks per week on average.  Last drink was in 1999.  The patient reports she quit smoking in 2014 after 47 years. She smoked on average 1/2 pack per day. She denies history of any other tobacco use.  ?  ?Blood Collection: Research blood obtained by [facility] venipuncture. Patient tolerated well without any adverse events.  ?  ?Gift Card: $50 gift card given to patient for her participation in this study.  ?  ?Eligibility criteria reviewed with patient. This Nurse has reviewed this patient's inclusion and exclusion criteria and confirmed [name] is eligible for study participation.  Patient will continue with  enrollment. ? ?Foye Spurling, BSN, RN, CCRP ?Clinical Research Nurse II ?02/08/2022 2:51 PM ? ?  ?

## 2022-02-10 ENCOUNTER — Telehealth: Payer: Self-pay | Admitting: Radiation Oncology

## 2022-02-10 ENCOUNTER — Telehealth: Payer: Self-pay | Admitting: *Deleted

## 2022-02-10 ENCOUNTER — Encounter: Payer: Self-pay | Admitting: *Deleted

## 2022-02-10 DIAGNOSIS — Z170 Estrogen receptor positive status [ER+]: Secondary | ICD-10-CM

## 2022-02-10 DIAGNOSIS — C50411 Malignant neoplasm of upper-outer quadrant of right female breast: Secondary | ICD-10-CM

## 2022-02-10 NOTE — Telephone Encounter (Signed)
Spoke with patient to follow up from [facility] 3/15 and assess navigation needs.  Patient denies any questions or concerns at this time.Encouraged her to call should anything arise.  Patient verbalized understanding. ?

## 2022-02-10 NOTE — Telephone Encounter (Signed)
3/23 @ 10:39 pm Left voicemail for patient to call our office.   ?

## 2022-02-11 ENCOUNTER — Telehealth: Payer: Self-pay | Admitting: Hematology and Oncology

## 2022-02-11 NOTE — Telephone Encounter (Signed)
.  Called patient to schedule appointment per 3/23 inbasket, patient is aware of date and time.   ?

## 2022-02-15 ENCOUNTER — Inpatient Hospital Stay
Admission: RE | Admit: 2022-02-15 | Discharge: 2022-02-15 | Disposition: A | Discharge status: Home / Self Care | Payer: Self-pay | Source: Ambulatory Visit | Attending: Radiation Oncology | Admitting: Radiation Oncology

## 2022-02-15 ENCOUNTER — Other Ambulatory Visit: Payer: Self-pay | Admitting: Radiation Oncology

## 2022-02-15 DIAGNOSIS — C50411 Malignant neoplasm of upper-outer quadrant of right female breast: Secondary | ICD-10-CM

## 2022-02-16 ENCOUNTER — Encounter: Payer: Self-pay | Admitting: *Deleted

## 2022-02-17 NOTE — Progress Notes (Signed)
Surgical Instructions ? ? ? Your procedure is scheduled on 02/24/22. ? Report to [facility] Main Entrance "A" at 1:30 P.M., then check in with the Admitting office. ? Call this number if you have problems the morning of surgery: ? [phone] ? ? If you have any questions prior to your surgery date call [phone]: Open Monday-Friday 8am-4pm ? ? ? Remember: ? Do not eat after midnight the night before your surgery ? ?You may drink clear liquids until 12:30pm the morning of your surgery.   ?Clear liquids allowed are: Water, Non-Citrus Juices (without pulp), Carbonated Beverages, Clear Tea, Black Coffee ONLY (NO MILK, CREAM OR POWDERED CREAMER of any kind), and Gatorade ? ?Patient Instructions ? ?The day of surgery (if you have diabetes): ?Drink ONE (1) 12 oz G2 given to you in your pre admission testing appointment by 12:30pm the morning of surgery. Drink in one sitting. Do not sip.  ?This drink was given to you during your hospital  ?pre-op appointment visit.  ?Nothing else to drink after completing the  ?12 oz bottle of G2. ? ?       If you have questions, please contact your surgeon?s office. ? ?  ? Take these medicines the morning of surgery with A SIP OF WATER:  ?atorvastatin (LIPITOR) ?ezetimibe (ZETIA) ?dorzolamide-timolol (COSOPT) ?famotidine (PEPCID) ?gabapentin (NEURONTIN) ?levothyroxine (SYNTHROID)  ?pantoprazole (PROTONIX) ? ?IF NEEDED: ?acetaminophen (TYLENOL)  ?dicyclomine (BENTYL)  ?ondansetron [facility]) ? ?As of today, STOP taking any Aspirin (unless otherwise instructed by your surgeon) Aleve, Naproxen, Ibuprofen, Motrin, Advil, Goody's, BC's, all herbal medications, fish oil, diclofenac Sodium (VOLTAREN) gel and all vitamins. ? ?         ?Do not wear jewelry or makeup ?Do not wear lotions, powders, perfumes/colognes, or deodorant. ?Do not shave 48 hours prior to surgery.   ?Do not bring valuables to the hospital. ?Do not wear nail polish, gel polish, artificial nails, or any other type of covering  on natural nails (fingers and toes) ?If you have artificial nails or gel coating that need to be removed by a nail salon, please have this removed prior to surgery. Artificial nails or gel coating may interfere with anesthesia's ability to adequately monitor your vital signs. ? ?[facility] is not responsible for any belongings or valuables. .  ? ?Do NOT Smoke (Tobacco/Vaping)  24 hours prior to your procedure ? ?If you use a CPAP at night, you may bring your mask for your overnight stay. ?  ?Contacts, glasses, hearing aids, dentures or partials may not be worn into surgery, please bring cases for these belongings ?  ?For patients admitted to the hospital, discharge time will be determined by your treatment team. ?  ?Patients discharged the day of surgery will not be allowed to drive home, and someone needs to stay with them for 24 hours. ? ? ?SURGICAL WAITING ROOM VISITATION ?Patients having surgery or a procedure in a hospital may have two support people. ?Children under the age of 58 must have an adult with them who is not the patient. ?They may stay in the waiting area during the procedure and may switch out with other visitors. If the patient needs to stay at the hospital during part of their recovery, the visitor guidelines for inpatient rooms apply. ? ?Please refer to the [facility] website for the visitor guidelines for Inpatients (after your surgery is over and you are in a regular room).  ? ? ? ? ? ?Special instructions:   ? ?Oral Hygiene is  also important to reduce your risk of infection.  Remember - BRUSH YOUR TEETH THE MORNING OF SURGERY WITH YOUR REGULAR TOOTHPASTE ? ? ?[facility]- Preparing For Surgery ? ?Before surgery, you can play an important role. Because skin is not sterile, your skin needs to be as free of germs as possible. You can reduce the number of germs on your skin by washing with CHG (chlorahexidine gluconate) Soap before surgery.  CHG is an antiseptic cleaner which kills germs and  bonds with the skin to continue killing germs even after washing.   ? ? ?Please do not use if you have an allergy to CHG or antibacterial soaps. If your skin becomes reddened/irritated stop using the CHG.  ?Do not shave (including legs and underarms) for at least 48 hours prior to first CHG shower. It is OK to shave your face. ? ?Please follow these instructions carefully. ?  ? ? Shower the NIGHT BEFORE SURGERY and the MORNING OF SURGERY with CHG Soap.  ? If you chose to wash your hair, wash your hair first as usual with your normal shampoo. After you shampoo, rinse your hair and body thoroughly to remove the shampoo.  Then [employer] and genitals (private parts) with your normal soap and rinse thoroughly to remove soap. ? ?After that Use CHG Soap as you would any other liquid soap. You can apply CHG directly to the skin and wash gently with a scrungie or a clean washcloth.  ? ?Apply the CHG Soap to your body ONLY FROM THE NECK DOWN.  Do not use on open wounds or open sores. Avoid contact with your eyes, ears, mouth and genitals (private parts). Wash Face and genitals (private parts)  with your normal soap.  ? ?Wash thoroughly, paying special attention to the area where your surgery will be performed. ? ?Thoroughly rinse your body with warm water from the neck down. ? ?DO NOT shower/wash with your normal soap after using and rinsing off the CHG Soap. ? ?Pat yourself dry with a CLEAN TOWEL. ? ?Wear CLEAN PAJAMAS to bed the night before surgery ? ?Place CLEAN SHEETS on your bed the night before your surgery ? ?DO NOT SLEEP WITH PETS. ? ? ?Day of Surgery: ?Take a shower with CHG soap. ?Wear Clean/Comfortable clothing the morning of surgery ?Do not apply any deodorants/lotions.   ?Remember to brush your teeth WITH YOUR REGULAR TOOTHPASTE. ? ? ? ?If you received a COVID test during your pre-op visit  it is requested that you wear a mask when out in public, stay away from anyone that may not be feeling well and notify your  surgeon if you develop symptoms. If you have been in contact with anyone that has tested positive in the last 10 days please notify you surgeon. ? ?  ?Please read over the following fact sheets that you were given.  ? ?

## 2022-02-18 ENCOUNTER — Encounter (HOSPITAL_COMMUNITY): Payer: Self-pay

## 2022-02-18 ENCOUNTER — Encounter (HOSPITAL_COMMUNITY)
Admission: RE | Admit: 2022-02-18 | Discharge: 2022-02-18 | Disposition: A | Discharge status: Home / Self Care | Payer: PPO | Source: Ambulatory Visit | Attending: Surgery | Admitting: Surgery

## 2022-02-18 ENCOUNTER — Other Ambulatory Visit: Payer: Self-pay

## 2022-02-18 DIAGNOSIS — C50911 Malignant neoplasm of unspecified site of right female breast: Secondary | ICD-10-CM | POA: Insufficient documentation

## 2022-02-18 DIAGNOSIS — I129 Hypertensive chronic kidney disease with stage 1 through stage 4 chronic kidney disease, or unspecified chronic kidney disease: Secondary | ICD-10-CM | POA: Insufficient documentation

## 2022-02-18 DIAGNOSIS — R7303 Prediabetes: Secondary | ICD-10-CM | POA: Diagnosis not present

## 2022-02-18 DIAGNOSIS — N183 Chronic kidney disease, stage 3 unspecified: Secondary | ICD-10-CM | POA: Diagnosis not present

## 2022-02-18 DIAGNOSIS — I2510 Atherosclerotic heart disease of native coronary artery without angina pectoris: Secondary | ICD-10-CM | POA: Insufficient documentation

## 2022-02-18 DIAGNOSIS — Z01818 Encounter for other preprocedural examination: Secondary | ICD-10-CM | POA: Diagnosis not present

## 2022-02-18 HISTORY — DX: Unspecified osteoarthritis, unspecified site: M19.90

## 2022-02-18 HISTORY — DX: Myoneural disorder, unspecified: G70.9

## 2022-02-18 NOTE — Progress Notes (Signed)
PCP - Dr. Unk Pinto ?Cardiologist - Dr. Buford Dresser ? ?PPM/ICD - n/a ?Device Orders - n/a ?Rep Notified - n/a ? ?Chest x-ray - n/a ?EKG - 06/30/21 ?Stress Test - denies ?ECHO - denies ?Cardiac Cath - denies ? ?Sleep Study - denies ?CPAP - n/a ? ?Pre-diabetes. Does not take medicine or monitor blood sugar.  ? ?Blood Thinner Instructions: n/a ?Aspirin Instructions: As of today stop taking Aspirin unless otherwise instructed by your surgeon.  ? ?ERAS Protcol - Yes ?PRE-SURGERY Ensure or G2- Ensure ? ?COVID TEST- N/A ? ? ?Anesthesia review: Yes. Seed lumpectomy ? ?Patient denies shortness of breath, fever, cough and chest pain at PAT appointment ? ? ?All instructions explained to the patient, with a verbal understanding of the material. Patient agrees to go over the instructions while at home for a better understanding. Patient also instructed to self quarantine after being tested for COVID-19. The opportunity to ask questions was provided. ? ? ?

## 2022-02-21 ENCOUNTER — Encounter: Payer: Self-pay | Admitting: Podiatry

## 2022-02-21 ENCOUNTER — Ambulatory Visit: Payer: PPO | Admitting: Podiatry

## 2022-02-21 DIAGNOSIS — M79674 Pain in right toe(s): Secondary | ICD-10-CM | POA: Diagnosis not present

## 2022-02-21 DIAGNOSIS — L89892 Pressure ulcer of other site, stage 2: Secondary | ICD-10-CM | POA: Diagnosis not present

## 2022-02-21 DIAGNOSIS — B351 Tinea unguium: Secondary | ICD-10-CM

## 2022-02-21 DIAGNOSIS — M79675 Pain in left toe(s): Secondary | ICD-10-CM | POA: Diagnosis not present

## 2022-02-21 DIAGNOSIS — E1142 Type 2 diabetes mellitus with diabetic polyneuropathy: Secondary | ICD-10-CM | POA: Diagnosis not present

## 2022-02-21 DIAGNOSIS — L84 Corns and callosities: Secondary | ICD-10-CM

## 2022-02-21 NOTE — Progress Notes (Signed)
Anesthesia Chart Review: ? ? Case: [id number] Date/Time: 02/24/22 1515  ? Procedure: RIGHT BREAST LUMPECTOMY WITH RADIOACTIVE SEED LOCALIZATION (Right: Breast)  ? Anesthesia type: General  ? Pre-op diagnosis: RIGHT BREAST CANCER  ? Location: [facility] OR ROOM 06 / [facility] OR  ? Surgeons: [name], MD  ? ?  ? ? ?DISCUSSION: ?Pt is 76 years old with hx CAD (by CT), HTN, prediabetes, CKD (stage 3) ? ?VS: BP (!) 148/62   Pulse (!) 55   Temp 36.4 ?C (Oral)   Resp 18   Ht '5\' 7"'$  (1.702 m)   Wt 99.7 kg   SpO2 98%   BMI 34.43 kg/m?  ? ?PROVIDERS: ?- PCP is Unk Pinto, MD ?- Cardiologist is Buford Dresser, MD. Last office visit 06/30/21 ? ? ?LABS: Labs reviewed: Acceptable for surgery. ?- CBC w/diff 02/02/22: normal ?- CMP 02/02/22: glucose 129; Cr 1.04. Otherwise normal ? ? ?EKG 06/30/21: NSR with sinus arrhythmia, poor R wave progression  ? ? ?CV: ?Cardiac CT 12/26/19 ?1. Coronary calcium score of 693. This was 78th percentile for age and sex matched control. ?2. Normal coronary origin with right dominance.  ?3. Calcified plaque causing moderate stenosis in the prox LAD, distal RCA, proximal LCx. ?4.CAD-RADS 3. Moderate stenosis. Consider symptom-guided anti-ischemic pharmacotherapy as well as risk factor modification per guideline directed care. Additional analysis with CT FFR will be submitted and reported separately. ?  ?FFR 12/26/19 ?1. Left Main:  No significant stenosis.  ?2. LAD: No significant stenosis.  FFR 0.88 ?3. LCX: No significant stenosis.  FFR 0.9 ?4. RCA: No significant stenosis.  FFR 0.91 ?- IMPRESSION: ?1.  CT FFR analysis didn't show any significant stenosis. ? ? ?Past Medical History:  ?Diagnosis Date  ? Anemia   ? Arthritis   ? Chest tightness   ? CKD stage 3 secondary to diabetes ([place]) 10/09/2019  ? Fatigue   ? GERD (gastroesophageal reflux disease)   ? Heart palpitations 12/06/2019  ? Hyperlipidemia   ? Hypothyroid   ? Internal hemorrhoids 05/04/2009  ? Qualifier: Diagnosis of  By: Nelson-Smith  CMA (AAMA), Dottie    ? Melanoma ([place]) 2013  ? Excision/surgery only. No other treatment  ? Myalgia   ? Neuromuscular disorder ([place])   ? Neuropathy  ? Obesity   ? Prediabetes   ? Ulcer   ? Varicose veins   ? ? ?Past Surgical History:  ?Procedure Laterality Date  ? ABDOMINAL HYSTERECTOMY    ? Total   ? CATARACT EXTRACTION Bilateral   ? Dr. Tommy Rainwater  ? CYST REMOVAL HAND Left 07/30/2018  ? NASAL SINUS SURGERY    ? ROTATOR CUFF REPAIR Left more than 7 years ago  ? Dr. Percell Miller and Dr. Earleen Newport  ? TONSILLECTOMY AND ADENOIDECTOMY    ? ? ?MEDICATIONS: ? acetaminophen (TYLENOL) 650 MG CR tablet  ? aspirin 81 MG tablet  ? atorvastatin (LIPITOR) 40 MG tablet  ? cholecalciferol (VITAMIN D3) 25 MCG (1000 UNIT) tablet  ? cyanocobalamin 1000 MCG tablet  ? diclofenac Sodium (VOLTAREN) 1 % GEL  ? dicyclomine (BENTYL) 20 MG tablet  ? dorzolamide-timolol (COSOPT) 22.3-6.8 MG/ML ophthalmic solution  ? ezetimibe (ZETIA) 10 MG tablet  ? famotidine (PEPCID) 40 MG tablet  ? fluconazole (DIFLUCAN) 150 MG tablet  ? furosemide (LASIX) 20 MG tablet  ? gabapentin (NEURONTIN) 800 MG tablet  ? levothyroxine (SYNTHROID) 75 MCG tablet  ? Multiple Vitamin (MULTIVITAMIN) tablet  ? ondansetron (ZOFRAN) 4 MG tablet  ? OVER THE COUNTER MEDICATION  ? OVER  THE COUNTER MEDICATION  ? pantoprazole (PROTONIX) 40 MG tablet  ? Probiotic Product (PROBIOTIC DAILY PO)  ? pseudoephedrine (SUDAFED) 120 MG 12 hr tablet  ? ?No current facility-administered medications for this encounter.  ? ? ?If no changes, I anticipate pt can proceed with surgery as scheduled.  ? ?Willeen Cass, PhD, FNP-BC ?[facility]/Anesthesiology ?Phone: [phone] ?02/21/2022 9:19 AM ? ? ? ? ? ? ?

## 2022-02-21 NOTE — Anesthesia Preprocedure Evaluation (Addendum)
Anesthesia Evaluation  ?Patient identified by MRN, date of birth, ID band ?Patient awake ? ? ? ?Reviewed: ?Allergy & Precautions, NPO status , Patient's Chart, lab work & pertinent test results ? ?Airway ?Mallampati: I ? ? ? ? ? ? Dental ?no notable dental hx. ? ?  ?Pulmonary ?neg pulmonary ROS, former smoker,  ?  ?Pulmonary exam normal ? ? ? ? ? ? ? Cardiovascular ?Normal cardiovascular exam ? ? ?  ?Neuro/Psych ?negative psych ROS  ? GI/Hepatic ?GERD  Medicated and Controlled,  ?Endo/Other  ?diabetesHypothyroidism  ? Renal/GU ?  ? ?  ?Musculoskeletal ? ? Abdominal ?Normal abdominal exam  (+)   ?Peds ? Hematology ?  ?Anesthesia Other Findings ? ? Reproductive/Obstetrics ? ?  ? ? ? ? ? ? ? ? ? ? ? ? ? ?  ?  ? ? ? ? ? ? ? ?Anesthesia Physical ?Anesthesia Plan ? ?ASA: 2 ? ?Anesthesia Plan: General  ? ?Post-op Pain Management:   ? ?Induction: Intravenous ? ?PONV Risk Score and Plan: 3 and Ondansetron ? ?Airway Management Planned: LMA ? ?Additional Equipment: None ? ?Intra-op Plan:  ? ?Post-operative Plan:  ? ?Informed Consent: I have reviewed the patients History and Physical, chart, labs and discussed the procedure including the risks, benefits and alternatives for the proposed anesthesia with the patient or authorized representative who has indicated his/her understanding and acceptance.  ? ? ? ?Dental advisory given ? ?Plan Discussed with: CRNA ? ?Anesthesia Plan Comments: (See APP note by Durel Salts, FNP )  ? ? ? ? ? ?Anesthesia Quick Evaluation ? ?

## 2022-02-23 DIAGNOSIS — C50411 Malignant neoplasm of upper-outer quadrant of right female breast: Secondary | ICD-10-CM | POA: Diagnosis not present

## 2022-02-23 NOTE — H&P (Signed)
?REFERRING PHYSICIAN: Self ? ?PROVIDER: Beverlee Nims, MD ? ?MRN: [id number] ?DOB: 16-Mar-1946 ?DATE OF ENCOUNTER: 02/02/2022 ?Subjective  ? ?Chief Complaint: Breast Cancer ? ? ?History of Present Illness: ?[name] is a 76 y.o. female who is seen today as an office consultation for evaluation of Breast Cancer ?.  ? ?This is a 76 year old female who was found on recent screening mammography to have a small mass in the upper outer quadrant of the right breast. On mammography it measured 6 mm but by ultrasound it measured only 4 mm. She underwent a biopsy showing invasive and in situ ductal carcinoma which was grade 2. It was 100% ER/PR positive, HER2 negative, Ki-67 of 10%. She has had no problems with breast in the past. She has had multiple surgical procedures without any issues regarding anesthesia. She has no cardiopulmonary problems. She denies nipple discharge. ? ?Review of Systems: ?A complete review of systems was obtained from the patient. I have reviewed this information and discussed as appropriate with the patient. See HPI as well for other ROS. ? ?ROS  ? ?Medical History: ?Past Medical History:  ?Diagnosis Date  ? Arthritis  ? GERD (gastroesophageal reflux disease)  ? Glaucoma (increased eye pressure)  ? History of cancer  ? Hyperlipidemia  ? Thyroid disease  ? ?There is no problem list on file for this patient. ? ?Past Surgical History:  ?Procedure Laterality Date  ? HYSTERECTOMY  ? JOINT REPLACEMENT  ? ? ?Allergies  ?Allergen Reactions  ? Ciprofloxacin Unknown  ? Procaine Hcl Unknown  ?REACTION: difficulty breathing  ? Sulfa (Sulfonamide Antibiotics) Unknown  ?REACTION: hives  ? Tetanus Toxoid Fluid Unknown  ?REACTION: arm swells  ? Tuberculin Ppd Swelling  ? ?Current Outpatient Medications on File Prior to Visit  ?Medication Sig Dispense Refill  ? acetaminophen (TYLENOL) 650 MG ER tablet Take by mouth  ? aspirin 81 MG EC tablet Take 81 mg by mouth once daily  ? atorvastatin (LIPITOR) 40 MG  tablet Take 1 tablet by mouth once daily  ? b complex vitamins tablet Take 1 tablet by mouth once daily  ? diclofenac (VOLTAREN) 1 % topical gel Apply 4 g topically 4 (four) times daily  ? dicyclomine (BENTYL) 20 mg tablet Take 1 tablet 3 x /day if needed for Nausea, Bloating, Cramping or Diarrhea  ? dorzolamide-timoloL (COSOPT) 22.3-6.8 mg/mL ophthalmic solution USE 1 DROP INTO THE AFFECTED EYE 2 TIMES DAILY  ? ezetimibe (ZETIA) 10 mg tablet  ? fluconazole (DIFLUCAN) 150 MG tablet Take 1 tablet 2 x /week if needed for yeast infection  ? FUROsemide (LASIX) 20 MG tablet Take 1 tablet 2 x /day for BP & Fluid Retention / Patient knows to take by mouth  ? gabapentin (NEURONTIN) 800 MG tablet Take 1 capsule 3 x /day for Chronic Pain  ? levothyroxine (SYNTHROID) 75 MCG tablet  ? multivitamin tablet Take 1 tablet by mouth once daily  ? ondansetron (ZOFRAN) 4 MG tablet Take 1 tablet 3 x /day if needed for nausea & Vomiting  ? pantoprazole (PROTONIX) 40 MG DR tablet Take 1 tablet Daily to Prevent Heartburn & Indigestion  ? ?No current facility-administered medications on file prior to visit.  ? ?Family History  ?Problem Relation Age of Onset  ? Coronary Artery Disease (Blocked arteries around heart) Father  ? Deep vein thrombosis (DVT or abnormal blood clot formation) Daughter  ? ? ?Social History  ? ?Tobacco Use  ?Smoking Status Former  ? Types: Cigarettes  ? Quit date: 2014  ?  Years since quitting: 9.2  ?Smokeless Tobacco Never  ? ? ?Social History  ? ?Socioeconomic History  ? Marital status: Married  ?Tobacco Use  ? Smoking status: Former  ?Types: Cigarettes  ?Quit date: 2014  ?Years since quitting: 9.2  ? Smokeless tobacco: Never  ?Substance and Sexual Activity  ? Alcohol use: Not Currently  ? Drug use: Not Currently  ? ?Objective:  ?There were no vitals filed for this visit.  ?There is no height or weight on file to calculate BMI. ? ?Physical Exam  ? ?She appears well on exam ? ?There are no palpable breast masses.  There is no ecchymosis from biopsy. There is no axillary adenopathy on either side. The nipple areolar complexes are normal ? ?Labs, Imaging and Diagnostic Testing: ?I reviewed results ? ?Assessment and Plan:  ? ?Diagnoses and all orders for this visit: ? ?Invasive ductal carcinoma of breast, female, right (CMS-HCC) ? ? ? ?We have discussed at length in a multidisciplinary breast cancer conference. I discussed the diagnosis with her as well as surgical options for breast cancer. We discussed both breast conservation and mastectomy. She wishes to proceed with breast conservation. I next discussed proceeding with a right breast radioactive seed guided lumpectomy. I discussed the procedure in detail. We discussed the risks of surgery which includes but is not limited to bleeding, infection, the need for further surgery positive, cardiopulmonary issues, postoperative recovery, etc. She understands and wished to proceed with surgery which will be scheduled  ?

## 2022-02-24 ENCOUNTER — Encounter (HOSPITAL_COMMUNITY)
Admission: RE | Disposition: A | Discharge status: Home / Self Care | Payer: Self-pay | Source: Home / Self Care | Attending: Surgery

## 2022-02-24 ENCOUNTER — Encounter (HOSPITAL_COMMUNITY): Payer: Self-pay | Admitting: Surgery

## 2022-02-24 ENCOUNTER — Ambulatory Visit (HOSPITAL_COMMUNITY): Payer: PPO | Admitting: Emergency Medicine

## 2022-02-24 ENCOUNTER — Ambulatory Visit (HOSPITAL_BASED_OUTPATIENT_CLINIC_OR_DEPARTMENT_OTHER): Payer: PPO | Admitting: Anesthesiology

## 2022-02-24 ENCOUNTER — Ambulatory Visit (HOSPITAL_COMMUNITY)
Admission: RE | Admit: 2022-02-24 | Discharge: 2022-02-24 | Disposition: A | Discharge status: Home / Self Care | Payer: PPO | Attending: Surgery | Admitting: Surgery

## 2022-02-24 ENCOUNTER — Other Ambulatory Visit: Payer: Self-pay

## 2022-02-24 DIAGNOSIS — C50911 Malignant neoplasm of unspecified site of right female breast: Secondary | ICD-10-CM | POA: Diagnosis not present

## 2022-02-24 DIAGNOSIS — Z79899 Other long term (current) drug therapy: Secondary | ICD-10-CM | POA: Diagnosis not present

## 2022-02-24 DIAGNOSIS — Z87891 Personal history of nicotine dependence: Secondary | ICD-10-CM | POA: Diagnosis not present

## 2022-02-24 DIAGNOSIS — Z170 Estrogen receptor positive status [ER+]: Secondary | ICD-10-CM | POA: Diagnosis not present

## 2022-02-24 DIAGNOSIS — C50411 Malignant neoplasm of upper-outer quadrant of right female breast: Secondary | ICD-10-CM | POA: Insufficient documentation

## 2022-02-24 DIAGNOSIS — K219 Gastro-esophageal reflux disease without esophagitis: Secondary | ICD-10-CM | POA: Insufficient documentation

## 2022-02-24 HISTORY — PX: BREAST LUMPECTOMY WITH RADIOACTIVE SEED LOCALIZATION: SHX6424

## 2022-02-24 HISTORY — DX: Prediabetes: R73.03

## 2022-02-24 SURGERY — BREAST LUMPECTOMY WITH RADIOACTIVE SEED LOCALIZATION
Anesthesia: General | Site: Breast | Laterality: Right

## 2022-02-24 MED ORDER — EPHEDRINE SULFATE-NACL 50-0.9 MG/10ML-% IV SOSY
PREFILLED_SYRINGE | INTRAVENOUS | Status: DC | PRN
Start: 2022-02-24 — End: 2022-02-24
  Administered 2022-02-24: 5 mg via INTRAVENOUS

## 2022-02-24 MED ORDER — ENSURE PRE-SURGERY PO LIQD: 296.0000 mL | Freq: Once | ORAL | Status: DC

## 2022-02-24 MED ORDER — CHLORHEXIDINE GLUCONATE 0.12 % MT SOLN
15.0000 mL | OROMUCOSAL | Status: AC
  Filled 2022-02-24: qty 15

## 2022-02-24 MED ORDER — ACETAMINOPHEN 500 MG PO TABS
ORAL_TABLET | ORAL | Status: AC
  Administered 2022-02-24: 1000 mg via ORAL
  Filled 2022-02-24: qty 2

## 2022-02-24 MED ORDER — CEFAZOLIN SODIUM-DEXTROSE 2-4 GM/100ML-% IV SOLN
2.0000 g | INTRAVENOUS | Status: AC
  Administered 2022-02-24: 2 g via INTRAVENOUS

## 2022-02-24 MED ORDER — BUPIVACAINE-EPINEPHRINE 0.25% -1:200000 IJ SOLN
INTRAMUSCULAR | Status: DC | PRN
  Administered 2022-02-24: 20 mL

## 2022-02-24 MED ORDER — BUPIVACAINE-EPINEPHRINE (PF) 0.25% -1:200000 IJ SOLN
INTRAMUSCULAR | Status: AC
  Filled 2022-02-24: qty 30

## 2022-02-24 MED ORDER — CHLORHEXIDINE GLUCONATE 0.12 % MT SOLN
OROMUCOSAL | Status: AC
  Administered 2022-02-24: 15 mL via OROMUCOSAL
  Filled 2022-02-24: qty 15

## 2022-02-24 MED ORDER — CEFAZOLIN SODIUM-DEXTROSE 2-4 GM/100ML-% IV SOLN
INTRAVENOUS | Status: AC
  Filled 2022-02-24: qty 100

## 2022-02-24 MED ORDER — TRAMADOL HCL 50 MG PO TABS: 50.0000 mg | ORAL_TABLET | Freq: Four times a day (QID) | ORAL | 1 refills | Status: DC | PRN

## 2022-02-24 MED ORDER — CHLORHEXIDINE GLUCONATE CLOTH 2 % EX PADS: 6.0000 | MEDICATED_PAD | Freq: Once | CUTANEOUS | Status: DC

## 2022-02-24 MED ORDER — LIDOCAINE 2% (20 MG/ML) 5 ML SYRINGE
INTRAMUSCULAR | Status: DC | PRN
  Administered 2022-02-24: 60 mg via INTRAVENOUS

## 2022-02-24 MED ORDER — 0.9 % SODIUM CHLORIDE (POUR BTL) OPTIME
TOPICAL | Status: DC | PRN
  Administered 2022-02-24: 1000 mL

## 2022-02-24 MED ORDER — LACTATED RINGERS IV SOLN: INTRAVENOUS | Status: DC

## 2022-02-24 MED ORDER — ACETAMINOPHEN 500 MG PO TABS
1000.0000 mg | ORAL_TABLET | ORAL | Status: AC
Start: 2022-02-25 — End: 2022-02-24

## 2022-02-24 MED ORDER — FENTANYL CITRATE (PF) 250 MCG/5ML IJ SOLN
INTRAMUSCULAR | Status: AC
  Filled 2022-02-24: qty 5

## 2022-02-24 MED ORDER — ONDANSETRON HCL 4 MG/2ML IJ SOLN
INTRAMUSCULAR | Status: AC
  Filled 2022-02-24: qty 4

## 2022-02-24 MED ORDER — FENTANYL CITRATE (PF) 100 MCG/2ML IJ SOLN
INTRAMUSCULAR | Status: DC | PRN
  Administered 2022-02-24 (×3): 50 ug via INTRAVENOUS

## 2022-02-24 MED ORDER — PROPOFOL 10 MG/ML IV BOLUS
INTRAVENOUS | Status: DC | PRN
Start: 2022-02-24 — End: 2022-02-24
  Administered 2022-02-24: 150 mg via INTRAVENOUS
  Administered 2022-02-24: 50 mg via INTRAVENOUS

## 2022-02-24 MED ORDER — PROPOFOL 10 MG/ML IV BOLUS
INTRAVENOUS | Status: AC
  Filled 2022-02-24: qty 20

## 2022-02-24 SURGICAL SUPPLY — 36 items
ADH SKN CLS APL DERMABOND .7 (GAUZE/BANDAGES/DRESSINGS) ×1
APL PRP STRL LF DISP 70% ISPRP (MISCELLANEOUS) ×1
APPLIER CLIP 9.375 MED OPEN (MISCELLANEOUS) ×2
APR CLP MED 9.3 20 MLT OPN (MISCELLANEOUS) ×1
BAG COUNTER SPONGE SURGICOUNT (BAG) ×2 IMPLANT
BAG SPNG CNTER NS LX DISP (BAG) ×1
BINDER BREAST LRG (GAUZE/BANDAGES/DRESSINGS) IMPLANT
BINDER BREAST XLRG (GAUZE/BANDAGES/DRESSINGS) IMPLANT
CANISTER SUCT 3000ML PPV (MISCELLANEOUS) ×2 IMPLANT
CHLORAPREP W/TINT 26 (MISCELLANEOUS) ×2 IMPLANT
CLIP APPLIE 9.375 MED OPEN (MISCELLANEOUS) ×1 IMPLANT
COVER PROBE W GEL 5X96 (DRAPES) ×2 IMPLANT
COVER SURGICAL LIGHT HANDLE (MISCELLANEOUS) ×3 IMPLANT
DERMABOND ADVANCED (GAUZE/BANDAGES/DRESSINGS) ×1
DERMABOND ADVANCED .7 DNX12 (GAUZE/BANDAGES/DRESSINGS) ×1 IMPLANT
DEVICE DUBIN SPECIMEN MAMMOGRA (MISCELLANEOUS) ×2 IMPLANT
DRAPE CHEST BREAST 15X10 FENES (DRAPES) ×2 IMPLANT
ELECT CAUTERY BLADE 6.4 (BLADE) ×2 IMPLANT
ELECT REM PT RETURN 9FT ADLT (ELECTROSURGICAL) ×2
ELECTRODE REM PT RTRN 9FT ADLT (ELECTROSURGICAL) ×1 IMPLANT
GLOVE SURG SIGNA 7.5 PF LTX (GLOVE) ×2 IMPLANT
GOWN STRL REUS W/ TWL LRG LVL3 (GOWN DISPOSABLE) ×1 IMPLANT
GOWN STRL REUS W/ TWL XL LVL3 (GOWN DISPOSABLE) ×1 IMPLANT
GOWN STRL REUS W/TWL LRG LVL3 (GOWN DISPOSABLE) ×2
GOWN STRL REUS W/TWL XL LVL3 (GOWN DISPOSABLE) ×2
KIT BASIN OR (CUSTOM PROCEDURE TRAY) ×2 IMPLANT
KIT MARKER MARGIN INK (KITS) ×2 IMPLANT
NDL HYPO 25GX1X1/2 BEV (NEEDLE) ×1 IMPLANT
NEEDLE HYPO 25GX1X1/2 BEV (NEEDLE) ×2 IMPLANT
NS IRRIG 1000ML POUR BTL (IV SOLUTION) IMPLANT
PACK GENERAL/GYN (CUSTOM PROCEDURE TRAY) ×2 IMPLANT
SUT MNCRL AB 4-0 PS2 18 (SUTURE) ×2 IMPLANT
SUT VIC AB 3-0 SH 18 (SUTURE) ×2 IMPLANT
SYR CONTROL 10ML LL (SYRINGE) ×2 IMPLANT
TOWEL GREEN STERILE (TOWEL DISPOSABLE) ×2 IMPLANT
TOWEL GREEN STERILE FF (TOWEL DISPOSABLE) ×2 IMPLANT

## 2022-02-24 NOTE — Anesthesia Procedure Notes (Signed)
Procedure Name: LMA Insertion ?Date/Time: 02/24/2022 4:13 PM ?Performed by: Georgia Duff, CRNA ?Pre-anesthesia Checklist: Patient identified, Emergency Drugs available, Suction available and Patient being monitored ?Patient Re-evaluated:Patient Re-evaluated prior to induction ?Oxygen Delivery Method: Circle System Utilized ?Preoxygenation: Pre-oxygenation with 100% oxygen ?Induction Type: IV induction ?Ventilation: Mask ventilation without difficulty ?LMA: LMA inserted ?LMA Size: 4.0 ?Number of attempts: 1 ?Airway Equipment and Method: Bite block ?Placement Confirmation: positive ETCO2 ?Tube secured with: Tape ?Dental Injury: Teeth and Oropharynx as per pre-operative assessment  ? ? ? ? ?

## 2022-02-24 NOTE — Transfer of Care (Signed)
Immediate Anesthesia Transfer of Care Note ? ?Patient: EVELEN VAZGUEZ ? ?Procedure(s) Performed: RIGHT BREAST LUMPECTOMY WITH RADIOACTIVE SEED LOCALIZATION (Right: Breast) ? ?Patient Location: PACU ? ?Anesthesia Type:General ? ?Level of Consciousness: patient cooperative ? ?Airway & Oxygen Therapy: Patient Spontanous Breathing ? ?Post-op Assessment: Report given to RN and Post -op Vital signs reviewed and stable ? ?Post vital signs: Reviewed and stable ? ?Last Vitals:  ?Vitals Value Taken Time  ?BP 144/71 02/24/22 1725  ?Temp    ?Pulse 65 02/24/22 1730  ?Resp 13 02/24/22 1730  ?SpO2 95 % 02/24/22 1730  ?Vitals shown include unvalidated device data. ? ?Last Pain:  ?Vitals:  ? 02/24/22 1347  ?PainSc: 0-No pain  ?   ? ?Patients Stated Pain Goal: 0 (02/24/22 1347) ? ?Complications: No notable events documented. ?

## 2022-02-24 NOTE — Interval H&P Note (Signed)
History and Physical Interval Note:no change in H and P ? ?02/24/2022 ?1:32 PM ? ?[name]  has presented today for surgery, with the diagnosis of RIGHT BREAST CANCER.  The various methods of treatment have been discussed with the patient and family. After consideration of risks, benefits and other options for treatment, the patient has consented to  Procedure(s): ?RIGHT BREAST LUMPECTOMY WITH RADIOACTIVE SEED LOCALIZATION (Right) as a surgical intervention.  The patient's history has been reviewed, patient examined, no change in status, stable for surgery.  I have reviewed the patient's chart and labs.  Questions were answered to the patient's satisfaction.   ? ? ?[name] ? ? ?

## 2022-02-24 NOTE — Discharge Instructions (Signed)
[facility] Office Phone Number [phone]  BREAST BIOPSY/ PARTIAL MASTECTOMY: POST OP INSTRUCTIONS  Always review your discharge instruction sheet given to you by the facility where your surgery was performed.  IF YOU HAVE DISABILITY OR FAMILY LEAVE FORMS, YOU MUST BRING THEM TO THE OFFICE FOR PROCESSING.  DO NOT GIVE THEM TO YOUR DOCTOR.  A prescription for pain medication may be given to you upon discharge.  Take your pain medication as prescribed, if needed.  If narcotic pain medicine is not needed, then you may take acetaminophen (Tylenol) or ibuprofen (Advil) as needed. Take your usually prescribed medications unless otherwise directed If you need a refill on your pain medication, please contact your pharmacy.  They will contact our office to request authorization.  Prescriptions will not be filled after 5pm or on week-ends. You should eat very light the first 24 hours after surgery, such as soup, crackers, pudding, etc.  Resume your normal diet the day after surgery. Most patients will experience some swelling and bruising in the breast.  Ice packs and a good support bra will help.  Swelling and bruising can take several days to resolve.  It is common to experience some constipation if taking pain medication after surgery.  Increasing fluid intake and taking a stool softener will usually help or prevent this problem from occurring.  A mild laxative (Milk of Magnesia or Miralax) should be taken according to package directions if there are no bowel movements after 48 hours. Unless discharge instructions indicate otherwise, you may remove your bandages 24-48 hours after surgery, and you may shower at that time.  You may have steri-strips (small skin tapes) in place directly over the incision.  These strips should be left on the skin for 7-10 days.  If your surgeon used skin glue on the incision, you may shower in 24 hours.  The glue will flake off over the next 2-3 weeks.  Any  sutures or staples will be removed at the office during your follow-up visit. ACTIVITIES:  You may resume regular daily activities (gradually increasing) beginning the next day.  Wearing a good support bra or sports bra minimizes pain and swelling.  You may have sexual intercourse when it is comfortable. You may drive when you no longer are taking prescription pain medication, you can comfortably wear a seatbelt, and you can safely maneuver your car and apply brakes. RETURN TO WORK:  ______________________________________________________________________________________ You should see your doctor in the office for a follow-up appointment approximately two weeks after your surgery.  Your doctor's nurse will typically make your follow-up appointment when she calls you with your pathology report.  Expect your pathology report 2-3 business days after your surgery.  You may call to check if you do not hear from us after three days. OTHER INSTRUCTIONS: OK TO SHOWER STARTING TOMORROW ICE PACK, TYLENOL, AND IBUPROFEN ALSO FOR PAIN NO VIGOROUS ACTIVITY FOR ONE WEEK _______________________________________________________________________________________________ _____________________________________________________________________________________________________________________________________ _____________________________________________________________________________________________________________________________________ _____________________________________________________________________________________________________________________________________  WHEN TO CALL YOUR DOCTOR: Fever over 101.0 Nausea and/or vomiting. Extreme swelling or bruising. Continued bleeding from incision. Increased pain, redness, or drainage from the incision.  The clinic staff is available to answer your questions during regular business hours.  Please don't hesitate to call and ask to speak to one of the nurses for clinical  concerns.  If you have a medical emergency, go to the nearest emergency room or call 911.  A surgeon from [facility] is always on call at the hospital.  For further questions, please visit   [email or web address]   

## 2022-02-24 NOTE — Anesthesia Postprocedure Evaluation (Signed)
Anesthesia Post Note ? ?Patient: LAKINA MCINTIRE ? ?Procedure(s) Performed: RIGHT BREAST LUMPECTOMY WITH RADIOACTIVE SEED LOCALIZATION (Right: Breast) ? ?  ? ?Patient location during evaluation: PACU ?Anesthesia Type: General ?Level of consciousness: awake and alert, patient cooperative and oriented ?Pain management: pain level controlled ?Vital Signs Assessment: post-procedure vital signs reviewed and stable ?Respiratory status: spontaneous breathing, nonlabored ventilation and respiratory function stable ?Cardiovascular status: blood pressure returned to baseline and stable ?Postop Assessment: no apparent nausea or vomiting and able to ambulate ?Anesthetic complications: no ? ? ?No notable events documented. ? ?Last Vitals:  ?Vitals:  ? 02/24/22 1740 02/24/22 1755  ?BP: (!) 144/63 (!) 167/78  ?Pulse: 67 65  ?Resp: 17 14  ?Temp:  36.6 ?C  ?SpO2: 97% 97%  ?  ?Last Pain:  ?Vitals:  ? 02/24/22 1755  ?PainSc: 0-No pain  ? ? ?  ?  ?  ?  ?  ?  ? ?Raena Pau,E. Dung Salinger ? ? ? ? ?

## 2022-02-24 NOTE — Op Note (Signed)
Operative Note ?  ?Pre-op Diagnosis: right breast cancer ?    ?Post-op Diagnosis: same ?  ?Procedure(s): ?radioactive seed localized lumpectomy  ?  ?Surgeon(s): ?[name], MD ?  ?Assistant: Cameron Sprang, MD [facility] Resident ?  ?Anesthesia: GEN - LMA/ PEC block ?  ?  ?Description of procedure:  ?The patient is brought to the operating room placed in supine position on the operating room table. After an adequate level of general anesthesia was obtained, her right breast was prepped with ChloraPrep and draped in sterile fashion. A timeout was taken to ensure the proper patient and proper procedure. We interrogated the breast with the neoprobe. We made a right upper outer quadrant curvilinear incision over the site of the neoprobe signal after infiltration with Marcaine. Dissection was carried down in the breast tissue with cautery. We used the neoprobe to guide [place] towards the radioactive seed. The seed was briefly visualized within the specimen, and additional medial margin was included in the biopsy specimen to ensure complete excision of the cancer. We excised an area of tissue around the radioactive seed 2 cm in diameter.  The specimen was removed and was oriented with a paint kit. Specimen mammogram showed the clip and the radioactive seed. The specimen was sent for pathologic examination. There was no residual radioactivity within the biopsy cavity. Clips were placed in all margins.  We inspected carefully for hemostasis. The wound was thoroughly irrigated.  ?  ?The wound was closed with a deep layer of 3-0 Vicryl and a subcuticular layer of 4-0 Monocryl.  Dermabond was applied. The patient was then extubated and brought to the recovery room in stable condition. All sponge, instrument, and needle counts are correct. ?  ?  ?Estimated Blood Loss: less than 50 mL ?         ?Complications: None; patient tolerated the procedure well. ?        ?Disposition: PACU - hemodynamically stable. ?        ?Condition:  stable ?

## 2022-02-25 ENCOUNTER — Encounter (HOSPITAL_COMMUNITY): Payer: Self-pay | Admitting: Surgery

## 2022-02-25 DIAGNOSIS — C50911 Malignant neoplasm of unspecified site of right female breast: Secondary | ICD-10-CM | POA: Diagnosis not present

## 2022-02-26 NOTE — Progress Notes (Signed)
?Subjective:  ?Patient ID: [name], female    DOB: 1946-05-01,  MRN: [id number] ? ?[name] presents to clinic today for at risk foot care with history of diabetic neuropathy, corn(s) b/l lower extremities, preulcerative lesion(s) R hallux, callus(es) left lower extremity. Pain interferes with ambulation. Painful corns and calluses are aggravated when weightbearing with and without shoegear. Pain is relieved with periodic professional debridement.  She is also seen for f/u of painful mycotic toenails of both feet which are relieved with periodic professional debridement. ? ?New problem(s): None.  ? ?PCP is Unk Pinto, MD , and last visit was January 27, 2022. ? ?Allergies  ?Allergen Reactions  ? Procaine Hcl Shortness Of Breath  ?  Ok to use sq lidocaine for iv starts  ? Ciprofloxacin   ?  Unknown reaction  ? Macrobid [Nitrofurantoin Macrocrystal]   ?  Unknown reaction  ? Sulfonamide Derivatives Hives  ? Tetanus Toxoid Swelling  ?  REACTION: arm swells  ? Tuberculin Ppd Swelling  ? Penicillins Rash  ? ? ?Review of Systems: Negative except as noted in the HPI. ? ?Objective: No changes noted in today's physical examination. ?Constitutional [name] is a pleasant 76 y.o. Caucasian female, morbidly obese in NAD. AAO x 3.   ?Vascular CFT <3 seconds b/l LE. Palpable DP pulse(s) b/l LE. Palpable PT pulse(s) b/l LE. No pain with calf compression b/l. Lower extremity skin temperature gradient within normal limits. +1 pitting edema noted BLE. Evidence of chronic venous insufficiency b/l LE. No ischemia or gangrene noted b/l LE. No cyanosis or clubbing noted b/l LE.  ?Neurologic Normal speech. Oriented to person, place, and time. Pt has subjective symptoms of neuropathy. Protective sensation decreased with 10 gram monofilament b/l.  ?Dermatologic No open wounds b/l LE. No interdigital macerations noted b/l LE. Toenails 1-5 b/l elongated, discolored, dystrophic, thickened, crumbly with subungual debris and  tenderness to dorsal palpation. Hyperkeratotic lesion(s) bilateral 2nd toes, L hallux, and submet head 5 left foot.  No erythema, no edema, no drainage, no fluctuance. Preulcerative lesion resolved R hallux and is now hyperkeratotic. She has developed preulcerative lesions of the distal tips of bilateral 3rd digits. There is visible subdermal hemorrhage. There is no surrounding erythema, no edema, no drainage, no odor, no fluctuance. Postdebridement reveals partial thickness ulceration of left 3rd digit with no penetration into deep tissues  ?Orthopedic: HAV with bunion deformity noted b/l LE. Severely contracted hammertoe deformity noted 1-5 bilaterally.  ? ?Radiographs: None ? ?  Latest Ref Rng & Units 01/27/2022  ?  3:13 PM 10/12/2021  ?  2:34 PM 05/06/2021  ?  2:20 PM  ?Hemoglobin A1C  ?Hemoglobin-A1c <5.7 % of total Hgb 6.0   5.9   6.0    ? ?Assessment/Plan: ?1. Pain due to onychomycosis of toenails of both feet   ?2. Corns and callosities   ?3. Pre-ulcerative calluses   ?4. Pressure injury of toe of left foot, stage 2 ([place])   ?5. Diabetic peripheral neuropathy associated with type 2 diabetes mellitus ([place])   ? ?-Examined patient. ?-Partial thickness ulcer left 3rd digit pared with sharp debridement Cleansed with alcohol.. Triple antibiotic ointment and band-aid applied. She is to apply Neosporin once daily for one week. Call office if she develops any problems. ?-Mycotic toenails 1-5 bilaterally were debrided in length and girth with sterile nail nippers and dremel without incident. ?-Corn(s) bilateral great toes and bilateral 2nd toes and callus(es) submet head 5 left foot were pared utilizing sterile scalpel  blade without incident. Total number debrided =5. ?-Preulcerative lesion pared R 3rd toe. Total number pared=1. ?-Patient/POA to call should there be question/concern in the interim.  ? ?Return in about 3 months (around 05/23/2022). ? ?Marzetta Board, DPM  ?

## 2022-02-28 LAB — SURGICAL PATHOLOGY

## 2022-03-01 NOTE — Progress Notes (Signed)
? ?Patient Care Team: ?Unk Pinto, MD as PCP - General (Internal Medicine) ?Buford Dresser, MD as PCP - Cardiology (Cardiology) ?Macarthur Critchley, OD as Referring Physician (Optometry) ?Delfina Redwood as Referring Physician (Physician Assistant) ?Mauro Kaufmann, RN as Oncology Nurse Navigator ?Rockwell Germany, RN as Oncology Nurse Navigator ?[name], MD as Consulting Physician (General Surgery) ?Nicholas Lose, MD as Consulting Physician (Hematology and Oncology) ?Eppie Gibson, MD as Attending Physician (Radiation Oncology) ? ?DIAGNOSIS:  ?Encounter Diagnosis  ?Name Primary?  ? Malignant neoplasm of upper-outer quadrant of right breast in female, estrogen receptor positive ([place])   ? ? ?SUMMARY OF ONCOLOGIC HISTORY: ?Oncology History  ?Malignant neoplasm of upper-outer quadrant of right breast in female, estrogen receptor positive ([place])  ?02/01/2022 Initial Diagnosis  ? Screening mammogram detected 0.6 cm right breast mass.  By ultrasound it measured 0.4 cm.  Stereotactic biopsy revealed grade 2 IDC and intermediate grade DCIS ER 100%, PR 100%, HER2 negative, Ki-67 10% ?  ?02/02/2022 Cancer Staging  ? Staging form: Breast, AJCC 8th Edition ?- Clinical: Stage IA (cT1b, cN0, cM0, G2, ER+, PR+, HER2-) - Signed by Nicholas Lose, MD on 02/02/2022 ?Stage prefix: Initial diagnosis ?Histologic grading system: 3 grade system ? ?  ? ? ?CHIEF COMPLIANT: discuss final pathology report Follow-up left lumpectomy  ? ?INTERVAL HISTORY: [name] is a49 y.o. female is here because of recent diagnosis of right  breast Invasive moderately differentiated  Ductal Carcinoma Intermediate.  She underwent a recent lumpectomy which showed grade 2 IDC 8 mm with DCIS margins negative.  She presents to the clinic today for a follow-up to discuss adjuvant treatment plan. ? ?ALLERGIES:  is allergic to procaine hcl, ciprofloxacin, macrobid [nitrofurantoin macrocrystal], sulfonamide derivatives, tetanus toxoid,  tuberculin ppd, and penicillins. ? ?MEDICATIONS:  ?Current Outpatient Medications  ?Medication Sig Dispense Refill  ? acetaminophen (TYLENOL) 650 MG CR tablet Take 650 mg by mouth every 8 (eight) hours as needed for pain.    ? aspirin 81 MG tablet Take 81 mg by mouth daily.    ? atorvastatin (LIPITOR) 40 MG tablet Take 1 tablet Daily  for Cholesterol 90 tablet 3  ? cholecalciferol (VITAMIN D3) 25 MCG (1000 UNIT) tablet Take 3,000 Units by mouth daily.    ? cyanocobalamin 1000 MCG tablet Take 1,000 mcg by mouth daily.    ? diclofenac Sodium (VOLTAREN) 1 % GEL Apply 4 g topically 4 (four) times daily. (Patient taking differently: Apply 4 g topically 4 (four) times daily as needed (pain).) 4 g 2  ? dicyclomine (BENTYL) 20 MG tablet Take  1 tablet  3 x /day  if needed for Nausea, Bloating, Cramping or Diarrhea 270 tablet 1  ? dorzolamide-timolol (COSOPT) 22.3-6.8 MG/ML ophthalmic solution Place 1 drop into both eyes 2 (two) times daily.  1  ? ezetimibe (ZETIA) 10 MG tablet Take  1 tablet  Daily  for Cholesterol 90 tablet 3  ? famotidine (PEPCID) 40 MG tablet Take 40 mg by mouth daily.    ? fluconazole (DIFLUCAN) 150 MG tablet Take 1 tablet 2 x /week  if needed for yeast infection 8 tablet 3  ? furosemide (LASIX) 20 MG tablet Take 1 tablet 2 x /day for BP & Fluid Retention / Patient knows to take by mouth (Patient taking differently: Take 20 mg by mouth 2 (two) times daily as needed for fluid.) 180 tablet 3  ? gabapentin (NEURONTIN) 800 MG tablet Take  1 capsule  3 x /day  for Chronic Pain  270 tablet 3  ? letrozole (FEMARA) 2.5 MG tablet Take 1 tablet (2.5 mg total) by mouth daily. 90 tablet 3  ? levothyroxine (SYNTHROID) 75 MCG tablet Take 1 to 1.5 tablet daily as directed on an empty stomach with only water for 30 minutes & No Antacid meds, Calcium or Magnesium for 4 hrs (Patient taking differently: Take 75-112.5 mcg by mouth See admin instructions. Take 75 mcg by mouth 3 times weekly and take 112.5 mcg 4 times weekly)  114 tablet 3  ? Multiple Vitamin (MULTIVITAMIN) tablet Take 1 tablet by mouth daily.    ? ondansetron (ZOFRAN) 4 MG tablet Take  1 tablet  3 x /day  if needed for nausea & Vomiting 60 tablet 1  ? OVER THE COUNTER MEDICATION Apply 1 application. topically daily as needed (pain). Stop Pain    ? OVER THE COUNTER MEDICATION Take 1 capsule by mouth daily. Tru Niagen    ? pantoprazole (PROTONIX) 40 MG tablet Take  1 tablet  Daily  to Prevent Heartburn & Indigestion 90 tablet 3  ? Probiotic Product (PROBIOTIC DAILY PO) Take 1 capsule by mouth daily.    ? pseudoephedrine (SUDAFED) 120 MG 12 hr tablet Take   1 tablet    2 x /day (every 12 hours)    for Sinus & Chest Congestion 60 tablet 2  ? traMADol (ULTRAM) 50 MG tablet Take 1-2 tablets (50-100 mg total) by mouth every 6 (six) hours as needed. 30 tablet 1  ? ?No current facility-administered medications for this visit.  ? ? ?PHYSICAL EXAMINATION: ?ECOG PERFORMANCE STATUS: 1 - Symptomatic but completely ambulatory ? ?Vitals:  ? 03/15/22 1449  ?BP: (!) 134/57  ?Pulse: 63  ?Resp: 18  ?Temp: 97.9 ?F (36.6 ?C)  ?SpO2: 97%  ? ?Filed Weights  ? 03/15/22 1449  ?Weight: 217 lb 8 oz (98.7 kg)  ? ?  ? ?LABORATORY DATA:  ?I have reviewed the data as listed ? ?  Latest Ref Rng & Units 02/02/2022  ? 11:54 AM 01/27/2022  ?  3:13 PM 10/12/2021  ?  2:34 PM  ?CMP  ?Glucose 70 - 99 mg/dL 129   80   85    ?BUN 8 - 23 mg/dL '17   16   14    ' ?Creatinine 0.44 - 1.00 mg/dL 1.04   0.99   1.17    ?Sodium 135 - 145 mmol/L 142   144   144    ?Potassium 3.5 - 5.1 mmol/L 3.5   4.4   4.5    ?Chloride 98 - 111 mmol/L 108   106   105    ?CO2 22 - 32 mmol/L '28   27   29    ' ?Calcium 8.9 - 10.3 mg/dL 9.4   9.5   9.5    ?Total Protein 6.5 - 8.1 g/dL 7.2   6.5   6.6    ?Total Bilirubin 0.3 - 1.2 mg/dL 0.7   0.7   0.7    ?Alkaline Phos 38 - 126 U/L 97      ?AST 15 - 41 U/L '15   16   17    ' ?ALT 0 - 44 U/L '15   15   14    ' ? ? ?Lab Results  ?Component Value Date  ? WBC 9.1 02/02/2022  ? HGB 14.0 02/02/2022  ? HCT 42.1  02/02/2022  ? MCV 88.3 02/02/2022  ? PLT 254 02/02/2022  ? NEUTROABS 5.5 02/02/2022  ? ? ?ASSESSMENT &  PLAN:  ?Malignant neoplasm of upper-outer quadrant of right breast in female, estrogen receptor positive ([place]) ?02/24/2022: Right lumpectomy: Grade 2 IDC 8 mm (2 contiguous blocks), intermediate grade DCIS, margins negative, ER 100%, PR 100%, HER2 negative, Ki-67 10% ? ?Pathology counseling: I discussed the final pathology report of the patient provided  a copy of this report. I discussed the margins as well as lymph node surgeries. We also discussed the final staging along with previously performed ER/PR and HER-2/neu testing. ? ?Treatment plan: ?1.  Given the small size and good prognostic profile, we are not doing Oncotype DX. ?2.  +/-Adjuvant radiation therapy (patient has appointment with Dr. Isidore Moos but she does not want to do radiation) ?3. Adjuvant antiestrogen therapy: Letrozole prescription has been sent ? ?Letrozole counseling: We discussed the risks and benefits of anti-estrogen therapy with aromatase inhibitors. These include but not limited to insomnia, hot flashes, mood changes, vaginal dryness, bone density loss, and weight gain. We strongly believe that the benefits far outweigh the risks. Patient understands these risks and consented to starting treatment. Planned treatment duration is 5 years. ? ?Return to clinic in 3 months for survivorship care plan visit ? ? ? ?No orders of the defined types were placed in this encounter. ? ?The patient has a good understanding of the overall plan. she agrees with it. she will call with any problems that may develop before the next visit here. ?Total time spent: 30 mins including face to face time and time spent for planning, charting and co-ordination of care ? ? Harriette Ohara, MD ?03/15/22 ? ? ? I Gardiner Coins am scribing for Dr. Lindi Adie ? ?I have reviewed the above documentation for accuracy and completeness, and I agree with the above. ?  ?

## 2022-03-02 ENCOUNTER — Encounter: Payer: Self-pay | Admitting: *Deleted

## 2022-03-07 ENCOUNTER — Encounter: Payer: Self-pay | Admitting: *Deleted

## 2022-03-10 ENCOUNTER — Encounter (HOSPITAL_COMMUNITY): Payer: Self-pay

## 2022-03-15 ENCOUNTER — Inpatient Hospital Stay: Payer: PPO | Attending: Hematology and Oncology | Admitting: Hematology and Oncology

## 2022-03-15 ENCOUNTER — Encounter: Payer: Self-pay | Admitting: Internal Medicine

## 2022-03-15 ENCOUNTER — Other Ambulatory Visit: Payer: Self-pay

## 2022-03-15 DIAGNOSIS — Z170 Estrogen receptor positive status [ER+]: Secondary | ICD-10-CM | POA: Diagnosis not present

## 2022-03-15 DIAGNOSIS — Z79899 Other long term (current) drug therapy: Secondary | ICD-10-CM | POA: Insufficient documentation

## 2022-03-15 DIAGNOSIS — C50411 Malignant neoplasm of upper-outer quadrant of right female breast: Secondary | ICD-10-CM

## 2022-03-15 DIAGNOSIS — Z7982 Long term (current) use of aspirin: Secondary | ICD-10-CM | POA: Diagnosis not present

## 2022-03-15 MED ORDER — LETROZOLE 2.5 MG PO TABS
2.5000 mg | ORAL_TABLET | Freq: Every day | ORAL | 3 refills | Status: DC
Start: 2022-03-15 — End: 2022-06-13

## 2022-03-15 NOTE — Assessment & Plan Note (Signed)
02/24/2022: Right lumpectomy: Grade 2 IDC 8 mm (2 contiguous blocks), intermediate grade DCIS, margins negative, ER 100%, PR 100%, HER2 negative, Ki-67 10% ? ?Pathology counseling: I discussed the final pathology report of the patient provided  a copy of this report. I discussed the margins as well as lymph node surgeries. We also discussed the final staging along with previously performed ER/PR and HER-2/neu testing. ? ?Treatment plan: ?1.  Given the small size and good prognostic profile, we are not doing Oncotype DX. ?2.  +/-Adjuvant radiation therapy followed by ?3. Adjuvant antiestrogen therapy ? ?Letrozole counseling: We discussed the risks and benefits of anti-estrogen therapy with aromatase inhibitors. These include but not limited to insomnia, hot flashes, mood changes, vaginal dryness, bone density loss, and weight gain. We strongly believe that the benefits far outweigh the risks. Patient understands these risks and consented to starting treatment. Planned treatment duration is 5 years. ? ?Return to clinic in 3 months for survivorship care plan visit ? ?

## 2022-03-22 ENCOUNTER — Ambulatory Visit: Payer: PPO | Admitting: Radiation Oncology

## 2022-03-22 ENCOUNTER — Ambulatory Visit: Payer: PPO

## 2022-03-24 DIAGNOSIS — Z780 Asymptomatic menopausal state: Secondary | ICD-10-CM | POA: Diagnosis not present

## 2022-03-24 LAB — HM DEXA SCAN: HM Dexa Scan: NORMAL

## 2022-03-29 ENCOUNTER — Encounter: Payer: Self-pay | Admitting: *Deleted

## 2022-03-29 DIAGNOSIS — Z170 Estrogen receptor positive status [ER+]: Secondary | ICD-10-CM

## 2022-03-30 ENCOUNTER — Encounter: Payer: Self-pay | Admitting: Internal Medicine

## 2022-04-07 DIAGNOSIS — C50911 Malignant neoplasm of unspecified site of right female breast: Secondary | ICD-10-CM | POA: Diagnosis not present

## 2022-04-25 ENCOUNTER — Ambulatory Visit: Payer: PPO | Admitting: Podiatry

## 2022-04-25 ENCOUNTER — Encounter: Payer: Self-pay | Admitting: Podiatry

## 2022-04-25 DIAGNOSIS — E1142 Type 2 diabetes mellitus with diabetic polyneuropathy: Secondary | ICD-10-CM | POA: Diagnosis not present

## 2022-04-25 DIAGNOSIS — B351 Tinea unguium: Secondary | ICD-10-CM

## 2022-04-25 DIAGNOSIS — M79674 Pain in right toe(s): Secondary | ICD-10-CM | POA: Diagnosis not present

## 2022-04-25 DIAGNOSIS — M79675 Pain in left toe(s): Secondary | ICD-10-CM | POA: Diagnosis not present

## 2022-04-25 DIAGNOSIS — L84 Corns and callosities: Secondary | ICD-10-CM | POA: Diagnosis not present

## 2022-04-27 ENCOUNTER — Other Ambulatory Visit: Payer: Self-pay | Admitting: Internal Medicine

## 2022-05-01 NOTE — Progress Notes (Signed)
Subjective:  Patient ID: [name], female    DOB: 1946/01/16,  MRN: [id number]  [name] presents to clinic today for at risk foot care with history of diabetic neuropathy, corn(s) b/l lower extremities, callus(es) b/l lower extremities, preulcerative lesion(s) bilateral 3rd toes and painful mycotic toenails that limit ambulation. Painful toenails interfere with ambulation. Aggravating factors include wearing enclosed shoe gear. Pain is relieved with periodic professional debridement. Painful corns, calluses and preulcerative lesions are aggravated when weightbearing with and without shoegear. Pain is relieved with periodic professional debridement.  New problem(s): None.   Patient states she did receive a letter approving her for more visits.  PCP is Unk Pinto, MD , and last visit was March, 2023.  Allergies  Allergen Reactions   Procaine Hcl Shortness Of Breath    Ok to use sq lidocaine for iv starts   Ciprofloxacin     Unknown reaction   Macrobid [Nitrofurantoin Macrocrystal]     Unknown reaction   Sulfonamide Derivatives Hives   Tetanus Toxoid Swelling    REACTION: arm swells   Tuberculin Ppd Swelling   Penicillins Rash    Review of Systems: Negative except as noted in the HPI.  Objective: No changes noted in today's physical examination. Constitutional ALTHEA BACKS is a pleasant 76 y.o. Caucasian female, morbidly obese in NAD. AAO x 3.   Vascular CFT <3 seconds b/l LE. Palpable DP pulse(s) b/l LE. Palpable PT pulse(s) b/l LE. No pain with calf compression b/l. Lower extremity skin temperature gradient within normal limits. +1 pitting edema noted BLE. Evidence of chronic venous insufficiency b/l LE. No ischemia or gangrene noted b/l LE. No cyanosis or clubbing noted b/l LE.  Neurologic Normal speech. Oriented to person, place, and time. Pt has subjective symptoms of neuropathy. Protective sensation decreased with 10 gram monofilament b/l.  Dermatologic No  open wounds b/l LE. No interdigital macerations noted b/l LE. Toenails 1-5 b/l elongated, discolored, dystrophic, thickened, crumbly with subungual debris and tenderness to dorsal palpation. Hyperkeratotic lesion(s) bilateral 2nd toes, b/l hallux, and submet head 5 left foot.  No erythema, no edema, no drainage, no fluctuance. Preulcerative lesions of the distal tips of bilateral 3rd digits. There is visible subdermal hemorrhage. There is no surrounding erythema, no edema, no drainage, no odor, no fluctuance.   Orthopedic: HAV with bunion deformity noted b/l LE. Severely contracted hammertoe deformity noted 1-5 bilaterally.   Radiographs: None     Latest Ref Rng & Units 01/27/2022    3:13 PM 10/12/2021    2:34 PM 05/06/2021    2:20 PM  Hemoglobin A1C  Hemoglobin-A1c <5.7 % of total Hgb 6.0  5.9  6.0    Assessment/Plan: 1. Pain due to onychomycosis of toenails of both feet   2. Corns and callosities   3. Pre-ulcerative corn or callous   4. Diabetic peripheral neuropathy associated with type 2 diabetes mellitus ([place])     -Patient was evaluated and treated. All patient's and/or POA's questions/concerns answered on today's visit. -Discussed frequency of visits with patient. If she can be seen sooner than 9 weeks, we may be able to resolve her preulcerative lesions. She will contact her insurance company to see if her preauthorization includes unlimited visits within that time frame. -Patient to continue soft, supportive shoe gear daily. -Toenails 1-5 b/l were debrided in length and girth with sterile nail nippers and dremel without iatrogenic bleeding.  -Corn(s) bilateral 2nd toes and callus(es) left great toe, right great toe, and submet head  5 left foot were pared utilizing sterile scalpel blade without incident. Total number debrided =5. -Preulcerative lesion pared bilateral 3rd toes. Total number pared=2. -Patient/POA to call should there be question/concern in the interim.   Return in about  9 weeks (around 06/27/2022).  Marzetta Board, DPM

## 2022-05-03 ENCOUNTER — Ambulatory Visit (INDEPENDENT_AMBULATORY_CARE_PROVIDER_SITE_OTHER): Payer: PPO | Admitting: Internal Medicine

## 2022-05-03 ENCOUNTER — Encounter: Payer: Self-pay | Admitting: Internal Medicine

## 2022-05-03 VITALS — BP 126/72 | HR 76 | Temp 97.9°F | Resp 16 | Ht 67.0 in | Wt 221.6 lb

## 2022-05-03 DIAGNOSIS — N182 Chronic kidney disease, stage 2 (mild): Secondary | ICD-10-CM | POA: Diagnosis not present

## 2022-05-03 DIAGNOSIS — E1140 Type 2 diabetes mellitus with diabetic neuropathy, unspecified: Secondary | ICD-10-CM

## 2022-05-03 DIAGNOSIS — R232 Flushing: Secondary | ICD-10-CM

## 2022-05-03 DIAGNOSIS — Z79899 Other long term (current) drug therapy: Secondary | ICD-10-CM

## 2022-05-03 DIAGNOSIS — I700 Atherosclerosis of aorta: Secondary | ICD-10-CM | POA: Diagnosis not present

## 2022-05-03 DIAGNOSIS — E559 Vitamin D deficiency, unspecified: Secondary | ICD-10-CM

## 2022-05-03 DIAGNOSIS — E785 Hyperlipidemia, unspecified: Secondary | ICD-10-CM | POA: Diagnosis not present

## 2022-05-03 DIAGNOSIS — E1169 Type 2 diabetes mellitus with other specified complication: Secondary | ICD-10-CM | POA: Diagnosis not present

## 2022-05-03 DIAGNOSIS — E1122 Type 2 diabetes mellitus with diabetic chronic kidney disease: Secondary | ICD-10-CM | POA: Diagnosis not present

## 2022-05-03 DIAGNOSIS — I10 Essential (primary) hypertension: Secondary | ICD-10-CM | POA: Diagnosis not present

## 2022-05-03 DIAGNOSIS — E039 Hypothyroidism, unspecified: Secondary | ICD-10-CM | POA: Diagnosis not present

## 2022-05-03 MED ORDER — FUROSEMIDE 40 MG PO TABS
40.0000 mg | ORAL_TABLET | Freq: Two times a day (BID) | ORAL | 2 refills | Status: DC
  Filled 2022-11-03: qty 180, 90d supply, fill #0
  Filled 2023-04-25: qty 180, 90d supply, fill #1

## 2022-05-03 MED ORDER — GABAPENTIN 800 MG PO TABS: ORAL_TABLET | ORAL | 3 refills | Status: DC

## 2022-05-03 NOTE — Progress Notes (Addendum)
Future Appointments  Date Time Provider Department  05/03/2022            3 mo OV  2:30 PM Unk Pinto, MD [facility]  06/13/2022  1:45 PM Gardenia Phlegm, NP [facility]  06/27/2022  1:30 PM Marzetta Board, DPM [facility]  08/09/2022           6 mo OV  2:30 PM Alycia Rossetti, NP [facility]  08/31/2022  1:15 PM Marzetta Board, DPM [facility]  11/08/2022          CPE  2:00 PM Alycia Rossetti, NP [facility]  02/09/2023           Wellness  2:30 PM Alycia Rossetti, NP [facility]     History of Present Illness:      This very nice 76 y.o. WWF presents for 3 month follow up with labile HTN, HLD, diet controlled T2_DM , Hypothyroidism, and Vitamin D Deficiency. CT scan in 2019 showed Aortic Atherosclerosis.       Patient recently underwent Rt Breast Lumpectomy 02/24/22 by Dr Ninfa Linden for a Stage Ia Breast cancer and is followed by Dr Lindi Adie recommending Letrozole x 5 years . Despite already on moderate dosing of Gabapentin for Neuropathy, she has begun having Night Sweats & Flashes.        Patient is treated for HTN & BP has been controlled at home. Today's BP is  126/72.   She had a negative Cardiac CT in Dec 2020. Patient has had no complaints of any cardiac type chest pain, palpitations, dyspnea / orthopnea / PND, dizziness, claudication, but does c/o worsening dependent edema.       Hyperlipidemia is controlled with diet & Atorvastatin.  Patient denies myalgias or other med SE's. Last Lipids were at goal:  Lab Results  Component Value Date   CHOL 122 01/27/2022   HDL 43 (L) 01/27/2022   LDLCALC 52 01/27/2022   TRIG 196 (H) 01/27/2022   CHOLHDL 2.8 01/27/2022    Also, the patient has Morbid Obesity (BMI 34.07)  and history of T2_NIDDM managed with diet and has had no symptoms of reactive hypoglycemia, diabetic polys, paresthesias or visual blurring.  Last A1c was not at goal:  Lab Results  Component Value Date   HGBA1C 6.0 (H) 01/27/2022                                                     Patient has been on Thyroid Replacement circa 1996.   Further, the patient also has history of Vitamin D Deficiency ("40" on therapy in 2019) and supplements vitamin D without any suspected side-effects. Last vitamin D was near goal (70-100):   Lab Results  Component Value Date   VD25OH 77 10/12/2021       Current Outpatient Medications on File Prior to Visit  Medication Sig   TYLENOL ARTHRITIS  Take as needed   aspirin 81 MG tablet Take daily.   atorvastatin  40 MG tablet Take 1 tablet Daily  for Cholesterol   b complex vitamins tablet Take 1 tablet  daily.   diclofenac 1 % GEL Apply 4 g topically 4 times daily.   COSOPT ophth  soln USE 1 DROP  2 TIMES DAILY   ezetimibe 10 MG tablet Take 1 tablet daily.   famotidine 40 MG tablet  Take 1 tablet at bedtime   furosemide 20 MG tablet Take 1 tablet 2 x /day for BP & Fluid Retention   gabapentin 800 MG tablet Take  1 capsule  3 x /day  for Chronic Pain   levothyroxine 75 MCG tablet Take 1 to 1.5 tablet daily as directed    Multiple Vitamin  Take 1 tablet daily.   pantoprazole  40 MG tablet Take  1 tablet  Daily  to Prevent Heartburn & Indigestion   promethazine-DM 6.25-15 MG/5ML syrup Take 5 mLs every 4  hours.   dicyclomine  20 MG tablet Take 1 tablet   3  times daily as needed for spasms.   NITROSTAT 0.4 MG SL tablet as needed for chest pain.    Allergies  Allergen Reactions   Ciprofloxacin    Macrobid [Nitrofurantoin Macrocrystal]    Penicillins     REACTION: rash   Procaine Hcl     REACTION: difficulty breathing   Sulfonamide Derivatives     REACTION: hives   Tetanus Toxoid     REACTION: arm swells   Tuberculin Ppd Swelling     PMHx:   Past Medical History:  Diagnosis Date   Anemia    Chest tightness    CKD stage 3 secondary to diabetes ([place]) 10/09/2019   Fatigue    GERD (gastroesophageal reflux disease)    Heart palpitations 12/06/2019   Hyperlipidemia    Hypothyroid    Internal  hemorrhoids 05/04/2009   Myalgia    Obesity    Prediabetes    Ulcer    Varicose veins      Immunization History  Administered Date(s) Administered   Influenza, High Dose  08/10/2017, 08/14/2018, 08/27/2019, 09/10/2020   Pneumococcal -13 05/12/2014   Pneumococcal -23 09/05/2007, 06/02/2015     Past Surgical History:  Procedure Laterality Date   ABDOMINAL HYSTERECTOMY     Total    CATARACT EXTRACTION Bilateral    Dr. Tommy Rainwater   CYST REMOVAL HAND Left 07/30/2018   NASAL SINUS SURGERY     ROTATOR CUFF REPAIR Left more than 7 years ago   Dr. Percell Miller and Dr. Earleen Newport   TONSILLECTOMY AND ADENOIDECTOMY      FHx:    Reviewed / unchanged  SHx:    Reviewed / unchanged   Systems Review:  Constitutional: Denies fever, chills, wt changes, headaches, insomnia, fatigue, night sweats, change in appetite. Eyes: Denies redness, blurred vision, diplopia, discharge, itchy, watery eyes.  ENT: Denies discharge, congestion, post nasal drip, epistaxis, sore throat, earache, hearing loss, dental pain, tinnitus, vertigo, sinus pain, snoring.  CV: Denies chest pain, palpitations, irregular heartbeat, syncope, dyspnea, diaphoresis, orthopnea, PND, claudication or edema. Respiratory: denies cough, dyspnea, DOE, pleurisy, hoarseness, laryngitis, wheezing.  Gastrointestinal: Denies dysphagia, odynophagia, heartburn, reflux, water brash, abdominal pain or cramps, nausea, vomiting, bloating, diarrhea, constipation, hematemesis, melena, hematochezia  or hemorrhoids. Genitourinary: Denies dysuria, frequency, urgency, nocturia, hesitancy, discharge, hematuria or flank pain. Musculoskeletal: Denies arthralgias, myalgias, stiffness, jt. swelling, pain, limping or strain/sprain.  Skin: Denies pruritus, rash, hives, warts, acne, eczema or change in skin lesion(s). Neuro: No weakness, tremor, incoordination, spasms, paresthesia or pain. Psychiatric: Denies confusion, memory loss or sensory loss. Endo: Denies change  in weight, skin or hair change.  Heme/Lymph: No excessive bleeding, bruising or enlarged lymph nodes.  Physical Exam  BP 126/72   Pulse 76   Temp 97.9 F (36.6 C)   Resp 16   Ht '5\' 7"'$  (1.702 m)   Wt 221 lb 9.6  oz (100.5 kg)   SpO2 96%   BMI 34.71 kg/m   Appears  well nourished, well groomed  and in no distress.  Eyes: PERRLA, EOMs, conjunctiva no swelling or erythema. Sinuses: No frontal/maxillary tenderness ENT/Mouth: EAC's clear, TM's nl w/o erythema, bulging. Nares clear w/o erythema, swelling, exudates. Oropharynx clear without erythema or exudates. Oral hygiene is good. Tongue normal, non obstructing. Hearing intact.  Neck: Supple. Thyroid not palpable. Car 2+/2+ without bruits, nodes or JVD. Chest: Respirations nl with BS clear & equal w/o rales, rhonchi, wheezing or stridor.  Cor: Heart sounds normal w/ regular rate and rhythm without sig. murmurs, gallops, clicks or rubs. Peripheral pulses normal and equal  with 1-2 + pretibial edema Lt>Rt. No calf tenderness or homans' sign.  Abdomen: Soft & bowel sounds normal. Non-tender w/o guarding, rebound, hernias, masses or organomegaly.  Lymphatics: Unremarkable.  Musculoskeletal: Full ROM all peripheral extremities, joint stability, 5/5 strength and normal gait.  Skin: Warm, dry without exposed rashes, lesions or ecchymosis apparent.  Neuro: Cranial nerves intact, reflexes equal bilaterally. Sensory-motor testing grossly intact. Tendon reflexes grossly intact.  Pysch: Alert & oriented x 3.  Insight and judgement nl & appropriate. No ideations.  Assessment and Plan:  1. Essential hypertension  - CBC with Differential/Platelet - COMPLETE METABOLIC PANEL WITH GFR - Magnesium - TSH  - Increase Lasix 20 mg up to 40 mg 1 -2 x/day for her worsening edema.   2. Hyperlipidemia associated with type 2 diabetes mellitus ([place])  - Lipid panel - TSH  3. Type 2 diabetes mellitus with stage 2 chronic kidney  disease, without long-term  current use of insulin ([facility])  - Hemoglobin A1c - Insulin, random  4. Vitamin D deficiency  - VITAMIN D 25 Hydroxy   5. Morbid obesity ([facility])  - TSH  6. Hypothyroidism  - TSH  7. Aortic atherosclerosis ([facility])  - Lipid panel  8. Medication management  - CBC with Differential/Platelet - COMPLETE METABOLIC PANEL WITH GFR - Magnesium - Lipid panel - TSH - Hemoglobin A1c - Insulin, random - VITAMIN D 25 Hydroxy       Discussed  regular exercise, BP monitoring, weight control to achieve/maintain BMI less than 25 and discussed med and SE's. Recommended labs to assess and monitor clinical status with further disposition pending results of labs.  Discussed Tx of Pain/Hot Flashes- Night Sweats to try increase her Gabapentin 800 mg - 3 tabs up to 4-5 tabs /day as tolerated .  I discussed the assessment and treatment plan with the patient. The patient was provided an opportunity to ask questions and all were answered. The patient agreed with the plan and demonstrated an understanding of the instructions.  I provided over 40 minutes of exam, counseling, chart review and  complex critical decision making.      The patient was advised to call back or seek an in-person evaluation if the symptoms worsen or if the condition fails to improve as anticipated.   Kirtland Bouchard, MD

## 2022-05-03 NOTE — Patient Instructions (Signed)

## 2022-05-04 LAB — VITAMIN D 25 HYDROXY (VIT D DEFICIENCY, FRACTURES): Vit D, 25-Hydroxy: 69 ng/mL (ref 30–100)

## 2022-05-04 LAB — COMPLETE METABOLIC PANEL WITH GFR
AG Ratio: 1.4 (calc) (ref 1.0–2.5)
ALT: 17 U/L (ref 6–29)
AST: 17 U/L (ref 10–35)
Albumin: 4 g/dL (ref 3.6–5.1)
Alkaline phosphatase (APISO): 95 U/L (ref 37–153)
BUN: 18 mg/dL (ref 7–25)
CO2: 28 mmol/L (ref 20–32)
Calcium: 9.4 mg/dL (ref 8.6–10.4)
Chloride: 105 mmol/L (ref 98–110)
Creat: 1 mg/dL (ref 0.60–1.00)
Globulin: 2.8 g/dL (calc) (ref 1.9–3.7)
Glucose, Bld: 74 mg/dL (ref 65–99)
Potassium: 4.2 mmol/L (ref 3.5–5.3)
Sodium: 143 mmol/L (ref 135–146)
Total Bilirubin: 0.7 mg/dL (ref 0.2–1.2)
Total Protein: 6.8 g/dL (ref 6.1–8.1)
eGFR: 59 mL/min/{1.73_m2} — ABNORMAL LOW (ref >=60)

## 2022-05-04 LAB — LIPID PANEL
Cholesterol: 136 mg/dL (ref <200)
HDL: 50 mg/dL (ref >=50)
LDL Cholesterol (Calc): 58 mg/dL (calc)
Non-HDL Cholesterol (Calc): 86 mg/dL (calc) (ref <130)
Total CHOL/HDL Ratio: 2.7 (calc) (ref <5.0)
Triglycerides: 213 mg/dL — ABNORMAL HIGH (ref <150)

## 2022-05-04 LAB — CBC WITH DIFFERENTIAL/PLATELET
Absolute Monocytes: 929 cells/uL (ref 200–950)
Basophils Absolute: 32 cells/uL (ref 0–200)
Basophils Relative: 0.3 %
Eosinophils Absolute: 119 cells/uL (ref 15–500)
Eosinophils Relative: 1.1 %
HCT: 42.1 % (ref 35.0–45.0)
Hemoglobin: 14.1 g/dL (ref 11.7–15.5)
Lymphs Abs: 3240 cells/uL (ref 850–3900)
MCH: 29.8 pg (ref 27.0–33.0)
MCHC: 33.5 g/dL (ref 32.0–36.0)
MCV: 89 fL (ref 80.0–100.0)
MPV: 10.8 fL (ref 7.5–12.5)
Monocytes Relative: 8.6 %
Neutro Abs: 6480 cells/uL (ref 1500–7800)
Neutrophils Relative %: 60 %
Platelets: 262 10*3/uL (ref 140–400)
RBC: 4.73 10*6/uL (ref 3.80–5.10)
RDW: 13.2 % (ref 11.0–15.0)
Total Lymphocyte: 30 %
WBC: 10.8 10*3/uL (ref 3.8–10.8)

## 2022-05-04 LAB — MAGNESIUM: Magnesium: 2.3 mg/dL (ref 1.5–2.5)

## 2022-05-04 LAB — HEMOGLOBIN A1C
Hgb A1c MFr Bld: 5.9 % of total Hgb — ABNORMAL HIGH (ref <5.7)
Mean Plasma Glucose: 123 mg/dL
eAG (mmol/L): 6.8 mmol/L

## 2022-05-04 LAB — TSH: TSH: 1.33 mIU/L (ref 0.40–4.50)

## 2022-05-04 LAB — INSULIN, RANDOM: Insulin: 26.4 u[IU]/mL — ABNORMAL HIGH

## 2022-05-04 NOTE — Progress Notes (Signed)
<><><><><><><><><><><><><><><><><><><><><><><><><><><><><><><><><> <><><><><><><><><><><><><><><><><><><><><><><><><><><><><><><><><> -   Test results slightly outside the reference range are not unusual. If there is anything important, I will review this with you,  otherwise it is considered normal test values.  If you have further questions,  please do not hesitate to contact me at the office or via My Chart.  <><><><><><><><><><><><><><><><><><><><><><><><><><><><><><><><><> <><><><><><><><><><><><><><><><><><><><><><><><><><><><><><><><><>  -  Kidney functions still Stage 3a  and Stable <><><><><><><><><><><><><><><><><><><><><><><><><><><><><><><><><>  -  Total Chol = 136 & LDL 58  - Both  Excellent   - Very low risk for Heart Attack  / Stroke <><><><><><><><><><><><><><><><><><><><><><><><><><><><><><><><><>  -  Triglycerides (   213   ) or fats in blood are too high  (goal is less than 150)    - Recommend avoid fried & greasy foods,  sweets / candy,   - Avoid white rice  (brown or wild rice or Quinoa is OK),   - Avoid white potatoes  (sweet potatoes are OK)   - Avoid anything made from white flour  - bagels, doughnuts, rolls, buns, biscuits, white and   wheat breads, pizza crust and traditional  pasta made of white flour & egg white  - (vegetarian pasta or spinach or wheat pasta is OK).    - Multi-grain bread is OK - like multi-grain flat bread or  sandwich thins.   - Avoid alcohol in excess.   - Exercise is also important. <><><><><><><><><><><><><><><><><><><><><><><><><><><><><><><><><>  -  A1c = 5.9%  is Still Blood sugar and A1c are elevated in the borderline and  early or pre-diabetes range which has the same   300% increased risk for heart attack, stroke, cancer and   alzheimer- type vascular dementia as full blown diabetes.   But the good news is that diet, exercise with  weight loss can cure the early diabetes at this  point. <><><><><><><><><><><><><><><><><><><><><><><><><><><><><><><><><>  -  Vitamin D =69  - Excellent  - Please keep dose same  <><><><><><><><><><><><><><><><><><><><><><><><><><><><><><><><><>  -  All Else - CBC - Electrolytes - Liver - Magnesium & Thyroid    - all  Normal / OK <><><><><><><><><><><><><><><><><><><><><><><><><><><><><><><><><>

## 2022-05-16 ENCOUNTER — Other Ambulatory Visit: Payer: Self-pay | Admitting: Internal Medicine

## 2022-05-16 DIAGNOSIS — K219 Gastro-esophageal reflux disease without esophagitis: Secondary | ICD-10-CM

## 2022-05-16 DIAGNOSIS — C50911 Malignant neoplasm of unspecified site of right female breast: Secondary | ICD-10-CM | POA: Diagnosis not present

## 2022-05-16 DIAGNOSIS — E039 Hypothyroidism, unspecified: Secondary | ICD-10-CM

## 2022-05-16 MED ORDER — PANTOPRAZOLE SODIUM 40 MG PO TBEC
40.0000 mg | DELAYED_RELEASE_TABLET | Freq: Every day | ORAL | 1 refills | Status: DC
  Filled 2022-11-03: qty 90, 90d supply, fill #0
  Filled 2023-01-25 (×2): qty 90, 90d supply, fill #1

## 2022-05-16 MED ORDER — LEVOTHYROXINE SODIUM 100 MCG PO TABS
ORAL_TABLET | ORAL | 1 refills | Status: DC
  Filled 2022-11-03: qty 90, 90d supply, fill #0
  Filled 2023-02-09: qty 90, 90d supply, fill #1

## 2022-05-17 ENCOUNTER — Telehealth: Payer: Self-pay | Admitting: *Deleted

## 2022-05-17 NOTE — Telephone Encounter (Signed)
Received call from pt with complaint of increased hot flashes and tinnitus bilateral ears x several weeks.  Pt states symptoms began shortly after starting letrozole.  Per MD pt needing to stop letrozole x2 - 3 weeks and f/u in office to assess symptoms.  Pt educated and verbalized understanding.

## 2022-06-09 ENCOUNTER — Encounter: Payer: Self-pay | Admitting: *Deleted

## 2022-06-13 ENCOUNTER — Encounter: Payer: Self-pay | Admitting: Adult Health

## 2022-06-13 ENCOUNTER — Other Ambulatory Visit: Payer: Self-pay

## 2022-06-13 ENCOUNTER — Inpatient Hospital Stay: Payer: PPO | Attending: Adult Health | Admitting: Adult Health

## 2022-06-13 VITALS — BP 117/58 | HR 66 | Temp 98.4°F | Resp 18 | Ht 67.0 in | Wt 222.8 lb

## 2022-06-13 DIAGNOSIS — Z170 Estrogen receptor positive status [ER+]: Secondary | ICD-10-CM | POA: Diagnosis not present

## 2022-06-13 DIAGNOSIS — C50411 Malignant neoplasm of upper-outer quadrant of right female breast: Secondary | ICD-10-CM | POA: Insufficient documentation

## 2022-06-13 DIAGNOSIS — Z79899 Other long term (current) drug therapy: Secondary | ICD-10-CM | POA: Diagnosis not present

## 2022-06-13 DIAGNOSIS — Z79811 Long term (current) use of aromatase inhibitors: Secondary | ICD-10-CM | POA: Diagnosis not present

## 2022-06-13 DIAGNOSIS — Z87891 Personal history of nicotine dependence: Secondary | ICD-10-CM | POA: Insufficient documentation

## 2022-06-13 MED ORDER — ANASTROZOLE 1 MG PO TABS: 1.0000 mg | ORAL_TABLET | Freq: Every day | ORAL | 1 refills | Status: DC

## 2022-06-13 NOTE — Progress Notes (Signed)
SURVIVORSHIP VISIT:    BRIEF ONCOLOGIC HISTORY:  Oncology History  Malignant neoplasm of upper-outer quadrant of right breast in female, estrogen receptor positive ([place])  02/01/2022 Initial Diagnosis   Screening mammogram detected 0.6 cm right breast mass.  By ultrasound it measured 0.4 cm.  Stereotactic biopsy revealed grade 2 IDC and intermediate grade DCIS ER 100%, PR 100%, HER2 negative, Ki-67 10%   02/02/2022 Cancer Staging   Staging form: Breast, AJCC 8th Edition - Clinical: Stage IA (cT1b, cN0, cM0, G2, ER+, PR+, HER2-) - Signed by Nicholas Lose, MD on 02/02/2022 Stage prefix: Initial diagnosis Histologic grading system: 3 grade system   02/24/2022 Surgery   02/24/2022: Right lumpectomy: Grade 2 IDC 8 mm (2 contiguous blocks), intermediate grade DCIS, margins negative, ER 100%, PR 100%, HER2 negative, Ki-67 10%   03/15/2022 -  Anti-estrogen oral therapy   Letrozole x  5 years     INTERVAL HISTORY:  [name] to review her survivorship care plan detailing her treatment course for breast cancer, as well as monitoring long-term side effects of that treatment, education regarding health maintenance, screening, and overall wellness and health promotion.     Overall, [name] reports feeling quite well.  She was experiencing increased hot flashes, swelling and she called our office and was instructed to stop the letrozole.  Since stopping letrozole on 05/18/2022 she has been feeling much improved and the hot flashes have resolved.    She underwent bone density testing in 03/2022 that was normal.  She also has a 25 pack year tobacco history and quit smoking in 05/2013.  She is interested in lung cancer screening.    REVIEW OF SYSTEMS:  Review of Systems  Constitutional:  Negative for appetite change, chills, fatigue, fever and unexpected weight change.  HENT:   Negative for hearing loss, lump/mass and trouble swallowing.   Eyes:  Negative for eye problems and icterus.  Respiratory:   Negative for chest tightness, cough and shortness of breath.   Cardiovascular:  Negative for chest pain, leg swelling and palpitations.  Gastrointestinal:  Negative for abdominal distention, abdominal pain, constipation, diarrhea, nausea and vomiting.  Endocrine: Negative for hot flashes.  Genitourinary:  Negative for difficulty urinating.   Musculoskeletal:  Negative for arthralgias.  Skin:  Negative for itching and rash.  Neurological:  Negative for dizziness, extremity weakness, headaches and numbness.  Hematological:  Negative for adenopathy. Does not bruise/bleed easily.  Psychiatric/Behavioral:  Negative for depression. The patient is not nervous/anxious.   Breast: Denies any new nodularity, masses, tenderness, nipple changes, or nipple discharge.      ONCOLOGY TREATMENT TEAM:  1. Surgeon:  Dr. Ninfa Linden at [facility] 2. Medical Oncologist: Dr. Lindi Adie  3. Radiation Oncologist: Dr. Isidore Moos    PAST MEDICAL/SURGICAL HISTORY:  Past Medical History:  Diagnosis Date   Anemia    Arthritis    Chest tightness    Fatigue    GERD (gastroesophageal reflux disease)    Heart palpitations 12/06/2019   Hyperlipidemia    Hypothyroid    Internal hemorrhoids 05/04/2009   Qualifier: Diagnosis of  By: Nelson-Smith CMA (AAMA), Dottie     Melanoma [facility]) 2013   Excision/surgery only. No other treatment   Myalgia    Neuromuscular disorder ([place])    Neuropathy   Obesity    Pre-diabetes    Prediabetes    Ulcer    Varicose veins    Past Surgical History:  Procedure Laterality Date   ABDOMINAL HYSTERECTOMY  Total    BREAST LUMPECTOMY WITH RADIOACTIVE SEED LOCALIZATION Right 02/24/2022   Procedure: RIGHT BREAST LUMPECTOMY WITH RADIOACTIVE SEED LOCALIZATION;  Surgeon: [name], MD;  Location: [facility];  Service: General;  Laterality: Right;   CATARACT EXTRACTION Bilateral    Dr. Tommy Rainwater   CYST REMOVAL HAND Left 07/30/2018   NASAL SINUS SURGERY     ROTATOR CUFF REPAIR  Left more than 7 years ago   Dr. Percell Miller and Dr. Earleen Newport   TONSILLECTOMY AND ADENOIDECTOMY       ALLERGIES:  Allergies  Allergen Reactions   Procaine Hcl Shortness Of Breath    Ok to use sq lidocaine for iv starts   Ciprofloxacin     Unknown reaction   Macrobid [Nitrofurantoin Macrocrystal]     Unknown reaction   Sulfonamide Derivatives Hives   Tetanus Toxoid Swelling    REACTION: arm swells   Tuberculin Ppd Swelling   Penicillins Rash     CURRENT MEDICATIONS:  Outpatient Encounter Medications as of 06/13/2022  Medication Sig   acetaminophen (TYLENOL) 650 MG CR tablet Take 650 mg by mouth every 8 (eight) hours as needed for pain.   anastrozole (ARIMIDEX) 1 MG tablet Take 1 tablet (1 mg total) by mouth daily.   aspirin 81 MG tablet Take 81 mg by mouth daily.   atorvastatin (LIPITOR) 40 MG tablet Take 1 tablet Daily  for Cholesterol   Biotin 5000 MCG CHEW Chew 5,000 mcg by mouth daily.   cholecalciferol (VITAMIN D3) 25 MCG (1000 UNIT) tablet Take 3,000 Units by mouth daily.   cyanocobalamin 1000 MCG tablet Take 1,000 mcg by mouth daily.   diclofenac Sodium (VOLTAREN) 1 % GEL Apply 4 g topically 4 (four) times daily. (Patient taking differently: Apply 4 g topically 4 (four) times daily as needed (pain).)   dicyclomine (BENTYL) 20 MG tablet Take  1 tablet  3 x /day  if needed for Nausea, Bloating, Cramping or Diarrhea   dorzolamide-timolol (COSOPT) 22.3-6.8 MG/ML ophthalmic solution Place 1 drop into both eyes 2 (two) times daily.   ezetimibe (ZETIA) 10 MG tablet Take  1 tablet  Daily  for Cholesterol   famotidine (PEPCID) 40 MG tablet Take 40 mg by mouth daily.   furosemide (LASIX) 40 MG tablet Take 1 tablet 1 or  2 x /day for BP & Fluid Retention   gabapentin (NEURONTIN) 800 MG tablet Take  1 capsule 3 x /day - meals &  2 capsules  at Bedtime for Chronic Pain & Hot Flashes   levothyroxine (SYNTHROID) 100 MCG tablet Take  1 whole  tablet  Daily  on an empty stomach with only water  for 30 minutes & no Antacid meds, Calcium or Magnesium for 4 hours & avoid Biotin   Multiple Vitamin (MULTIVITAMIN) tablet Take 1 tablet by mouth daily.   OVER THE COUNTER MEDICATION Apply 1 application. topically daily as needed (pain). Stop Pain   OVER THE COUNTER MEDICATION Take 1 capsule by mouth daily. Tru Niagen   pantoprazole (PROTONIX) 40 MG tablet Take  1 tablet  Daily  to Prevent Heartburn & Indigestion   Probiotic Product (PROBIOTIC DAILY PO) Take 1 capsule by mouth daily.   fluconazole (DIFLUCAN) 150 MG tablet Take 1 tablet 2 x /week  if needed for yeast infection (Patient not taking: Reported on 06/13/2022)   ondansetron (ZOFRAN) 4 MG tablet Take  1 tablet  3 x /day  if needed for nausea & Vomiting (Patient not taking: Reported on 06/13/2022)   pseudoephedrine (  SUDAFED) 120 MG 12 hr tablet Take   1 tablet    2 x /day (every 12 hours)    for Sinus & Chest Congestion (Patient not taking: Reported on 06/13/2022)   [DISCONTINUED] letrozole (FEMARA) 2.5 MG tablet Take 1 tablet (2.5 mg total) by mouth daily. (Patient not taking: Reported on 06/13/2022)   [DISCONTINUED] traMADol (ULTRAM) 50 MG tablet Take 1-2 tablets (50-100 mg total) by mouth every 6 (six) hours as needed. (Patient not taking: Reported on 06/13/2022)   No facility-administered encounter medications on file as of 06/13/2022.     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  Widow.     Social Determinants of Health   Financial Resource Strain: Not on file  Food Insecurity: Not on file  Transportation Needs: Not on file  Physical Activity: Not on file  Stress: Not on file  Social Connections: Not on file  Intimate Partner Violence: Not on file     OBSERVATIONS/OBJECTIVE:  BP (!) 117/58 (BP Location: Left Arm, Patient Position: Sitting) Comment: nurse notified  Pulse 66   Temp 98.4 F (36.9 C) (Temporal)   Resp 18   Ht 5\' 7"  (1.702 m)   Wt 222 lb 12.8 oz (101.1 kg)   SpO2 97%   BMI 34.90 kg/m  GENERAL: Patient is a well appearing female in no acute distress HEENT:  Sclerae anicteric.  Oropharynx clear and moist. No ulcerations or evidence of oropharyngeal candidiasis. Neck is supple.  NODES:  No cervical, supraclavicular, or axillary lymphadenopathy palpated.  BREAST EXAM:  Right breast s/p lumpectomy, no sign of local recurrence, left breast benign. LUNGS:  Clear to auscultation bilaterally.  No wheezes or rhonchi. HEART:  Regular rate and rhythm. No murmur appreciated. ABDOMEN:  Soft, nontender.  Positive, normoactive bowel sounds. No organomegaly palpated. MSK:  No focal spinal tenderness to palpation. Full range of motion bilaterally in the upper extremities. EXTREMITIES:  No peripheral edema.   SKIN:  Clear with no obvious rashes or skin changes. No nail dyscrasia. NEURO:  Nonfocal. Well oriented.  Appropriate affect.  LABORATORY DATA:  None for this  visit.  DIAGNOSTIC IMAGING:  None for this visit.      ASSESSMENT AND PLAN:  Ms.. [name] is a pleasant 76 y.o. female with Stage IA right breast invasive ductal carcinoma, ER+/PR+/HER2-, diagnosed in 01/2022, treated with lumpectomy and anti-estrogen therapy with Letrozole beginning in 02/2022.  She presents to the Survivorship Clinic for our initial meeting and routine follow-up post-completion of treatment for breast cancer.    1. Stage IA right breast cancer:  [name] is continuing to recover from definitive treatment for breast cancer. She will follow-up with her medical oncologist, Dr. Lindi Adie in 6 months with history and physical exam per surveillance protocol.  She will continue her anti-estrogen therapy with Letrozole. Thus far, she is tolerating the Letrozole well, with minimal side effects. She was instructed to make Dr. Lindi Adie or myself aware if she begins to experience any worsening side effects of the medication and I could see her back in clinic to help manage those side effects, as needed. Her mammogram is due 01/2023; orders placed today.    Today, a comprehensive survivorship care plan and treatment summary was reviewed with the patient today detailing her breast cancer diagnosis, treatment course, potential late/long-term effects of treatment, appropriate follow-up care with recommendations for the future, and patient education resources.  A copy of this summary, along with a letter will be sent to the patient's primary care provider via mail/fax/In Basket message after today's visit.    2. Lung cancer screening:   I discussed screening LOw DOse Ct chest without contrast for early detection of lung cancer in this Counseling and Shared Decision-Making Visit  Patient [name] with [birth year] and Age 76 y.o. years met the following criteria and I counseled that in  A) age 26-75 AND B) smoking history of 20 pack year smoking AND C) Current smoker or one who has quit smoking  within the last 15 years  I counseled  - Annual low dose CT chest can pick up lung cancer early and has potential to save lives and cure lung cancer - This is similar in concept to screening mammogram, colonoscopies and pap smears - I explained Ct scan chest is low dose radiation CT chest - I explained early lung cancer asymptomatic and only way to  detect is CT  With the real advantage that early lung cancer is curable through radiation or surgery - I explained CT superior to CXR - I explained that false positives are present and can incur cost and workup like biopsies, additional scan but benefit outweighs risk - I counseled that patient should MAINTAIN REMISSION in smoking - I counseled that patient should adhered to protocol requirements of scan and followup scans  3. Bone health:  Given [name]'s age/history of breast cancer and her current treatment regimen including anti-estrogen therapy with Letrozole, she is at risk for bone demineralization.  Her last DEXA scan was 03/24/2022, which was normal.  In the meantime, she was encouraged to increase her consumption of foods rich in calcium, as well as increase her weight-bearing activities.  She was given education on specific activities to promote bone health.  4. Cancer screening:  Due to [name]'s history and her age, she should receive screening for skin cancers, colon cancer, and gynecologic cancers.  The information and recommendations are listed on the patient's comprehensive care plan/treatment summary and were reviewed in detail with the patient.    5. Health maintenance and wellness promotion: [name] was encouraged to consume 5-7 servings  of fruits and vegetables per day. We reviewed the "Nutrition Rainbow" handout.  She was also encouraged to engage in moderate to vigorous exercise for 30 minutes per day most days of the week. We discussed the [employer] fitness program, which is designed for cancer survivors to help them  become more physically fit after cancer treatments.  She was instructed to limit her alcohol consumption and continue to abstain from tobacco use.     6. Support services/counseling: It is not uncommon for this period of the patient's cancer care trajectory to be one of many emotions and stressors. .   She was given information regarding our available services and encouraged to contact me with any questions or for help enrolling in any of our support group/programs.    Follow up instructions:    -Return to cancer center in 3 months for f/u on Anastrozole change, then 6 months for f/u with Dr. Lindi Adie -Mammogram due in 01/2023 -Lung cancer screening ordered -Follow up with surgery 1 year -She is welcome to return back to the Survivorship Clinic at any time; no additional follow-up needed at this time.  -Consider referral back to survivorship as a long-term survivor for continued surveillance  The patient was provided an opportunity to ask questions and all were answered. The patient agreed with the plan and demonstrated an understanding of the instructions.   Total encounter time:40 minutes*in face-to-face visit time, chart review, lab review, care coordination, order entry, and documentation of the encounter time.    Wilber Bihari, NP 06/13/22 2:24 PM Medical Oncology and Hematology [facility] [place], [place] [address] Tel. [phone]    Fax. [phone]  *Total Encounter Time as defined by the Centers for Medicare and Medicaid Services includes, in addition to the face-to-face time of a patient visit (documented in the note above) non-face-to-face time: obtaining and reviewing outside history, ordering and reviewing medications, tests or procedures, care coordination (communications with other health care professionals or caregivers) and documentation in the medical record.

## 2022-06-16 ENCOUNTER — Telehealth: Payer: Self-pay | Admitting: *Deleted

## 2022-06-16 ENCOUNTER — Telehealth: Payer: Self-pay | Admitting: Podiatry

## 2022-06-16 NOTE — Telephone Encounter (Signed)
[employer] is working on prior IT consultant for upcoming appointment, requesting number of visits for patient. Please advise.

## 2022-06-16 NOTE — Telephone Encounter (Signed)
Pt called stating her insurance needs prior Authorization to be approval since Appointment is before 61 days.  Please advise

## 2022-06-17 NOTE — Telephone Encounter (Signed)
Faxed requested information 06/17/22 for [employer].waiting for authorization

## 2022-06-17 NOTE — Telephone Encounter (Signed)
Faxed information to [employer].confirmation received 06/17/22,tried calling but could not reach, awaiting approval.

## 2022-06-20 ENCOUNTER — Telehealth: Payer: Self-pay | Admitting: Podiatry

## 2022-06-20 NOTE — Telephone Encounter (Signed)
Received call from [employer] about the recent authorization submitted. Pt has been approved for 1 visit and the authorization needs to be starting 10.11.2023. they only authorize them for 3 months at a time. So if you could revise the current authorization that was faxed to start 10.11.23 thru 1.10.2024

## 2022-06-20 NOTE — Telephone Encounter (Signed)
Information for physician

## 2022-06-27 ENCOUNTER — Encounter: Payer: Self-pay | Admitting: Podiatry

## 2022-06-27 ENCOUNTER — Ambulatory Visit: Payer: PPO | Admitting: Podiatry

## 2022-06-27 DIAGNOSIS — L97521 Non-pressure chronic ulcer of other part of left foot limited to breakdown of skin: Secondary | ICD-10-CM

## 2022-06-27 DIAGNOSIS — E1142 Type 2 diabetes mellitus with diabetic polyneuropathy: Secondary | ICD-10-CM

## 2022-06-27 DIAGNOSIS — M79675 Pain in left toe(s): Secondary | ICD-10-CM

## 2022-06-27 DIAGNOSIS — M79674 Pain in right toe(s): Secondary | ICD-10-CM | POA: Diagnosis not present

## 2022-06-27 DIAGNOSIS — B351 Tinea unguium: Secondary | ICD-10-CM | POA: Diagnosis not present

## 2022-06-27 DIAGNOSIS — L84 Corns and callosities: Secondary | ICD-10-CM

## 2022-06-27 MED ORDER — MUPIROCIN 2 % EX OINT: TOPICAL_OINTMENT | CUTANEOUS | 1 refills | Status: DC

## 2022-06-27 NOTE — Patient Instructions (Signed)
DRESSING CHANGES left lower extremity:   PHARMACY SHOPPING LIST: Saline or Wound Cleanser for cleaning wound 2 x 2 inch sterile gauze for cleaning wound Mupirocin Ointment  A. IF DISPENSED, WEAR SURGICAL SHOE OR WALKING BOOT AT ALL TIMES.  B. IF PRESCRIBED ORAL ANTIBIOTICS, TAKE ALL MEDICATION AS PRESCRIBED UNTIL ALL ARE GONE.  C. IF DOCTOR HAS DESIGNATED NONWEIGHTBEARING STATUS, PLEASE ADHERE TO INSTRUCTIONS.  1. KEEP left lower extremity DRY AT ALL TIMES!!!!  2. CLEANSE ULCER WITH SALINE OR WOUND CLEANSER.  3. DAB DRY WITH GAUZE SPONGE.  4. APPLY A LIGHT AMOUNT OF Mupirocin Ointment TO BASE OF ULCER.  5. APPLY OUTER DRESSING AS INSTRUCTED.  6. WEAR SURGICAL SHOE/BOOT DAILY AT ALL TIMES. IF SUPPLIED, WEAR HEEL PROTECTORS AT ALL TIMES WHEN IN BED.  7. DO NOT WALK BAREFOOT!!!  8.  IF YOU EXPERIENCE ANY FEVER, CHILLS, NIGHTSWEATS, NAUSEA OR VOMITING, ELEVATED OR LOW BLOOD SUGARS, REPORT TO EMERGENCY ROOM.  9. IF YOU EXPERIENCE INCREASED REDNESS, PAIN, SWELLING, DISCOLORATION, ODOR, PUS, DRAINAGE OR WARMTH OF YOUR FOOT, REPORT TO EMERGENCY ROOM.

## 2022-06-29 ENCOUNTER — Ambulatory Visit (HOSPITAL_COMMUNITY)
Admission: RE | Admit: 2022-06-29 | Discharge: 2022-06-29 | Disposition: A | Discharge status: Home / Self Care | Payer: PPO | Source: Ambulatory Visit | Attending: Adult Health | Admitting: Adult Health

## 2022-06-29 DIAGNOSIS — Z170 Estrogen receptor positive status [ER+]: Secondary | ICD-10-CM | POA: Insufficient documentation

## 2022-06-29 DIAGNOSIS — I2510 Atherosclerotic heart disease of native coronary artery without angina pectoris: Secondary | ICD-10-CM | POA: Insufficient documentation

## 2022-06-29 DIAGNOSIS — C50411 Malignant neoplasm of upper-outer quadrant of right female breast: Secondary | ICD-10-CM | POA: Diagnosis not present

## 2022-06-29 DIAGNOSIS — Z87891 Personal history of nicotine dependence: Secondary | ICD-10-CM | POA: Insufficient documentation

## 2022-06-29 DIAGNOSIS — J432 Centrilobular emphysema: Secondary | ICD-10-CM | POA: Insufficient documentation

## 2022-06-29 DIAGNOSIS — I700 Atherosclerosis of aorta: Secondary | ICD-10-CM | POA: Insufficient documentation

## 2022-06-29 DIAGNOSIS — Z122 Encounter for screening for malignant neoplasm of respiratory organs: Secondary | ICD-10-CM | POA: Insufficient documentation

## 2022-06-29 NOTE — Progress Notes (Signed)
Subjective:  Patient ID: [name], female    DOB: 10-30-1946,  MRN: [id number]  [name] presents to clinic today for at risk foot care with history of diabetic neuropathy and preulcerative lesion(s) bilateral 3rd toes and painful mycotic toenails that limit ambulation. Painful toenails interfere with ambulation. Aggravating factors include wearing enclosed shoe gear. Pain is relieved with periodic professional debridement. Painful porokeratotic lesions are aggravated when weightbearing with and without shoegear. Pain is relieved with periodic professional debridement.  New problem(s):  Patient states her left 3rd toe is sore on today's visit. She denies any drainage or swelling. She denies any fever, chills, night sweats, nausea or vomiting.          PCP is Unk Pinto, MD , and last visit was  May 03, 2022.  Allergies  Allergen Reactions   Procaine Hcl Shortness Of Breath    Ok to use sq lidocaine for iv starts   Ciprofloxacin     Unknown reaction   Macrobid [Nitrofurantoin Macrocrystal]     Unknown reaction   Sulfonamide Derivatives Hives   Tetanus Toxoid Swelling    REACTION: arm swells   Tuberculin Ppd Swelling   Penicillins Rash    Review of Systems: Negative except as noted in the HPI.  Objective: No changes noted in today's physical examination. Constitutional [name] is a pleasant 76 y.o. Caucasian female, morbidly obese in NAD. AAO x 3.   Vascular CFT <3 seconds b/l LE. Palpable DP pulse(s) b/l LE. Palpable PT pulse(s) b/l LE. No pain with calf compression b/l. Lower extremity skin temperature gradient within normal limits. +1 pitting edema noted BLE. Evidence of chronic venous insufficiency b/l LE. No ischemia or gangrene noted b/l LE. No cyanosis or clubbing noted b/l LE.  Neurologic Normal speech. Oriented to person, place, and time. Pt has subjective symptoms of neuropathy. Protective sensation decreased with 10 gram monofilament b/l.   Dermatologic Skin warm and supple b/l. LE. No interdigital macerations noted b/l LE.   Left 3rd toe with hyperkeratotic lesion distal tip with underlying fluctuance. Erythema noted at distal phalanx. Noted blood blister with partial thickness ulceration measuring 1.0 cm in diameter.  Toenails 1-5 b/l elongated, discolored, dystrophic, thickened, crumbly with subungual debris and tenderness to dorsal palpation.   Hyperkeratotic lesion(s) bilateral 2nd toes, b/l hallux, and submet head 5 left foot.  No erythema, no edema, no drainage, no fluctuance.   Preulcerative lesions of the distal tip of right 3rd digit. There is visible subdermal hemorrhage. There is no surrounding erythema, no edema, no drainage, no odor, no fluctuance.   Orthopedic: HAV with bunion deformity noted b/l LE. Severely contracted hammertoe deformity noted 1-5 bilaterally.           Radiographs: None     Latest Ref Rng & Units 05/03/2022    2:15 PM 01/27/2022    3:13 PM 10/12/2021    2:34 PM  Hemoglobin A1C  Hemoglobin-A1c <5.7 % of total Hgb 5.9  6.0  5.9     Assessment/Plan: 1. Pain due to onychomycosis of toenails of both feet   2. Skin ulcer of third toe of left foot, limited to breakdown of skin ([place])   3. Pre-ulcerative calluses   4. Diabetic peripheral neuropathy associated with type 2 diabetes mellitus ([place])     -Plan: -Patient was evaluated and treated. All patient's and/or POA's questions/concerns answered on today's visit. -Patient/POA/Family member educated on diagnosis and treatment plan of routine ulcer debridement/wound care.  -Ulceration debridement  achieved utilizing sharp excisional debridement with sterile scalpel blade.. Type/amount of devitalized tissue removed: nonviable hyperkeratosis -Today's ulcer size post-debridement: 1.0 x 1.0 x 0.1 cm. -Ulceration cleansed with wound cleanser. triple antibiotic ointment applied to base of ulceration and secured with light dressing. -Wound  responded well to today's debridement. -Patient risk factors affecting healing of ulcer: diabetes, diabetic neuropathy, history of foot/leg ulcer, HTN, CKD, hyperlipidemia, foot deformity -[name] given written instructions on daily wound care for L 3rd toe ulceration. -Prescription written for Mupirocin Ointment. Patient is to apply to distal tip of left 3rd toe once daily and cover with dressing.  -No xrays performed as wound is partial thickness. -Toenails 1-5 b/l were debrided in length and girth with sterile nail nippers and dremel without iatrogenic bleeding.  -Corn(s) bilateral 2nd toes and callus(es) bilateral great toes and submet head 5 left foot were pared utilizing sterile scalpel blade without incident. Total number debrided =5. -Preulcerative lesion pared distal tip of right 3rd toe. Total number pared=1. -Patient scheduled to see Dr. Lanae Crumbly  in 3 weeks for follow up of ulcer left 3rd toe. -Patient/POA to call should there be question/concern in the interim.  Return in about 3 weeks (around 07/18/2022) to see Dr. Sherryle Lis for follow up of ulcer left 3rd toe.  [name], DPM

## 2022-07-01 ENCOUNTER — Telehealth: Payer: Self-pay

## 2022-07-01 NOTE — Telephone Encounter (Signed)
-----   Message from Gardenia Phlegm, NP sent at 07/01/2022 10:32 AM EDT ----- Please let patient know that CT scan does not show any concern for lung cancer.  Recommend repeat CT in 1 year. ----- Message ----- From: Interface, Rad Results In Sent: 07/01/2022   6:19 AM EDT To: Gardenia Phlegm, NP

## 2022-07-01 NOTE — Telephone Encounter (Signed)
Called and spoke with pt, per NP CT negative for lung cancer.  Pt verbalized understanding and thanks

## 2022-07-07 ENCOUNTER — Ambulatory Visit (HOSPITAL_BASED_OUTPATIENT_CLINIC_OR_DEPARTMENT_OTHER): Payer: PPO | Admitting: Cardiology

## 2022-07-07 VITALS — BP 102/60 | HR 55 | Ht 66.0 in | Wt 225.7 lb

## 2022-07-07 DIAGNOSIS — E1169 Type 2 diabetes mellitus with other specified complication: Secondary | ICD-10-CM

## 2022-07-07 DIAGNOSIS — I10 Essential (primary) hypertension: Secondary | ICD-10-CM

## 2022-07-07 DIAGNOSIS — E785 Hyperlipidemia, unspecified: Secondary | ICD-10-CM

## 2022-07-07 DIAGNOSIS — R600 Localized edema: Secondary | ICD-10-CM

## 2022-07-07 DIAGNOSIS — I2510 Atherosclerotic heart disease of native coronary artery without angina pectoris: Secondary | ICD-10-CM

## 2022-07-07 NOTE — Progress Notes (Signed)
Cardiology Office Note:    Date:  07/07/2022   ID:  [name], DOB 19-Jan-1946, MRN [id number]  PCP:  Unk Pinto, MD  Cardiologist:  Buford Dresser, MD  Referring MD: Unk Pinto, MD   CC: follow up  History of Present Illness:    [name] is a 76 y.o. female with a hx of nonobstructive CAD, type II diabetes, hypertension, hyperlipidemia, obesity, GERD who is seen for follow up today. She was initially seen 12/06/19 as a new consult at the request of Unk Pinto, MD for the evaluation and management of palpitations. Also noted to have recent ER visit for chest pain.  Today: She has seen that there were cardiac findings on her CT scan of her lungs 06/29/22. We reviewed this in detail.  She continues to take 2 tablets of furosemide in the mornings. Her edema has noticeably improved.  In 02/2022 she underwent a lumpectomy. Most recently she is on anastrozole. She still has some hot flashes, but not as severe. She also has developed tinnitus since the first medication that was tried. This is not really noticeable until it is quiet.  Her most strenuous activity has been a long walk during a trip to the beach; she used her cane for support. Usually she is walking around her house. She tries to get up and walk regularly.   She denies any palpitations, chest pain, shortness of breath. No lightheadedness, headaches, syncope, orthopnea, or PND.   Past Medical History:  Diagnosis Date   Anemia    Arthritis    Chest tightness    Fatigue    GERD (gastroesophageal reflux disease)    Heart palpitations 12/06/2019   Hyperlipidemia    Hypothyroid    Internal hemorrhoids 05/04/2009   Qualifier: Diagnosis of  By: Nelson-Smith CMA (AAMA), Dottie     Melanoma [facility]) 2013   Excision/surgery only. No other treatment   Myalgia    Neuromuscular disorder ([place])    Neuropathy   Obesity    Pre-diabetes    Prediabetes    Ulcer    Varicose veins     Past Surgical  History:  Procedure Laterality Date   ABDOMINAL HYSTERECTOMY     Total    BREAST LUMPECTOMY WITH RADIOACTIVE SEED LOCALIZATION Right 02/24/2022   Procedure: RIGHT BREAST LUMPECTOMY WITH RADIOACTIVE SEED LOCALIZATION;  Surgeon: [name], MD;  Location: [place];  Service: General;  Laterality: Right;   CATARACT EXTRACTION Bilateral    Dr. Tommy Rainwater   CYST REMOVAL HAND Left 07/30/2018   NASAL SINUS SURGERY     ROTATOR CUFF REPAIR Left more than 7 years ago   Dr. Percell Miller and Dr. Earleen Newport   TONSILLECTOMY AND ADENOIDECTOMY      Current Medications: Current Outpatient Medications on File Prior to Visit  Medication Sig   acetaminophen (TYLENOL) 650 MG CR tablet Take 650 mg by mouth every 8 (eight) hours as needed for pain.   anastrozole (ARIMIDEX) 1 MG tablet Take 1 tablet (1 mg total) by mouth daily.   aspirin 81 MG tablet Take 81 mg by mouth daily.   atorvastatin (LIPITOR) 40 MG tablet Take 1 tablet Daily  for Cholesterol   Biotin 5000 MCG CHEW Chew 5,000 mcg by mouth daily.   cholecalciferol (VITAMIN D3) 25 MCG (1000 UNIT) tablet Take 3,000 Units by mouth daily.   cyanocobalamin 1000 MCG tablet Take 1,000 mcg by mouth daily.   diclofenac Sodium (VOLTAREN) 1 % GEL Apply 4 g topically 4 (four) times daily. (  Patient taking differently: Apply 4 g topically 4 (four) times daily as needed (pain).)   dicyclomine (BENTYL) 20 MG tablet Take  1 tablet  3 x /day  if needed for Nausea, Bloating, Cramping or Diarrhea   dorzolamide-timolol (COSOPT) 22.3-6.8 MG/ML ophthalmic solution Place 1 drop into both eyes 2 (two) times daily.   ezetimibe (ZETIA) 10 MG tablet Take  1 tablet  Daily  for Cholesterol   famotidine (PEPCID) 40 MG tablet Take 40 mg by mouth daily.   fluconazole (DIFLUCAN) 150 MG tablet Take 1 tablet 2 x /week  if needed for yeast infection   furosemide (LASIX) 40 MG tablet Take 1 tablet 1 or  2 x /day for BP & Fluid Retention   gabapentin (NEURONTIN) 800 MG tablet Take  1 capsule 3 x /day  - meals &  2 capsules  at Bedtime for Chronic Pain & Hot Flashes   levothyroxine (SYNTHROID) 100 MCG tablet Take  1 whole  tablet  Daily  on an empty stomach with only water for 30 minutes & no Antacid meds, Calcium or Magnesium for 4 hours & avoid Biotin   Multiple Vitamin (MULTIVITAMIN) tablet Take 1 tablet by mouth daily.   mupirocin ointment (BACTROBAN) 2 % Apply to left 3rd toe once daily   ondansetron (ZOFRAN) 4 MG tablet Take  1 tablet  3 x /day  if needed for nausea & Vomiting   OVER THE COUNTER MEDICATION Apply 1 application. topically daily as needed (pain). Stop Pain   pantoprazole (PROTONIX) 40 MG tablet Take  1 tablet  Daily  to Prevent Heartburn & Indigestion   Probiotic Product (PROBIOTIC DAILY PO) Take 1 capsule by mouth daily.   pseudoephedrine (SUDAFED) 120 MG 12 hr tablet Take   1 tablet    2 x /day (every 12 hours)    for Sinus & Chest Congestion   No current facility-administered medications on file prior to visit.     Allergies:   Procaine hcl, Ciprofloxacin, Macrobid [nitrofurantoin macrocrystal], Sulfonamide derivatives, Tetanus toxoid, Tuberculin ppd, and Penicillins   Social History   Tobacco Use   Smoking status: Former    Packs/day: 0.50    Years: 50.00    Total pack years: 25.00    Types: Cigarettes    Start date: 29    Quit date: 05/27/2013    Years since quitting: 9.1   Smokeless tobacco: Never  Vaping Use   Vaping Use: Never used  Substance Use Topics   Alcohol use: Not Currently   Drug use: Never    Family History: family history includes Breast cancer in her cousin; Coronary artery disease (age of onset: 6) in her father; Dementia in her maternal aunt and mother; Lung cancer in her maternal grandmother and mother.  ROS:   Please see the history of present illness.   (+) Hot flashes (+) Tinnitus (+) LE edema Additional pertinent ROS negative except as noted in HPI.   EKGs/Labs/Other Studies Reviewed:    The following studies were  reviewed today:  CT Chest  06/29/2022: IMPRESSION: 1. Lung-RADS 2S, benign appearance or behavior. Continue annual screening with low-dose chest CT without contrast in 12 months. 2. The "S" modifier above refers to potentially clinically significant non lung cancer related findings. Specifically, there is aortic atherosclerosis, in addition to three-vessel coronary artery disease. Please note that although the presence of coronary artery calcium documents the presence of coronary artery disease, the severity of this disease and any potential stenosis cannot be assessed  on this non-gated CT examination. Assessment for potential risk factor modification, dietary therapy or pharmacologic therapy may be warranted, if clinically indicated. 3. Mild diffuse bronchial wall thickening with very mild centrilobular and paraseptal emphysema; imaging findings suggestive of underlying COPD.   Aortic Atherosclerosis (ICD10-I70.0) and Emphysema (ICD10-J43.9).  CT cardiac 12/27/19 Aorta: Normal size. Ascending and descending aortic wall calcifications present. No dissection.   Aortic Valve:  Trileaflet.  No calcifications.   Coronary Arteries:  Normal coronary origin.  Right dominance.   RCA is a large dominant artery that gives rise to PDA and PLA. There is calcified plaque in the proximal segment causing mild non obstructive stenosis (25-49%). There is calcified plaque in the distal RCA causing moderate (50-69%) stenosis.   Left main is a large artery that gives rise to LAD and LCX arteries.   LAD is a large vessel that has calcified plaque in its proximal segment causing moderate (50-69%) stenosis.   LCX is a non-dominant artery that gives rise to an OM1 branch. There is calcified plaque the the proximal LCx causing moderate (50%) stenosis.   Other findings:   Normal pulmonary vein drainage into the left atrium.   Normal left atrial appendage without a thrombus.   Normal size of the  pulmonary artery.   IMPRESSION: 1. Coronary calcium score of 693. This was 78th percentile for age and sex matched control.   2. Normal coronary origin with right dominance.   3. Calcified plaque causing moderate stenosis in the prox LAD, distal RCA, proximal LCx.   4.CAD-RADS 3. Moderate stenosis. Consider symptom-guided anti-ischemic pharmacotherapy as well as risk factor modification per guideline directed care. Additional analysis with CT FFR will be submitted and reported separately.  FFR 1. Left Main:  No significant stenosis.  2. LAD: No significant stenosis.  FFR 0.88 3. LCX: No significant stenosis.  FFR 0.9 4. RCA: No significant stenosis.  FFR 0.91   IMPRESSION: 1.  CT FFR analysis didn't show any significant stenosis.  EKG:  EKG is personally reviewed.   07/07/2022:  sinus bradycardia at 55 bpm, with PAC and PVC, PRWP 06/30/2021: NSR with sinus arrhythmia, poor R wave progression similar to prior 01/03/2020: EKG was not ordered. 12/09/19: sinus bradycardia at 45bpm, no septal R wave.  Recent Labs: 05/03/2022: ALT 17; BUN 18; Creat 1.00; Hemoglobin 14.1; Magnesium 2.3; Platelets 262; Potassium 4.2; Sodium 143; TSH 1.33   Recent Lipid Panel    Component Value Date/Time   CHOL 136 05/03/2022 1415   TRIG 213 (H) 05/03/2022 1415   HDL 50 05/03/2022 1415   CHOLHDL 2.7 05/03/2022 1415   VLDL 38 (H) 02/27/2017 1703   LDLCALC 58 05/03/2022 1415    Physical Exam:    VS:  BP 102/60 (BP Location: Right Arm, Patient Position: Sitting, Cuff Size: Large)   Pulse (!) 55   Ht '5\' 6"'$  (1.676 m)   Wt 225 lb 11.2 oz (102.4 kg)   BMI 36.43 kg/m     Wt Readings from Last 3 Encounters:  07/07/22 225 lb 11.2 oz (102.4 kg)  06/13/22 222 lb 12.8 oz (101.1 kg)  05/03/22 221 lb 9.6 oz (100.5 kg)    GEN: Well nourished, well developed in no acute distress HEENT: Normal, moist mucous membranes NECK: No JVD CARDIAC: regular rhythm, normal S1 and S2, no rubs or gallops. No  murmur. VASCULAR: Radial and DP pulses 2+ bilaterally. No carotid bruits RESPIRATORY:  Clear to auscultation without rales, wheezing or rhonchi  ABDOMEN: Soft, non-tender, non-distended  MUSCULOSKELETAL:  Ambulates independently SKIN: Warm and dry, trivial bilateral nonpitting edema NEUROLOGIC:  Alert and oriented x 3. No focal neuro deficits noted. PSYCHIATRIC:  Normal affect   ASSESSMENT:    1. Coronary artery disease involving native coronary artery of native heart without angina pectoris   2. Essential hypertension   3. Bilateral leg edema   4. Type 2 diabetes mellitus with hyperlipidemia (HCC)    PLAN:    Nonobstructive CAD Family history of heart disease -CT with 3 vessel coronary artery disease without significant stenosis -continue aspirin and high intensity atorvastatin 40 mg -has not required nitroglycerin -discussed red flag symptoms, when to seek emergency medical care  Bilateral leg edema: trivial today -compression stockings previously discussed  Hypertension:goal <130/80 -at goal today, to borderline low, though asymptomatic -continue furosemide, would recommend cutting back to once daily once swelling is stable (takes extra in the summer)  Hyperlipidemia:  -continue atorvastatin 40 mg daily, ezetimibe 10 mg daily -aim for LDL <70 given CAD, last LDL 58 -counseled on lifestyle, below  Type II diabetes: -currently diet controlled, last A1c 5.9 -consider SGLT2i or GLP1RA in the future given CAD  Cardiac risk counseling and prevention recommendations: -recommend heart healthy/Mediterranean diet, with whole grains, fruits, vegetable, fish, lean meats, nuts, and olive oil. Limit salt. -recommend moderate walking, 3-5 times/week for 30-50 minutes each session. Aim for at least 150 minutes.week. Goal should be pace of 3 miles/hours, or walking 1.5 miles in 30 minutes -recommend avoidance of tobacco products. Avoid excess alcohol.  Plan for follow up: 1 year or  sooner as needed.  Buford Dresser, MD, PhD [place]  [facility]   Medication Adjustments/Labs and Tests Ordered: Current medicines are reviewed at length with the patient today.  Concerns regarding medicines are outlined above.   Orders Placed This Encounter  Procedures   EKG 12-Lead   No orders of the defined types were placed in this encounter.  Patient Instructions  Medication Instructions:  Your Physician recommend you continue on your current medication as directed.    *If you need a refill on your cardiac medications before your next appointment, please call your pharmacy*   Lab Work: None ordered today   Testing/Procedures: None ordered today   Follow-Up: At [facility], you and your health needs are our priority.  As part of our continuing mission to provide you with exceptional heart care, we have created designated Provider Care Teams.  These Care Teams include your primary Cardiologist (physician) and Advanced Practice Providers (APPs -  Physician Assistants and Nurse Practitioners) who all work together to provide you with the care you need, when you need it.  We recommend signing up for the patient portal called "MyChart".  Sign up information is provided on this After Visit Summary.  MyChart is used to connect with patients for Virtual Visits (Telemedicine).  Patients are able to view lab/test results, encounter notes, upcoming appointments, etc.  Non-urgent messages can be sent to your provider as well.   To learn more about what you can do with MyChart, go to [email or web address].    Your next appointment:   1 year(s)  The format for your next appointment:   In Person  Provider:   Buford Dresser, MD{   Look into Elastic Therapy for fitted compression stockings: [address] [place], [place], [place] [address] [phone] [email or web address]   Recent data, summarized from [place] from a study from the [facility]:  https://wilkins.info/  "Researchers at the [facility] set  out to answer this question by comparing statins to supplements in a clinical trial. They tracked the outcomes of 190 adults, ages 2 to 75. Some participants were given a 5 mg daily dose of rosuvastatin, a statin that is sold under the brand name Crestor for 28 days. Others were given supplements, including fish oil, cinnamon, garlic, turmeric, plant sterols or red yeast rice for the same period."  "What we found was that rosuvastatin lowered LDL cholesterol by almost 38% and that was vastly superior to placebo and any of the six supplements studied in the trial," study Despina Hidden, M.D. of the [facility], [place] told NPR. He says this level of reduction is enough to lower the risk of heart attacks and strokes. The findings are published in the Journal of the [employer] of Cardiology.  "Oftentimes these supplements are marketed as 'natural ways' to lower your cholesterol," says Laffin. But he says none of the dietary supplements demonstrated any significant decrease in LDL cholesterol compared with a placebo. LDL cholesterol is considered the 'bad cholesterol' because it can contribute to plaque build-up in the artery walls - which can narrow the arteries, and set the stage for heart attacks and strokes"    I,Mathew Stumpf,acting as a scribe for [employer], MD.,have documented all relevant documentation on the behalf of Buford Dresser, MD,as directed by  Buford Dresser, MD while in the presence of Buford Dresser, MD.  I, Buford Dresser, MD, have reviewed all documentation for this visit. The documentation on 07/07/22 for the exam, diagnosis, procedures, and orders are all accurate and complete.   Signed, Buford Dresser, MD PhD 07/07/2022     [place]

## 2022-07-07 NOTE — Patient Instructions (Addendum)
Medication Instructions:  Your Physician recommend you continue on your current medication as directed.    *If you need a refill on your cardiac medications before your next appointment, please call your pharmacy*   Lab Work: None ordered today   Testing/Procedures: None ordered today   Follow-Up: At [facility], you and your health needs are our priority.  As part of our continuing mission to provide you with exceptional heart care, we have created designated Provider Care Teams.  These Care Teams include your primary Cardiologist (physician) and Advanced Practice Providers (APPs -  Physician Assistants and Nurse Practitioners) who all work together to provide you with the care you need, when you need it.  We recommend signing up for the patient portal called "MyChart".  Sign up information is provided on this After Visit Summary.  MyChart is used to connect with patients for Virtual Visits (Telemedicine).  Patients are able to view lab/test results, encounter notes, upcoming appointments, etc.  Non-urgent messages can be sent to your provider as well.   To learn more about what you can do with MyChart, go to [email or web address].    Your next appointment:   1 year(s)  The format for your next appointment:   In Person  Provider:   Buford Dresser, MD{   Look into Elastic Therapy for fitted compression stockings: [address] [place], [place], [place] [address] [phone] [email or web address]   Recent data, summarized from [place] from a study from the [facility]:  https://wilkins.info/  "Researchers at the [facility] set out to answer this question by comparing statins to supplements in a clinical trial. They tracked the outcomes of 190 adults, ages 55 to 59. Some participants were given a 5 mg daily dose of rosuvastatin, a statin  that is sold under the brand name Crestor for 28 days. Others were given supplements, including fish oil, cinnamon, garlic, turmeric, plant sterols or red yeast rice for the same period."  "What we found was that rosuvastatin lowered LDL cholesterol by almost 38% and that was vastly superior to placebo and any of the six supplements studied in the trial," study Despina Hidden, M.D. of the [facility], [place] told NPR. He says this level of reduction is enough to lower the risk of heart attacks and strokes. The findings are published in the Journal of the [employer] of Cardiology.  "Oftentimes these supplements are marketed as 'natural ways' to lower your cholesterol," says Laffin. But he says none of the dietary supplements demonstrated any significant decrease in LDL cholesterol compared with a placebo. LDL cholesterol is considered the 'bad cholesterol' because it can contribute to plaque build-up in the artery walls - which can narrow the arteries, and set the stage for heart attacks and strokes"

## 2022-07-18 ENCOUNTER — Ambulatory Visit: Payer: PPO | Admitting: Podiatry

## 2022-07-18 DIAGNOSIS — M2032 Hallux varus (acquired), left foot: Secondary | ICD-10-CM | POA: Diagnosis not present

## 2022-07-18 DIAGNOSIS — M2042 Other hammer toe(s) (acquired), left foot: Secondary | ICD-10-CM

## 2022-07-18 NOTE — Progress Notes (Signed)
  Subjective:  Patient ID: [name], female    DOB: 12-21-1945,  MRN: [id number]  Chief Complaint  Patient presents with   Wound Check    Ulcer to 3rd toe on left foot- Pre-diabetic. Patient stated she is not sure when the ulcer appeared. Patient saw Dr. Elisha Ponder on 06/27/2022.     76 y.o. female presents with the above complaint. History confirmed with patient.  Says the drainage has stopped and there is no active wound right now  Objective:  Physical Exam: warm, good capillary refill, no trophic changes or ulcerative lesions, normal DP and PT pulses, she has bilateral hallux malleus and multiple hammertoes that are only semireducible in nature with prominent metatarsal heads and a pes cavus foot type  Assessment:   1. Hammertoe of left foot   2. Hallux malleus, left      Plan:  Patient was evaluated and treated and all questions answered.  I reviewed her clinical progress and her ulcerations and how these relate to her hammertoe and hallux malleus deformities.  Currently this seems to be healed.  Says they do not hurt on a daily basis.  She does have neuropathy and this may be secondary to this.  She is has her A1c is very well controlled and I think overall she is globally low risk for reulceration infection unless this worsens.  We discussed preventative repair of her hammertoes and hallux valgus deformity but this would be a relatively lengthy surgical undertaking but is not out of the question.  She will return to see me if this worsens or does not improve.  I do not think flexor tenotomy's alone will be able to reduce her deformity  Return if symptoms worsen or fail to improve.

## 2022-07-29 ENCOUNTER — Other Ambulatory Visit: Payer: Self-pay | Admitting: Internal Medicine

## 2022-07-29 DIAGNOSIS — R1030 Lower abdominal pain, unspecified: Secondary | ICD-10-CM

## 2022-07-29 DIAGNOSIS — R102 Pelvic and perineal pain: Secondary | ICD-10-CM

## 2022-07-29 DIAGNOSIS — K5730 Diverticulosis of large intestine without perforation or abscess without bleeding: Secondary | ICD-10-CM

## 2022-08-08 NOTE — Progress Notes (Unsigned)
6 MONTH FOLLOW UP   Assessment and Plan:   Malignant Neoplasm of upper outer quadrant right female breast Continue anastrozole and follow with oncology  Controlled type 2 diabetes mellitus with complication, without long-term current use of insulin ([place]) Discussed general issues about diabetes pathophysiology and management., Educational material distributed., Suggested low cholesterol diet., Encouraged aerobic exercise., Discussed foot care., Reminded to get yearly retinal exam. -     Hemoglobin A1c ([facility])  Hyperlipidemia associated with T2DM (HCC) check lipids, statin/zetia for LDL goal <70 decrease fatty foods increase activity.  -    Lipid panel   CKD stage 3 secondary to diabetes (HCC) Increase fluids, avoid NSAIDS, monitor sugars, will monitor -    CMP/GFR, CBC -    UA/microalbumin  Atherosclerosis of aorta ([place]) Per CT 03/23/17 Control blood pressure, cholesterol, glucose, increase exercise.   CAD Cardiology following; defer EKG to them Control blood pressure, cholesterol, glucose, increase exercise.  Go to the ER if any chest pain, shortness of breath, nausea, dizziness.  Morbid obesity ([place]) - follow up 3 months for progress monitoring - increase veggies, decrease carbs - long discussion about weight loss, diet, and exercise  Labile hypertension - continue medications, DASH diet, exercise and monitor at home. Call if greater than 130/80.  -     CBC with Diff -     COMPLETE METABOLIC PANEL WITH GFR -     Microalbumin / Creatinine Urine Ratio  Hypothyroidism, unspecified type -     TSH Hypothyroidism-check TSH level, continue medications the same, reminded to take on an empty stomach 30-82mns before food.   Medication management Continued  Vitamin D deficiency Continue Vit D supplementation to maintain value in therapeutic level of 60-100   History of tobacco use -23 pack year Monitor, had benign CT 06/29/22- benign repeat in 1 year  Flu Vaccine  need High dose flu vaccine given     Continue diet and meds as discussed. Further disposition pending results of labs. Over 30 minutes of face to face interview, exam, counseling, chart review, and critical decision making was performed  Future Appointments  Date Time Provider [place] 08/31/2022  1:15 PM GMarzetta Board DPM [facility]  09/15/2022  1:15 PM CDelice Bison LCharlestine Massed NP [facility] None  11/07/2022  2:00 PM GMarzetta Board DPM [facility]  11/08/2022  2:00 PM WAlycia Rossetti NP [facility] None  12/15/2022  1:45 PM GNicholas Lose MD [facility] None  02/09/2023  2:30 PM WAlycia Rossetti NP [facility] None    HPI 76y.o. female  presents for Medicare annual wellness and 3 month follow up. She has Diverticulosis of large intestine; History of tobacco use; Hypothyroidism; Controlled type 2 diabetes mellitus with complication, without long-term current use of insulin ([place]; Morbid obesity ([place]; Chronic venous insufficiency; Peripheral autonomic neuropathy of unknown cause; Labile hypertension; History of basal cell carcinoma (BCC); Osteoarthritis of knee; Atherosclerosis of aorta ([place] by CT scan in 2019; Vitamin D deficiency; Medication management; Gastroesophageal reflux disease with esophagitis; Type 2 diabetes mellitus with hyperlipidemia ([place]; CKD stage 3 secondary to diabetes ([place]; Family history of heart disease; CAD (coronary artery disease); and Malignant neoplasm of upper-outer quadrant of right breast in female, estrogen receptor positive ([place] on their problem list.  She is widowed, 3 grandchildren, 1 great grandson - 16 months  She is retired from [employer]-   She had lumpectomy 02/24/22 for right  breast Invasive moderately differentiated  Ductal Carcinoma Intermediate.  She underwent a recent  lumpectomy which showed grade 2 IDC 8 mm with DCIS margins negative.  She presents to the clinic today for a follow-up to discuss  adjuvant treatment plan. She is on Anastrozole for 5 years. Follow with Dr. Lindi Adie.   She is former smoker - 23.5 pack year hisotry, quit 2015, strong family hx of llung cancer, she had CT chest for coronary in 06/29/22 1. Lung-RADS 2S, benign appearance or behavior. Continue annual screening with low-dose chest CT without contrast in 12 months.Marland Kitchen   BMI is Body mass index is 35.08 kg/m., she is working on diet and exercise, trying to watch what she eats and smaller portions. Down from 246 lb in 07/2018.  Wt Readings from Last 3 Encounters:  08/09/22 224 lb (101.6 kg)  07/07/22 225 lb 11.2 oz (102.4 kg)  06/13/22 222 lb 12.8 oz (101.1 kg)   She is following with Dr. Andree Elk for CAD; in 01/2020 she had cardiac CT 12/27/19  showing CCS of 693, 78th % for age/gender, Calcified plaque causing moderate stenosis in the prox LAD, distal RCA, proximal LCx, moderate stenosis, and also aortic atherosclerosis   Her blood pressure has been controlled at home, has been good, today their BP is BP: 104/62  BP Readings from Last 3 Encounters:  08/09/22 104/62  07/07/22 102/60  06/13/22 (!) 117/58     She does workout. She denies chest pain, shortness of breath, dizziness.  She has been working on diet and exercise for Diabetes, doing lifestyle changes  with diabetic chronic kidney disease she is on ACE/ARB With hyperlipidemia IS at goal less than 70, she taking atorvastatin 40 mg and ezetimibe 10 mg  she is on bASA Has idiopathic paraesthsias and is on gabapentin- follows with Dr. Adah Perl every 90 days, no current lesions on feet Eye exam Dr. Nicki Reaper - saw recently, report requested  denies polydipsia, polyuria and visual disturbances.   Last A1C was:  Lab Results  Component Value Date   HGBA1C 5.9 (H) 05/03/2022   Lab Results  Component Value Date   EGFR 59 (L) 05/03/2022    Lab Results  Component Value Date   CHOL 136 05/03/2022   HDL 50 05/03/2022   LDLCALC 58 05/03/2022   TRIG  213 (H) 05/03/2022   CHOLHDL 2.7 05/03/2022   Patient is on Vitamin D supplement.   Lab Results  Component Value Date   VD25OH 69 05/03/2022     She is on thyroid medication. Her medication was changed last visit to Levothyroxine 100 mcg daily Lab Results  Component Value Date   TSH 1.33 05/03/2022  .     Current Medications:   Current Outpatient Medications (Endocrine & Metabolic):    levothyroxine (SYNTHROID) 100 MCG tablet, Take  1 whole  tablet  Daily  on an empty stomach with only water for 30 minutes & no Antacid meds, Calcium or Magnesium for 4 hours & avoid Biotin  Current Outpatient Medications (Cardiovascular):    atorvastatin (LIPITOR) 40 MG tablet, Take 1 tablet Daily  for Cholesterol   ezetimibe (ZETIA) 10 MG tablet, Take  1 tablet  Daily  for Cholesterol   furosemide (LASIX) 40 MG tablet, Take 1 tablet 1 or  2 x /day for BP & Fluid Retention  Current Outpatient Medications (Respiratory):    pseudoephedrine (SUDAFED) 120 MG 12 hr tablet, Take   1 tablet    2 x /day (every 12 hours)    for Sinus & Chest Congestion  Current Outpatient Medications (Analgesics):  acetaminophen (TYLENOL) 650 MG CR tablet, Take 650 mg by mouth every 8 (eight) hours as needed for pain.   aspirin 81 MG tablet, Take 81 mg by mouth daily.   traMADol (ULTRAM) 50 MG tablet, Take by mouth every 6 (six) hours as needed. PRN  Current Outpatient Medications (Hematological):    cyanocobalamin 1000 MCG tablet, Take 1,000 mcg by mouth daily.  Current Outpatient Medications (Other):    anastrozole (ARIMIDEX) 1 MG tablet, Take 1 tablet (1 mg total) by mouth daily.   Biotin 5000 MCG CHEW, Chew 5,000 mcg by mouth daily.   Calcium Carbonate-Vit D-Min (CALCIUM 1200 PO), Take by mouth.   cholecalciferol (VITAMIN D3) 25 MCG (1000 UNIT) tablet, Take 3,000 Units by mouth daily.   diclofenac Sodium (VOLTAREN) 1 % GEL, Apply 4 g topically 4 (four) times daily. (Patient taking differently: Apply 4 g  topically 4 (four) times daily as needed (pain).)   dicyclomine (BENTYL) 20 MG tablet, TAKE 1 TABLET BY MOUTH THREE TIMES DAILY AS NEEDED FOR NAUSEA , BLOATING, CRAMPING OR DIARRHEA   dorzolamide-timolol (COSOPT) 22.3-6.8 MG/ML ophthalmic solution, Place 1 drop into both eyes 2 (two) times daily.   famotidine (PEPCID) 40 MG tablet, Take 40 mg by mouth daily.   fluconazole (DIFLUCAN) 150 MG tablet, Take 1 tablet 2 x /week  if needed for yeast infection   gabapentin (NEURONTIN) 800 MG tablet, Take  1 capsule 3 x /day - meals &  2 capsules  at Bedtime for Chronic Pain & Hot Flashes   Krill Oil 500 MG CAPS, Take by mouth.   Multiple Vitamin (MULTIVITAMIN) tablet, Take 1 tablet by mouth daily.   mupirocin ointment (BACTROBAN) 2 %, Apply to left 3rd toe once daily   ondansetron (ZOFRAN) 4 MG tablet, Take  1 tablet  3 x /day  if needed for nausea & Vomiting   OVER THE COUNTER MEDICATION, Apply 1 application. topically daily as needed (pain). Stop Pain   OVER THE COUNTER MEDICATION, Elimi Fat   4 gummies   pantoprazole (PROTONIX) 40 MG tablet, Take  1 tablet  Daily  to Prevent Heartburn & Indigestion   Probiotic Product (PROBIOTIC DAILY PO), Take 1 capsule by mouth daily.  Medical History:  Past Medical History:  Diagnosis Date   Anemia    Arthritis    Chest tightness    Fatigue    GERD (gastroesophageal reflux disease)    Heart palpitations 12/06/2019   Hyperlipidemia    Hypothyroid    Internal hemorrhoids 05/04/2009   Qualifier: Diagnosis of  By: Nelson-Smith CMA (AAMA), Dottie     Melanoma [facility]) 2013   Excision/surgery only. No other treatment   Myalgia    Neuromuscular disorder ([place])    Neuropathy   Obesity    Pre-diabetes    Prediabetes    Ulcer    Varicose veins    Health Maintenance:   Immunization History  Administered Date(s) Administered   Influenza, High Dose Seasonal PF 08/19/2014, 09/10/2015, 07/26/2016, 08/10/2017, 08/14/2018, 08/27/2019, 09/10/2020, 10/12/2021    Pneumococcal Conjugate-13 05/12/2014   Pneumococcal Polysaccharide-23 09/05/2007, 06/02/2015   Preventative care: Last colonoscopy: 2011, Past Due 2021,   Last mammogram: 01/05/22 Last pap smear/pelvic exam: 02/2011- declines another DEXA: 03/24/22 normal  Heart cath June 1999, no blockages, coronary spasms  Prior vaccinations: TD or Tdap: ALLERGY- just swollen arm  Influenza: 07/2022 Pneumococcal: 2016 Prevnar13: 2015  Shingrix 45 dollars a shot, discussed. Covid 19: plans to get through [facility]  Personal history of 06/2020  Derm follows skin surgery center, hx of melanoma to [facility], goes annually, last 2021 Dr. Nicki Reaper for eye care, checking for glaucoma, 07/2022 Dr. Johney Maine Dentist  Patient Care Team: Unk Pinto, MD as PCP - General (Internal Medicine) Buford Dresser, MD as PCP - Cardiology (Cardiology) Macarthur Critchley, [place] as Referring Physician (Optometry) Delfina Redwood as Referring Physician (Physician Assistant) [name], MD as Consulting Physician (General Surgery) Nicholas Lose, MD as Consulting Physician (Hematology and Oncology) Eppie Gibson, MD as Attending Physician (Radiation Oncology)  Allergies Allergies  Allergen Reactions   Procaine Hcl Shortness Of Breath    Ok to use sq lidocaine for iv starts   Ciprofloxacin     Unknown reaction   Macrobid [Nitrofurantoin Macrocrystal]     Unknown reaction   Sulfonamide Derivatives Hives   Tetanus Toxoid Swelling    REACTION: arm swells   Tuberculin Ppd Swelling   Penicillins Rash    SURGICAL HISTORY She  has a past surgical history that includes Abdominal hysterectomy; Tonsillectomy and adenoidectomy; Cataract extraction (Bilateral); Nasal sinus surgery; Rotator cuff repair (Left, more than 7 years ago); Cyst removal hand (Left, 07/30/2018); and Breast lumpectomy with radioactive seed localization (Right, 02/24/2022). FAMILY HISTORY Her family history includes Breast cancer in her cousin;  Coronary artery disease (age of onset: 76) in her father; Dementia in her maternal aunt and mother; Lung cancer in her maternal grandmother and mother. SOCIAL HISTORY She  reports that she quit smoking about 9 years ago. Her smoking use included cigarettes. She started smoking about 56 years ago. She has a 25.00 pack-year smoking history. She has never used smokeless tobacco. She reports that she does not currently use alcohol. She reports that she does not use drugs.   Review of Systems:  Review of Systems  Constitutional:  Negative for malaise/fatigue and weight loss.  HENT:  Positive for tinnitus. Negative for hearing loss.   Eyes:  Negative for blurred vision and double vision.  Respiratory:  Negative for cough, sputum production, shortness of breath and wheezing.   Cardiovascular:  Negative for chest pain, palpitations, orthopnea, claudication, leg swelling and PND.  Gastrointestinal:  Negative for abdominal pain, blood in stool, constipation, diarrhea, heartburn, melena, nausea and vomiting.  Genitourinary: Negative.   Musculoskeletal:  Positive for joint pain (knees). Negative for falls and myalgias.  Skin:  Negative for rash.  Neurological:  Positive for tingling (bil feet). Negative for dizziness, sensory change, weakness and headaches.  Endo/Heme/Allergies:  Negative for polydipsia.  Psychiatric/Behavioral: Negative.  Negative for depression, memory loss, substance abuse and suicidal ideas. The patient is not nervous/anxious and does not have insomnia.   All other systems reviewed and are negative.   Physical Exam: BP 104/62   Pulse (!) 58   Temp (!) 97.5 F (36.4 C)   Ht '5\' 7"'  (1.702 m)   Wt 224 lb (101.6 kg)   SpO2 95%   BMI 35.08 kg/m  Wt Readings from Last 3 Encounters:  08/09/22 224 lb (101.6 kg)  07/07/22 225 lb 11.2 oz (102.4 kg)  06/13/22 222 lb 12.8 oz (101.1 kg)   General Appearance: Well nourished, in no apparent distress. Eyes: PERRLA, EOMs, conjunctiva no  swelling or erythema Sinuses: No Frontal/maxillary tenderness ENT/Mouth: Ext aud canals clear, TMs without erythema, bulging. No erythema, swelling, or exudate on post pharynx.  Tonsils not swollen or erythematous. Hearing normal.  Neck: Supple, thyroid normal.  Respiratory: Respiratory effort normal, BS equal bilaterally without rales, rhonchi, wheezing or stridor.  Cardio:  RRR with no MRGs. Brisk peripheral pulses with mild edema bil lower legs  Abdomen: Soft, + BS,  Non tender, no guarding, rebound, hernias, masses. Lymphatics: Non tender without lymphadenopathy.  Musculoskeletal: Full ROM, 5/5 strength, normal steady gait Skin:  Warm, dry without rashes, lesions, ecchymosis.  Neuro: Cranial nerves intact. Normal muscle tone, no cerebellar symptoms.Bil feet with diminished sensation throughout to monofilament Psych: Awake and oriented X 3, normal affect, Insight and Judgment appropriate.      Alycia Rossetti, NP 3:30PM [facility]

## 2022-08-09 ENCOUNTER — Encounter: Payer: Self-pay | Admitting: Nurse Practitioner

## 2022-08-09 ENCOUNTER — Ambulatory Visit (INDEPENDENT_AMBULATORY_CARE_PROVIDER_SITE_OTHER): Payer: PPO | Admitting: Nurse Practitioner

## 2022-08-09 ENCOUNTER — Encounter (HOSPITAL_BASED_OUTPATIENT_CLINIC_OR_DEPARTMENT_OTHER): Payer: Self-pay | Admitting: Cardiology

## 2022-08-09 VITALS — BP 104/62 | HR 58 | Temp 97.5°F | Ht 67.0 in | Wt 224.0 lb

## 2022-08-09 DIAGNOSIS — E118 Type 2 diabetes mellitus with unspecified complications: Secondary | ICD-10-CM | POA: Diagnosis not present

## 2022-08-09 DIAGNOSIS — I10 Essential (primary) hypertension: Secondary | ICD-10-CM

## 2022-08-09 DIAGNOSIS — I2510 Atherosclerotic heart disease of native coronary artery without angina pectoris: Secondary | ICD-10-CM | POA: Diagnosis not present

## 2022-08-09 DIAGNOSIS — E785 Hyperlipidemia, unspecified: Secondary | ICD-10-CM

## 2022-08-09 DIAGNOSIS — I700 Atherosclerosis of aorta: Secondary | ICD-10-CM

## 2022-08-09 DIAGNOSIS — Z85828 Personal history of other malignant neoplasm of skin: Secondary | ICD-10-CM | POA: Diagnosis not present

## 2022-08-09 DIAGNOSIS — E1122 Type 2 diabetes mellitus with diabetic chronic kidney disease: Secondary | ICD-10-CM | POA: Diagnosis not present

## 2022-08-09 DIAGNOSIS — E1169 Type 2 diabetes mellitus with other specified complication: Secondary | ICD-10-CM | POA: Diagnosis not present

## 2022-08-09 DIAGNOSIS — N183 Chronic kidney disease, stage 3 unspecified: Secondary | ICD-10-CM

## 2022-08-09 DIAGNOSIS — C50411 Malignant neoplasm of upper-outer quadrant of right female breast: Secondary | ICD-10-CM | POA: Diagnosis not present

## 2022-08-09 DIAGNOSIS — N1831 Chronic kidney disease, stage 3a: Secondary | ICD-10-CM

## 2022-08-09 DIAGNOSIS — E039 Hypothyroidism, unspecified: Secondary | ICD-10-CM | POA: Diagnosis not present

## 2022-08-09 DIAGNOSIS — Z23 Encounter for immunization: Secondary | ICD-10-CM

## 2022-08-10 LAB — COMPLETE METABOLIC PANEL WITH GFR
AG Ratio: 1.8 (calc) (ref 1.0–2.5)
ALT: 18 U/L (ref 6–29)
AST: 19 U/L (ref 10–35)
Albumin: 4.3 g/dL (ref 3.6–5.1)
Alkaline phosphatase (APISO): 97 U/L (ref 37–153)
BUN/Creatinine Ratio: 20 (calc) (ref 6–22)
BUN: 20 mg/dL (ref 7–25)
CO2: 27 mmol/L (ref 20–32)
Calcium: 9.5 mg/dL (ref 8.6–10.4)
Chloride: 105 mmol/L (ref 98–110)
Creat: 1.01 mg/dL — ABNORMAL HIGH (ref 0.60–1.00)
Globulin: 2.4 g/dL (calc) (ref 1.9–3.7)
Glucose, Bld: 95 mg/dL (ref 65–99)
Potassium: 4.3 mmol/L (ref 3.5–5.3)
Sodium: 143 mmol/L (ref 135–146)
Total Bilirubin: 1 mg/dL (ref 0.2–1.2)
Total Protein: 6.7 g/dL (ref 6.1–8.1)
eGFR: 58 mL/min/{1.73_m2} — ABNORMAL LOW (ref >=60)

## 2022-08-10 LAB — MICROALBUMIN / CREATININE URINE RATIO
Creatinine, Urine: 81 mg/dL (ref 20–275)
Microalb Creat Ratio: 16 mcg/mg creat (ref <30)
Microalb, Ur: 1.3 mg/dL

## 2022-08-10 LAB — CBC WITH DIFFERENTIAL/PLATELET
Absolute Monocytes: 643 cells/uL (ref 200–950)
Basophils Absolute: 41 cells/uL (ref 0–200)
Basophils Relative: 0.4 %
Eosinophils Absolute: 102 cells/uL (ref 15–500)
Eosinophils Relative: 1 %
HCT: 40.5 % (ref 35.0–45.0)
Hemoglobin: 13.5 g/dL (ref 11.7–15.5)
Lymphs Abs: 2958 cells/uL (ref 850–3900)
MCH: 29 pg (ref 27.0–33.0)
MCHC: 33.3 g/dL (ref 32.0–36.0)
MCV: 87.1 fL (ref 80.0–100.0)
MPV: 10.6 fL (ref 7.5–12.5)
Monocytes Relative: 6.3 %
Neutro Abs: 6457 cells/uL (ref 1500–7800)
Neutrophils Relative %: 63.3 %
Platelets: 271 10*3/uL (ref 140–400)
RBC: 4.65 10*6/uL (ref 3.80–5.10)
RDW: 13.2 % (ref 11.0–15.0)
Total Lymphocyte: 29 %
WBC: 10.2 10*3/uL (ref 3.8–10.8)

## 2022-08-10 LAB — HEMOGLOBIN A1C
Hgb A1c MFr Bld: 6 % of total Hgb — ABNORMAL HIGH (ref <5.7)
Mean Plasma Glucose: 126 mg/dL
eAG (mmol/L): 7 mmol/L

## 2022-08-10 LAB — LIPID PANEL
Cholesterol: 131 mg/dL (ref <200)
HDL: 46 mg/dL — ABNORMAL LOW (ref >=50)
LDL Cholesterol (Calc): 60 mg/dL (calc)
Non-HDL Cholesterol (Calc): 85 mg/dL (calc) (ref <130)
Total CHOL/HDL Ratio: 2.8 (calc) (ref <5.0)
Triglycerides: 170 mg/dL — ABNORMAL HIGH (ref <150)

## 2022-08-10 LAB — TSH: TSH: 0.56 mIU/L (ref 0.40–4.50)

## 2022-08-31 ENCOUNTER — Ambulatory Visit: Payer: PPO | Admitting: Podiatry

## 2022-08-31 ENCOUNTER — Encounter: Payer: Self-pay | Admitting: Podiatry

## 2022-08-31 DIAGNOSIS — M79675 Pain in left toe(s): Secondary | ICD-10-CM | POA: Diagnosis not present

## 2022-08-31 DIAGNOSIS — Z8631 Personal history of diabetic foot ulcer: Secondary | ICD-10-CM

## 2022-08-31 DIAGNOSIS — E1142 Type 2 diabetes mellitus with diabetic polyneuropathy: Secondary | ICD-10-CM | POA: Diagnosis not present

## 2022-08-31 DIAGNOSIS — L84 Corns and callosities: Secondary | ICD-10-CM

## 2022-08-31 DIAGNOSIS — M79674 Pain in right toe(s): Secondary | ICD-10-CM

## 2022-08-31 DIAGNOSIS — B351 Tinea unguium: Secondary | ICD-10-CM | POA: Diagnosis not present

## 2022-08-31 NOTE — Progress Notes (Signed)
Subjective:  Patient ID: [name], female    DOB: 1946/11/04,  MRN: [id number]  [name] presents to clinic today for: at risk foot care. Patient has h/o ulcer left 3rd toe. She saw Dr. Sherryle Lis and ulcer has resolved. She voices no new pedal problems on today's visit.  Chief Complaint  Patient presents with   Nail Problem    Routine foot care PCP-[place] PCP VST-07/2022   New problem(s): None.   PCP is Unk Pinto, MD , and last visit was  May 03, 2022.  Allergies  Allergen Reactions   Procaine Hcl Shortness Of Breath    Ok to use sq lidocaine for iv starts   Ciprofloxacin     Unknown reaction   Macrobid [Nitrofurantoin Macrocrystal]     Unknown reaction   Sulfonamide Derivatives Hives   Tetanus Toxoid Swelling    REACTION: arm swells   Tuberculin Ppd Swelling   Penicillins Rash    Review of Systems: Negative except as noted in the HPI.  Objective: No changes noted in today's physical examination.  [name] is a pleasant 76 y.o. female in NAD. AAO x 3. Vascular CFT <3 seconds b/l LE. Palpable DP pulse(s) b/l LE. Palpable PT pulse(s) b/l LE. No pain with calf compression b/l. Lower extremity skin temperature gradient within normal limits. +1 pitting edema noted BLE. Evidence of chronic venous insufficiency b/l LE. No ischemia or gangrene noted b/l LE. No cyanosis or clubbing noted b/l LE.  Neurologic Normal speech. Oriented to person, place, and time. Pt has subjective symptoms of neuropathy. Protective sensation decreased with 10 gram monofilament b/l.  Dermatologic Skin warm and supple b/l. LE. No interdigital macerations noted b/l LE.   Left 3rd toe with hyperkeratotic lesion distal tip with underlying fluctuance. Erythema noted at distal phalanx. Noted blood blister with partial thickness ulceration measuring 1.0 cm in diameter.  Toenails 1-5 b/l elongated, discolored, dystrophic, thickened, crumbly with subungual debris and tenderness to dorsal  palpation.   Hyperkeratotic lesion(s) bilateral 2nd toes, b/l hallux, and submet head 5 left foot.  No erythema, no edema, no drainage, no fluctuance.   Preulcerative lesions of the distal tip of b/l 3rd digits. There is visible subdermal hemorrhage. There is no surrounding erythema, no edema, no drainage, no odor, no fluctuance.   Orthopedic: HAV with bunion deformity noted b/l LE. Severely contracted hammertoe deformity noted 1-5 bilaterally.   Assessment/Plan: 1. Pain due to onychomycosis of toenails of both feet   2. Healed diabetic foot ulcer   3. Corns and callosities   4. Pre-ulcerative corn or callous   5. Diabetic peripheral neuropathy associated with type 2 diabetes mellitus (HCC)     No orders of the defined types were placed in this encounter.   -Consent given for treatment as described below: -Examined patient. -Ulcer left 3rd digit has healed. Continue toe crest daily. Patient has seen Dr. Sherryle Lis and agreement made to continue to monitor and perform routine foot care with digital padding. -Continue diabetic foot care principles: inspect feet daily, monitor glucose as recommended by PCP and/or Endocrinologist, and follow prescribed diet per PCP, Endocrinologist and/or dietician. -Mycotic toenails 1-5 bilaterally were debrided in length and girth with sterile nail nippers and dremel without incident. -Corn(s) bilateral 2nd toes and callus(es) bilateral great toes and submet head 5 left foot were pared utilizing sterile scalpel blade without incident. Total number debrided =5. -Preulcerative lesion pared distal tip of left 3rd toe and distal tip of right 3rd toe  utilizing sterile scalpel blade. Total number pared=2. -Continue toe crests to afftected digits daily for protection. -Patient/POA to call should there be question/concern in the interim.   Return in about 9 weeks (around 11/02/2022).  Marzetta Board, DPM

## 2022-09-12 NOTE — Progress Notes (Signed)
Assessment and Plan:  [name] was seen today for insect bite.  Diagnoses and all orders for this visit:  Skin lesion of right lower extremity Appears to be possible allergic reaction- complete steroid taper If area does not resolve or worsens notify the office -     predniSONE (DELTASONE) 20 MG tablet; 3 tablets daily with food for 3 days, 2 tabs daily for 3 days, 1 tab a day for 5 days.   Hypothyroidism Please take your thyroid medication greater than 30 min before breakfast, separated by at least 4 hours  from antacids, calcium, iron, and multivitamins.     Further disposition pending results of labs. Discussed med's effects and SE's.   Over 30 minutes of exam, counseling, chart review, and critical decision making was performed.   Future Appointments  Date Time Provider [place]  09/13/2022 10:45 AM Alycia Rossetti, NP [facility] None  09/15/2022  1:15 PM Gardenia Phlegm, NP [facility] None  11/07/2022  2:00 PM Marzetta Board, DPM [facility]  11/08/2022  2:00 PM Alycia Rossetti, NP [facility] None  12/15/2022  1:45 PM Nicholas Lose, MD [facility] None  01/09/2023  1:45 PM Marzetta Board, DPM [facility]  02/09/2023  2:30 PM Alycia Rossetti, NP [facility] None    ------------------------------------------------------------------------------------------------------------------   HPI BP 120/60   Pulse (!) 54   Temp (!) 97.5 F (36.4 C)   Ht '5\' 7"'$  (1.702 m)   Wt 224 lb (101.6 kg)   SpO2 95%   BMI 35.08 kg/m   76 y.o.female presents for reddened area on her right ankle that she noticed 6 days ago. It was never itchy or irritated. The area is not painful.  It is on achilles tendon scar from repair on right ankle which was done in 2014   BP has been well controlled without medication. Denies headaches, chest pain, shortness of breath and dizziness BP Readings from Last 3 Encounters:  09/13/22 120/60  08/09/22 104/62   07/07/22 102/60   Her BS are mildly elevated, controlled currently without medication Lab Results  Component Value Date   HGBA1C 6.0 (H) 08/09/2022    She is currently on Levothyroxine 100 mcg daily.  Value is in range Lab Results  Component Value Date   TSH 0.56 08/09/2022     Past Medical History:  Diagnosis Date   Anemia    Arthritis    Chest tightness    Fatigue    GERD (gastroesophageal reflux disease)    Heart palpitations 12/06/2019   Hyperlipidemia    Hypothyroid    Internal hemorrhoids 05/04/2009   Qualifier: Diagnosis of  By: Nelson-Smith CMA (AAMA), Dottie     Melanoma [facility]) 2013   Excision/surgery only. No other treatment   Myalgia    Neuromuscular disorder (HCC)    Neuropathy   Obesity    Pre-diabetes    Prediabetes    Ulcer    Varicose veins      Allergies  Allergen Reactions   Procaine Hcl Shortness Of Breath    Ok to use sq lidocaine for iv starts   Ciprofloxacin     Unknown reaction   Macrobid [Nitrofurantoin Macrocrystal]     Unknown reaction   Sulfonamide Derivatives Hives   Tetanus Toxoid Swelling    REACTION: arm swells   Tuberculin Ppd Swelling   Penicillins Rash    Current Outpatient Medications on File Prior to Visit  Medication Sig   acetaminophen (TYLENOL) 650 MG CR tablet Take 650  mg by mouth every 8 (eight) hours as needed for pain.   anastrozole (ARIMIDEX) 1 MG tablet Take 1 tablet (1 mg total) by mouth daily.   aspirin 81 MG tablet Take 81 mg by mouth daily.   Biotin 5000 MCG CHEW Chew 5,000 mcg by mouth daily.   Calcium Carbonate-Vit D-Min (CALCIUM 1200 PO) Take by mouth.   cholecalciferol (VITAMIN D3) 25 MCG (1000 UNIT) tablet Take 3,000 Units by mouth daily.   cyanocobalamin 1000 MCG tablet Take 1,000 mcg by mouth daily.   diclofenac Sodium (VOLTAREN) 1 % GEL Apply 4 g topically 4 (four) times daily. (Patient taking differently: Apply 4 g topically 4 (four) times daily as needed (pain).)   dicyclomine (BENTYL) 20 MG  tablet TAKE 1 TABLET BY MOUTH THREE TIMES DAILY AS NEEDED FOR NAUSEA , BLOATING, CRAMPING OR DIARRHEA   dorzolamide-timolol (COSOPT) 22.3-6.8 MG/ML ophthalmic solution Place 1 drop into both eyes 2 (two) times daily.   ezetimibe (ZETIA) 10 MG tablet Take  1 tablet  Daily  for Cholesterol   famotidine (PEPCID) 40 MG tablet Take 40 mg by mouth daily.   fluconazole (DIFLUCAN) 150 MG tablet Take 1 tablet 2 x /week  if needed for yeast infection   furosemide (LASIX) 40 MG tablet Take 1 tablet 1 or  2 x /day for BP & Fluid Retention   gabapentin (NEURONTIN) 800 MG tablet Take  1 capsule 3 x /day - meals &  2 capsules  at Bedtime for Chronic Pain & Hot Flashes   Krill Oil 500 MG CAPS Take by mouth.   levothyroxine (SYNTHROID) 100 MCG tablet Take  1 whole  tablet  Daily  on an empty stomach with only water for 30 minutes & no Antacid meds, Calcium or Magnesium for 4 hours & avoid Biotin   Multiple Vitamin (MULTIVITAMIN) tablet Take 1 tablet by mouth daily.   mupirocin ointment (BACTROBAN) 2 % Apply to left 3rd toe once daily   ondansetron (ZOFRAN) 4 MG tablet Take  1 tablet  3 x /day  if needed for nausea & Vomiting   OVER THE COUNTER MEDICATION Apply 1 application. topically daily as needed (pain). Stop Pain   OVER THE COUNTER MEDICATION Elimi Fat   4 gummies   pantoprazole (PROTONIX) 40 MG tablet Take  1 tablet  Daily  to Prevent Heartburn & Indigestion   Probiotic Product (PROBIOTIC DAILY PO) Take 1 capsule by mouth daily.   pseudoephedrine (SUDAFED) 120 MG 12 hr tablet Take   1 tablet    2 x /day (every 12 hours)    for Sinus & Chest Congestion   traMADol (ULTRAM) 50 MG tablet Take by mouth every 6 (six) hours as needed. PRN   No current facility-administered medications on file prior to visit.    ROS: all negative except above.   Physical Exam:  BP 120/60   Pulse (!) 54   Temp (!) 97.5 F (36.4 C)   Ht '5\' 7"'$  (1.702 m)   Wt 224 lb (101.6 kg)   SpO2 95%   BMI 35.08 kg/m   General  Appearance: Well nourished, in no apparent distress. Eyes: PERRLA, EOMs, conjunctiva no swelling or erythema Sinuses: No Frontal/maxillary tenderness ENT/Mouth: Ext aud canals clear, TMs without erythema, bulging. No erythema, swelling, or exudate on post pharynx.  Tonsils not swollen or erythematous. Hearing normal.  Neck: Supple, thyroid normal.  Respiratory: Respiratory effort normal, BS equal bilaterally without rales, rhonchi, wheezing or stridor.  Cardio: RRR with no  MRGs. Brisk peripheral pulses without edema.  Abdomen: Soft, + BS.  Non tender, no guarding, rebound, hernias, masses. Lymphatics: Non tender without lymphadenopathy.  Musculoskeletal: Full ROM, 5/5 strength, normal gait.  Skin: Warm, dry 2-3 cm erythematous area on right ankle, some small raised red papules surrounding  Neuro: Cranial nerves intact. Normal muscle tone, no cerebellar symptoms. Sensation intact.  Psych: Awake and oriented X 3, normal affect, Insight and Judgment appropriate.     Alycia Rossetti, NP 10:41 AM [facility]

## 2022-09-13 ENCOUNTER — Encounter: Payer: Self-pay | Admitting: Nurse Practitioner

## 2022-09-13 ENCOUNTER — Ambulatory Visit (INDEPENDENT_AMBULATORY_CARE_PROVIDER_SITE_OTHER): Payer: PPO | Admitting: Nurse Practitioner

## 2022-09-13 ENCOUNTER — Other Ambulatory Visit: Payer: Self-pay | Admitting: Internal Medicine

## 2022-09-13 VITALS — BP 120/60 | HR 54 | Temp 97.5°F | Ht 67.0 in | Wt 224.0 lb

## 2022-09-13 DIAGNOSIS — E785 Hyperlipidemia, unspecified: Secondary | ICD-10-CM

## 2022-09-13 DIAGNOSIS — E039 Hypothyroidism, unspecified: Secondary | ICD-10-CM

## 2022-09-13 DIAGNOSIS — L989 Disorder of the skin and subcutaneous tissue, unspecified: Secondary | ICD-10-CM

## 2022-09-13 MED ORDER — PREDNISONE 20 MG PO TABS: ORAL_TABLET | ORAL | 0 refills | Status: AC

## 2022-09-14 ENCOUNTER — Encounter: Payer: Self-pay | Admitting: Nurse Practitioner

## 2022-09-14 ENCOUNTER — Other Ambulatory Visit: Payer: Self-pay | Admitting: Internal Medicine

## 2022-09-14 ENCOUNTER — Other Ambulatory Visit: Payer: Self-pay | Admitting: Nurse Practitioner

## 2022-09-14 DIAGNOSIS — E1140 Type 2 diabetes mellitus with diabetic neuropathy, unspecified: Secondary | ICD-10-CM

## 2022-09-14 DIAGNOSIS — R232 Flushing: Secondary | ICD-10-CM

## 2022-09-14 MED ORDER — DEXAMETHASONE 1 MG PO TABS
ORAL_TABLET | ORAL | 0 refills | Status: DC
Start: 2022-09-14 — End: 2022-11-08

## 2022-09-14 MED ORDER — GABAPENTIN 800 MG PO TABS
800.0000 mg | ORAL_TABLET | Freq: Three times a day (TID) | ORAL | 1 refills | Status: DC
  Filled 2022-11-03: qty 450, 150d supply, fill #0

## 2022-09-15 ENCOUNTER — Ambulatory Visit: Payer: PPO | Admitting: Adult Health

## 2022-09-21 DIAGNOSIS — H40013 Open angle with borderline findings, low risk, bilateral: Secondary | ICD-10-CM | POA: Diagnosis not present

## 2022-09-29 ENCOUNTER — Inpatient Hospital Stay: Payer: PPO | Attending: Adult Health | Admitting: Adult Health

## 2022-09-29 ENCOUNTER — Encounter: Payer: Self-pay | Admitting: Adult Health

## 2022-09-29 VITALS — BP 135/58 | HR 57 | Temp 97.7°F | Resp 16 | Ht 67.0 in | Wt 224.0 lb

## 2022-09-29 DIAGNOSIS — N183 Chronic kidney disease, stage 3 unspecified: Secondary | ICD-10-CM | POA: Insufficient documentation

## 2022-09-29 DIAGNOSIS — Z79811 Long term (current) use of aromatase inhibitors: Secondary | ICD-10-CM | POA: Insufficient documentation

## 2022-09-29 DIAGNOSIS — I129 Hypertensive chronic kidney disease with stage 1 through stage 4 chronic kidney disease, or unspecified chronic kidney disease: Secondary | ICD-10-CM | POA: Insufficient documentation

## 2022-09-29 DIAGNOSIS — Z803 Family history of malignant neoplasm of breast: Secondary | ICD-10-CM | POA: Diagnosis not present

## 2022-09-29 DIAGNOSIS — E1122 Type 2 diabetes mellitus with diabetic chronic kidney disease: Secondary | ICD-10-CM | POA: Diagnosis not present

## 2022-09-29 DIAGNOSIS — Z85828 Personal history of other malignant neoplasm of skin: Secondary | ICD-10-CM | POA: Insufficient documentation

## 2022-09-29 DIAGNOSIS — Z87891 Personal history of nicotine dependence: Secondary | ICD-10-CM | POA: Diagnosis not present

## 2022-09-29 DIAGNOSIS — Z801 Family history of malignant neoplasm of trachea, bronchus and lung: Secondary | ICD-10-CM | POA: Diagnosis not present

## 2022-09-29 DIAGNOSIS — Z170 Estrogen receptor positive status [ER+]: Secondary | ICD-10-CM | POA: Insufficient documentation

## 2022-09-29 DIAGNOSIS — Z90710 Acquired absence of both cervix and uterus: Secondary | ICD-10-CM | POA: Diagnosis not present

## 2022-09-29 DIAGNOSIS — C50411 Malignant neoplasm of upper-outer quadrant of right female breast: Secondary | ICD-10-CM | POA: Diagnosis not present

## 2022-09-29 NOTE — Progress Notes (Signed)
[facility] Follow up:    [name], [name] [address] [address]   DIAGNOSIS:  Cancer Staging  Malignant neoplasm of upper-outer quadrant of right breast in female, estrogen receptor positive ([place]) Staging form: Breast, AJCC 8th Edition - Clinical: Stage IA (cT1b, cN0, cM0, G2, ER+, PR+, HER2-) - Signed by Nicholas Lose, MD on 02/02/2022 Stage prefix: Initial diagnosis Histologic grading system: 3 grade system   SUMMARY OF ONCOLOGIC HISTORY: Oncology History  Malignant neoplasm of upper-outer quadrant of right breast in female, estrogen receptor positive ([place])  02/01/2022 Initial Diagnosis   Screening mammogram detected 0.6 cm right breast mass.  By ultrasound it measured 0.4 cm.  Stereotactic biopsy revealed grade 2 IDC and intermediate grade DCIS ER 100%, PR 100%, HER2 negative, Ki-67 10%   02/02/2022 Cancer Staging   Staging form: Breast, AJCC 8th Edition - Clinical: Stage IA (cT1b, cN0, cM0, G2, ER+, PR+, HER2-) - Signed by Nicholas Lose, MD on 02/02/2022 Stage prefix: Initial diagnosis Histologic grading system: 3 grade system   02/24/2022 Surgery   02/24/2022: Right lumpectomy: Grade 2 IDC 8 mm (2 contiguous blocks), intermediate grade DCIS, margins negative, ER 100%, PR 100%, HER2 negative, Ki-67 10%   03/15/2022 -  Anti-estrogen oral therapy   Anastrozole x  5 years     CURRENT THERAPY: Anastrozole  INTERVAL HISTORY: [name] 76 y.o. female returns for both her history of breast cancer.  She is taking letrozole daily.  She underwent bone density testing on Mar 24, 2022 which showed a T score of -0.9 which is normal.  Her next mammogram is due in March 2023.  [name] also underwent lung cancer screening on July 01, 2022 which was benign and annual lung cancer screening was recommended to be continued in August 2024.  She is taking Anastrozole daily.  She denies any significant issues other than feeling hungry from  the medication.    Patient Active Problem List   Diagnosis Date Noted   Malignant neoplasm of upper-outer quadrant of right breast in female, estrogen receptor positive ([place]) 02/01/2022   CAD (coronary artery disease) 01/22/2020   Family history of heart disease 12/06/2019   Type 2 diabetes mellitus with hyperlipidemia ([place]) 10/09/2019   CKD stage 3 secondary to diabetes ([place]) 10/09/2019   Gastroesophageal reflux disease with esophagitis 04/01/2019   Vitamin D deficiency 12/03/2018   Medication management 12/03/2018   Atherosclerosis of aorta ([place]) by CT scan in 2019 08/06/2018   Osteoarthritis of knee 06/12/2018   History of basal cell carcinoma (BCC) 08/10/2017   Labile hypertension 02/27/2017   Chronic venous insufficiency 06/02/2015   Peripheral autonomic neuropathy of unknown cause 06/02/2015   Morbid obesity ([place]) 08/19/2014   Hypothyroidism    Controlled type 2 diabetes mellitus with complication, without long-term current use of insulin ([place])    History of tobacco use 04/09/2012   Diverticulosis of large intestine 05/04/2009    is allergic to procaine hcl, ciprofloxacin, macrobid [nitrofurantoin macrocrystal], sulfonamide derivatives, tetanus toxoid, tuberculin ppd, and penicillins.  MEDICAL HISTORY: Past Medical History:  Diagnosis Date   Anemia    Arthritis    Chest tightness    Fatigue    GERD (gastroesophageal reflux disease)    Heart palpitations 12/06/2019   Hyperlipidemia    Hypothyroid    Internal hemorrhoids 05/04/2009   Qualifier: Diagnosis of  By: Nelson-Smith CMA (AAMA), Dottie     Melanoma [facility]) 2013   Excision/surgery only. No other treatment  Myalgia    Neuromuscular disorder (HCC)    Neuropathy   Obesity    Pre-diabetes    Prediabetes    Ulcer    Varicose veins     SURGICAL HISTORY: Past Surgical History:  Procedure Laterality Date   ABDOMINAL HYSTERECTOMY     Total    BREAST LUMPECTOMY WITH RADIOACTIVE SEED LOCALIZATION Right  02/24/2022   Procedure: RIGHT BREAST LUMPECTOMY WITH RADIOACTIVE SEED LOCALIZATION;  Surgeon: [name], MD;  Location: [facility];  Service: General;  Laterality: Right;   CATARACT EXTRACTION Bilateral    Dr. Tommy Rainwater   CYST REMOVAL HAND Left 07/30/2018   NASAL SINUS SURGERY     ROTATOR CUFF REPAIR Left more than 7 years ago   Dr. Percell Miller and Dr. 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  Widow.     Social Determinants of Health   Financial Resource Strain: Not on file  Food Insecurity: Not on file  Transportation Needs: Not on file  Physical Activity: Not on file  Stress: Not on file  Social Connections: Not on file  Intimate Partner Violence: Not on file    FAMILY HISTORY: Family History  Problem Relation Age of Onset   Dementia Mother    Lung cancer Mother        dx 64s; smoking hx   Coronary artery disease Father 52   Lung cancer Maternal Grandmother        non-smoker; dx 88s   Dementia Maternal Aunt    Breast cancer Cousin        maternal female cousin; dx 48s    Review of Systems  Constitutional:  Negative for appetite change, chills, fatigue, fever and unexpected weight change.  HENT:   Negative for hearing loss,  lump/mass and trouble swallowing.   Eyes:  Negative for eye problems and icterus.  Respiratory:  Negative for chest tightness, cough and shortness of breath.   Cardiovascular:  Negative for chest pain, leg swelling and palpitations.  Gastrointestinal:  Negative for abdominal distention, abdominal pain, constipation, diarrhea, nausea and vomiting.  Endocrine: Negative for hot flashes.  Genitourinary:  Negative for difficulty urinating.   Musculoskeletal:  Negative for arthralgias.  Skin:  Negative for itching and rash.  Neurological:  Negative for dizziness, extremity weakness, headaches and numbness.  Hematological:  Negative for adenopathy. Does not bruise/bleed easily.  Psychiatric/Behavioral:  Negative for depression. The patient is not nervous/anxious.       PHYSICAL EXAMINATION  ECOG PERFORMANCE STATUS: 1 - Symptomatic but completely ambulatory  Vitals:   09/29/22  1357  BP: (!) 135/58  Pulse: (!) 57  Resp: 16  Temp: 97.7 F (36.5 C)  SpO2: 96%    Physical Exam Constitutional:      General: She is not in acute distress.    Appearance: Normal appearance. She is not toxic-appearing.  HENT:     Head: Normocephalic and atraumatic.  Eyes:     General: No scleral icterus. Cardiovascular:     Rate and Rhythm: Normal rate and regular rhythm.     Pulses: Normal pulses.     Heart sounds: Normal heart sounds.  Pulmonary:     Effort: Pulmonary effort is normal.     Breath sounds: Normal breath sounds.  Chest:     Comments: Status post right breast lumpectomy no sign of local recurrence left breast is benign. Abdominal:     General: Abdomen is flat. Bowel sounds are normal. There is no distension.     Palpations: Abdomen is soft.     Tenderness: There is no abdominal tenderness.  Musculoskeletal:        General: No swelling.     Cervical back: Neck supple.  Lymphadenopathy:     Cervical: No cervical adenopathy.  Skin:    General: Skin is warm and dry.     Findings: No  rash.  Neurological:     General: No focal deficit present.     Mental Status: She is alert.  Psychiatric:        Mood and Affect: Mood normal.        Behavior: Behavior normal.     LABORATORY DATA: None for this visit  ASSESSMENT and THERAPY PLAN:   Malignant neoplasm of upper-outer quadrant of right breast in female, estrogen receptor positive ([place]) [name] is a 76 year old woman with history of stage Ia estrogen progesterone positive breast cancer diagnosed in March 2023 status postlumpectomy and beginning on anastrozole therapy in April 2023.  [name] was recommended to continue anastrozole therapy.  She has no clinical or radiographic signs of breast cancer recurrence.  I recommended that she undergo mammography in March 2024 when due.  She is also recommended to undergo continued annual lung cancer screening in August 2024.  [name] healthy diet and exercise.  She will return in 6 months for follow-up with Dr. Payton Mccallum.   All questions were answered. The patient knows to call the clinic with any problems, questions or concerns. We can certainly see the patient much sooner if necessary.  Total encounter time:20 minutes*in face-to-face visit time, chart review, lab review, care coordination, order entry, and documentation of the encounter time.    Brandi Bihari, NP 09/29/22 2:41 PM Medical Oncology and Hematology [facility] [place], [place] [address] Tel. [phone]    Fax. [phone]  *Total Encounter Time as defined by the Centers for Medicare and Medicaid Services includes, in addition to the face-to-face time of a patient visit (documented in the note above) non-face-to-face time: obtaining and reviewing outside history, ordering and reviewing medications, tests or procedures, care coordination (communications with other health care professionals or caregivers) and documentation in the medical record.

## 2022-09-29 NOTE — Assessment & Plan Note (Signed)
[name] is a 76 year old woman with history of stage Ia estrogen progesterone positive breast cancer diagnosed in March 2023 status postlumpectomy and beginning on anastrozole therapy in April 2023.  Demiyah was recommended to continue anastrozole therapy.  She has no clinical or radiographic signs of breast cancer recurrence.  I recommended that she undergo mammography in March 2024 when due.  She is also recommended to undergo continued annual lung cancer screening in August 2024.  [place] healthy diet and exercise.  She will return in 6 months for follow-up with Dr. Payton Mccallum.

## 2022-10-17 ENCOUNTER — Other Ambulatory Visit: Payer: Self-pay

## 2022-10-17 MED ORDER — FAMOTIDINE 40 MG PO TABS
40.0000 mg | ORAL_TABLET | Freq: Every day | ORAL | 2 refills | Status: DC
  Filled 2022-11-03 – 2023-01-25 (×3): qty 90, 90d supply, fill #0
  Filled 2023-04-25: qty 90, 90d supply, fill #1
  Filled 2023-07-28: qty 90, 90d supply, fill #2

## 2022-10-20 ENCOUNTER — Telehealth: Payer: Self-pay | Admitting: *Deleted

## 2022-10-20 NOTE — Telephone Encounter (Signed)
Received call from pt with complaint of lower extremity swelling despite being on p.o lasix's with PCP.  Pt denies shortness of breath at this time.  Per MD pt needing to stop anastrozole x2 weeks and f/u in our office.  Pt educated and verbalized understanding.

## 2022-10-21 DIAGNOSIS — C50911 Malignant neoplasm of unspecified site of right female breast: Secondary | ICD-10-CM | POA: Diagnosis not present

## 2022-10-28 DIAGNOSIS — N3000 Acute cystitis without hematuria: Secondary | ICD-10-CM | POA: Diagnosis not present

## 2022-11-01 ENCOUNTER — Other Ambulatory Visit (HOSPITAL_COMMUNITY): Payer: Self-pay

## 2022-11-03 ENCOUNTER — Other Ambulatory Visit (HOSPITAL_COMMUNITY): Payer: Self-pay

## 2022-11-03 ENCOUNTER — Other Ambulatory Visit: Payer: Self-pay

## 2022-11-03 MED ORDER — GABAPENTIN 800 MG PO TABS: 800.0000 mg | ORAL_TABLET | Freq: Three times a day (TID) | ORAL | 3 refills | Status: DC

## 2022-11-03 MED FILL — Atorvastatin Calcium Tab 40 MG (Base Equivalent): ORAL | 90 days supply | Qty: 90 | Fill #0 | Status: CN

## 2022-11-04 ENCOUNTER — Other Ambulatory Visit (HOSPITAL_BASED_OUTPATIENT_CLINIC_OR_DEPARTMENT_OTHER): Payer: Self-pay

## 2022-11-04 ENCOUNTER — Other Ambulatory Visit (HOSPITAL_COMMUNITY): Payer: Self-pay

## 2022-11-04 ENCOUNTER — Inpatient Hospital Stay: Payer: PPO | Attending: Adult Health | Admitting: Adult Health

## 2022-11-04 ENCOUNTER — Encounter: Payer: Self-pay | Admitting: Adult Health

## 2022-11-04 VITALS — BP 130/60 | HR 58 | Temp 97.7°F | Resp 14 | Ht 67.0 in | Wt 227.8 lb

## 2022-11-04 DIAGNOSIS — Z87891 Personal history of nicotine dependence: Secondary | ICD-10-CM | POA: Diagnosis not present

## 2022-11-04 DIAGNOSIS — Z801 Family history of malignant neoplasm of trachea, bronchus and lung: Secondary | ICD-10-CM | POA: Insufficient documentation

## 2022-11-04 DIAGNOSIS — Z90710 Acquired absence of both cervix and uterus: Secondary | ICD-10-CM | POA: Insufficient documentation

## 2022-11-04 DIAGNOSIS — Z170 Estrogen receptor positive status [ER+]: Secondary | ICD-10-CM | POA: Insufficient documentation

## 2022-11-04 DIAGNOSIS — E1122 Type 2 diabetes mellitus with diabetic chronic kidney disease: Secondary | ICD-10-CM | POA: Insufficient documentation

## 2022-11-04 DIAGNOSIS — Z79811 Long term (current) use of aromatase inhibitors: Secondary | ICD-10-CM | POA: Diagnosis not present

## 2022-11-04 DIAGNOSIS — C50411 Malignant neoplasm of upper-outer quadrant of right female breast: Secondary | ICD-10-CM | POA: Insufficient documentation

## 2022-11-04 DIAGNOSIS — Z803 Family history of malignant neoplasm of breast: Secondary | ICD-10-CM | POA: Insufficient documentation

## 2022-11-04 DIAGNOSIS — I129 Hypertensive chronic kidney disease with stage 1 through stage 4 chronic kidney disease, or unspecified chronic kidney disease: Secondary | ICD-10-CM | POA: Diagnosis not present

## 2022-11-04 DIAGNOSIS — N183 Chronic kidney disease, stage 3 unspecified: Secondary | ICD-10-CM | POA: Insufficient documentation

## 2022-11-04 MED ORDER — TAMOXIFEN CITRATE 20 MG PO TABS: 20.0000 mg | ORAL_TABLET | Freq: Every day | ORAL | 0 refills | Status: DC

## 2022-11-04 NOTE — Progress Notes (Unsigned)
[facility] Follow up:    [name], [address] [address]   DIAGNOSIS: Cancer Staging  Malignant neoplasm of upper-outer quadrant of right breast in female, estrogen receptor positive ([place]) Staging form: Breast, AJCC 8th Edition - Clinical: Stage IA (cT1b, cN0, cM0, G2, ER+, PR+, HER2-) - Signed by Nicholas Lose, MD on 02/02/2022 Stage prefix: Initial diagnosis Histologic grading system: 3 grade system   SUMMARY OF ONCOLOGIC HISTORY: Oncology History  Malignant neoplasm of upper-outer quadrant of right breast in female, estrogen receptor positive ([place])  02/01/2022 Initial Diagnosis   Screening mammogram detected 0.6 cm right breast mass.  By ultrasound it measured 0.4 cm.  Stereotactic biopsy revealed grade 2 IDC and intermediate grade DCIS ER 100%, PR 100%, HER2 negative, Ki-67 10%   02/02/2022 Cancer Staging   Staging form: Breast, AJCC 8th Edition - Clinical: Stage IA (cT1b, cN0, cM0, G2, ER+, PR+, HER2-) - Signed by Nicholas Lose, MD on 02/02/2022 Stage prefix: Initial diagnosis Histologic grading system: 3 grade system   02/24/2022 Surgery   02/24/2022: Right lumpectomy: Grade 2 IDC 8 mm (2 contiguous blocks), intermediate grade DCIS, margins negative, ER 100%, PR 100%, HER2 negative, Ki-67 10%   03/15/2022 -  Anti-estrogen oral therapy   Anastrozole x  5 years     CURRENT THERAPY: Anastrozole  INTERVAL HISTORY: [name] 76 y.o. female returns for f/u after stopping anastrozole on 11/30 due to lower extremity swelling despite being on PO lasix. She notes that with not taking the anastrozole her swelling is better by about 50%, she is sleeping better and she is no longer experiencing headaches.    Patient Active Problem List   Diagnosis Date Noted   Malignant neoplasm of upper-outer quadrant of right breast in female, estrogen receptor positive ([place]) 02/01/2022   CAD (coronary artery disease) 01/22/2020    Family history of heart disease 12/06/2019   Type 2 diabetes mellitus with hyperlipidemia ([place]) 10/09/2019   CKD stage 3 secondary to diabetes ([place]) 10/09/2019   Gastroesophageal reflux disease with esophagitis 04/01/2019   Vitamin D deficiency 12/03/2018   Medication management 12/03/2018   Atherosclerosis of aorta ([place]) by CT scan in 2019 08/06/2018   Osteoarthritis of knee 06/12/2018   History of basal cell carcinoma ([facility]) 08/10/2017   Labile hypertension 02/27/2017   Chronic venous insufficiency 06/02/2015   Peripheral autonomic neuropathy of unknown cause 06/02/2015   Morbid obesity ([place]) 08/19/2014   Hypothyroidism    Controlled type 2 diabetes mellitus with complication, without long-term current use of insulin ([place])    History of tobacco use 04/09/2012   Diverticulosis of large intestine 05/04/2009    is allergic to procaine hcl, ciprofloxacin, macrobid [nitrofurantoin macrocrystal], sulfonamide derivatives, tetanus toxoid, tuberculin ppd, and penicillins.  MEDICAL HISTORY: Past Medical History:  Diagnosis Date   Anemia    Arthritis    Chest tightness    Fatigue    GERD (gastroesophageal reflux disease)    Heart palpitations 12/06/2019   Hyperlipidemia    Hypothyroid    Internal hemorrhoids 05/04/2009   Qualifier: Diagnosis of  By: Nelson-Smith CMA (AAMA), Dottie     Melanoma [facility]) 2013   Excision/surgery only. No other treatment   Myalgia    Neuromuscular disorder ([place])    Neuropathy   Obesity    Pre-diabetes    Prediabetes    Ulcer    Varicose veins     SURGICAL HISTORY: Past Surgical History:  Procedure Laterality Date  ABDOMINAL HYSTERECTOMY     Total    BREAST LUMPECTOMY WITH RADIOACTIVE SEED LOCALIZATION Right 02/24/2022   Procedure: RIGHT BREAST LUMPECTOMY WITH RADIOACTIVE SEED LOCALIZATION;  Surgeon: [name], MD;  Location: [facility];  Service: General;  Laterality: Right;   CATARACT EXTRACTION Bilateral    Dr. Tommy Rainwater   CYST REMOVAL HAND  Left 07/30/2018   NASAL SINUS SURGERY     ROTATOR CUFF REPAIR Left more than 7 years ago   Dr. Percell Miller and Dr. 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  Widow.     Social Determinants of Health   Financial Resource Strain: Not on file  Food Insecurity: Not on file  Transportation Needs: Not on file  Physical Activity: Not on file  Stress: Not on file  Social Connections: Not on file  Intimate Partner Violence: Not on file    FAMILY HISTORY: Family History  Problem Relation Age of Onset   Dementia Mother    Lung cancer Mother        dx 14s; smoking hx   Coronary artery disease Father 42   Lung cancer Maternal Grandmother        non-smoker; dx 60s   Dementia Maternal Aunt    Breast cancer Cousin        maternal female cousin; dx 47s    Review of Systems  Constitutional:  Negative for appetite change, chills, fatigue, fever and unexpected weight change.  HENT:   Negative for hearing loss, lump/mass and trouble swallowing.   Eyes:  Negative for eye problems and icterus.  Respiratory:  Negative for chest tightness, cough and shortness of breath.   Cardiovascular:  Negative for chest pain, leg swelling and palpitations.   Gastrointestinal:  Negative for abdominal distention, abdominal pain, constipation, diarrhea, nausea and vomiting.  Endocrine: Negative for hot flashes.  Genitourinary:  Negative for difficulty urinating.   Musculoskeletal:  Negative for arthralgias.  Skin:  Negative for itching and rash.  Neurological:  Negative for dizziness, extremity weakness, headaches and numbness.  Hematological:  Negative for adenopathy. Does not bruise/bleed easily.  Psychiatric/Behavioral:  Negative for depression. The patient is not nervous/anxious.       PHYSICAL EXAMINATION  ECOG PERFORMANCE STATUS: 1 - Symptomatic but completely ambulatory  Vitals:   11/04/22 1408  BP: 130/60  Pulse: (!) 58  Resp: 14  Temp: 97.7 F (36.5 C)  SpO2: 98%    Physical Exam Constitutional:      General: She is not in acute distress.    Appearance: Normal appearance. She is  not toxic-appearing.  HENT:     Head: Normocephalic and atraumatic.  Eyes:     General: No scleral icterus. Cardiovascular:     Rate and Rhythm: Normal rate and regular rhythm.     Pulses: Normal pulses.     Heart sounds: Normal heart sounds.  Pulmonary:     Effort: Pulmonary effort is normal.     Breath sounds: Normal breath sounds.  Abdominal:     General: Abdomen is flat. Bowel sounds are normal. There is no distension.     Palpations: Abdomen is soft.     Tenderness: There is no abdominal tenderness.  Musculoskeletal:        General: Swelling present.     Cervical back: Neck supple.  Lymphadenopathy:     Cervical: No cervical adenopathy.  Skin:    General: Skin is warm and dry.     Findings: No rash.  Neurological:     General: No focal deficit present.     Mental Status: She is alert.  Psychiatric:        Mood and Affect: Mood normal.        Behavior: Behavior normal.     LABORATORY DATA:    ASSESSMENT and THERAPY PLAN:   No problem-specific Assessment & Plan notes found for this encounter.    All questions were  answered. The patient knows to call the clinic with any problems, questions or concerns. We can certainly see the patient much sooner if necessary.  Total encounter time:*** minutes*in face-to-face visit time, chart review, lab review, care coordination, order entry, and documentation of the encounter time.    Wilber Bihari, NP 11/04/22 2:46 PM [facility] [place], [place] [address] Tel. [phone]    Fax. [phone]  *Total Encounter Time as defined by the Centers for Medicare and Medicaid Services includes, in addition to the face-to-face time of a patient visit (documented in the note above) non-face-to-face time: obtaining and reviewing outside history, ordering and reviewing medications, tests or procedures, care coordination (communications with other health care professionals or caregivers) and documentation in the medical record.

## 2022-11-04 NOTE — Patient Instructions (Signed)
Exemestane Tablets What is this medication? EXEMESTANE (ex e MES tane) treats breast cancer. It works by decreasing the amount of estrogen hormone your body makes, which slows or stops breast cancer cells from spreading or growing. This medicine may be used for other purposes; ask your health care provider or pharmacist if you have questions. COMMON BRAND NAME(S): Aromasin What should I tell my care team before I take this medication? They need to know if you have any of these conditions: An unusual or allergic reaction to exemestane, other medications, foods, dyes, or preservatives Pregnant or trying to get pregnant Breastfeeding How should I use this medication? Take this medication by mouth with a glass of water. Take it as directed on the prescription label at the same time every day. Take it after a meal. Keep taking it unless your care team tells you to stop. Talk to your care team about the use of this medication in children. Special care may be needed. Overdosage: If you think you have taken too much of this medicine contact a poison control center or emergency room at once. NOTE: This medicine is only for you. Do not share this medicine with others. What if I miss a dose? If you miss a dose, skip it. Take your next dose at the normal time. Do not take extra or 2 doses at the same time to make up for the missed dose. What may interact with this medication? Certain medications for seizures, such as carbamazepine, phenobarbital, phenytoin Rifampin St. John's wort This list may not describe all possible interactions. Give your health care provider a list of all the medicines, herbs, non-prescription drugs, or dietary supplements you use. Also tell them if you smoke, drink alcohol, or use illegal drugs. Some items may interact with your medicine. What should I watch for while using this medication? Visit your care team for regular checks on your progress. If you experience hot flashes or  sweating while taking this medication, avoid alcohol, smoking, and caffeine. This may help to decrease these side effects. Using this medication for a long time may weaken your bones. The risk of bone fractures may be increased. Talk to your care team about your bone health. Talk to your care team if you may be pregnant. Serious birth defects can occur if you take this medication during pregnancy and for 1 month after the last dose. You will need a negative pregnancy test before starting this medication. Contraception is recommended while taking this medication and for 1 month after the last dose. Your care team can help you find the option that works for you. Do not breastfeed while taking this medication and for 1 month after the last dose. This medication may cause infertility. Talk to your care team if you are concerned about your fertility. What side effects may I notice from receiving this medication? Side effects that you should report to your care team as soon as possible: Allergic reactions--skin rash, itching, hives, swelling of the face, lips, tongue, or throat Side effects that usually do not require medical attention (report to your care team if they continue or are bothersome): Fatigue Headache Hot flashes Joint pain Nausea Sweating Trouble sleeping This list may not describe all possible side effects. Call your doctor for medical advice about side effects. You may report side effects to FDA at 1-800-FDA-1088. Where should I keep my medication? Keep out of the reach of children and pets. Store at room temperature between 20 and 25 degrees C (68  and 77 degrees F). Get rid of any unused medication after the expiration date. To get rid of medications that are no longer needed or have expired: Take the medication to a medication take-back program. Check with your pharmacy or law enforcement to find a location. If you cannot return the medication, ask your pharmacist or care team how  to get rid of this medication safely. NOTE: This sheet is a summary. It may not cover all possible information. If you have questions about this medicine, talk to your doctor, pharmacist, or health care provider.  2023 Elsevier/Gold Standard (2022-03-28 00:00:00)

## 2022-11-07 ENCOUNTER — Ambulatory Visit: Payer: PPO | Admitting: Podiatry

## 2022-11-07 VITALS — BP 127/51

## 2022-11-07 DIAGNOSIS — E1142 Type 2 diabetes mellitus with diabetic polyneuropathy: Secondary | ICD-10-CM

## 2022-11-07 DIAGNOSIS — L84 Corns and callosities: Secondary | ICD-10-CM

## 2022-11-07 DIAGNOSIS — M79675 Pain in left toe(s): Secondary | ICD-10-CM

## 2022-11-07 DIAGNOSIS — B351 Tinea unguium: Secondary | ICD-10-CM

## 2022-11-07 DIAGNOSIS — M79674 Pain in right toe(s): Secondary | ICD-10-CM | POA: Diagnosis not present

## 2022-11-07 NOTE — Progress Notes (Signed)
Subjective:  Patient ID: [name], female    DOB: February 15, 1946,  MRN: [id number]  [name] presents to clinic today for at risk foot care with history of diabetic neuropathy and preulcerative lesion(s) b/l lower extremities and painful mycotic toenails that limit ambulation. Painful toenails interfere with ambulation. Aggravating factors include wearing enclosed shoe gear. Pain is relieved with periodic professional debridement. Painful porokeratotic lesions are aggravated when weightbearing with and without shoegear. Pain is relieved with periodic professional debridement.  Chief Complaint  Patient presents with   Nail Problem    [facility] PCP-McKeown,William PCP VST-07/2022   New problem(s): None.   PCP is Lucky Cowboy, MD.  Allergies  Allergen Reactions   Procaine Hcl Shortness Of Breath    Ok to use sq lidocaine for iv starts   Ciprofloxacin     Unknown reaction   Macrobid [Nitrofurantoin Macrocrystal]     Unknown reaction   Sulfonamide Derivatives Hives   Tetanus Toxoid Swelling    REACTION: arm swells   Tuberculin Ppd Swelling   Penicillins Rash    Review of Systems: Negative except as noted in the HPI.  Objective:  Vitals:   11/07/22 1352  BP: (!) 127/51    [name] is a pleasant 76 y.o. female morbidly obese in NAD. AAO x 3. Vascular CFT <3 seconds b/l LE. Palpable DP pulse(s) b/l LE. Palpable PT pulse(s) b/l LE. No pain with calf compression b/l. Lower extremity skin temperature gradient within normal limits. +1 pitting edema noted BLE. Evidence of chronic venous insufficiency b/l LE. No ischemia or gangrene noted b/l LE. No cyanosis or clubbing noted b/l LE.  Neurologic Normal speech. Oriented to person, place, and time. Pt has subjective symptoms of neuropathy. Protective sensation decreased with 10 gram monofilament b/l.  Dermatologic Skin warm and supple b/l. LE. No interdigital macerations noted b/l LE.   Left 3rd toe with hyperkeratotic lesion  distal tip with underlying fluctuance. Erythema noted at distal phalanx. Noted blood blister with partial thickness ulceration measuring 1.0 cm in diameter.  Toenails 1-5 b/l elongated, discolored, dystrophic, thickened, crumbly with subungual debris and tenderness to dorsal palpation.   Incurvated nailplate bilateral great toes.  Nail border hypertrophy absent. There is tenderness to palpation. Sign(s) of infection: no clinical signs of infection noted on examination today..  Hyperkeratotic lesion(s) right 2nd toe, b/l hallux, and submet head 5 left foot.  No erythema, no edema, no drainage, no fluctuance.   Preulcerative lesions of the distal tip of  left 2nd digiti and b/l 3rd digits. There is visible subdermal hemorrhage. There is no surrounding erythema, no edema, no drainage, no odor, no fluctuance.   Orthopedic: HAV with bunion deformity noted b/l LE. Severely contracted hammertoe deformity noted 1-5 bilaterally.   Assessment/Plan: 1. Pain due to onychomycosis of toenails of both feet   2. Corns and callosities   3. Pre-ulcerative corn or callous   4. Diabetic peripheral neuropathy associated with type 2 diabetes mellitus (HCC)     No orders of the defined types were placed in this encounter.   -Patient was evaluated and treated. All patient's and/or POA's questions/concerns answered on today's visit. -Continue digital padding daily to affected digits. -Continue supportive shoe gear daily. -Toenails 1-5 b/l were debrided in length and girth with sterile nail nippers and dremel without iatrogenic bleeding.  -No invasive procedure(s) performed. Offending nail border debrided and curretaged lateral border right hallux utilizing sterile nail nipper and currette. Border(s) cleansed with alcohol and triple antibiotic  ointment applied. Patient/POA/Caregiver/Facility instructed to apply Mupirocin Ointment  to right great toe once daily for 7 days. Call office if there are any  concerns. -Corn(s) R 2nd toe and callus(es) L hallux, R hallux, and submet head 5 left foot were pared utilizing sterile scalpel blade without incident. Total number debrided =4. -Preulcerative lesion pared distal tip of left 2nd toe, distal tip of left 3rd toe, and distal tip of right 3rd toe utilizing sterile scalpel blade. Total number pared=3. -Patient/POA to call should there be question/concern in the interim.   Return in about 9 weeks (around 01/09/2023).  Freddie Breech, DPM

## 2022-11-07 NOTE — Progress Notes (Unsigned)
CPE  Assessment and Plan:   Encounter for general adult medical examination with abnormal findings 1 year Schedule colonoscopy Requested diabetic eye exam report Schedule covid 19 with [place] [facility]  Controlled type 2 diabetes mellitus with complication, without long-term current use of insulin ([place]) Discussed general issues about diabetes pathophysiology and management., Educational material distributed., Suggested low cholesterol diet., Encouraged aerobic exercise., Discussed foot care., Reminded to get yearly retinal exam. -     Hemoglobin A1c ([facility])  Hyperlipidemia associated with T2DM (HCC) check lipids, statin/zetia for LDL goal <70 decrease fatty foods increase activity.  -    Lipid panel   CKD stage 3secondary to diabetes (HCC) Increase fluids, avoid NSAIDS, monitor sugars, will monitor -    CMP/GFR -    UA/microalbumin  Atherosclerosis of aorta (HCC) Control blood pressure, cholesterol, glucose, increase exercise.   CAD Cardiology following; defer EKG to them Control blood pressure, cholesterol, glucose, increase exercise.  Go to the ER if any chest pain, shortness of breath, nausea, dizziness.  Morbid obesity ([place]) - follow up 3 months for progress monitoring - increase veggies, decrease carbs - long discussion about weight loss, diet, and exercise  Labile hypertension - continue medications, DASH diet, exercise and monitor at home. Call if greater than 130/80.  -     CBC with Diff -     COMPLETE METABOLIC PANEL WITH GFR -     Urinalysis, Routine w reflex microscopic -     Microalbumin / Creatinine Urine Ratio  Hypothyroidism, unspecified type -     TSH Hypothyroidism-check TSH level, continue medications the same, reminded to take on an empty stomach 30-58mns before food.   Medication management -    Magnesium  Vitamin D deficiency -     Vitamin D (25 hydroxy)  History of tobacco use -23 pack year Monitor, had benign CT 12/2019  Chronic  venous insufficiency - weight loss discussed, continue compression stockings and elevation  History of basal cell carcinoma (BCC) Follow up derm  Gastroesophageal reflux disease with esophagitis without hemorrhage Continue PPI/H2 blocker, diet discussed  Diverticulosis of large intestine without hemorrhage Bowel management, soluble fiber, monitor   Peripheral autonomic neuropathy of unknown cause Monitor Continue Gabapentin  Primary osteoarthritis of knee, unspecified laterality Ortho following, monitoring at this time   Flu Vaccine need High Dose Flu vaccine given   Continue diet and meds as discussed. Further disposition pending results of labs. Over 30 minutes of exam, counseling, chart review, and critical decision making was performed  Future Appointments  Date Time Provider [place] 11/07/2022  2:00 PM GMarzetta Board DPM [facility]  11/08/2022  2:00 PM WAlycia Rossetti NP [facility] None  01/09/2023  1:45 PM GMarzetta Board DPM [facility]  02/09/2023  2:30 PM WAlycia Rossetti NP [facility] None  03/30/2023  2:45 PM GNicholas Lose MD [facility] None  11/09/2023  2:00 PM WAlycia Rossetti NP [facility] None    HPI 76y.o. female  presents for CPE. She has Diverticulosis of large intestine; History of tobacco use; Hypothyroidism; Controlled type 2 diabetes mellitus with complication, without long-term current use of insulin ([place]; Morbid obesity ([place]; Chronic venous insufficiency; Peripheral autonomic neuropathy of unknown cause; Labile hypertension; History of basal cell carcinoma (BCC); Osteoarthritis of knee; Atherosclerosis of aorta ([place] by CT scan in 2019; Vitamin D deficiency; Medication management; Gastroesophageal reflux disease with esophagitis; Type 2 diabetes mellitus with hyperlipidemia ([place]; CKD stage 3 secondary to diabetes ([place]; Family history of  heart disease; CAD (coronary artery disease); and Malignant neoplasm  of upper-outer quadrant of right breast in female, estrogen receptor positive ([place]) on their problem list.  She is widowed, 3 grandchildren, 1 great grandson - 17 months.  She is retired from [employer] -   She does have chronic knee pain and is using Tylenol and Voltaren gel with moderate relief.   GERD controlled on protonix and famotidine.   She is former smoker - 23.5 pack year hisotry, quit 2014, strong family hx of llung cancer, she had CT chest for coronary in 12/26/2019 which showed benign appearing nodules.  BMI is There is no height or weight on file to calculate BMI., she is working on diet and exercise, trying to watch what she eats and smaller portions. Down from 246 lb in 07/2018.  Wt Readings from Last 3 Encounters:  11/04/22 227 lb 12.8 oz (103.3 kg)  09/29/22 224 lb (101.6 kg)  09/13/22 224 lb (101.6 kg)   She is following with Dr. Andree Elk for CAD; in 01/2020 she had cardiac CT showing CCS of 693, 78th % for age/gender, Calcified plaque causing moderate stenosis in the prox LAD, distal RCA, proximal LCx, moderate stenosis, and also aortic atherosclerosis   Her blood pressure has been controlled at home, has been good, today their BP is    She does workout. She denies chest pain, shortness of breath, dizziness.  She has been working on diet and exercise for Diabetes, doing lifestyle changes  with diabetic chronic kidney disease she is on ACE/ARB With hyperlipidemia IS at goal less than 70, she is on lipitor 40 mg and zetia 10 mg  she is on bASA she has idiopathic paraesthsias and is on gabapentin- follows with Dr. Adah Perl every 90 days, did have infection/ulcer of left second toe in the past, no recent issues, checks feet daily  Eye exam Dr. Nicki Reaper - saw recently, report requested  denies polydipsia, polyuria and visual disturbances.   Last A1C was:  Lab Results  Component Value Date   HGBA1C 6.0 (H) 08/09/2022   Lab Results  Component Value Date   GFRNONAA  56 (L) 02/02/2022   Lab Results  Component Value Date   CHOL 131 08/09/2022   HDL 46 (L) 08/09/2022   LDLCALC 60 08/09/2022   TRIG 170 (H) 08/09/2022   CHOLHDL 2.8 08/09/2022   Patient is on Vitamin D supplement.   Lab Results  Component Value Date   VD25OH 69 05/03/2022     She is on thyroid medication. Her medication was not changed last visit. 75 mcg 1.5 tabs 4 days a week, 1 tab 3 days a week, takes on empty stomach with water only.   Lab Results  Component Value Date   TSH 0.56 08/09/2022  .     Current Medications:   Current Outpatient Medications (Endocrine & Metabolic):    dexamethasone (DECADRON) 1 MG tablet, Take 3 tabs for 3 days, 2 tabs for 3 days 1 tab for 5 days. Take with food.   levothyroxine (SYNTHROID) 100 MCG tablet, Take  1 whole  tablet  Daily  on an empty stomach with only water for 30 minutes & no Antacid meds, Calcium or Magnesium for 4 hours & avoid Biotin  Current Outpatient Medications (Cardiovascular):    atorvastatin (LIPITOR) 40 MG tablet, Take 1 tablet (40 mg total) by mouth daily for cholesterol   ezetimibe (ZETIA) 10 MG tablet, Take 1 tablet (10 mg total) by mouth daily for Cholesterol  furosemide (LASIX) 40 MG tablet, Take 1 tablet (40 mg total) by mouth 1-2 times daily for BP & Fluid Retention  Current Outpatient Medications (Respiratory):    pseudoephedrine (SUDAFED) 120 MG 12 hr tablet, Take   1 tablet    2 x /day (every 12 hours)    for Sinus & Chest Congestion  Current Outpatient Medications (Analgesics):    acetaminophen (TYLENOL) 650 MG CR tablet, Take 650 mg by mouth every 8 (eight) hours as needed for pain.   aspirin 81 MG tablet, Take 81 mg by mouth daily.   traMADol (ULTRAM) 50 MG tablet, Take by mouth every 6 (six) hours as needed. PRN   Current Outpatient Medications (Other):    Biotin 5000 MCG CHEW, Chew 5,000 mcg by mouth daily.   Calcium Carbonate-Vit D-Min (CALCIUM 1200 PO), Take by mouth.   cholecalciferol (VITAMIN D3)  25 MCG (1000 UNIT) tablet, Take 3,000 Units by mouth daily.   diclofenac Sodium (VOLTAREN) 1 % GEL, Apply 4 g topically 4 (four) times daily. (Patient taking differently: Apply 4 g topically 4 (four) times daily as needed (pain).)   dicyclomine (BENTYL) 20 MG tablet, TAKE 1 TABLET BY MOUTH THREE TIMES DAILY AS NEEDED FOR NAUSEA , BLOATING, CRAMPING OR DIARRHEA   dorzolamide-timolol (COSOPT) 22.3-6.8 MG/ML ophthalmic solution, Place 1 drop into both eyes 2 (two) times daily.   famotidine (PEPCID) 40 MG tablet, Take 1 tablet (40 mg total) by mouth daily.   fluconazole (DIFLUCAN) 150 MG tablet, Take 1 tablet 2 x /week  if needed for yeast infection   gabapentin (NEURONTIN) 800 MG tablet, Take 1 tablet (800 mg total) by mouth 3 (three) times daily with meals and 2 tablets at bedtime for chronic pain and hot flashes   gabapentin (NEURONTIN) 800 MG tablet, Take 1 tablet (800 mg total) by mouth 3 (three) times daily with meals and 2 tablets at bedtime for chronic pain and hot flashes   Krill Oil 500 MG CAPS, Take by mouth.   Multiple Vitamin (MULTIVITAMIN) tablet, Take 1 tablet by mouth daily.   mupirocin ointment (BACTROBAN) 2 %, Apply to left 3rd toe once daily   ondansetron (ZOFRAN) 4 MG tablet, Take  1 tablet  3 x /day  if needed for nausea & Vomiting   OVER THE COUNTER MEDICATION, Apply 1 application. topically daily as needed (pain). Stop Pain   OVER THE COUNTER MEDICATION, Elimi Fat   4 gummies   pantoprazole (PROTONIX) 40 MG tablet, Take 1 tablet (40 mg total) by mouth daily to prevent heartburn and indigestion   Probiotic Product (PROBIOTIC DAILY PO), Take 1 capsule by mouth daily.   tamoxifen (NOLVADEX) 20 MG tablet, Take 1 tablet (20 mg total) by mouth daily.  Medical History:  Past Medical History:  Diagnosis Date   Anemia    Arthritis    Chest tightness    Fatigue    GERD (gastroesophageal reflux disease)    Heart palpitations 12/06/2019   Hyperlipidemia    Hypothyroid    Internal  hemorrhoids 05/04/2009   Qualifier: Diagnosis of  By: Nelson-Smith CMA (AAMA), Dottie     Melanoma [facility]) 2013   Excision/surgery only. No other treatment   Myalgia    Neuromuscular disorder ([place])    Neuropathy   Obesity    Pre-diabetes    Prediabetes    Ulcer    Varicose veins    Health Maintenance:   Immunization History  Administered Date(s) Administered   Influenza, High Dose Seasonal PF 08/19/2014,  09/10/2015, 07/26/2016, 08/10/2017, 08/14/2018, 08/27/2019, 09/10/2020, 10/12/2021, 08/09/2022   Pneumococcal Conjugate-13 05/12/2014   Pneumococcal Polysaccharide-23 09/05/2007, 06/02/2015   Preventative care: Last colonoscopy: 2011, Due 2021, Dr. Kalman Shan, [place],refuses another  Last mammogram: 12/02/2020 Negative Last pap smear/pelvic exam: 02/2011- declines another DEXA: 07/2012, normal  Heart cath June 1999, no blockages, coronary spasms CTA 12/2019 IMPRESSION: 1. Coronary calcium score of 693. This was 78th percentile for age and sex matched control. 2. Normal coronary origin with right dominance. 3. Calcified plaque causing moderate stenosis in the prox LAD, distal RCA, proximal LCx. 4.CAD-RADS 3. Moderate stenosis. Consider symptom-guided anti-ischemic pharmacotherapy as well as risk factor modification per guideline directed care. Additional analysis with CT FFR will be submitted and reported separately.  Prior vaccinations: TD or Tdap: ALLERGY- just swollen arm  Influenza: 08/2020 Pneumococcal: 2016 Prevnar13: 2015  Shingrix 45 dollars a shot Covid 19: plans to get [facility]   Derm follows [facility], hx of melanoma to [facility], goes annually, last 2021 Dr. Nicki Reaper for eye care, checking for glaucoma, 08/2021   Patient Care Team: Unk Pinto, MD as PCP - General (Internal Medicine) Buford Dresser, MD as PCP - Cardiology (Cardiology) Macarthur Critchley, [place] as Referring Physician (Optometry) Delfina Redwood as Referring Physician  (Physician Assistant) [name], MD as Consulting Physician (General Surgery) Nicholas Lose, MD as Consulting Physician (Hematology and Oncology) Eppie Gibson, MD as Attending Physician (Radiation Oncology)  Allergies Allergies  Allergen Reactions   Procaine Hcl Shortness Of Breath    Ok to use sq lidocaine for iv starts   Ciprofloxacin     Unknown reaction   Macrobid [Nitrofurantoin Macrocrystal]     Unknown reaction   Sulfonamide Derivatives Hives   Tetanus Toxoid Swelling    REACTION: arm swells   Tuberculin Ppd Swelling   Penicillins Rash    SURGICAL HISTORY She  has a past surgical history that includes Abdominal hysterectomy; Tonsillectomy and adenoidectomy; Cataract extraction (Bilateral); Nasal sinus surgery; Rotator cuff repair (Left, more than 7 years ago); Cyst removal hand (Left, 07/30/2018); and Breast lumpectomy with radioactive seed localization (Right, 02/24/2022). FAMILY HISTORY Her family history includes Breast cancer in her cousin; Coronary artery disease (age of onset: 15) in her father; Dementia in her maternal aunt and mother; Lung cancer in her maternal grandmother and mother. SOCIAL HISTORY She  reports that she quit smoking about 9 years ago. Her smoking use included cigarettes. She started smoking about 57 years ago. She has a 25.00 pack-year smoking history. She has never used smokeless tobacco. She reports that she does not currently use alcohol. She reports that she does not use drugs.   Review of Systems:  Review of Systems  Constitutional:  Negative for chills, fever, malaise/fatigue and weight loss.  HENT:  Negative for congestion, hearing loss, sinus pain, sore throat and tinnitus.   Eyes:  Negative for blurred vision and double vision.  Respiratory:  Negative for cough, hemoptysis, sputum production, shortness of breath and wheezing.   Cardiovascular:  Negative for chest pain, palpitations, orthopnea, claudication, leg swelling and PND.   Gastrointestinal:  Negative for abdominal pain, blood in stool, constipation, diarrhea, heartburn, melena, nausea and vomiting.  Genitourinary: Negative.  Negative for dysuria and urgency.  Musculoskeletal:  Negative for back pain, falls, joint pain, myalgias and neck pain.  Skin:  Negative for rash.  Neurological:  Positive for tingling (bil feet). Negative for dizziness, tremors, sensory change, weakness and headaches.  Endo/Heme/Allergies:  Negative for polydipsia. Does not  bruise/bleed easily.  Psychiatric/Behavioral: Negative.  Negative for depression, memory loss, substance abuse and suicidal ideas. The patient is not nervous/anxious and does not have insomnia.   All other systems reviewed and are negative.   Physical Exam: There were no vitals taken for this visit. Wt Readings from Last 3 Encounters:  11/04/22 227 lb 12.8 oz (103.3 kg)  09/29/22 224 lb (101.6 kg)  09/13/22 224 lb (101.6 kg)   General Appearance: Well nourished, in no apparent distress. Eyes: PERRLA, EOMs, conjunctiva no swelling or erythema Sinuses: No Frontal/maxillary tenderness ENT/Mouth: Ext aud canals clear, TMs without erythema, bulging. No erythema, swelling, or exudate on post pharynx.  Tonsils not swollen or erythematous. Hearing normal.  Neck: Supple, thyroid normal.  Respiratory: Respiratory effort normal, BS equal bilaterally without rales, rhonchi, wheezing or stridor.  Cardio: RRR with no MRGs. Brisk peripheral pulses with mild edema bil lower legs  Abdomen: Soft, + BS,  Non tender, no guarding, rebound, hernias, masses. Lymphatics: Non tender without lymphadenopathy.  Musculoskeletal: Full ROM, 5/5 strength, normal steady gait Skin:  Warm, dry without rashes, lesions, ecchymosis.  Neuro: Cranial nerves intact. Normal muscle tone, no cerebellar symptoms.Bil feet with diminished sensation throughout to monofilament Psych: Awake and oriented X 3, normal affect, Insight and Judgment appropriate.   Breasts: declines today GU: no concerns, defer  EKG: gets by cardiology, defer  Alycia Rossetti, NP 12:41 PM [facility]

## 2022-11-08 ENCOUNTER — Encounter: Payer: Self-pay | Admitting: Nurse Practitioner

## 2022-11-08 ENCOUNTER — Ambulatory Visit (INDEPENDENT_AMBULATORY_CARE_PROVIDER_SITE_OTHER): Payer: PPO | Admitting: Nurse Practitioner

## 2022-11-08 ENCOUNTER — Other Ambulatory Visit: Payer: Self-pay | Admitting: *Deleted

## 2022-11-08 VITALS — BP 122/62 | HR 54 | Temp 97.5°F | Ht 66.0 in | Wt 230.0 lb

## 2022-11-08 DIAGNOSIS — E1122 Type 2 diabetes mellitus with diabetic chronic kidney disease: Secondary | ICD-10-CM

## 2022-11-08 DIAGNOSIS — E1169 Type 2 diabetes mellitus with other specified complication: Secondary | ICD-10-CM

## 2022-11-08 DIAGNOSIS — Z0001 Encounter for general adult medical examination with abnormal findings: Secondary | ICD-10-CM

## 2022-11-08 DIAGNOSIS — E039 Hypothyroidism, unspecified: Secondary | ICD-10-CM | POA: Diagnosis not present

## 2022-11-08 DIAGNOSIS — Z136 Encounter for screening for cardiovascular disorders: Secondary | ICD-10-CM

## 2022-11-08 DIAGNOSIS — E559 Vitamin D deficiency, unspecified: Secondary | ICD-10-CM

## 2022-11-08 DIAGNOSIS — K219 Gastro-esophageal reflux disease without esophagitis: Secondary | ICD-10-CM

## 2022-11-08 DIAGNOSIS — Z0000 Encounter for general adult medical examination without abnormal findings: Secondary | ICD-10-CM | POA: Diagnosis not present

## 2022-11-08 DIAGNOSIS — I2510 Atherosclerotic heart disease of native coronary artery without angina pectoris: Secondary | ICD-10-CM

## 2022-11-08 DIAGNOSIS — N1831 Chronic kidney disease, stage 3a: Secondary | ICD-10-CM | POA: Diagnosis not present

## 2022-11-08 DIAGNOSIS — Z85828 Personal history of other malignant neoplasm of skin: Secondary | ICD-10-CM

## 2022-11-08 DIAGNOSIS — N183 Chronic kidney disease, stage 3 unspecified: Secondary | ICD-10-CM | POA: Diagnosis not present

## 2022-11-08 DIAGNOSIS — Z87891 Personal history of nicotine dependence: Secondary | ICD-10-CM

## 2022-11-08 DIAGNOSIS — Z79899 Other long term (current) drug therapy: Secondary | ICD-10-CM

## 2022-11-08 DIAGNOSIS — M1710 Unilateral primary osteoarthritis, unspecified knee: Secondary | ICD-10-CM

## 2022-11-08 DIAGNOSIS — K5730 Diverticulosis of large intestine without perforation or abscess without bleeding: Secondary | ICD-10-CM

## 2022-11-08 DIAGNOSIS — E785 Hyperlipidemia, unspecified: Secondary | ICD-10-CM | POA: Diagnosis not present

## 2022-11-08 DIAGNOSIS — I872 Venous insufficiency (chronic) (peripheral): Secondary | ICD-10-CM

## 2022-11-08 DIAGNOSIS — I700 Atherosclerosis of aorta: Secondary | ICD-10-CM

## 2022-11-08 DIAGNOSIS — Z170 Estrogen receptor positive status [ER+]: Secondary | ICD-10-CM

## 2022-11-08 DIAGNOSIS — R1031 Right lower quadrant pain: Secondary | ICD-10-CM

## 2022-11-08 DIAGNOSIS — G9009 Other idiopathic peripheral autonomic neuropathy: Secondary | ICD-10-CM

## 2022-11-08 DIAGNOSIS — C50411 Malignant neoplasm of upper-outer quadrant of right female breast: Secondary | ICD-10-CM

## 2022-11-08 DIAGNOSIS — I10 Essential (primary) hypertension: Secondary | ICD-10-CM

## 2022-11-08 MED ORDER — TAMOXIFEN CITRATE 20 MG PO TABS: 20.0000 mg | ORAL_TABLET | Freq: Every day | ORAL | 3 refills | Status: DC

## 2022-11-08 NOTE — Patient Instructions (Signed)

## 2022-11-09 ENCOUNTER — Encounter: Payer: Self-pay | Admitting: Adult Health

## 2022-11-09 ENCOUNTER — Encounter: Payer: Self-pay | Admitting: Podiatry

## 2022-11-09 LAB — CBC WITH DIFFERENTIAL/PLATELET
Absolute Monocytes: 620 cells/uL (ref 200–950)
Basophils Absolute: 66 cells/uL (ref 0–200)
Basophils Relative: 0.7 %
Eosinophils Absolute: 188 cells/uL (ref 15–500)
Eosinophils Relative: 2 %
HCT: 39.6 % (ref 35.0–45.0)
Hemoglobin: 13.4 g/dL (ref 11.7–15.5)
Lymphs Abs: 2914 cells/uL (ref 850–3900)
MCH: 29.5 pg (ref 27.0–33.0)
MCHC: 33.8 g/dL (ref 32.0–36.0)
MCV: 87 fL (ref 80.0–100.0)
MPV: 11 fL (ref 7.5–12.5)
Monocytes Relative: 6.6 %
Neutro Abs: 5612 cells/uL (ref 1500–7800)
Neutrophils Relative %: 59.7 %
Platelets: 269 10*3/uL (ref 140–400)
RBC: 4.55 10*6/uL (ref 3.80–5.10)
RDW: 13.5 % (ref 11.0–15.0)
Total Lymphocyte: 31 %
WBC: 9.4 10*3/uL (ref 3.8–10.8)

## 2022-11-09 LAB — COMPLETE METABOLIC PANEL WITH GFR
AG Ratio: 1.6 (calc) (ref 1.0–2.5)
ALT: 17 U/L (ref 6–29)
AST: 19 U/L (ref 10–35)
Albumin: 4.1 g/dL (ref 3.6–5.1)
Alkaline phosphatase (APISO): 90 U/L (ref 37–153)
BUN: 12 mg/dL (ref 7–25)
CO2: 30 mmol/L (ref 20–32)
Calcium: 9.6 mg/dL (ref 8.6–10.4)
Chloride: 105 mmol/L (ref 98–110)
Creat: 0.95 mg/dL (ref 0.60–1.00)
Globulin: 2.5 g/dL (calc) (ref 1.9–3.7)
Glucose, Bld: 98 mg/dL (ref 65–99)
Potassium: 3.9 mmol/L (ref 3.5–5.3)
Sodium: 144 mmol/L (ref 135–146)
Total Bilirubin: 0.8 mg/dL (ref 0.2–1.2)
Total Protein: 6.6 g/dL (ref 6.1–8.1)
eGFR: 63 mL/min/{1.73_m2} (ref >=60)

## 2022-11-09 LAB — HEMOGLOBIN A1C
Hgb A1c MFr Bld: 6.7 % of total Hgb — ABNORMAL HIGH (ref <5.7)
Mean Plasma Glucose: 146 mg/dL
eAG (mmol/L): 8.1 mmol/L

## 2022-11-09 LAB — LIPID PANEL
Cholesterol: 127 mg/dL (ref <200)
HDL: 42 mg/dL — ABNORMAL LOW (ref >=50)
LDL Cholesterol (Calc): 57 mg/dL (calc)
Non-HDL Cholesterol (Calc): 85 mg/dL (calc) (ref <130)
Total CHOL/HDL Ratio: 3 (calc) (ref <5.0)
Triglycerides: 224 mg/dL — ABNORMAL HIGH (ref <150)

## 2022-11-09 LAB — URINALYSIS W MICROSCOPIC + REFLEX CULTURE
Bacteria, UA: NONE SEEN /HPF
Bilirubin Urine: NEGATIVE
Glucose, UA: NEGATIVE
Hgb urine dipstick: NEGATIVE
Hyaline Cast: NONE SEEN /LPF
Ketones, ur: NEGATIVE
Leukocyte Esterase: NEGATIVE
Nitrites, Initial: NEGATIVE
Protein, ur: NEGATIVE
RBC / HPF: NONE SEEN /HPF (ref 0–2)
Specific Gravity, Urine: 1.007 (ref 1.001–1.035)
WBC, UA: NONE SEEN /HPF (ref 0–5)
pH: 6 (ref 5.0–8.0)

## 2022-11-09 LAB — MICROALBUMIN / CREATININE URINE RATIO
Creatinine, Urine: 31 mg/dL (ref 20–275)
Microalb, Ur: <0.2 mg/dL

## 2022-11-09 LAB — VITAMIN D 25 HYDROXY (VIT D DEFICIENCY, FRACTURES): Vit D, 25-Hydroxy: 68 ng/mL (ref 30–100)

## 2022-11-09 LAB — NO CULTURE INDICATED

## 2022-11-09 LAB — TSH: TSH: 0.39 mIU/L — ABNORMAL LOW (ref 0.40–4.50)

## 2022-11-09 LAB — MAGNESIUM: Magnesium: 2.1 mg/dL (ref 1.5–2.5)

## 2022-11-09 NOTE — Assessment & Plan Note (Signed)
[name] is a 76 year old woman with stage IA ER/PR breast cancer s/p lumpectomy and antiestrogen therapy with Anastrozole.    She has developed difficulty with the anastrozole and we discussed the risk of recurrence in foregoing antiestrogen therapy all together.  I recommended trying a different antiestrogen therapy such as Tamoxifen or Exemestane.   We discussed the differences in each and sent in tamoxifen today.  If that causes issues, then she can try Exemestane.

## 2022-11-10 ENCOUNTER — Other Ambulatory Visit (HOSPITAL_COMMUNITY): Payer: Self-pay

## 2022-11-10 ENCOUNTER — Telehealth: Payer: Self-pay

## 2022-11-10 MED ORDER — ANASTROZOLE 1 MG PO TABS
1.0000 mg | ORAL_TABLET | Freq: Every day | ORAL | 0 refills | Status: DC
  Filled 2022-11-10: qty 30, 30d supply, fill #0

## 2022-11-10 NOTE — Telephone Encounter (Signed)
Returned Pts call regarding restarting anastrozole. Pt states she stopped taking anastrozole on 12/15 and tamoxifen was ordered by Wilber Bihari, NP at office visit. Pt states she never started the tamoxifen based on side effects discussed with Mendel Ryder. Discussed with Pt that per last note, Mendel Ryder also offered exemestane as an option. Pt stated she understands but would like to restart anastrozole. This RN ordered a 30 day supply of anastrozole and scheduled a one month follow up appt with Mendel Ryder. Pt verbalized understanding with no further questions at this time.

## 2022-11-18 ENCOUNTER — Telehealth: Payer: Self-pay | Admitting: Internal Medicine

## 2022-11-18 ENCOUNTER — Other Ambulatory Visit: Payer: Self-pay

## 2022-11-18 ENCOUNTER — Other Ambulatory Visit (HOSPITAL_COMMUNITY): Payer: Self-pay

## 2022-11-18 MED ORDER — ANASTROZOLE 1 MG PO TABS: 1.0000 mg | ORAL_TABLET | Freq: Every day | ORAL | 0 refills | Status: DC

## 2022-11-18 NOTE — Progress Notes (Signed)
  Chronic Care Management   Note  11/18/2022 Name: [name] MRN: [id number] DOB: [birth year]  [name] is a 76 y.o. year old female who is a primary care patient of Unk Pinto, MD. I reached out to [name] by phone today in response to a referral sent by [name]'s PCP, Unk Pinto, MD.   [name] was given information about Chronic Care Management services today including:  CCM service includes personalized support from designated clinical staff supervised by her physician, including individualized plan of care and coordination with other care providers 24/7 contact phone numbers for assistance for urgent and routine care needs. Service will only be billed when office clinical staff spend 20 minutes or more in a month to coordinate care. Only one practitioner may furnish and bill the service in a calendar month. The patient may stop CCM services at any time (effective at the end of the month) by phone call to the office staff.   Patient agreed to services and verbal consent obtained.   Follow up plan:   [name]

## 2022-11-29 ENCOUNTER — Telehealth: Payer: Self-pay

## 2022-11-29 DIAGNOSIS — C50911 Malignant neoplasm of unspecified site of right female breast: Secondary | ICD-10-CM | POA: Diagnosis not present

## 2022-12-06 ENCOUNTER — Ambulatory Visit: Payer: PPO | Admitting: Pharmacy Technician

## 2022-12-06 ENCOUNTER — Ambulatory Visit: Payer: PPO | Admitting: Nurse Practitioner

## 2022-12-06 DIAGNOSIS — E118 Type 2 diabetes mellitus with unspecified complications: Secondary | ICD-10-CM

## 2022-12-06 DIAGNOSIS — Z79899 Other long term (current) drug therapy: Secondary | ICD-10-CM

## 2022-12-06 DIAGNOSIS — E1169 Type 2 diabetes mellitus with other specified complication: Secondary | ICD-10-CM

## 2022-12-06 DIAGNOSIS — K5730 Diverticulosis of large intestine without perforation or abscess without bleeding: Secondary | ICD-10-CM

## 2022-12-06 DIAGNOSIS — G9009 Other idiopathic peripheral autonomic neuropathy: Secondary | ICD-10-CM

## 2022-12-06 NOTE — Progress Notes (Signed)
Assessment and Plan:  [name] was seen today for abdominal pain.  Diagnoses and all orders for this visit:  Labile hypertension - Currently controlled without medication. Continue DASH diet, exercise and monitor at home. Call if greater than 130/80.   Right lower quadrant abdominal pain/Left lower quadrant abdominal pain/ Right flank pain Alternate ice and heat Try Meloxicam once a day and flexeril q 8 hours as needed Will await U/S results for further treatment If pain increases, develop nausea/vomiting or fever go to the ER -     US Abdomen Complete; Future -     cyclobenzaprine (FLEXERIL) 5 MG tablet; Take 1 tablet (5 mg total) by mouth 3 (three) times daily as needed for muscle spasms. -     meloxicam (MOBIC) 7.5 MG tablet; Take 1 tablet (7.5 mg total) by mouth daily.          Further disposition pending results of labs. Discussed med's effects and SE's.   Over 30 minutes of exam, counseling, chart review, and critical decision making was performed.   Future Appointments  Date Time Provider [place]  01/09/2023  1:45 PM Marzetta Board, DPM [facility]  02/09/2023  2:30 PM Alycia Rossetti, NP [facility] None  03/13/2023  1:15 PM Marzetta Board, DPM [facility]  03/30/2023  2:45 PM Nicholas Lose, MD [facility] None  11/09/2023  2:00 PM Alycia Rossetti, NP [facility] None    ------------------------------------------------------------------------------------------------------------------   HPI BP 134/72   Pulse 63   Temp 98.1 F (36.7 C)   Ht '5\' 6"'$  (1.676 m)   Wt 228 lb 3.2 oz (103.5 kg)   SpO2 97%   BMI 36.83 kg/m   76 y.o.female presents for complaints of abdominal pain which has been persistent since the middle of December.  States the pain has decreased slightly in intensity but is still present. Pain is in right lower quadrant and will radiate to lower back. Denies hematuria. No change in bowel/bladder habits.  Pain is worse  with activity.    BP is currently well controlled without medication BP Readings from Last 3 Encounters:  12/07/22 134/72  11/08/22 122/62  11/07/22 (!) 127/51   BMI is Body mass index is 36.83 kg/m., she has been working on diet and exercise. Wt Readings from Last 3 Encounters:  12/07/22 228 lb 3.2 oz (103.5 kg)  11/08/22 230 lb (104.3 kg)  11/04/22 227 lb 12.8 oz (103.3 kg)     Past Medical History:  Diagnosis Date   Anemia    Arthritis    Chest tightness    Fatigue    GERD (gastroesophageal reflux disease)    Heart palpitations 12/06/2019   Hyperlipidemia    Hypothyroid    Internal hemorrhoids 05/04/2009   Qualifier: Diagnosis of  By: Nelson-Smith CMA (AAMA), Dottie     Melanoma [facility]) 2013   Excision/surgery only. No other treatment   Myalgia    Neuromuscular disorder (HCC)    Neuropathy   Obesity    Pre-diabetes    Prediabetes    Ulcer    Varicose veins      Allergies  Allergen Reactions   Procaine Hcl Shortness Of Breath    Ok to use sq lidocaine for iv starts   Ciprofloxacin     Unknown reaction   Macrobid [Nitrofurantoin Macrocrystal]     Unknown reaction   Sulfonamide Derivatives Hives   Tetanus Toxoid Swelling    REACTION: arm swells   Tuberculin Ppd Swelling   Penicillins Rash  Current Outpatient Medications on File Prior to Visit  Medication Sig   acetaminophen (TYLENOL) 650 MG CR tablet Take 650 mg by mouth every 8 (eight) hours as needed for pain.   anastrozole (ARIMIDEX) 1 MG tablet Take 1 tablet (1 mg total) by mouth daily.   aspirin 81 MG tablet Take 81 mg by mouth daily.   atorvastatin (LIPITOR) 40 MG tablet Take 1 tablet (40 mg total) by mouth daily for cholesterol   Biotin 5000 MCG CHEW Chew 5,000 mcg by mouth daily.   Calcium Carbonate-Vit D-Min (CALCIUM 1200 PO) Take by mouth.   cholecalciferol (VITAMIN D3) 25 MCG (1000 UNIT) tablet Take 3,000 Units by mouth daily.   diclofenac Sodium (VOLTAREN) 1 % GEL Apply 4 g topically 4  (four) times daily. (Patient taking differently: Apply 4 g topically 4 (four) times daily as needed (pain).)   dicyclomine (BENTYL) 20 MG tablet TAKE 1 TABLET BY MOUTH THREE TIMES DAILY AS NEEDED FOR NAUSEA , BLOATING, CRAMPING OR DIARRHEA   dorzolamide-timolol (COSOPT) 22.3-6.8 MG/ML ophthalmic solution Place 1 drop into both eyes 2 (two) times daily.   ezetimibe (ZETIA) 10 MG tablet Take 1 tablet (10 mg total) by mouth daily for Cholesterol   famotidine (PEPCID) 40 MG tablet Take 1 tablet (40 mg total) by mouth daily.   furosemide (LASIX) 40 MG tablet Take 1 tablet (40 mg total) by mouth 1-2 times daily for BP & Fluid Retention   gabapentin (NEURONTIN) 800 MG tablet Take 1 tablet (800 mg total) by mouth 3 (three) times daily with meals and 2 tablets at bedtime for chronic pain and hot flashes   Krill Oil 500 MG CAPS Take by mouth.   levothyroxine (SYNTHROID) 100 MCG tablet Take  1 whole  tablet  Daily  on an empty stomach with only water for 30 minutes & no Antacid meds, Calcium or Magnesium for 4 hours & avoid Biotin   Multiple Vitamin (MULTIVITAMIN) tablet Take 1 tablet by mouth daily.   mupirocin ointment (BACTROBAN) 2 % Apply to left 3rd toe once daily   ondansetron (ZOFRAN) 4 MG tablet Take  1 tablet  3 x /day  if needed for nausea & Vomiting   OVER THE COUNTER MEDICATION Elimi Fat   4 gummies   pantoprazole (PROTONIX) 40 MG tablet Take 1 tablet (40 mg total) by mouth daily to prevent heartburn and indigestion   Probiotic Product (PROBIOTIC DAILY PO) Take 1 capsule by mouth daily.   gabapentin (NEURONTIN) 800 MG tablet Take 1 tablet (800 mg total) by mouth 3 (three) times daily with meals and 2 tablets at bedtime for chronic pain and hot flashes   No current facility-administered medications on file prior to visit.    ROS: all negative except above.   Physical Exam:  BP 134/72   Pulse 63   Temp 98.1 F (36.7 C)   Ht '5\' 6"'$  (1.676 m)   Wt 228 lb 3.2 oz (103.5 kg)   SpO2 97%   BMI  36.83 kg/m   General Appearance: Well nourished, in no apparent distress. Eyes: PERRLA, EOMs, conjunctiva no swelling or erythema Sinuses: No Frontal/maxillary tenderness ENT/Mouth: Ext aud canals clear, TMs without erythema, bulging. No erythema, swelling, or exudate on post pharynx.  Tonsils not swollen or erythematous. Hearing normal.  Neck: Supple, thyroid normal.  Respiratory: Respiratory effort normal, BS equal bilaterally without rales, rhonchi, wheezing or stridor.  Cardio: RRR with no MRGs. Brisk peripheral pulses without edema.  Abdomen: Soft, + BS.  Tenderness right and left lower quadrant, no masses felt Lymphatics: Non tender without lymphadenopathy.  Musculoskeletal: Full ROM, 5/5 strength, normal gait. Tenderness of right flank with palpation, no masses Skin: Warm, dry without rashes, lesions, ecchymosis.  Neuro: Cranial nerves intact. Normal muscle tone, no cerebellar symptoms. Sensation intact.  Psych: Awake and oriented X 3, normal affect, Insight and Judgment appropriate.     Alycia Rossetti, NP 11:14 AM [name]

## 2022-12-07 ENCOUNTER — Ambulatory Visit (INDEPENDENT_AMBULATORY_CARE_PROVIDER_SITE_OTHER): Payer: PPO | Admitting: Nurse Practitioner

## 2022-12-07 ENCOUNTER — Encounter: Payer: Self-pay | Admitting: Nurse Practitioner

## 2022-12-07 VITALS — BP 134/72 | HR 63 | Temp 98.1°F | Ht 66.0 in | Wt 228.2 lb

## 2022-12-07 DIAGNOSIS — R109 Unspecified abdominal pain: Secondary | ICD-10-CM

## 2022-12-07 DIAGNOSIS — C50911 Malignant neoplasm of unspecified site of right female breast: Secondary | ICD-10-CM | POA: Diagnosis not present

## 2022-12-07 DIAGNOSIS — R0989 Other specified symptoms and signs involving the circulatory and respiratory systems: Secondary | ICD-10-CM

## 2022-12-07 DIAGNOSIS — R1032 Left lower quadrant pain: Secondary | ICD-10-CM

## 2022-12-07 DIAGNOSIS — R1031 Right lower quadrant pain: Secondary | ICD-10-CM | POA: Diagnosis not present

## 2022-12-07 MED ORDER — CYCLOBENZAPRINE HCL 5 MG PO TABS: 5.0000 mg | ORAL_TABLET | Freq: Three times a day (TID) | ORAL | 0 refills | Status: DC | PRN

## 2022-12-07 MED ORDER — MELOXICAM 7.5 MG PO TABS: 7.5000 mg | ORAL_TABLET | Freq: Every day | ORAL | 0 refills | Status: AC

## 2022-12-07 NOTE — Patient Instructions (Addendum)
YOU CAN CALL TO MAKE AN ULTRASOUND..  I have put in an order for an abdominal ultrasound for you to have You can set it up at your convenience by calling this number [phone] You will likely have the ultrasound at [place]  If you have any issues call our office and we will set this up for you.      Muscle Cramps and Spasms  For the next 5-7 days: Use Flexeril every 8 hours as needed for muscle relaxant Use Meloxicam once a day, do not take any other Ibuprofen or Aleve with this medication  Muscle cramps and spasms are when muscles tighten by themselves. They usually get better within minutes. Muscle cramps are painful. They are usually stronger and last longer than muscle spasms. Muscle spasms may or may not be painful. They can last a few seconds or much longer. Cramps and spasms can affect any muscle, but they occur most often in the calf muscles of the leg. They are usually not caused by a serious problem. In many cases, the cause is not known. Some common causes include: Doing more physical work or exercise than your body is ready for. Using the muscles too much (overuse) by repeating certain movements too many times. Staying in a certain position for a long time. Playing a sport or doing an activity without preparing properly. Using bad form or technique while playing a sport or doing an activity. Not having enough water in your body (dehydration). Injury. Side effects of some medicines. Low levels of the salts and minerals in your blood (electrolytes), such as low potassium or calcium. Follow these instructions at home: Managing pain and stiffness     Massage, stretch, and relax the muscle. Do this for many minutes at a time. If told, put heat on tight or tense muscles as often as told by your doctor. Use the heat source that your doctor recommends, such as a moist heat pack or a heating pad. Place a towel between your skin and the heat source. Leave the  heat on for 20-30 minutes. Remove the heat if your skin turns bright red. This is very important if you are not able to feel pain, heat, or cold. You may have a greater risk of getting burned. If told, put ice on the affected area. This may help if you are sore or have pain after a cramp or spasm. Put ice in a plastic bag. Place a towel between your skin and the bag. Leave the ice on for 20 minutes, 2-3 times a day. Try taking hot showers or baths to help relax tight muscles. Eating and drinking Drink enough fluid to keep your pee (urine) pale yellow. Eat a healthy diet to help ensure that your muscles work well. This should include: Fruits and vegetables. Lean protein. Whole grains. Low-fat or nonfat dairy products. General instructions If you are having cramps often, avoid intense exercise for several days. Take over-the-counter and prescription medicines only as told by your doctor. Watch for any changes in your symptoms. Keep all follow-up visits as told by your doctor. This is important. Contact a doctor if: Your cramps or spasms get worse or happen more often. Your cramps or spasms do not get better with time. Summary Muscle cramps and spasms are when muscles tighten by themselves. They usually get better within minutes. Cramps and spasms occur most often in the calf muscles of the leg. Massage, stretch, and relax the muscle. This may  help the cramp or spasm go away. Drink enough fluid to keep your pee (urine) pale yellow. This information is not intended to replace advice given to you by your health care provider. Make sure you discuss any questions you have with your health care provider. Document Revised: 05/28/2021 Document Reviewed: 05/28/2021 Elsevier Patient Education  [place].

## 2022-12-07 NOTE — Addendum Note (Signed)
Addended by: Lorenda Peck E on: 12/07/2022 12:06 PM   Modules accepted: Orders

## 2022-12-09 ENCOUNTER — Encounter: Payer: Self-pay | Admitting: Nurse Practitioner

## 2022-12-13 ENCOUNTER — Other Ambulatory Visit (HOSPITAL_COMMUNITY): Payer: Self-pay

## 2022-12-13 MED FILL — Atorvastatin Calcium Tab 40 MG (Base Equivalent): ORAL | 90 days supply | Qty: 90 | Fill #0 | Status: AC

## 2022-12-13 NOTE — Progress Notes (Signed)
Initial Pharmacist Visit (CCM)  [name],[name]  77 years, Female  DOB: 1946-08-07  M:  . Chronic Conditions Patient's Chronic Conditions: Diabetes (DM), Hyperlipidemia/Dyslipidemia (HLD), Cardiovascular Disease (CVD), Chronic Kidney Disease (CKD), Hypertension (HTN), Hypothyroidism, Gastroesophageal Reflux Disease (GERD), Osteoarthritis, Vitamins/Supplements . Summary for PCP:  1. Has acute PCP visit 12/07/22 for abdominal pain. 2. Patient to discuss neuropathy with podiatrist and PCP. She would like to try Lyrica. On a very high dose of Gabapentin. 3. Counseled on weight loss and proper diet and lifestyle modifications. . Doctor and [name] Visits Were there PCP Visits in last 6 months?: Yes PCP Visits details: 11/08/22-[name], [name]-Patient presented Yearly Annual Exam, BP 122/62 HR 54. Discontinue Meds: dexamethasone (DECADRON) 1 MG tablet, fluconazole (DIFLUCAN) 150 MG tablet,  pseudoephedrine (SUDAFED) 120 MG 12 hr tablet, tamoxifen (NOLVADEX) 20 MG tablet, traMADol (ULTRAM) 50 MG tablet  09/13/22-[name], [name]-Patient presented for insect bite, START: prednisone (DELTASONE) 20 MG tablet . 08/09/22-[name] [name]- Patient presented for Malignant Neoplasm of upper outer quadrant right female breast. Continue anastrozole and follow with oncology. .  Were there Specialist Visits in last 6 months?: Yes Specialist Visits details: 11/07/22-[name], DPM(Podiatry)-Patient presented for pain due to onychomycosis of toenails of both feet. No Medication changes noted .  11/04/22- [name], [name](Oncology)- Patient presented for follow up due to Malignant neoplasm of upper-outer quadrant of right breast in female, estrogen. receptor positive ([place]) Discontinue Meds: tamoxifen (NOLVADEX) 20 MG tablet, anastrozole (ARIMIDEX) 1 MG tablet, cyanocobalamin 1000 MCG tablet . 09/29/22- [name], [name](Oncology)- Patient presented for follow up due to Malignant  neoplasm of upper-outer quadrant of right breast in female, estrogen. receptor positive ([facility]) No Medication changes. . 08/31/22-[name], DPM(Podiatry)-Patient presented for pain due to onychomycosis of toenails of both feet. No Medication changes noted. Marland Kitchen 07/18/22-[name], DPM(Podiatry)- Patient presented for Wound Check. Ulcer to 3rd toe on left foot- Pre-diabetic. Patient stated she is not sure when the ulcer appeared. Patient saw Dr. Elisha Ponder on 06/27/2022. No Medication changes noted .  07/07/22-[name], [name], [name](Cardiology)-Patient presented for cardiac findings on her CT scan of her lungs 06/29/22. Patient continues to take 2 tablets of furosemide in the mornings. Her edema has noticeably improved. Discontinue: Tru Niagen (No Longer need it) . 06/27/22-[name], DPM(Podiatry)-Patient presented for at risk foot care with history of diabetic neuropathy and preulcerative lesion(s) bilateral 3rd toes and painful mycotic toenails that limit ambulation. Start: mupirocin ointment (BACTROBAN) 2 % . 06/13/22-Causey, [name], [name](Hematology and Oncology)-Patient presented for follow up due to Malignant neoplasm of upper-outer quadrant of right breast in female, estrogen receptor positive ([place]). Start: anastrozole (ARIMIDEX) 1 MG tablet, Discontinue: letrozole (FEMARA) 2.5 MG tablet, tramadol (ULTRAM) 50 MG tablet(No longer needed) . Was there a [name] Visit in last 30 days?: No Were there other [name] Visits in last 6 months?: No . Subjective Information Did patient bring medications to appointment?: No What source (s) was used to reconcile medications this visit?: EMR list, Verbal recall from patient/caregiver Subjective: Very nice patient presenting via phone for CCM initial visit. She currently lives at home and walks with a cane for stabilization. She is widowed, 3 grandchildren, 1 great grandson - 81 months. She is retired from [employer].  She is having  pain in her lower abdomen that will radiate to her lower back.  Went to Urgent care and was put on Keflex - states since completing the course the pain has improved. She did not notice any urinary frequency, dysuria or hematuria. No  change in bowel or bladder function.  She does have chronic knee pain and is using Tylenol and Voltaren gel with moderate relief. She continues t have pain and cold weather makes pain worse.  She had malignant neoplasm of upper outer quadrant of right breast.  She is followed by [name].  She is still trying to get regulated on antiestrogen medication- had a lot of side effects from the medications. Lifestyle habits: Diet: Regular diet, 2 meals/day, TV dinners- Lean Cuisine Exercise: No formal exercise program Tobacco: None Alcohol: None Caffeine: Maybe 1 c coffee daily, soda Recreational drugs: None What is the patient's sleep pattern?: Trouble falling asleep, Trouble staying asleep, Trouble going back to sleep after waking up How many hours per night does patient typically sleep?: 5 . SDOH questions were documented and reviewed (EMR or [employer]) within the past 12 months or since hospitalization?: No What is your living situation today? I have a steady place to live Think about the place you live. Do you have problems with any of the following? None of the above Within the past 12 months, you worried that your food would run out before you got money to buy more? Never true Within the past 12 months, the food you bought just didn't last and you didn't have money to get more? Never true In the past 12 months, has lack of reliable transportation kept you from medical appointments, meetings, work or from getting things needed for daily living? No In the past 12 months, has the electric, gas, oil, or water company threatened to shut off services in your home? No How often does anyone, including family and friends, physically hurt you? Never  How often does anyone,  including family and friends, insult or talk down to you? Never  How often does anyone, including friends and family, threaten you with harm? Never  How often does anyone, including family and friends, scream or curse at you? Never  . Medication Adherence Does the HC have access to medication refill history?: Yes Medication adherence rates for STAR metric medications: atorvastatin (LIPITOR) 40 MG tablet- LF:09/13/22 90DS Medication adherence rates for non-STAR metric medications: No 5 day gap in Non star metric meds Factors that may affect medication adherence?: Pill burden Name and location of [facility] [employer] plan: HTA Are meds synced by current pharmacy?: No Are meds delivered by current pharmacy?: No - delivery not available Would patient benefit from direct intervention of clinical lead in dispensing process to optimize clinical outcomes?: No Are [facility] pharmacy services available where patient lives?: Yes Is patient disadvantaged to use [facility]?: Yes Does patient experience delays in picking up medications due to transportation concerns (getting to pharmacy)?: No Medication organization: [facility] Additional Info: No copay barriers Assessment:: Adherent . Hypertension (HTN) Most Recent BP: 122/62 Most Recent HR: 54 taken on: 11/08/2022 Care Gap: Need BP documented or last BP 140/90 or higher: Addressed Assessed today?: No . Hyperlipidemia/Dyslipidemia (HLD) Last Lipid panel on: 11/08/2022 TC (Goal<200): 127 LDL: 57 HDL (Goal>40): 42 TG (Goal<150): 224 ASCVD 10-year risk?is:: N/A due to existing ASCVD Assessed today?: No Drug: atorvastatin (LIPITOR) 40 MG tablet Pharmacist Assessment: Appropriate, Effective, Safe, Accessible Drug: ezetimibe (ZETIA) 10 MG Pharmacist Assessment: Appropriate, Effective, Safe, Accessible . Diabetes (DM) Most recent A1C: 6.7 taken on: 11/08/2022 Previous A1C:  6.0 taken on: 08/09/2022 Most Recent GFR: 63 taken on: 11/08/2022 Type: 2 Most recent microalbumin ratio: <0.2 tested on: 11/08/2022 Care Gap:  Statin therapy needed: Addressed Care Gap: Need A1c documented or last A1c > 9 %: Addressed Care Gap: Need eye exam documented in EMR or by claim: Addressed Care Gap: Need eGFR and uACR for kidney health evaluation: Addressed Assessed today?: Yes Goal A1C: < 6.5 % Type: 2 Is Patient taking statin medication?: Yes Is patient taking ACEi / ARB?: No Reason patient is not taking ACEi / ARB: Unknown Blood glucose monitoring: Not monitoring Has patient experienced any hypoglycemic episodes?: No Additional Info: Lots of TV dinners. Skips meals alot and likes snacks. We discussed: Low carbohydrate eating plan with an emphasis on whole grains, legumes, nuts, fruits, and vegetables and minimal refined and processed foods., Healthy eating habits for DM with patient for 15-20 minutes, Proper diabetic foot care including daily foot exams and wearing closed toe shoes., Scheduling a yearly eye exam, Scheduling a dental appointment twice yearly Assessment:: Uncontrolled Plan/Follow up: 1. Counseled on proper diet and lifestyle modifications. 2. Counseled on weight loss. Marland Kitchen GERD Assessed today?: No Drug: famotidine (PEPCID) 40 MG Pharmacist Assessment: Appropriate, Effective, Safe, Accessible Drug: Protonix '40mg'$  daily Pharmacist Assessment: Appropriate, Effective, Query Safety . Chronic kidney disease (CKD) Most Recent GFR: 63 taken on: 11/08/2022 Previous GFR: 58 taken on: 08/09/2022 Most recent microalbumin ratio: <0.2 tested on: 11/08/2022 Assessed today?: No . Cardiovascular Disease (CVD) Care Gap: Moderate / high intensity statin therapy needed: Addressed Assessed today?: No Drug: Aspirin '81mg'$  daily Pharmacist Assessment: Appropriate, Effective, Safe, Accessible Drug: Lasix '40mg'$  1-2 tabs daily Pharmacist Assessment: Appropriate, Effective,  Safe, Accessible . Hypothyroidism Most recent TSH: 0.39 taken on: 11/08/2022 Previous TSH: 0.53 taken on: 08/09/2022 Assessed today?: No Drug: levothyroxine (SYNTHROID) 100 MCG tablet Pharmacist Assessment: Appropriate, Effective, Safe, Accessible . Osteoarthritis Assessed today?: No Drug: APAP '650mg'$  every 8 hours PRN Pharmacist Assessment: Appropriate, Effective, Safe, Accessible Drug: Voltaren Gel as needed Pharmacist Assessment: Appropriate, Effective, Safe, Accessible . Vitamins / Supplements We discussed: Lack of evidence for use of multivitamins, Confirming with pharmacist the safety of new vitamins / supplements with current medications before starting / adding to medication regimen, Separating divalent cation supplements from levothyroxine by at least 2 hours Drug: Biotin Pharmacist Assessment: Query Appropriateness Drug: Ca+d Pharmacist Assessment: Appropriate, Effective, Safe, Accessible Drug: MVI Pharmacist Assessment: Query Appropriateness Drug: Astrid Drafts Pharmacist Assessment: Query Appropriateness Drug: Probiotic . Preventative Health Care Gap: Colorectal cancer screening: Addressed Care Gap: Breast cancer screening: Addressed Care Gap: Annual Wellness Visit (AWV): Addressed Additional exercise counseling points. We discussed: aiming to lose 5 to 10% of body weight through lifestyle modifications, decreasing sedentary behavior Additional diet counseling points. We discussed: key components of a low-carb eating plan, key components of the DASH diet, aiming to consume at least 8 cups of water day . Clinical Summary Next Pharmacist Follow Up: 6 months Next AWV: 02/09/2023 Next PCP Visit: 12/07/2022 Recommended CRN Follow-Up in: Other Details: 3 months Recommended CRN Visit Type:: Phone call Physician Referral Statement:: I reaffirm my previous referral of patient for CCM services Attestation Statement:: CCM Services:  This encounter meets complex CCM services and  moderate to high medical decision making.  Prior to outreach and patient consent for Chronic Care Management, I referred this patient for services after reviewing the nominated patient list or from a personal encounter with the patient.  I have personally reviewed this encounter including the documentation in this note and have collaborated with the care management provider regarding care management and care coordination activities to include development and update of the comprehensive care plan I  am certifying that I agree with the content of this note and encounter as supervising physician. Marland Kitchen Pharmacist Interventions Pharmacist Interventions discussed: Yes Discontinued Therapy: No therapeutic indication Started Therapy: Preventative therapy Monitoring: Preventative health screenings, Routine monitoring Education: Lifestyle modifications . CPP Prep: 76mn CPP Televisit: 519m CPP Doc: [id number] Care Plan: 4mi19m Brandi StalkerarmD Clinical Pharmacist Brandi Sleightton'[email or web address] .care ([phone]) [phone] . CP Care Plan  Leidner,Tokiko  76 y30rs, Female  DOB: [birth year]   __________________________________________________ General Information Details Chronic Conditions: Diabetes (DM), Hyperlipidemia/Dyslipidemia (HLD), Hypertension (HTN), Hypothyroidism, Gastroesophageal Reflux Disease (GERD), Osteoarthritis Next Follow Up with the Pharmacist:: 6 months Contact your clinical pharmacist and care team with any questions or concerns. Team Phone #:: [phone] Pharmacist:: AverCesar Chavezistered Nurse: [name] Blood Pressure GOAL: Maintain Blood Pressure less than: 130/80 Your most recent BP is: 122/62 taken on: 11/08/2022 Patient education:: Recommend following the DASH diet, which emphasizes fruits and vegetables and low-fat dairy products along with whole grains, fish, poultry, and nuts. Reduce red meats  and sugars., Recommend limiting the daily amount of salt intake to less than '1500mg'$ /per day or 2/3 teaspoonful a day, Discussed ways to increase movement., Educated patient to call PCP office if you develop episodes of low blood pressure, dizziness, falls or increased headaches, and/or swelling of legs and feet. Discussed checking and recording home blood pressure readings: Every other day A weight loss of 10% can reduce blood pressure readings by 20/10 mmHg. Discussed setting a weight loss goal of: 10% High Cholesterol GOAL:: Maintain triglycerides less than 150, Maintain LDL (bad) cholesterol less than 55 Your most recent LDL level is: 42 Your most recent triglycerides level is: 224 taken on: 11/08/2022 Patient education:: Discussed how a diet high in fruits/vegetables/nuts/whole grains/beans may help to reduce your cholesterol. Increasing soluble fiber intake (whole grains, fruits, vegetables, beans, nuts and seeds). Recommended avoiding sugary foods and trans fat, limiting carbohydrates, and reducing portion sizes. Recommended Patient to incorporate more healthy fats (salmon, cold-water fish, almonds, walnuts) into their diet. Your current cholesterol medications are:: atorvastatin (LIPITOR) 40 MG tablet ezetimbe (Zetia) '10mg'$  daily Diabetes (DM) GOAL: Maintain an A1c (long-term blood sugar control) less than: 7% Your most recent A1C is: 6.7 taken on: 11/08/2022 Patient education:: Instructed patient to check feet daily, and to call with any signs of infection such as redness, fever, swelling, pain, rise of skin temperature, open wounds, drainage, or unusual odor. Instructed patient to wear cushioned closed toe shoes that fit well with thick, padded socks; avoid going barefoot.  Reviewed proper care of nails and calluses., Recommend avoiding both table sugar (ex: regular sodas, juice and sweet tea) and artificially (aspartame, saccharin, sucralose, etc.) sweetened beverages (ex: diet sodas) and  increasing water intake., Education provided on importance of regular mealtimes, carbohydrates goals for each meal and snacks, food label reading, portion sizes, and increasing intake of dietary fiber (whole grains, fruits, vegetables, beans, nuts and seeds)., Discussed a goal for after meal (within 2 hours) blood sugar readings to be less than 180, Recommend seeing the dentist twice yearly. Gastroesophageal Reflux Disease (GERD) GOAL:: Reduce and relieve severity and frequency of Gastroesophageal Reflux Disease (GERD) symptoms and prevent long-term complications Patient education:: Recommend elevating the head of the bed by 6-8 inches, avoid eating 2-3 hours before bedtime, and sleeping on the left side to reduce nighttime symptoms., Recommend avoiding tight fitting clothes around the waist., Recommend weight loss if overweight to help improve symptoms., Recommend  eating small frequent meals and identifying and avoiding trigger foods (alcohol, acidic foods, caffeine, fatty foods, garlic, onion, peppermint, spicy foods) Your current GERD medications are:: famotidine (PEPCID) 40 MG daily pantoprazole (Protonix) '40mg'$  daily Hypothyroidism GOAL:: Maintain TSH levels within target range of 0.450 and 4.500 Patient education:: Recommend taking levothyroxine first thing in the morning before eating any food or drinking anything other than water. Take with a full glass of water and wait at least 30 minutes before eating for the day. Separate from vitamins by at least 4 hours. Your current thyroid medications are:: levothyroxine (SYNTHROID) 100 MCG tablet Additional Information:: Separate from Calcium and Biotin supplements Arthritis GOAL: Decrease pain level to (on a 0-10 scale): 4 Patient education:: Discussed with patient the benefit of strengthening exercises to help improve pain control., Discussed with patient the benefit of weight loss with helping improve pain by reducing the amount of pressure on the  joints. Your current arthritis medications are:: Tylenol as needed Voltaren Gel/Aspercreme Other Needs Recommended Preventative Health Care:: Recommend a bone density screening for women over age 33

## 2022-12-15 ENCOUNTER — Ambulatory Visit: Payer: PPO | Admitting: Hematology and Oncology

## 2022-12-15 ENCOUNTER — Ambulatory Visit (HOSPITAL_BASED_OUTPATIENT_CLINIC_OR_DEPARTMENT_OTHER)
Admission: RE | Admit: 2022-12-15 | Discharge: 2022-12-15 | Disposition: A | Discharge status: Home / Self Care | Payer: PPO | Source: Ambulatory Visit | Attending: Nurse Practitioner | Admitting: Nurse Practitioner

## 2022-12-15 ENCOUNTER — Ambulatory Visit (HOSPITAL_BASED_OUTPATIENT_CLINIC_OR_DEPARTMENT_OTHER): Payer: PPO

## 2022-12-15 ENCOUNTER — Other Ambulatory Visit (HOSPITAL_COMMUNITY): Payer: Self-pay

## 2022-12-15 DIAGNOSIS — R1031 Right lower quadrant pain: Secondary | ICD-10-CM

## 2022-12-15 DIAGNOSIS — R109 Unspecified abdominal pain: Secondary | ICD-10-CM | POA: Insufficient documentation

## 2022-12-15 DIAGNOSIS — R1032 Left lower quadrant pain: Secondary | ICD-10-CM | POA: Diagnosis not present

## 2022-12-15 DIAGNOSIS — R1030 Lower abdominal pain, unspecified: Secondary | ICD-10-CM | POA: Diagnosis not present

## 2022-12-21 ENCOUNTER — Ambulatory Visit (INDEPENDENT_AMBULATORY_CARE_PROVIDER_SITE_OTHER): Payer: PPO | Admitting: Nurse Practitioner

## 2022-12-21 ENCOUNTER — Ambulatory Visit: Payer: PPO | Admitting: Internal Medicine

## 2022-12-21 ENCOUNTER — Other Ambulatory Visit: Payer: Self-pay

## 2022-12-21 ENCOUNTER — Encounter: Payer: Self-pay | Admitting: Nurse Practitioner

## 2022-12-21 VITALS — HR 69 | Temp 98.1°F

## 2022-12-21 DIAGNOSIS — R051 Acute cough: Secondary | ICD-10-CM | POA: Diagnosis not present

## 2022-12-21 DIAGNOSIS — E785 Hyperlipidemia, unspecified: Secondary | ICD-10-CM | POA: Diagnosis not present

## 2022-12-21 DIAGNOSIS — R6889 Other general symptoms and signs: Secondary | ICD-10-CM | POA: Diagnosis not present

## 2022-12-21 DIAGNOSIS — E1169 Type 2 diabetes mellitus with other specified complication: Secondary | ICD-10-CM | POA: Diagnosis not present

## 2022-12-21 DIAGNOSIS — Z1152 Encounter for screening for COVID-19: Secondary | ICD-10-CM

## 2022-12-21 DIAGNOSIS — E118 Type 2 diabetes mellitus with unspecified complications: Secondary | ICD-10-CM | POA: Diagnosis not present

## 2022-12-21 LAB — POCT INFLUENZA A/B
Influenza A, POC: POSITIVE — AB
Influenza B, POC: NEGATIVE

## 2022-12-21 LAB — POC COVID19 BINAXNOW: SARS Coronavirus 2 Ag: NEGATIVE

## 2022-12-21 MED ORDER — PROMETHAZINE-DM 6.25-15 MG/5ML PO SYRP: 2.5000 mL | ORAL_SOLUTION | Freq: Four times a day (QID) | ORAL | 0 refills | Status: DC | PRN

## 2022-12-21 NOTE — Progress Notes (Signed)
THIS ENCOUNTER IS A VIRTUAL VISIT DUE TO FLU  - PATIENT WAS NOT SEEN IN THE OFFICE.  PATIENT HAS CONSENTED TO VIRTUAL VISIT / TELEMEDICINE VISIT   Virtual Visit via telephone Note  I connected with  [name] on 12/21/2022 by telephone.  I verified that I am speaking with the correct person using two identifiers.    I discussed the limitations of evaluation and management by telemedicine and the availability of in person appointments. The patient expressed understanding and agreed to proceed.  History of Present Illness:  Pulse 69   Temp 98.1 F (36.7 C)   SpO2 95%   77 y.o. patient contacted office reporting sx of diarrhea, runny  nose, chills, fever 102.6 Max yesterday 12/20/22, cough, joint pain, muscle pain, fatigue, weakness. She denies SOB or productive cough. she tested positive by in office test for Flu A. OV was conducted by telephone to minimize exposure. This patient has been vaccinated for flu, last 08/09/2022.  Sx began Monday, two days ago with cough.  Treatments tried so far: none  Exposures: none   Medications  Current Outpatient Medications (Endocrine & Metabolic):    levothyroxine (SYNTHROID) 100 MCG tablet, Take  1 whole  tablet  Daily  on an empty stomach with only water for 30 minutes & no Antacid meds, Calcium or Magnesium for 4 hours & avoid Biotin  Current Outpatient Medications (Cardiovascular):    atorvastatin (LIPITOR) 40 MG tablet, Take 1 tablet (40 mg total) by mouth daily for cholesterol   ezetimibe (ZETIA) 10 MG tablet, Take 1 tablet (10 mg total) by mouth daily for Cholesterol   furosemide (LASIX) 40 MG tablet, Take 1 tablet (40 mg total) by mouth 1-2 times daily for BP & Fluid Retention   Current Outpatient Medications (Analgesics):    acetaminophen (TYLENOL) 650 MG CR tablet, Take 650 mg by mouth every 8 (eight) hours as needed for pain.   aspirin 81 MG tablet, Take 81 mg by mouth daily.   meloxicam (MOBIC) 7.5 MG tablet, Take 1 tablet (7.5  mg total) by mouth daily.   Current Outpatient Medications (Other):    anastrozole (ARIMIDEX) 1 MG tablet, Take 1 tablet (1 mg total) by mouth daily.   Biotin 5000 MCG CHEW, Chew 5,000 mcg by mouth daily.   Calcium Carbonate-Vit D-Min (CALCIUM 1200 PO), Take by mouth.   cholecalciferol (VITAMIN D3) 25 MCG (1000 UNIT) tablet, Take 3,000 Units by mouth daily.   cyclobenzaprine (FLEXERIL) 5 MG tablet, Take 1 tablet (5 mg total) by mouth 3 (three) times daily as needed for muscle spasms.   diclofenac Sodium (VOLTAREN) 1 % GEL, Apply 4 g topically 4 (four) times daily. (Patient taking differently: Apply 4 g topically 4 (four) times daily as needed (pain).)   dicyclomine (BENTYL) 20 MG tablet, TAKE 1 TABLET BY MOUTH THREE TIMES DAILY AS NEEDED FOR NAUSEA , BLOATING, CRAMPING OR DIARRHEA   dorzolamide-timolol (COSOPT) 22.3-6.8 MG/ML ophthalmic solution, Place 1 drop into both eyes 2 (two) times daily.   famotidine (PEPCID) 40 MG tablet, Take 1 tablet (40 mg total) by mouth daily.   gabapentin (NEURONTIN) 800 MG tablet, Take 1 tablet (800 mg total) by mouth 3 (three) times daily with meals and 2 tablets at bedtime for chronic pain and hot flashes   Krill Oil 500 MG CAPS, Take by mouth.   Multiple Vitamin (MULTIVITAMIN) tablet, Take 1 tablet by mouth daily.   mupirocin ointment (BACTROBAN) 2 %, Apply to left 3rd toe once daily  ondansetron (ZOFRAN) 4 MG tablet, Take  1 tablet  3 x /day  if needed for nausea & Vomiting   OVER THE COUNTER MEDICATION, Elimi Fat   4 gummies   pantoprazole (PROTONIX) 40 MG tablet, Take 1 tablet (40 mg total) by mouth daily to prevent heartburn and indigestion   Probiotic Product (PROBIOTIC DAILY PO), Take 1 capsule by mouth daily.  Allergies:  Allergies  Allergen Reactions   Procaine Hcl Shortness Of Breath    Ok to use sq lidocaine for iv starts   Ciprofloxacin     Unknown reaction   Macrobid [Nitrofurantoin Macrocrystal]     Unknown reaction   Sulfonamide  Derivatives Hives   Tetanus Toxoid Swelling    REACTION: arm swells   Tuberculin Ppd Swelling   Penicillins Rash    Problem list She has Diverticulosis of large intestine; History of tobacco use; Hypothyroidism; Controlled type 2 diabetes mellitus with complication, without long-term current use of insulin ([place]); Morbid obesity ([place]); Chronic venous insufficiency; Peripheral autonomic neuropathy of unknown cause; Labile hypertension; History of basal cell carcinoma ([facility]); Osteoarthritis of knee; Atherosclerosis of aorta ([place]) by CT scan in 2019; Vitamin D deficiency; Medication management; Gastroesophageal reflux disease with esophagitis; Type 2 diabetes mellitus with hyperlipidemia ([place]); CKD stage 3 secondary to diabetes ([place]); Family history of heart disease; CAD (coronary artery disease); and Malignant neoplasm of upper-outer quadrant of right breast in female, estrogen receptor positive ([place]) on their problem list.   Social History:   reports that she quit smoking about 9 years ago. Her smoking use included cigarettes. She started smoking about 57 years ago. She has a 25.00 pack-year smoking history. She has never used smokeless tobacco. She reports that she does not currently use alcohol. She reports that she does not use drugs.  Observations/Objective:  General : Well sounding patient in no apparent distress HEENT: no hoarseness, no cough for duration of visit Lungs: speaks in complete sentences, no audible wheezing, no apparent distress Neurological: alert, oriented x 3 Psychiatric: pleasant, judgement appropriate   Assessment and Plan:  Flu A Flu A positive per rapid screening test in office Risk factors include: Age, DM2,  Symptoms are: moderate Due to co morbid conditions and risk factors, discussed antivirals tamiflu not indicated d/t onset of symptoms. Immue support reviewed Vitamin C, Vitamin D, zinc Take tylenol PRN temp 101+ Push hydration Sx supportive therapy  suggested Follow up via mychart or telephone if needed Advised patient obtain O2 monitor; present to ED if persistently <90% or with severe dyspnea, CP, fever uncontrolled by tylenol, confusion, sudden decline        Concerned for dehydration and suggested Gatorade and to reach out to daughter to keep close eye on patient.  Patient reported that she would reach out to daughter and advise. Altough patient denied being SOB and having a productive cough, oxygen saturation is at 95%.  Instructed patient to have someone keep a close eye on her since she lives alone.  I have also contacted daughter (left vm) who is listed on HIPAA form to relay how  mother is currently feeling and advise that patient may need IV hydration d/t high fever and diarrhea.   1. Flu-like symptoms Positive Flu A  2. Encounter for screening for COVID-19 Negative  3. Acute cough  - promethazine-dextromethorphan (PROMETHAZINE-DM) 6.25-15 MG/5ML syrup; Take 2.5 mLs by mouth 4 (four) times daily as needed for cough.  Dispense: 240 mL; Refill: 0   Follow Up Instructions:  I discussed  the assessment and treatment plan with the patient. The patient was provided an opportunity to ask questions and all were answered. The patient agreed with the plan and demonstrated an understanding of the instructions.   The patient was advised to call back or seek an in-person evaluation if the symptoms worsen or if the condition fails to improve as anticipated.  I provided 15 minutes of non-face-to-face time during this encounter.   Darrol Jump, NP

## 2022-12-21 NOTE — Progress Notes (Signed)
     C  A  N  C  E  L  L  E  D  (+) Covid   Rescheduled  to Tele Visit

## 2022-12-27 ENCOUNTER — Other Ambulatory Visit: Payer: PPO

## 2023-01-06 ENCOUNTER — Telehealth: Payer: Self-pay | Admitting: *Deleted

## 2023-01-06 NOTE — Telephone Encounter (Signed)
Patient is calling because she has an upcoming appointment w/ Dr Elisha Ponder on 02/19,per insurance an prior authorization has to be completed before visit for services -[id number]. Please advise.

## 2023-01-09 ENCOUNTER — Ambulatory Visit: Payer: PPO | Admitting: Podiatry

## 2023-01-12 ENCOUNTER — Other Ambulatory Visit: Payer: Self-pay

## 2023-01-12 ENCOUNTER — Other Ambulatory Visit (HOSPITAL_COMMUNITY): Payer: Self-pay

## 2023-01-16 DIAGNOSIS — Z853 Personal history of malignant neoplasm of breast: Secondary | ICD-10-CM | POA: Diagnosis not present

## 2023-01-16 LAB — HM MAMMOGRAPHY

## 2023-01-17 ENCOUNTER — Encounter: Payer: Self-pay | Admitting: Internal Medicine

## 2023-01-24 DIAGNOSIS — H40013 Open angle with borderline findings, low risk, bilateral: Secondary | ICD-10-CM | POA: Diagnosis not present

## 2023-01-25 ENCOUNTER — Other Ambulatory Visit (HOSPITAL_COMMUNITY): Payer: Self-pay

## 2023-01-25 ENCOUNTER — Other Ambulatory Visit: Payer: Self-pay

## 2023-01-30 ENCOUNTER — Ambulatory Visit: Payer: PPO | Admitting: Nurse Practitioner

## 2023-02-06 ENCOUNTER — Ambulatory Visit: Payer: PPO | Admitting: Podiatry

## 2023-02-06 VITALS — BP 104/55

## 2023-02-06 DIAGNOSIS — M79675 Pain in left toe(s): Secondary | ICD-10-CM

## 2023-02-06 DIAGNOSIS — L84 Corns and callosities: Secondary | ICD-10-CM

## 2023-02-06 DIAGNOSIS — B351 Tinea unguium: Secondary | ICD-10-CM

## 2023-02-06 DIAGNOSIS — M79674 Pain in right toe(s): Secondary | ICD-10-CM | POA: Diagnosis not present

## 2023-02-06 DIAGNOSIS — G9009 Other idiopathic peripheral autonomic neuropathy: Secondary | ICD-10-CM | POA: Diagnosis not present

## 2023-02-07 NOTE — Progress Notes (Signed)
Subjective:  Patient ID: [name], female    DOB: [birth year],  MRN: [id number]  [name] presents to clinic today for at risk foot care with history of peripheral neuropathy and preulcerative lesion(s) both feet and painful mycotic toenails that limit ambulation. Painful toenails interfere with ambulation. Aggravating factors include wearing enclosed shoe gear. Pain is relieved with periodic professional debridement. Painful porokeratotic lesions are aggravated when weightbearing with and without shoegear. Pain is relieved with periodic professional debridement.  Chief Complaint  Patient presents with   Nail Problem    [facility] PCP-[facility] P[facility] VST-3 months ago    New problem(s): None.   PCP is Unk Pinto, MD.  Allergies  Allergen Reactions   Procaine Hcl Shortness Of Breath    Ok to use sq lidocaine for iv starts   Ciprofloxacin     Unknown reaction   Macrobid [Nitrofurantoin Macrocrystal]     Unknown reaction   Sulfonamide Derivatives Hives   Tetanus Toxoid Swelling    REACTION: arm swells   Tuberculin Ppd Swelling   Penicillins Rash   Review of Systems: Negative except as noted in the HPI.  Objective: No changes noted in today's physical examination. Vitals:   02/06/23 1642  BP: (!) 104/55   SHAQUAN FONTANEZ is a pleasant 77 y.o. female obese in NAD. AAO x 3.  Vascular CFT <3 seconds b/l LE. Palpable DP pulse(s) b/l LE. Palpable PT pulse(s) b/l LE. No pain with calf compression b/l. Lower extremity skin temperature gradient within normal limits. +1 pitting edema noted BLE. Evidence of chronic venous insufficiency b/l LE. No ischemia or gangrene noted b/l LE. No cyanosis or clubbing noted b/l LE.  Neurologic Normal speech. Oriented to person, place, and time. Pt has subjective symptoms of neuropathy. Protective sensation decreased with 10 gram monofilament b/l.  Dermatologic Skin warm and supple b/l. LE. No interdigital macerations noted b/l LE.   Left 3rd toe  with hyperkeratotic lesion distal tip with underlying fluctuance. Erythema noted at distal phalanx. Noted blood blister with partial thickness ulceration measuring 1.0 cm in diameter.  Toenails 1-5 b/l elongated, discolored, dystrophic, thickened, crumbly with subungual debris and tenderness to dorsal palpation.   Incurvated nailplate bilateral great toes.  Nail border hypertrophy absent. There is tenderness to palpation. Sign(s) of infection: no clinical signs of infection noted on examination today..  Hyperkeratotic lesion(s) right 2nd toe, b/l hallux, and submet head 5 left foot.  No erythema, no edema, no drainage, no fluctuance.   Preulcerative lesions of the distal tip of left 2nd digit and b/l 3rd digits. There is visible subdermal hemorrhage. There is no surrounding erythema, no edema, no drainage, no odor, no fluctuance.   Orthopedic: HAV with bunion deformity noted b/l LE. Severely contracted hammertoe deformity noted 1-5 bilaterally.   Assessment/Plan: 1. Pain due to onychomycosis of toenails of both feet   2. Corns and callosities   3. Pre-ulcerative corn or callous   4. Peripheral autonomic neuropathy of unknown cause     -Consent given for treatment as described below: -Examined patient. -Continue supportive shoe gear daily. -Toenails 1-5 b/l were debrided in length and girth with sterile nail nippers and dremel without iatrogenic bleeding.  -Corn(s) R 2nd toe and callus(es) bilateral great toes and submet head 5 left foot were pared utilizing sterile scalpel blade without incident. Total number debrided =4. -Preulcerative lesion pared left second digit, L 3rd toe, and R 3rd toe utilizing sterile scalpel blade. Total number pared=3. -Patient/POA to call should  there be question/concern in the interim.   Return in about 8 weeks (around 04/03/2023).  Marzetta Board, DPM

## 2023-02-07 NOTE — Progress Notes (Incomplete)
  Subjective:  Patient ID: [name], female    DOB: Sep 04, 1946,  MRN: [id number]  [name] presents to clinic today for {jgcomplaint:23593}  Chief Complaint  Patient presents with  . Nail Problem    [facility] PCP-[facility] PCP VST-3 months ago    New problem(s): None. {jgcomplaint:23593}  PCP is Unk Pinto, MD.  Allergies  Allergen Reactions  . Procaine Hcl Shortness Of Breath    Ok to use sq lidocaine for iv starts  . Ciprofloxacin     Unknown reaction  . Macrobid [Nitrofurantoin Macrocrystal]     Unknown reaction  . Sulfonamide Derivatives Hives  . Tetanus Toxoid Swelling    REACTION: arm swells  . Tuberculin Ppd Swelling  . Penicillins Rash    Review of Systems: Negative except as noted in the HPI.  Objective: No changes noted in today's physical examination. Vitals:   02/06/23 1642  BP: (!) 104/55   IVONNA DELMONICO is a pleasant 77 y.o. female obese in NAD. AAO x 3.  Vascular CFT <3 seconds b/l LE. Palpable DP pulse(s) b/l LE. Palpable PT pulse(s) b/l LE. No pain with calf compression b/l. Lower extremity skin temperature gradient within normal limits. +1 pitting edema noted BLE. Evidence of chronic venous insufficiency b/l LE. No ischemia or gangrene noted b/l LE. No cyanosis or clubbing noted b/l LE.  Neurologic Normal speech. Oriented to person, place, and time. Pt has subjective symptoms of neuropathy. Protective sensation decreased with 10 gram monofilament b/l.  Dermatologic Skin warm and supple b/l. LE. No interdigital macerations noted b/l LE.   Left 3rd toe with hyperkeratotic lesion distal tip with underlying fluctuance. Erythema noted at distal phalanx. Noted blood blister with partial thickness ulceration measuring 1.0 cm in diameter.  Toenails 1-5 b/l elongated, discolored, dystrophic, thickened, crumbly with subungual debris and tenderness to dorsal palpation.   Incurvated nailplate bilateral great toes.  Nail border hypertrophy absent. There  is tenderness to palpation. Sign(s) of infection: no clinical signs of infection noted on examination today..  Hyperkeratotic lesion(s) right 2nd toe, b/l hallux, and submet head 5 left foot.  No erythema, no edema, no drainage, no fluctuance.   Preulcerative lesions of the distal tip of left 2nd digit and b/l 3rd digits. There is visible subdermal hemorrhage. There is no surrounding erythema, no edema, no drainage, no odor, no fluctuance.   Orthopedic: HAV with bunion deformity noted b/l LE. Severely contracted hammertoe deformity noted 1-5 bilaterally.   Assessment/Plan: No diagnosis found.  No orders of the defined types were placed in this encounter.   None {Jgplan:23602::"-Patient/POA to call should there be question/concern in the interim."}   No follow-ups on file.  Marzetta Board, DPM

## 2023-02-08 ENCOUNTER — Encounter: Payer: Self-pay | Admitting: Podiatry

## 2023-02-08 NOTE — Progress Notes (Deleted)
MEDICARE ANNUAL WELLNESS    Assessment and Plan:   Encounter for Medicare annual wellness Yearly  Breast Cancer Recent diagnosis, has appt 3/15 with oncology  Controlled type 2 diabetes mellitus with complication, without long-term current use of insulin ([place]) Discussed general issues about diabetes pathophysiology and management., Educational material distributed., Suggested low cholesterol diet., Encouraged aerobic exercise., Discussed foot care., Reminded to get yearly retinal exam. -     Hemoglobin A1c ([facility])  Hyperlipidemia associated with T2DM (HCC) check lipids, statin/zetia for LDL goal <70 decrease fatty foods increase activity.  -    Lipid panel   CKD stage 3 secondary to diabetes (HCC) Increase fluids, avoid NSAIDS, monitor sugars, will monitor -    CMP/GFR -    UA/microalbumin  Atherosclerosis of aorta (HCC) Per CT 03/23/17 Control blood pressure, cholesterol, glucose, increase exercise.   CAD Cardiology following; defer EKG to them Control blood pressure, cholesterol, glucose, increase exercise.  Go to the ER if any chest pain, shortness of breath, nausea, dizziness.  Morbid obesity ([place]) - follow up 3 months for progress monitoring - increase veggies, decrease carbs - long discussion about weight loss, diet, and exercise  Labile hypertension - continue medications, DASH diet, exercise and monitor at home. Call if greater than 130/80.  -     CBC with Diff -     COMPLETE METABOLIC PANEL WITH GFR -     Urinalysis, Routine w reflex microscopic -     Microalbumin / Creatinine Urine Ratio  Hypothyroidism, unspecified type -     TSH Hypothyroidism-check TSH level, continue medications the same, reminded to take on an empty stomach 30-38mins before food.   Medication management Continued  Vitamin D deficiency -     Vitamin D (25 hydroxy)  History of tobacco use -23 pack year Monitor, had benign CT 12/2019  Chronic venous insufficiency - weight loss  discussed, continue compression stockings and elevation  History of basal cell carcinoma (BCC) Follow up derm  Gastroesophageal reflux disease with esophagitis without hemorrhage Continue PPI/H2 blocker, diet discussed  Diverticulosis of large intestine without hemorrhage Bowel management, soluble fiber, monitor   Peripheral autonomic neuropathy of unknown cause Monitor- follows with Dr. Adah Perl  Primary osteoarthritis of knee, unspecified laterality Ortho following, monitoring at this time     Continue diet and meds as discussed. Further disposition pending results of labs. Over 30 minutes of face to face interview, exam, counseling, chart review, and critical decision making was performed  Future Appointments  Date Time Provider [place]  02/09/2023  2:30 PM Alycia Rossetti, NP [facility] None  03/30/2023  2:45 PM Nicholas Lose, MD [facility] None  04/04/2023  3:15 PM Marzetta Board, DPM [facility]  06/06/2023  3:15 PM Marzetta Board, DPM [facility]  08/08/2023  2:15 PM Marzetta Board, DPM [facility]  11/09/2023  2:00 PM Alycia Rossetti, NP [facility] None    HPI 77 y.o. female  presents for Medicare annual wellness and 3 month follow up. She has Diverticulosis of large intestine; History of tobacco use; Hypothyroidism; Controlled type 2 diabetes mellitus with complication, without long-term current use of insulin ([place]); Morbid obesity ([place]); Chronic venous insufficiency; Peripheral autonomic neuropathy of unknown cause; Labile hypertension; History of basal cell carcinoma (BCC); Osteoarthritis of knee; Atherosclerosis of aorta ([place]) by CT scan in 2019; Vitamin D deficiency; Medication management; Gastroesophageal reflux disease with esophagitis; Type 2 diabetes mellitus with hyperlipidemia ([place]); CKD stage 3 secondary to diabetes ([place]); Family history of  heart disease; CAD (coronary artery disease); and Malignant neoplasm of  upper-outer quadrant of right breast in female, estrogen receptor positive ([place]) on their problem list.  She is widowed, 3 grandchildren, 1 great grandson - 17 months.  She is retired from [employer] -   She found out 2 days ago that she has breast cancer, has upcoming appointment with Dr. Lindi Adie. Had biopsy which revealed breast Cancer. Most likely will be lumpectomy only.   She is former smoker - 23.5 pack year hisotry, quit 2015, strong family hx of llung cancer, she had CT chest for coronary in 12/26/2019 which showed benign appearing nodules.   BMI is There is no height or weight on file to calculate BMI., she is working on diet and exercise, trying to watch what she eats and smaller portions. Down from 246 lb in 07/2018.  Wt Readings from Last 3 Encounters:  12/07/22 228 lb 3.2 oz (103.5 kg)  11/08/22 230 lb (104.3 kg)  11/04/22 227 lb 12.8 oz (103.3 kg)   She is following with Dr. Andree Elk for CAD; in 01/2020 she had cardiac CT 12/27/19  showing CCS of 693, 78th % for age/gender, Calcified plaque causing moderate stenosis in the prox LAD, distal RCA, proximal LCx, moderate stenosis, and also aortic atherosclerosis   Her blood pressure has been controlled at home, has been good, today their BP is    BP Readings from Last 3 Encounters:  02/06/23 (!) 104/55  12/07/22 134/72  11/08/22 122/62     She does workout. She denies chest pain, shortness of breath, dizziness.  She has been working on diet and exercise for Diabetes, doing lifestyle changes  with diabetic chronic kidney disease she is on ACE/ARB With hyperlipidemia IS at goal less than 70, she taking atorvastatin 40 mg and ezetimibe 10 mg  she is on bASA Has idiopathic paraesthsias and is on gabapentin- follows with Dr. Adah Perl every 90 days, did have infection/ulcer of left second toe in the past, no recent issues, checks feet daily  Eye exam Dr. Nicki Reaper - saw recently, report requested  denies polydipsia, polyuria and  visual disturbances.   Last A1C was:  Lab Results  Component Value Date   HGBA1C 6.7 (H) 11/08/2022   Lab Results  Component Value Date   GFRNONAA 56 (L) 02/02/2022   Lab Results  Component Value Date   CHOL 127 11/08/2022   HDL 42 (L) 11/08/2022   LDLCALC 57 11/08/2022   TRIG 224 (H) 11/08/2022   CHOLHDL 3.0 11/08/2022   Patient is on Vitamin D supplement.   Lab Results  Component Value Date   VD25OH 68 11/08/2022     She is on thyroid medication. Her medication was not changed last visit. 75 mcg 1.5 tabs 4 days a week, 1 tab 3 days a week, takes on empty stomach with water only.   Lab Results  Component Value Date   TSH 0.39 (L) 11/08/2022  .     Current Medications:   Current Outpatient Medications (Endocrine & Metabolic):    levothyroxine (SYNTHROID) 100 MCG tablet, Take  1 whole  tablet  Daily  on an empty stomach with only water for 30 minutes & no Antacid meds, Calcium or Magnesium for 4 hours & avoid Biotin  Current Outpatient Medications (Cardiovascular):    atorvastatin (LIPITOR) 40 MG tablet, Take 1 tablet (40 mg total) by mouth daily for cholesterol   ezetimibe (ZETIA) 10 MG tablet, Take 1 tablet (10 mg total) by mouth daily  for Cholesterol   furosemide (LASIX) 40 MG tablet, Take 1 tablet (40 mg total) by mouth 1-2 times daily for BP & Fluid Retention  Current Outpatient Medications (Respiratory):    promethazine-dextromethorphan (PROMETHAZINE-DM) 6.25-15 MG/5ML syrup, Take 2.5 mLs by mouth 4 (four) times daily as needed for cough.  Current Outpatient Medications (Analgesics):    acetaminophen (TYLENOL) 650 MG CR tablet, Take 650 mg by mouth every 8 (eight) hours as needed for pain.   aspirin 81 MG tablet, Take 81 mg by mouth daily.   Current Outpatient Medications (Other):    anastrozole (ARIMIDEX) 1 MG tablet, Take 1 tablet (1 mg total) by mouth daily.   Biotin 5000 MCG CHEW, Chew 5,000 mcg by mouth daily.   Calcium Carbonate-Vit D-Min (CALCIUM 1200  PO), Take by mouth.   cholecalciferol (VITAMIN D3) 25 MCG (1000 UNIT) tablet, Take 3,000 Units by mouth daily.   cyclobenzaprine (FLEXERIL) 5 MG tablet, Take 1 tablet (5 mg total) by mouth 3 (three) times daily as needed for muscle spasms.   diclofenac Sodium (VOLTAREN) 1 % GEL, Apply 4 g topically 4 (four) times daily. (Patient taking differently: Apply 4 g topically 4 (four) times daily as needed (pain).)   dicyclomine (BENTYL) 20 MG tablet, TAKE 1 TABLET BY MOUTH THREE TIMES DAILY AS NEEDED FOR NAUSEA , BLOATING, CRAMPING OR DIARRHEA   dorzolamide-timolol (COSOPT) 22.3-6.8 MG/ML ophthalmic solution, Place 1 drop into both eyes 2 (two) times daily.   famotidine (PEPCID) 40 MG tablet, Take 1 tablet (40 mg total) by mouth daily.   gabapentin (NEURONTIN) 800 MG tablet, Take 1 tablet (800 mg total) by mouth 3 (three) times daily with meals and 2 tablets at bedtime for chronic pain and hot flashes   Krill Oil 500 MG CAPS, Take by mouth.   Multiple Vitamin (MULTIVITAMIN) tablet, Take 1 tablet by mouth daily.   mupirocin ointment (BACTROBAN) 2 %, Apply to left 3rd toe once daily   ondansetron (ZOFRAN) 4 MG tablet, Take  1 tablet  3 x /day  if needed for nausea & Vomiting   OVER THE COUNTER MEDICATION, Elimi Fat   4 gummies   pantoprazole (PROTONIX) 40 MG tablet, Take 1 tablet (40 mg total) by mouth daily to prevent heartburn and indigestion   Probiotic Product (PROBIOTIC DAILY PO), Take 1 capsule by mouth daily.  Medical History:  Past Medical History:  Diagnosis Date   Anemia    Arthritis    Chest tightness    Fatigue    GERD (gastroesophageal reflux disease)    Heart palpitations 12/06/2019   Hyperlipidemia    Hypothyroid    Internal hemorrhoids 05/04/2009   Qualifier: Diagnosis of  By: Nelson-Smith CMA (AAMA), Dottie     Melanoma [facility]) 2013   Excision/surgery only. No other treatment   Myalgia    Neuromuscular disorder ([place])    Neuropathy   Obesity    Pre-diabetes    Prediabetes     Ulcer    Varicose veins    Health Maintenance:   Immunization History  Administered Date(s) Administered   Influenza, High Dose Seasonal PF 08/19/2014, 09/10/2015, 07/26/2016, 08/10/2017, 08/14/2018, 08/27/2019, 09/10/2020, 10/12/2021, 08/09/2022   Pneumococcal Conjugate-13 05/12/2014   Pneumococcal Polysaccharide-23 09/05/2007, 06/02/2015   Preventative care: Last colonoscopy: 2011, Past Due 2021,   Last mammogram: 01/05/22 Last pap smear/pelvic exam: 02/2011- declines another DEXA: 07/2012, normal  Heart cath June 1999, no blockages, coronary spasms  Prior vaccinations: TD or Tdap: ALLERGY- just swollen arm  Influenza: 09/2021 Pneumococcal:  2016 Prevnar13: 2015  Shingrix 45 dollars a shot, discussed. Covid 19: plans to get through [facility]  Personal history of 06/2020  Derm follows skin surgery center, hx of melanoma to [facility] sh[facility], goes annually, last 2021 Dr. Nicki Reaper for eye care, checking for glaucoma, 01/11/22 Dr. Johney [place] Dentist  Patient Care Team: Unk Pinto, MD as PCP - General (Internal Medicine) Buford Dresser, MD as PCP - Cardiology (Cardiology) Macarthur Critchley, [place] as Referring Physician (Optometry) Delfina Redwood as Referring Physician (Physician Assistant) [name], MD as Consulting Physician (General Surgery) Nicholas Lose, MD as Consulting Physician (Hematology and Oncology) Eppie Gibson, MD as Attending Physician (Radiation Oncology) Carleene Mains, [facility] as Pharmacist (Pharmacist)  Allergies Allergies  Allergen Reactions   Procaine Hcl Shortness Of Breath    Ok to use sq lidocaine for iv starts   Ciprofloxacin     Unknown reaction   Macrobid [Nitrofurantoin Macrocrystal]     Unknown reaction   Sulfonamide Derivatives Hives   Tetanus Toxoid Swelling    REACTION: arm swells   Tuberculin Ppd Swelling   Penicillins Rash    SURGICAL HISTORY She  has a past surgical history that includes Abdominal hysterectomy; Tonsillectomy and  adenoidectomy; Cataract extraction (Bilateral); Nasal sinus surgery; Rotator cuff repair (Left, more than 7 years ago); Cyst removal hand (Left, 07/30/2018); and Breast lumpectomy with radioactive seed localization (Right, 02/24/2022). FAMILY HISTORY Her family history includes Breast cancer in her cousin; Coronary artery disease (age of onset: 51) in her father; Dementia in her maternal aunt and mother; Lung cancer in her maternal grandmother and mother. SOCIAL HISTORY She  reports that she quit smoking about 9 years ago. Her smoking use included cigarettes. She started smoking about 57 years ago. She has a 25.00 pack-year smoking history. She has never used smokeless tobacco. She reports that she does not currently use alcohol. She reports that she does not use drugs.  MEDICARE WELLNESS OBJECTIVES: Physical activity:   Cardiac risk factors:   Depression/mood screen:      01/27/2022    2:40 PM  Depression screen PHQ 2/9  Decreased Interest 0  Down, Depressed, Hopeless 1  PHQ - 2 Score 1    ADLs:     02/18/2022    1:51 PM 02/18/2022    1:48 PM  In your present state of health, do you have any difficulty performing the following activities:  Hearing?  0  Vision?  0  Difficulty concentrating or making decisions?  0  Walking or climbing stairs?  1  Comment  Due to neuropathy and arthritis  Dressing or bathing?  0  Doing errands, shopping? 0      Cognitive Testing  Alert? Yes  Normal Appearance?Yes  Oriented to person? Yes  Place? Yes   Time? Yes  Recall of three objects?  Yes  Can perform simple calculations? Yes  Displays appropriate judgment?Yes  Can read the correct time from a watch face?Yes  EOL planning:      Review of Systems:  Review of Systems  Constitutional:  Negative for malaise/fatigue and weight loss.  HENT:  Negative for hearing loss and tinnitus.   Eyes:  Negative for blurred vision and double vision.  Respiratory:  Negative for cough, sputum production,  shortness of breath and wheezing.   Cardiovascular:  Negative for chest pain, palpitations, orthopnea, claudication, leg swelling and PND.  Gastrointestinal:  Negative for abdominal pain, blood in stool, constipation, diarrhea, heartburn, melena, nausea and vomiting.  Genitourinary: Negative.  Musculoskeletal:  Positive for joint pain (knees). Negative for falls and myalgias.  Skin:  Negative for rash.  Neurological:  Positive for tingling (bil feet). Negative for dizziness, sensory change, weakness and headaches.  Endo/Heme/Allergies:  Negative for polydipsia.  Psychiatric/Behavioral: Negative.  Negative for depression, memory loss, substance abuse and suicidal ideas. The patient is not nervous/anxious and does not have insomnia.   All other systems reviewed and are negative.   Physical Exam: There were no vitals taken for this visit. Wt Readings from Last 3 Encounters:  12/07/22 228 lb 3.2 oz (103.5 kg)  11/08/22 230 lb (104.3 kg)  11/04/22 227 lb 12.8 oz (103.3 kg)   General Appearance: Well nourished, in no apparent distress. Eyes: PERRLA, EOMs, conjunctiva no swelling or erythema Sinuses: No Frontal/maxillary tenderness ENT/Mouth: Ext aud canals clear, TMs without erythema, bulging. No erythema, swelling, or exudate on post pharynx.  Tonsils not swollen or erythematous. Hearing normal.  Neck: Supple, thyroid normal.  Respiratory: Respiratory effort normal, BS equal bilaterally without rales, rhonchi, wheezing or stridor.  Cardio: RRR with no MRGs. Brisk peripheral pulses with mild edema bil lower legs  Abdomen: Soft, + BS,  Non tender, no guarding, rebound, hernias, masses. Lymphatics: Non tender without lymphadenopathy.  Musculoskeletal: Full ROM, 5/5 strength, normal steady gait Skin:  Warm, dry without rashes, lesions, ecchymosis.  Neuro: Cranial nerves intact. Normal muscle tone, no cerebellar symptoms.Bil feet with diminished sensation throughout to monofilament Psych: Awake  and oriented X 3, normal affect, Insight and Judgment appropriate.  Breasts: declines today GU: no concerns, defer    Ruthmary Occhipinti Mikki Santee, NP 3:30PM [facility]

## 2023-02-09 ENCOUNTER — Ambulatory Visit: Payer: PPO | Admitting: Nurse Practitioner

## 2023-02-09 ENCOUNTER — Other Ambulatory Visit: Payer: Self-pay

## 2023-02-09 ENCOUNTER — Other Ambulatory Visit (HOSPITAL_COMMUNITY): Payer: Self-pay

## 2023-02-09 DIAGNOSIS — Z79899 Other long term (current) drug therapy: Secondary | ICD-10-CM

## 2023-02-09 DIAGNOSIS — Z85828 Personal history of other malignant neoplasm of skin: Secondary | ICD-10-CM

## 2023-02-09 DIAGNOSIS — E039 Hypothyroidism, unspecified: Secondary | ICD-10-CM

## 2023-02-09 DIAGNOSIS — E1122 Type 2 diabetes mellitus with diabetic chronic kidney disease: Secondary | ICD-10-CM

## 2023-02-09 DIAGNOSIS — E1169 Type 2 diabetes mellitus with other specified complication: Secondary | ICD-10-CM

## 2023-02-09 DIAGNOSIS — K219 Gastro-esophageal reflux disease without esophagitis: Secondary | ICD-10-CM

## 2023-02-09 DIAGNOSIS — C50411 Malignant neoplasm of upper-outer quadrant of right female breast: Secondary | ICD-10-CM

## 2023-02-09 DIAGNOSIS — I700 Atherosclerosis of aorta: Secondary | ICD-10-CM

## 2023-02-09 DIAGNOSIS — Z87891 Personal history of nicotine dependence: Secondary | ICD-10-CM

## 2023-02-09 DIAGNOSIS — N1831 Chronic kidney disease, stage 3a: Secondary | ICD-10-CM

## 2023-02-09 DIAGNOSIS — M1710 Unilateral primary osteoarthritis, unspecified knee: Secondary | ICD-10-CM

## 2023-02-09 DIAGNOSIS — G9009 Other idiopathic peripheral autonomic neuropathy: Secondary | ICD-10-CM

## 2023-02-09 DIAGNOSIS — R0989 Other specified symptoms and signs involving the circulatory and respiratory systems: Secondary | ICD-10-CM

## 2023-02-09 DIAGNOSIS — I872 Venous insufficiency (chronic) (peripheral): Secondary | ICD-10-CM

## 2023-02-09 DIAGNOSIS — E559 Vitamin D deficiency, unspecified: Secondary | ICD-10-CM

## 2023-02-09 DIAGNOSIS — Z0000 Encounter for general adult medical examination without abnormal findings: Secondary | ICD-10-CM

## 2023-02-11 NOTE — Progress Notes (Unsigned)
MEDICARE ANNUAL WELLNESS    Assessment and Plan:   Encounter for Medicare annual wellness Yearly  Malignant neoplasm of upper outer quadrant of right breast(HCC) - Currently on Anastrazole - Continue to follow with oncology, Dr. Ninfa Linden at [facility]    Controlled type 2 diabetes mellitus with complication, without long-term current use of insulin ([place]) Discussed general issues about diabetes pathophysiology and management., Educational material distributed., Suggested low cholesterol diet., Encouraged aerobic exercise., Discussed foot care., Reminded to get yearly retinal exam. -     Hemoglobin A1c ([facility])  Hyperlipidemia associated with T2DM (HCC) check lipids, statin/zetia for LDL goal <70 decrease fatty foods increase activity.  -    Lipid panel   CKD stage 3 secondary to diabetes (HCC) Increase fluids, avoid NSAIDS, monitor sugars, will monitor -    CMP/GFR   Atherosclerosis of aorta ([place]) Per CT 03/23/17 Control blood pressure, cholesterol, glucose, increase exercise.   Pulmonary emphysema per CT 06/29/22(HCC) No current symptoms Continue yearly CT lung cancer screen  CAD Cardiology following Control blood pressure, cholesterol, glucose, increase exercise.  Go to the ER if any chest pain, shortness of breath, nausea, dizziness.  Morbid obesity BMI 35 with comorbidity of Type 2 DM, HTN, HLD ([place]) - follow up 3 months for progress monitoring - increase veggies, decrease carbs - long discussion about weight loss, diet, and exercise  Labile hypertension - continue medications, DASH diet, exercise and monitor at home. Call if greater than 130/80.  -     CBC with Diff -     COMPLETE METABOLIC PANEL WITH GFR  Hypothyroidism, unspecified type -     TSH Hypothyroidism-check TSH level, continue medications the same, reminded to take on an empty stomach 30-83mins before food.   Medication management Continued  Vitamin D deficiency Continue Vit D supplementation to  maintain value in therapeutic level of 60-100   History of tobacco use -23 pack year Monitor, had benign appearance with emphysema changes noted on CT 06/2022 Repeat due in 1 year  Chronic venous insufficiency - weight loss discussed, continue compression stockings and elevation  History of basal cell carcinoma (BCC) Follow up derm  Gastroesophageal reflux disease with esophagitis without hemorrhage Continue PPI/H2 blocker, diet discussed  Diverticulosis of large intestine without hemorrhage Bowel management, soluble fiber, monitor   Painful Diabetic Peripheral neuropathy(HCC) Monitor- follows with Dr. Adah Perl Was on Gabapentin but not working as well Rx for Lyrica 75 mg TID- has used with success in the past  Primary osteoarthritis of knee, unspecified laterality Ortho following, monitoring at this time     Continue diet and meds as discussed. Further disposition pending results of labs. Over 30 minutes of face to face interview, exam, counseling, chart review, and critical decision making was performed  Future Appointments  Date Time Provider [place]  03/30/2023  2:45 PM Nicholas Lose, MD [facility] None  04/04/2023  3:15 PM Marzetta Board, DPM [facility]  06/06/2023  3:15 PM Marzetta Board, DPM [facility]  08/08/2023  2:15 PM Marzetta Board, DPM [facility]  11/09/2023  2:00 PM Alycia Rossetti, NP [facility] None  05/13/2024  3:00 PM Alycia Rossetti, NP [facility] None    HPI 77 y.o. female  presents for Medicare annual wellness and 3 month follow up. She has Diverticulosis of large intestine; History of tobacco use; Hypothyroidism; Controlled type 2 diabetes mellitus with complication, without long-term current use of insulin ([place]); Morbid obesity ([place]); Chronic venous insufficiency; Peripheral autonomic neuropathy of unknown cause;  Labile hypertension; History of basal cell carcinoma (BCC); Osteoarthritis of knee;  Atherosclerosis of aorta ([place]) by CT scan in 2019; Vitamin D deficiency; Medication management; Gastroesophageal reflux disease with esophagitis; Type 2 diabetes mellitus with hyperlipidemia ([place]); CKD stage 3 secondary to diabetes ([place]); Family history of heart disease; CAD (coronary artery disease); and Malignant neoplasm of upper-outer quadrant of right breast in female, estrogen receptor positive ([place]) on their problem list.  She is widowed, 3 grandchildren, 1 great grandson - 19 months.  She is retired from [employer] -   She is former smoker - 23.5 pack year hisotry, quit 2015, strong family hx of lung cancer, she had CT chest for coronary in 12/26/2019 which showed benign appearing nodules. CT lung cancer screening 06/29/22 showed multiple benign nodules throughout lungs bilaterally. Due for repeat 06/2023   BMI is Body mass index is 35.41 kg/m., she is working on diet and exercise, trying to watch what she eats and smaller portions. Down from 246 lb in 07/2018. She is limiting simple carbs, drinking more more water, walking more.  Wt Readings from Last 3 Encounters:  02/13/23 219 lb 6.4 oz (99.5 kg)  12/07/22 228 lb 3.2 oz (103.5 kg)  11/08/22 230 lb (104.3 kg)   She is following with Dr. Andree Elk for CAD; in 01/2020 she had cardiac CT 12/27/19  showing CCS of 693, 78th % for age/gender, Calcified plaque causing moderate stenosis in the prox LAD, distal RCA, proximal LCx, moderate stenosis, and also aortic atherosclerosis   Her blood pressure has been controlled at home, has been good, today their BP is BP: 118/62  BP Readings from Last 3 Encounters:  02/13/23 118/62  02/06/23 (!) 104/55  12/07/22 134/72  She does workout. She denies chest pain, shortness of breath, dizziness.  She had invasive ductal carcinoma of breast right and is followed by Dr. Ninfa Linden at [facility]. She had right breast lumpectomy 02/2022. She is currently on Anastrazole.   She has been working on diet and exercise  for Diabetes, doing lifestyle changes  with diabetic chronic kidney disease she is on ACE/ARB With hyperlipidemia IS at goal less than 70, she taking atorvastatin 40 mg and ezetimibe 10 mg  she is on bASA Has idiopathic paraesthsias and is on gabapentin but does not think it - follows with Dr. Adah Perl every 90 days,  Eye exam Dr. Nicki Reaper - saw recently, report requested  denies polydipsia, polyuria and visual disturbances.   Last A1C was:  Lab Results  Component Value Date   HGBA1C 6.7 (H) 11/08/2022   Lab Results  Component Value Date   EGFR 63 11/08/2022    Lab Results  Component Value Date   CHOL 127 11/08/2022   HDL 42 (L) 11/08/2022   LDLCALC 57 11/08/2022   TRIG 224 (H) 11/08/2022   CHOLHDL 3.0 11/08/2022   Patient is on Vitamin D supplement.   Lab Results  Component Value Date   VD25OH 68 11/08/2022     She is on thyroid medication. Her medication is levothyroxine 100 mcg daily, takes on empty stomach with water only.   Lab Results  Component Value Date   TSH 0.39 (L) 11/08/2022  .     Current Medications:   Current Outpatient Medications (Endocrine & Metabolic):    levothyroxine (SYNTHROID) 100 MCG tablet, Take  1 whole  tablet  Daily  on an empty stomach with only water for 30 minutes & no Antacid meds, Calcium or Magnesium for 4 hours & avoid Biotin  Current Outpatient Medications (Cardiovascular):    atorvastatin (LIPITOR) 40 MG tablet, Take 1 tablet (40 mg total) by mouth daily for cholesterol   ezetimibe (ZETIA) 10 MG tablet, Take 1 tablet (10 mg total) by mouth daily for Cholesterol   furosemide (LASIX) 40 MG tablet, Take 1 tablet (40 mg total) by mouth 1-2 times daily for BP & Fluid Retention   Current Outpatient Medications (Analgesics):    acetaminophen (TYLENOL) 650 MG CR tablet, Take 650 mg by mouth every 8 (eight) hours as needed for pain.   aspirin 81 MG tablet, Take 81 mg by mouth daily.   Current Outpatient Medications (Other):     anastrozole (ARIMIDEX) 1 MG tablet, Take 1 tablet (1 mg total) by mouth daily.   Biotin 5000 MCG CHEW, Chew 5,000 mcg by mouth daily.   Calcium Carbonate-Vit D-Min (CALCIUM 1200 PO), Take by mouth.   cholecalciferol (VITAMIN D3) 25 MCG (1000 UNIT) tablet, Take 3,000 Units by mouth daily.   diclofenac Sodium (VOLTAREN) 1 % GEL, Apply 4 g topically 4 (four) times daily. (Patient taking differently: Apply 4 g topically 4 (four) times daily as needed (pain).)   dicyclomine (BENTYL) 20 MG tablet, TAKE 1 TABLET BY MOUTH THREE TIMES DAILY AS NEEDED FOR NAUSEA , BLOATING, CRAMPING OR DIARRHEA   dorzolamide-timolol (COSOPT) 22.3-6.8 MG/ML ophthalmic solution, Place 1 drop into both eyes 2 (two) times daily.   famotidine (PEPCID) 40 MG tablet, Take 1 tablet (40 mg total) by mouth daily.   Multiple Vitamin (MULTIVITAMIN) tablet, Take 1 tablet by mouth daily.   ondansetron (ZOFRAN) 4 MG tablet, Take  1 tablet  3 x /day  if needed for nausea & Vomiting   OVER THE COUNTER MEDICATION, Circulation & Vein Support   pantoprazole (PROTONIX) 40 MG tablet, Take 1 tablet (40 mg total) by mouth daily to prevent heartburn and indigestion   Probiotic Product (PROBIOTIC DAILY PO), Take 1 capsule by mouth daily.   cyclobenzaprine (FLEXERIL) 5 MG tablet, Take 1 tablet (5 mg total) by mouth 3 (three) times daily as needed for muscle spasms. (Patient not taking: Reported on 02/13/2023)   pregabalin (LYRICA) 75 MG capsule, Take 1 capsule (75 mg total) by mouth 3 (three) times daily.  Medical History:  Past Medical History:  Diagnosis Date   Anemia    Arthritis    Chest tightness    Fatigue    GERD (gastroesophageal reflux disease)    Heart palpitations 12/06/2019   Hyperlipidemia    Hypothyroid    Internal hemorrhoids 05/04/2009   Qualifier: Diagnosis of  By: Nelson-Smith CMA (AAMA), Dottie     Melanoma [facility]) 2013   Excision/surgery only. No other treatment   Myalgia    Neuromuscular disorder ([place])    Neuropathy    Obesity    Pre-diabetes    Prediabetes    Ulcer    Varicose veins    Health Maintenance:   Immunization History  Administered Date(s) Administered   Influenza, High Dose Seasonal PF 08/19/2014, 09/10/2015, 07/26/2016, 08/10/2017, 08/14/2018, 08/27/2019, 09/10/2020, 10/12/2021, 08/09/2022   Pneumococcal Conjugate-13 05/12/2014   Pneumococcal Polysaccharide-23 09/05/2007, 06/02/2015   Health Maintenance  Topic Date Due   COVID-19 Vaccine (1) 03/01/2023 (Originally 01/17/1951)   FOOT EXAM  04/04/2023 (Originally 10/19/2022)   Zoster Vaccines- Shingrix (1 of 2) 05/16/2023 (Originally 01/17/1965)   HEMOGLOBIN A1C  05/10/2023   Lung Cancer Screening  06/30/2023   Diabetic kidney evaluation - eGFR measurement  11/09/2023   Diabetic kidney evaluation - Urine ACR  11/09/2023   MAMMOGRAM  01/17/2024   OPHTHALMOLOGY EXAM  01/21/2024   Medicare Annual Wellness (AWV)  02/13/2024   Pneumonia Vaccine 65+ Years old  Completed   INFLUENZA VACCINE  Completed   DEXA SCAN  Completed   Hepatitis C Screening  Completed   HPV VACCINES  Aged Out   DTaP/Tdap/Td  Discontinued   COLONOSCOPY (Pts 45-55yrs Insurance coverage will need to be confirmed)  Discontinued     Derm follows [facility] surgery center, hx of melanoma to [facility], goes annually, last 2021 Dr. Nicki Reaper for eye care, checking for glaucoma, 01/21/23 Dr. Johney Maine Dentist- upcoming appt 03/20/23  Patient Care Team: Unk Pinto, MD as PCP - General (Internal Medicine) Buford Dresser, MD as PCP - Cardiology (Cardiology) Macarthur Critchley, [place] as Referring Physician (Optometry) Delfina Redwood as Referring Physician (Physician Assistant) [name], MD as Consulting Physician (General Surgery) Nicholas Lose, MD as Consulting Physician (Hematology and Oncology) Eppie Gibson, MD as Attending Physician (Radiation Oncology) Carleene Mains, [facility] as Pharmacist (Pharmacist)  Allergies Allergies  Allergen Reactions   Procaine Hcl  Shortness Of Breath    Ok to use sq lidocaine for iv starts   Ciprofloxacin     Unknown reaction   Macrobid [Nitrofurantoin Macrocrystal]     Unknown reaction   Sulfonamide Derivatives Hives   Tetanus Toxoid Swelling    REACTION: arm swells   Tuberculin Ppd Swelling   Penicillins Rash    SURGICAL HISTORY She  has a past surgical history that includes Abdominal hysterectomy; Tonsillectomy and adenoidectomy; Cataract extraction (Bilateral); Nasal sinus surgery; Rotator cuff repair (Left, more than 7 years ago); Cyst removal hand (Left, 07/30/2018); and Breast lumpectomy with radioactive seed localization (Right, 02/24/2022). FAMILY HISTORY Her family history includes Breast cancer in her cousin; Coronary artery disease (age of onset: 63) in her father; Dementia in her maternal aunt and mother; Lung cancer in her maternal grandmother and mother. SOCIAL HISTORY She  reports that she quit smoking about 9 years ago. Her smoking use included cigarettes. She started smoking about 57 years ago. She has a 25.00 pack-year smoking history. She has never used smokeless tobacco. She reports that she does not currently use alcohol. She reports that she does not use drugs.  MEDICARE WELLNESS OBJECTIVES: Physical activity: Current Exercise Habits: Home exercise routine, Type of exercise: walking (cubi), Time (Minutes): 10, Frequency (Times/Week): 5, Weekly Exercise (Minutes/Week): 50, Intensity: Mild, Exercise limited by: neurologic condition(s) Cardiac risk factors: Cardiac Risk Factors include: advanced age (>36men, >41 women);diabetes mellitus;dyslipidemia;hypertension;obesity (BMI >30kg/m2);smoking/ tobacco exposure;sedentary lifestyle Depression/mood screen:      02/13/2023    4:45 PM  Depression screen PHQ 2/9  Decreased Interest 0  Down, Depressed, Hopeless 0  PHQ - 2 Score 0    ADLs:     02/13/2023    4:46 PM 02/18/2022    1:51 PM  In your present state of health, do you have any difficulty  performing the following activities:  Hearing? 0   Vision? 0   Difficulty concentrating or making decisions? 0   Walking or climbing stairs? 1   Dressing or bathing? 0   Doing errands, shopping? 0 0     Cognitive Testing  Alert? Yes  Normal Appearance?Yes  Oriented to person? Yes  Place? Yes   Time? Yes  Recall of three objects?  Yes  Can perform simple calculations? Yes  Displays appropriate judgment?Yes  Can read the correct time from a watch face?Yes  EOL planning: Does Patient Have  a Medical Advance Directive?: Yes Type of Advance Directive: Living will, Healthcare Power of Attorney Does patient want to make changes to medical advance directive?: No - Patient declined Copy of [place] in Chart?: No - copy requested    Review of Systems:  Review of Systems  Constitutional:  Negative for malaise/fatigue and weight loss.  HENT:  Negative for hearing loss and tinnitus.   Eyes:  Negative for blurred vision and double vision.  Respiratory:  Negative for cough, sputum production, shortness of breath and wheezing.   Cardiovascular:  Negative for chest pain, palpitations, orthopnea, claudication, leg swelling and PND.  Gastrointestinal:  Negative for abdominal pain, blood in stool, constipation, diarrhea, heartburn, melena, nausea and vomiting.  Genitourinary: Negative.   Musculoskeletal:  Positive for joint pain (knees). Negative for falls and myalgias.  Skin:  Negative for rash.  Neurological:  Positive for tingling (bil feet and lower legs). Negative for dizziness, sensory change, weakness and headaches.  Endo/Heme/Allergies:  Negative for polydipsia.  Psychiatric/Behavioral: Negative.  Negative for depression, memory loss, substance abuse and suicidal ideas. The patient is not nervous/anxious and does not have insomnia.   All other systems reviewed and are negative.   Physical Exam: BP 118/62   Pulse 66   Temp 97.9 F (36.6 C)   Ht 5\' 6"  (1.676 m)    Wt 219 lb 6.4 oz (99.5 kg)   SpO2 98%   BMI 35.41 kg/m  Wt Readings from Last 3 Encounters:  02/13/23 219 lb 6.4 oz (99.5 kg)  12/07/22 228 lb 3.2 oz (103.5 kg)  11/08/22 230 lb (104.3 kg)   General Appearance: Well nourished, in no apparent distress. Eyes: PERRLA, EOMs, conjunctiva no swelling or erythema Sinuses: No Frontal/maxillary tenderness ENT/Mouth: Ext aud canals clear, TMs without erythema, bulging. No erythema, swelling, or exudate on post pharynx.  Tonsils not swollen or erythematous. Hearing normal.  Neck: Supple, thyroid normal.  Respiratory: Respiratory effort normal, BS equal bilaterally without rales, rhonchi, wheezing or stridor.  Cardio: RRR with no MRGs. Brisk peripheral pulses with mild edema bil lower legs  Abdomen: Soft, + BS,  Non tender, no guarding, rebound, hernias, masses. Lymphatics: Non tender without lymphadenopathy.  Musculoskeletal: Full ROM, 5/5 strength, normal steady gait Skin:  Warm, dry without rashes, lesions, ecchymosis.  Neuro: Cranial nerves intact. Normal muscle tone, no cerebellar symptoms.Bil feet and up to calves with diminished sensation throughout to monofilament Psych: Awake and oriented X 3, normal affect, Insight and Judgment appropriate.      Alycia Rossetti, NP 3:30PM [facility]

## 2023-02-13 ENCOUNTER — Other Ambulatory Visit (HOSPITAL_COMMUNITY): Payer: Self-pay

## 2023-02-13 ENCOUNTER — Ambulatory Visit (INDEPENDENT_AMBULATORY_CARE_PROVIDER_SITE_OTHER): Payer: PPO | Admitting: Nurse Practitioner

## 2023-02-13 ENCOUNTER — Other Ambulatory Visit: Payer: Self-pay

## 2023-02-13 ENCOUNTER — Encounter: Payer: Self-pay | Admitting: Nurse Practitioner

## 2023-02-13 VITALS — BP 118/62 | HR 66 | Temp 97.9°F | Ht 66.0 in | Wt 219.4 lb

## 2023-02-13 DIAGNOSIS — E1140 Type 2 diabetes mellitus with diabetic neuropathy, unspecified: Secondary | ICD-10-CM

## 2023-02-13 DIAGNOSIS — M1710 Unilateral primary osteoarthritis, unspecified knee: Secondary | ICD-10-CM

## 2023-02-13 DIAGNOSIS — Z0001 Encounter for general adult medical examination with abnormal findings: Secondary | ICD-10-CM

## 2023-02-13 DIAGNOSIS — E785 Hyperlipidemia, unspecified: Secondary | ICD-10-CM | POA: Diagnosis not present

## 2023-02-13 DIAGNOSIS — I700 Atherosclerosis of aorta: Secondary | ICD-10-CM

## 2023-02-13 DIAGNOSIS — I2510 Atherosclerotic heart disease of native coronary artery without angina pectoris: Secondary | ICD-10-CM | POA: Diagnosis not present

## 2023-02-13 DIAGNOSIS — Z79899 Other long term (current) drug therapy: Secondary | ICD-10-CM

## 2023-02-13 DIAGNOSIS — N1831 Chronic kidney disease, stage 3a: Secondary | ICD-10-CM

## 2023-02-13 DIAGNOSIS — E559 Vitamin D deficiency, unspecified: Secondary | ICD-10-CM

## 2023-02-13 DIAGNOSIS — E039 Hypothyroidism, unspecified: Secondary | ICD-10-CM | POA: Diagnosis not present

## 2023-02-13 DIAGNOSIS — Z87891 Personal history of nicotine dependence: Secondary | ICD-10-CM | POA: Diagnosis not present

## 2023-02-13 DIAGNOSIS — E1169 Type 2 diabetes mellitus with other specified complication: Secondary | ICD-10-CM | POA: Diagnosis not present

## 2023-02-13 DIAGNOSIS — I10 Essential (primary) hypertension: Secondary | ICD-10-CM | POA: Diagnosis not present

## 2023-02-13 DIAGNOSIS — I872 Venous insufficiency (chronic) (peripheral): Secondary | ICD-10-CM

## 2023-02-13 DIAGNOSIS — Z0000 Encounter for general adult medical examination without abnormal findings: Secondary | ICD-10-CM

## 2023-02-13 DIAGNOSIS — N183 Chronic kidney disease, stage 3 unspecified: Secondary | ICD-10-CM

## 2023-02-13 DIAGNOSIS — K5730 Diverticulosis of large intestine without perforation or abscess without bleeding: Secondary | ICD-10-CM

## 2023-02-13 DIAGNOSIS — C50411 Malignant neoplasm of upper-outer quadrant of right female breast: Secondary | ICD-10-CM

## 2023-02-13 DIAGNOSIS — J439 Emphysema, unspecified: Secondary | ICD-10-CM

## 2023-02-13 DIAGNOSIS — E1122 Type 2 diabetes mellitus with diabetic chronic kidney disease: Secondary | ICD-10-CM | POA: Diagnosis not present

## 2023-02-13 DIAGNOSIS — R6889 Other general symptoms and signs: Secondary | ICD-10-CM | POA: Diagnosis not present

## 2023-02-13 DIAGNOSIS — K219 Gastro-esophageal reflux disease without esophagitis: Secondary | ICD-10-CM

## 2023-02-13 MED ORDER — PREGABALIN 75 MG PO CAPS
75.0000 mg | ORAL_CAPSULE | Freq: Three times a day (TID) | ORAL | 1 refills | Status: DC
  Filled 2023-02-13: qty 270, 90d supply, fill #0
  Filled 2023-06-08 (×2): qty 270, 90d supply, fill #1

## 2023-02-13 MED ORDER — PREGABALIN 75 MG PO CAPS: 75.0000 mg | ORAL_CAPSULE | Freq: Three times a day (TID) | ORAL | 1 refills | Status: DC

## 2023-02-13 NOTE — Patient Instructions (Signed)

## 2023-02-14 ENCOUNTER — Other Ambulatory Visit (HOSPITAL_COMMUNITY): Payer: Self-pay

## 2023-02-14 ENCOUNTER — Other Ambulatory Visit: Payer: Self-pay

## 2023-02-14 ENCOUNTER — Other Ambulatory Visit: Payer: Self-pay | Admitting: Nurse Practitioner

## 2023-02-14 DIAGNOSIS — E039 Hypothyroidism, unspecified: Secondary | ICD-10-CM

## 2023-02-14 LAB — CBC WITH DIFFERENTIAL/PLATELET
Absolute Monocytes: 824 cells/uL (ref 200–950)
Basophils Absolute: 46 cells/uL (ref 0–200)
Basophils Relative: 0.4 %
Eosinophils Absolute: 116 cells/uL (ref 15–500)
Eosinophils Relative: 1 %
HCT: 42.1 % (ref 35.0–45.0)
Hemoglobin: 13.8 g/dL (ref 11.7–15.5)
Lymphs Abs: 3178 cells/uL (ref 850–3900)
MCH: 28.8 pg (ref 27.0–33.0)
MCHC: 32.8 g/dL (ref 32.0–36.0)
MCV: 87.7 fL (ref 80.0–100.0)
MPV: 11.1 fL (ref 7.5–12.5)
Monocytes Relative: 7.1 %
Neutro Abs: 7436 cells/uL (ref 1500–7800)
Neutrophils Relative %: 64.1 %
Platelets: 298 10*3/uL (ref 140–400)
RBC: 4.8 10*6/uL (ref 3.80–5.10)
RDW: 12.9 % (ref 11.0–15.0)
Total Lymphocyte: 27.4 %
WBC: 11.6 10*3/uL — ABNORMAL HIGH (ref 3.8–10.8)

## 2023-02-14 LAB — LIPID PANEL
Cholesterol: 132 mg/dL (ref <200)
HDL: 44 mg/dL — ABNORMAL LOW (ref >=50)
LDL Cholesterol (Calc): 64 mg/dL (calc)
Non-HDL Cholesterol (Calc): 88 mg/dL (calc) (ref <130)
Total CHOL/HDL Ratio: 3 (calc) (ref <5.0)
Triglycerides: 164 mg/dL — ABNORMAL HIGH (ref <150)

## 2023-02-14 LAB — COMPLETE METABOLIC PANEL WITH GFR
AG Ratio: 1.7 (calc) (ref 1.0–2.5)
ALT: 18 U/L (ref 6–29)
AST: 19 U/L (ref 10–35)
Albumin: 4.3 g/dL (ref 3.6–5.1)
Alkaline phosphatase (APISO): 92 U/L (ref 37–153)
BUN/Creatinine Ratio: 17 (calc) (ref 6–22)
BUN: 17 mg/dL (ref 7–25)
CO2: 28 mmol/L (ref 20–32)
Calcium: 10 mg/dL (ref 8.6–10.4)
Chloride: 107 mmol/L (ref 98–110)
Creat: 1.01 mg/dL — ABNORMAL HIGH (ref 0.60–1.00)
Globulin: 2.6 g/dL (calc) (ref 1.9–3.7)
Glucose, Bld: 84 mg/dL (ref 65–99)
Potassium: 4.9 mmol/L (ref 3.5–5.3)
Sodium: 145 mmol/L (ref 135–146)
Total Bilirubin: 0.7 mg/dL (ref 0.2–1.2)
Total Protein: 6.9 g/dL (ref 6.1–8.1)
eGFR: 58 mL/min/{1.73_m2} — ABNORMAL LOW (ref >=60)

## 2023-02-14 LAB — HEMOGLOBIN A1C
Hgb A1c MFr Bld: 6.4 % of total Hgb — ABNORMAL HIGH (ref <5.7)
Mean Plasma Glucose: 137 mg/dL
eAG (mmol/L): 7.6 mmol/L

## 2023-02-14 LAB — TSH: TSH: 0.19 mIU/L — ABNORMAL LOW (ref 0.40–4.50)

## 2023-02-14 MED ORDER — LEVOTHYROXINE SODIUM 100 MCG PO TABS: ORAL_TABLET | ORAL | 1 refills | Status: DC

## 2023-02-16 ENCOUNTER — Encounter: Payer: Self-pay | Admitting: Nurse Practitioner

## 2023-02-16 ENCOUNTER — Other Ambulatory Visit: Payer: Self-pay

## 2023-02-16 ENCOUNTER — Other Ambulatory Visit (HOSPITAL_COMMUNITY): Payer: Self-pay

## 2023-02-17 ENCOUNTER — Other Ambulatory Visit (HOSPITAL_COMMUNITY): Payer: Self-pay

## 2023-02-17 ENCOUNTER — Other Ambulatory Visit: Payer: Self-pay

## 2023-02-20 ENCOUNTER — Other Ambulatory Visit (HOSPITAL_COMMUNITY): Payer: Self-pay

## 2023-02-23 ENCOUNTER — Telehealth: Payer: Self-pay

## 2023-02-23 NOTE — Progress Notes (Signed)
02/23/23 Pt called in and LVM stating her daughter received a VM for her to get her yearly eye exam. Pt states she never received a VM and HC does not note any calls made from our team in regards to this. Pt would like [place] to not call daughter unless there is an emergency. (Can a note be made in her chart to reflect this request?) Pt states she had her eye exam in 01/2023. No further questions or concerns at this time.   Total time spent: 66min

## 2023-03-06 ENCOUNTER — Other Ambulatory Visit (HOSPITAL_COMMUNITY): Payer: Self-pay

## 2023-03-06 MED FILL — Atorvastatin Calcium Tab 40 MG (Base Equivalent): ORAL | 90 days supply | Qty: 90 | Fill #1 | Status: AC

## 2023-03-13 ENCOUNTER — Ambulatory Visit: Payer: PPO | Admitting: Podiatry

## 2023-03-25 NOTE — Progress Notes (Signed)
Patient Care Team: Lucky Cowboy, MD as PCP - General (Internal Medicine) Jodelle Red, MD as PCP - Cardiology (Cardiology) Fredrich Birks, OD as Referring Physician (Optometry) Javier Glazier as Referring Physician (Physician Assistant) Abigail Miyamoto, MD as Consulting Physician (General Surgery) Serena Croissant, MD as Consulting Physician (Hematology and Oncology) Lonie Peak, MD as Attending Physician (Radiation Oncology) Sundra Aland, [facility] as Pharmacist (Pharmacist)  DIAGNOSIS: No diagnosis found.  SUMMARY OF ONCOLOGIC HISTORY: Oncology History  Malignant neoplasm of upper-outer quadrant of right breast in female, estrogen receptor positive (HCC)  02/01/2022 Initial Diagnosis   Screening mammogram detected 0.6 cm right breast mass.  By ultrasound it measured 0.4 cm.  Stereotactic biopsy revealed grade 2 IDC and intermediate grade DCIS ER 100%, PR 100%, HER2 negative, Ki-67 10%   02/02/2022 Cancer Staging   Staging form: Breast, AJCC 8th Edition - Clinical: Stage IA (cT1b, cN0, cM0, G2, ER+, PR+, HER2-) - Signed by Serena Croissant, MD on 02/02/2022 Stage prefix: Initial diagnosis Histologic grading system: 3 grade system   02/24/2022 Surgery   02/24/2022: Right lumpectomy: Grade 2 IDC 8 mm (2 contiguous blocks), intermediate grade DCIS, margins negative, ER 100%, PR 100%, HER2 negative, Ki-67 10%   03/15/2022 -  Anti-estrogen oral therapy   Anastrozole x  5 years     CHIEF COMPLIANT: Follow-up tamoxifen  INTERVAL HISTORY: [name] is a 77 y.o. female is here because of right breast cancer. Currently on tamoxifen therapy. She presents to the clinic for a follow-up.    ALLERGIES:  is allergic to procaine hcl, ciprofloxacin, macrobid [nitrofurantoin macrocrystal], sulfonamide derivatives, tetanus toxoid, tuberculin ppd, and penicillins.  MEDICATIONS:  Current Outpatient Medications  Medication Sig Dispense Refill   acetaminophen (TYLENOL) 650 MG CR  tablet Take 650 mg by mouth every 8 (eight) hours as needed for pain.     anastrozole (ARIMIDEX) 1 MG tablet Take 1 tablet (1 mg total) by mouth daily. 30 tablet 0   aspirin 81 MG tablet Take 81 mg by mouth daily.     atorvastatin (LIPITOR) 40 MG tablet Take 1 tablet (40 mg total) by mouth daily for cholesterol 90 tablet 2   Biotin 5000 MCG CHEW Chew 5,000 mcg by mouth daily.     Calcium Carbonate-Vit D-Min (CALCIUM 1200 PO) Take by mouth.     cholecalciferol (VITAMIN D3) 25 MCG (1000 UNIT) tablet Take 3,000 Units by mouth daily.     cyclobenzaprine (FLEXERIL) 5 MG tablet Take 1 tablet (5 mg total) by mouth 3 (three) times daily as needed for muscle spasms. (Patient not taking: Reported on 02/13/2023) 30 tablet 0   diclofenac Sodium (VOLTAREN) 1 % GEL Apply 4 g topically 4 (four) times daily. (Patient taking differently: Apply 4 g topically 4 (four) times daily as needed (pain).) 4 g 2   dicyclomine (BENTYL) 20 MG tablet TAKE 1 TABLET BY MOUTH THREE TIMES DAILY AS NEEDED FOR NAUSEA , BLOATING, CRAMPING OR DIARRHEA 270 tablet 1   dorzolamide-timolol (COSOPT) 22.3-6.8 MG/ML ophthalmic solution Place 1 drop into both eyes 2 (two) times daily.  1   ezetimibe (ZETIA) 10 MG tablet Take 1 tablet (10 mg total) by mouth daily for Cholesterol 90 tablet 0   famotidine (PEPCID) 40 MG tablet Take 1 tablet (40 mg total) by mouth daily. 90 tablet 2   furosemide (LASIX) 40 MG tablet Take 1 tablet (40 mg total) by mouth 1-2 times daily for BP & Fluid Retention 180 tablet 2   levothyroxine (SYNTHROID)  100 MCG tablet Take  1/2 tab M, W, F and 1  whole  tablet  the other 4 days  on an empty stomach with only water for 30 minutes & no Antacid meds, Calcium or Magnesium for 4 hours & avoid Biotin 90 tablet 1   Multiple Vitamin (MULTIVITAMIN) tablet Take 1 tablet by mouth daily.     ondansetron (ZOFRAN) 4 MG tablet Take  1 tablet  3 x /day  if needed for nausea & Vomiting 60 tablet 1   OVER THE COUNTER MEDICATION  Circulation & Vein Support     pantoprazole (PROTONIX) 40 MG tablet Take 1 tablet (40 mg total) by mouth daily to prevent heartburn and indigestion 90 tablet 1   pregabalin (LYRICA) 75 MG capsule Take 1 capsule (75 mg total) by mouth 3 (three) times daily. 270 capsule 1   Probiotic Product (PROBIOTIC DAILY PO) Take 1 capsule by mouth daily.     No current facility-administered medications for this visit.    PHYSICAL EXAMINATION: ECOG PERFORMANCE STATUS: {CHL ONC ECOG [id number]}  There were no vitals filed for this visit. There were no vitals filed for this visit.  BREAST:*** No palpable masses or nodules in either right or left breasts. No palpable axillary supraclavicular or infraclavicular adenopathy no breast tenderness or nipple discharge. (exam performed in the presence of a chaperone)  LABORATORY DATA:  I have reviewed the data as listed    Latest Ref Rng & Units 02/13/2023    4:56 PM 11/08/2022    3:04 PM 08/09/2022    3:20 PM  CMP  Glucose 65 - 99 mg/dL 84  98  95   BUN 7 - 25 mg/dL 17  12  20    Creatinine 0.60 - 1.00 mg/dL 0.27  2.53  6.64   Sodium 135 - 146 mmol/L 145  144  143   Potassium 3.5 - 5.3 mmol/L 4.9  3.9  4.3   Chloride 98 - 110 mmol/L 107  105  105   CO2 20 - 32 mmol/L 28  30  27    Calcium 8.6 - 10.4 mg/dL 40.3  9.6  9.5   Total Protein 6.1 - 8.1 g/dL 6.9  6.6  6.7   Total Bilirubin 0.2 - 1.2 mg/dL 0.7  0.8  1.0   AST 10 - 35 U/L 19  19  19    ALT 6 - 29 U/L 18  17  18      Lab Results  Component Value Date   WBC 11.6 (H) 02/13/2023   HGB 13.8 02/13/2023   HCT 42.1 02/13/2023   MCV 87.7 02/13/2023   PLT 298 02/13/2023   NEUTROABS 7,436 02/13/2023    ASSESSMENT & PLAN:  No problem-specific Assessment & Plan notes found for this encounter.    No orders of the defined types were placed in this encounter.  The patient has a good understanding of the overall plan. she agrees with it. she will call with any problems that may develop before the  next visit here. Total time spent: 30 mins including face to face time and time spent for planning, charting and co-ordination of care   Sherlyn Lick, CMA 03/25/23    I Janan Ridge am acting as a Neurosurgeon for [employer]  ***

## 2023-03-30 ENCOUNTER — Other Ambulatory Visit (HOSPITAL_COMMUNITY): Payer: Self-pay

## 2023-03-30 ENCOUNTER — Other Ambulatory Visit: Payer: Self-pay

## 2023-03-30 ENCOUNTER — Inpatient Hospital Stay: Payer: PPO | Attending: Hematology and Oncology | Admitting: Hematology and Oncology

## 2023-03-30 VITALS — BP 128/56 | HR 88 | Temp 98.1°F | Resp 17 | Ht 66.0 in | Wt 216.3 lb

## 2023-03-30 DIAGNOSIS — C50411 Malignant neoplasm of upper-outer quadrant of right female breast: Secondary | ICD-10-CM | POA: Insufficient documentation

## 2023-03-30 DIAGNOSIS — Z79811 Long term (current) use of aromatase inhibitors: Secondary | ICD-10-CM | POA: Diagnosis not present

## 2023-03-30 DIAGNOSIS — Z170 Estrogen receptor positive status [ER+]: Secondary | ICD-10-CM | POA: Diagnosis not present

## 2023-03-30 MED ORDER — ANASTROZOLE 1 MG PO TABS
1.0000 mg | ORAL_TABLET | Freq: Every day | ORAL | 3 refills | Status: DC
  Filled 2023-03-30: qty 90, 90d supply, fill #0
  Filled 2023-06-27: qty 90, 90d supply, fill #1
  Filled 2023-09-27 (×2): qty 90, 90d supply, fill #2

## 2023-03-30 NOTE — Assessment & Plan Note (Addendum)
02/24/2022: Right lumpectomy: Grade 2 IDC 8 mm (2 contiguous blocks), intermediate grade DCIS, margins negative, ER 100%, PR 100%, HER2 negative, Ki-67 10%   Treatment plan: 1.  Given the small size and good prognostic profile, we are not doing Oncotype DX. 2. patient chose not to do adjuvant radiation 3. Adjuvant antiestrogen therapy: Anastrozole started 03/15/2022   Anastrozole toxicities: Leg swelling: She has had this problem before but it continues to be an issue in the summer Denies any hot flashes or arthralgias or myalgias  Breast cancer surveillance: Breast exam 03/30/2023: Benign Mammogram 12/27/2022 at [facility]: Benign breast density category B  Return to clinic in 1 year for follow-up

## 2023-04-04 ENCOUNTER — Ambulatory Visit: Payer: PPO | Admitting: Podiatry

## 2023-04-04 ENCOUNTER — Encounter: Payer: Self-pay | Admitting: Podiatry

## 2023-04-04 DIAGNOSIS — M79674 Pain in right toe(s): Secondary | ICD-10-CM

## 2023-04-04 DIAGNOSIS — E119 Type 2 diabetes mellitus without complications: Secondary | ICD-10-CM | POA: Diagnosis not present

## 2023-04-04 DIAGNOSIS — B351 Tinea unguium: Secondary | ICD-10-CM | POA: Diagnosis not present

## 2023-04-04 DIAGNOSIS — L84 Corns and callosities: Secondary | ICD-10-CM

## 2023-04-04 DIAGNOSIS — Z8631 Personal history of diabetic foot ulcer: Secondary | ICD-10-CM | POA: Diagnosis not present

## 2023-04-04 DIAGNOSIS — E1142 Type 2 diabetes mellitus with diabetic polyneuropathy: Secondary | ICD-10-CM

## 2023-04-04 DIAGNOSIS — M79675 Pain in left toe(s): Secondary | ICD-10-CM | POA: Diagnosis not present

## 2023-04-05 ENCOUNTER — Other Ambulatory Visit (HOSPITAL_COMMUNITY): Payer: Self-pay

## 2023-04-05 ENCOUNTER — Other Ambulatory Visit: Payer: Self-pay | Admitting: Internal Medicine

## 2023-04-05 DIAGNOSIS — E782 Mixed hyperlipidemia: Secondary | ICD-10-CM

## 2023-04-05 MED ORDER — EZETIMIBE 10 MG PO TABS
10.0000 mg | ORAL_TABLET | Freq: Every day | ORAL | 0 refills | Status: DC
  Filled 2023-04-05: qty 90, 90d supply, fill #0

## 2023-04-06 ENCOUNTER — Other Ambulatory Visit: Payer: Self-pay

## 2023-04-07 DIAGNOSIS — L814 Other melanin hyperpigmentation: Secondary | ICD-10-CM | POA: Diagnosis not present

## 2023-04-07 DIAGNOSIS — L821 Other seborrheic keratosis: Secondary | ICD-10-CM | POA: Diagnosis not present

## 2023-04-07 DIAGNOSIS — D225 Melanocytic nevi of trunk: Secondary | ICD-10-CM | POA: Diagnosis not present

## 2023-04-07 DIAGNOSIS — I788 Other diseases of capillaries: Secondary | ICD-10-CM | POA: Diagnosis not present

## 2023-04-07 NOTE — Progress Notes (Signed)
ANNUAL DIABETIC FOOT EXAM  Subjective: [name] presents today annual diabetic foot exam.  Chief Complaint  Patient presents with   Nail Problem    [facility] PCP[facility] PCP VST-02/2023    Patient confirms h/o diabetes.  Patient has h/o foot/leg ulcer of left foot.  Patient has been diagnosed with neuropathy.  Risk factors: diabetes, diabetic neuropathy, HTN, CAD, CKD, hyperlipidemia, h/o tobacco use in remission.  Lucky Cowboy, MD is patient's PCP.  Past Medical History:  Diagnosis Date   Anemia    Arthritis    Chest tightness    Fatigue    GERD (gastroesophageal reflux disease)    Heart palpitations 12/06/2019   Hyperlipidemia    Hypothyroid    Internal hemorrhoids 05/04/2009   Qualifier: Diagnosis of  By: Nelson-Smith CMA (AAMA), Dottie     Melanoma [facility]) 2013   Excision/surgery only. No other treatment   Myalgia    Neuromuscular disorder (HCC)    Neuropathy   Obesity    Pre-diabetes    Prediabetes    Ulcer    Varicose veins    Patient Active Problem List   Diagnosis Date Noted   Malignant neoplasm of upper-outer quadrant of right breast in female, estrogen receptor positive (HCC) 02/01/2022   CAD (coronary artery disease) 01/22/2020   Family history of heart disease 12/06/2019   Type 2 diabetes mellitus with hyperlipidemia (HCC) 10/09/2019   CKD stage 3 secondary to diabetes (HCC) 10/09/2019   Gastroesophageal reflux disease with esophagitis 04/01/2019   Vitamin D deficiency 12/03/2018   Medication management 12/03/2018   Atherosclerosis of aorta (HCC) by CT scan in 2019 08/06/2018   Osteoarthritis of knee 06/12/2018   History of basal cell carcinoma (BCC) 08/10/2017   Labile hypertension 02/27/2017   Chronic venous insufficiency 06/02/2015   Peripheral autonomic neuropathy of unknown cause 06/02/2015   Morbid obesity (HCC) 08/19/2014   Hypothyroidism    Controlled type 2 diabetes mellitus with complication, without long-term current use of  insulin (HCC)    History of tobacco use 04/09/2012   Diverticulosis of large intestine 05/04/2009   Past Surgical History:  Procedure Laterality Date   ABDOMINAL HYSTERECTOMY     Total    BREAST LUMPECTOMY WITH RADIOACTIVE SEED LOCALIZATION Right 02/24/2022   Procedure: RIGHT BREAST LUMPECTOMY WITH RADIOACTIVE SEED LOCALIZATION;  Surgeon: Abigail Miyamoto, MD;  Location: [facility] OR;  Service: General;  Laterality: Right;   CATARACT EXTRACTION Bilateral    Dr. Darel Hong   CYST REMOVAL HAND Left 07/30/2018   NASAL SINUS SURGERY     ROTATOR CUFF REPAIR Left more than 7 years ago   Dr. Eulah Pont and Dr. Loreta Ave   TONSILLECTOMY AND ADENOIDECTOMY     Current Outpatient Medications on File Prior to Visit  Medication Sig Dispense Refill   acetaminophen (TYLENOL) 650 MG CR tablet Take 650 mg by mouth every 8 (eight) hours as needed for pain.     anastrozole (ARIMIDEX) 1 MG tablet Take 1 tablet (1 mg total) by mouth daily. 90 tablet 3   aspirin 81 MG tablet Take 81 mg by mouth daily.     atorvastatin (LIPITOR) 40 MG tablet Take 1 tablet (40 mg total) by mouth daily for cholesterol 90 tablet 2   Biotin 5000 MCG CHEW Chew 5,000 mcg by mouth daily.     Calcium Carbonate-Vit D-Min (CALCIUM 1200 PO) Take by mouth.     cholecalciferol (VITAMIN D3) 25 MCG (1000 UNIT) tablet Take 3,000 Units by mouth daily.     cyclobenzaprine (  FLEXERIL) 5 MG tablet Take 1 tablet (5 mg total) by mouth 3 (three) times daily as needed for muscle spasms. 30 tablet 0   diclofenac Sodium (VOLTAREN) 1 % GEL Apply 4 g topically 4 (four) times daily. (Patient taking differently: Apply 4 g topically 4 (four) times daily as needed (pain).) 4 g 2   dicyclomine (BENTYL) 20 MG tablet TAKE 1 TABLET BY MOUTH THREE TIMES DAILY AS NEEDED FOR NAUSEA , BLOATING, CRAMPING OR DIARRHEA 270 tablet 1   dorzolamide-timolol (COSOPT) 22.3-6.8 MG/ML ophthalmic solution Place 1 drop into both eyes 2 (two) times daily.  1   famotidine (PEPCID) 40 MG tablet Take  1 tablet (40 mg total) by mouth daily. 90 tablet 2   furosemide (LASIX) 40 MG tablet Take 1 tablet (40 mg total) by mouth 1-2 times daily for BP & Fluid Retention 180 tablet 2   levothyroxine (SYNTHROID) 100 MCG tablet Take  1/2 tab M, W, F and 1  whole  tablet  the other 4 days  on an empty stomach with only water for 30 minutes & no Antacid meds, Calcium or Magnesium for 4 hours & avoid Biotin 90 tablet 1   Misc Natural Products (LEG VEIN & CIRCULATION) TABS Take by mouth.     Multiple Vitamin (MULTIVITAMIN) tablet Take 1 tablet by mouth daily.     ondansetron (ZOFRAN) 4 MG tablet Take  1 tablet  3 x /day  if needed for nausea & Vomiting 60 tablet 1   OVER THE COUNTER MEDICATION Circulation & Vein Support     pantoprazole (PROTONIX) 40 MG tablet Take 1 tablet (40 mg total) by mouth daily to prevent heartburn and indigestion 90 tablet 1   pregabalin (LYRICA) 75 MG capsule Take 1 capsule (75 mg total) by mouth 3 (three) times daily. 270 capsule 1   Probiotic Product (PROBIOTIC DAILY PO) Take 1 capsule by mouth daily.     No current facility-administered medications on file prior to visit.    Allergies  Allergen Reactions   Procaine Hcl Shortness Of Breath    Ok to use sq lidocaine for iv starts   Ciprofloxacin     Unknown reaction   Macrobid [Nitrofurantoin Macrocrystal]     Unknown reaction   Sulfonamide Derivatives Hives   Tetanus Toxoid Swelling    REACTION: arm swells   Tuberculin Ppd Swelling   Penicillins Rash   Social History   Occupational History   Occupation: [employer]    Employer: [employer]  Tobacco Use   Smoking status: Former    Packs/day: 0.50    Years: 50.00    Additional pack years: 0.00    Total pack years: 25.00    Types: Cigarettes    Start date: 14    Quit date: 05/27/2013    Years since quitting: 9.8   Smokeless tobacco: Never  Vaping Use   Vaping Use: Never used  Substance and Sexual Activity   Alcohol use: Not Currently   Drug use:  Never   Sexual activity: Not Currently   Family History  Problem Relation Age of Onset   Dementia Mother    Lung cancer Mother        dx 27s; smoking hx   Coronary artery disease Father 40   Lung cancer Maternal Grandmother        non-smoker; dx 65s   Dementia Maternal Aunt    Breast cancer Cousin        maternal female cousin; dx 41s  Immunization History  Administered Date(s) Administered   Influenza, High Dose Seasonal PF 08/19/2014, 09/10/2015, 07/26/2016, 08/10/2017, 08/14/2018, 08/27/2019, 09/10/2020, 10/12/2021, 08/09/2022   Pneumococcal Conjugate-13 05/12/2014   Pneumococcal Polysaccharide-23 09/05/2007, 06/02/2015     Review of Systems: Negative except as noted in the HPI.   Objective: There were no vitals filed for this visit.  [name] is a pleasant 77 y.o. female in NAD. AAO X 3.  Vascular Examination: Capillary refill time immediate b/l. Vascular status intact b/l with palpable pedal pulses. Pedal hair absent b/l. No pain with calf compression b/l. Skin temperature gradient WNL b/l. +1 pitting edema noted BLE. No ischemia or gangrene noted b/l LE. No cyanosis or clubbing noted b/l LE.  Neurological Examination: Sensation grossly intact b/l with 10 gram monofilament. Vibratory sensation intact b/l. Pt has subjective symptoms of neuropathy.  Dermatological Examination: Pedal skin with normal turgor, texture and tone b/l.  No open wounds. No interdigital macerations.   Toenails 1-5 b/l thick, discolored, elongated with subungual debris and pain on dorsal palpation.   Pedal skin is warm and supple b/l LE. No open wounds b/l LE. No interdigital macerations noted b/l LE. Toenails 1-5 b/l elongated, discolored, dystrophic, thickened, crumbly with subungual debris and tenderness to dorsal palpation. Hyperkeratotic lesion(s) bilateral 3rd toes and L 2nd toe.  No erythema, no edema, no drainage, no fluctuance. Preulcerative lesion noted R 2nd toe. There is visible  subdermal hemorrhage. There is no surrounding erythema, no edema, no drainage, no odor, no fluctuance.  Musculoskeletal Examination: HAV with bunion deformity noted b/l LE. Severe hammertoe deformity noted 2-5 bilaterally.  Radiographs: None  Last A1c:      Latest Ref Rng & Units 02/13/2023    4:56 PM 11/08/2022    3:04 PM 08/09/2022    3:20 PM 05/03/2022    2:15 PM  Hemoglobin A1C  Hemoglobin-A1c <5.7 % of total Hgb 6.4  6.7  6.0  5.9     Lab Results  Component Value Date   HGBA1C 6.4 (H) 02/13/2023   ADA Risk Categorization: High Risk  Patient has one or more of the following: Loss of protective sensation Absent pedal pulses Severe Foot deformity History of foot ulcer  Assessment: 1. Pain due to onychomycosis of toenails of both feet   2. Pre-ulcerative corn or callous   3. Corns and callosities   4. History of diabetic ulcer of foot   5. Diabetic peripheral neuropathy associated with type 2 diabetes mellitus (HCC)   6. Encounter for diabetic foot exam [facility])     Plan:  -Patient was evaluated and treated. All patient's and/or POA's questions/concerns answered on today's visit. -Diabetic foot examination performed today. -Patient to continue soft, supportive shoe gear daily. -Mycotic toenails 1-5 bilaterally were debrided in length and girth with sterile nail nippers and dremel without incident. -Corn(s) bilateral 3rd toes and L 2nd toe pared utilizing sterile scalpel blade without complication or incident. Total number debrided=3. -Preulcerative lesion pared R 2nd toe utilizing sterile scalpel blade. Total number pared=1. -Patient/POA to call should there be question/concern in the interim. Return in about 3 months (around 07/05/2023).  Freddie Breech, DPM

## 2023-04-24 DIAGNOSIS — C50911 Malignant neoplasm of unspecified site of right female breast: Secondary | ICD-10-CM | POA: Diagnosis not present

## 2023-04-25 ENCOUNTER — Other Ambulatory Visit: Payer: Self-pay

## 2023-04-25 ENCOUNTER — Ambulatory Visit: Payer: PPO | Admitting: Podiatry

## 2023-04-25 ENCOUNTER — Other Ambulatory Visit: Payer: Self-pay | Admitting: Internal Medicine

## 2023-04-25 ENCOUNTER — Other Ambulatory Visit (HOSPITAL_COMMUNITY): Payer: Self-pay

## 2023-04-25 DIAGNOSIS — K219 Gastro-esophageal reflux disease without esophagitis: Secondary | ICD-10-CM

## 2023-04-25 MED ORDER — PANTOPRAZOLE SODIUM 40 MG PO TBEC
40.0000 mg | DELAYED_RELEASE_TABLET | Freq: Every day | ORAL | 1 refills | Status: DC
  Filled 2023-04-25: qty 90, 90d supply, fill #0
  Filled 2023-11-09: qty 90, 90d supply, fill #1

## 2023-05-18 DIAGNOSIS — C50911 Malignant neoplasm of unspecified site of right female breast: Secondary | ICD-10-CM | POA: Diagnosis not present

## 2023-05-23 ENCOUNTER — Ambulatory Visit: Payer: PPO | Admitting: Podiatry

## 2023-05-23 DIAGNOSIS — M2041 Other hammer toe(s) (acquired), right foot: Secondary | ICD-10-CM | POA: Diagnosis not present

## 2023-05-23 DIAGNOSIS — E11621 Type 2 diabetes mellitus with foot ulcer: Secondary | ICD-10-CM | POA: Diagnosis not present

## 2023-05-23 DIAGNOSIS — L97511 Non-pressure chronic ulcer of other part of right foot limited to breakdown of skin: Secondary | ICD-10-CM | POA: Diagnosis not present

## 2023-05-23 MED ORDER — DOXYCYCLINE HYCLATE 100 MG PO CAPS: 100.0000 mg | ORAL_CAPSULE | Freq: Two times a day (BID) | ORAL | 0 refills | Status: AC

## 2023-05-23 NOTE — Progress Notes (Signed)
Chief Complaint  Patient presents with   Nail Problem    Pt is here because she has a blood blister on her 3rd toe that goes down the side   HPI: 77 y.o. female presenting today with concern of a blister on the right third toe.  She acknowledges that she has a hammertoe on this toe.  She stated that this has happened in the past as well.  She does note that she has mupirocin ointment at home.  Denies fever or trauma to toe.  Her most recent HbA1c 1 level on 02/13/2023 was 6.4.  Past Medical History:  Diagnosis Date   Anemia    Arthritis    Chest tightness    Fatigue    GERD (gastroesophageal reflux disease)    Heart palpitations 12/06/2019   Hyperlipidemia    Hypothyroid    Internal hemorrhoids 05/04/2009   Qualifier: Diagnosis of  By: Nelson-Smith CMA (AAMA), Dottie     Melanoma [facility]) 2013   Excision/surgery only. No other treatment   Myalgia    Neuromuscular disorder (HCC)    Neuropathy   Obesity    Pre-diabetes    Prediabetes    Ulcer    Varicose veins     Past Surgical History:  Procedure Laterality Date   ABDOMINAL HYSTERECTOMY     Total    BREAST LUMPECTOMY WITH RADIOACTIVE SEED LOCALIZATION Right 02/24/2022   Procedure: RIGHT BREAST LUMPECTOMY WITH RADIOACTIVE SEED LOCALIZATION;  Surgeon: Abigail Miyamoto, MD;  Location: [facility] OR;  Service: General;  Laterality: Right;   CATARACT EXTRACTION Bilateral    Dr. Darel Hong   CYST REMOVAL HAND Left 07/30/2018   NASAL SINUS SURGERY     ROTATOR CUFF REPAIR Left more than 7 years ago   Dr. Eulah Pont and Dr. Loreta Ave   TONSILLECTOMY AND ADENOIDECTOMY      Allergies  Allergen Reactions   Procaine Hcl Shortness Of Breath    Ok to use sq lidocaine for iv starts   Ciprofloxacin     Unknown reaction   Macrobid [Nitrofurantoin Macrocrystal]     Unknown reaction   Sulfonamide Derivatives Hives   Tetanus Toxoid Swelling    REACTION: arm swells   Tuberculin Ppd Swelling   Penicillins Rash    Smoker?: former smoker, quit 10  years ago   PHYSICAL EXAM: There were no vitals filed for this visit.  General: The patient is alert and oriented x3 in no acute distress.  Dermatology: Skin is warm, dry and supple bilateral lower extremities. Interspaces are clear of maceration and debris.      Wound 1 description:  Location: Distal right third toe.      Depth: To dermis    Wound Border: Hemorrhagic hyperkeratosis with tunneling superficial hematoma on medial and lateral aspect of wound/toe    Odor?:  None    Surrounding Tissue: Minimal edema    Infected?:  Yes    Necrosis?:  No    Pain?:  Minimal to none    Tunneling: Superficial tunneling medial and lateral with blister/hematoma formation    Dimensions (cm): 0.3 x 0.3 x 0.2 cm   Vascular: Pedal pulses are diminished bilateral.  +1 pitting edema bilateral lower legs and ankles  Neurological: Light touch sensation diminished bilateral feet.   Musculoskeletal Exam: Contracture right third toe at the PIP joint which is not fully reducible.     Latest Ref Rng & Units 02/13/2023    4:56 PM 11/08/2022    3:04 PM 08/09/2022  3:20 PM  Hemoglobin A1C  Hemoglobin-A1c <5.7 % of total Hgb 6.4  6.7  6.0    ASSESSMENT / PLAN OF CARE: 1. Type 2 diabetes mellitus with foot ulcer (CODE) (HCC)   2. Skin ulcer of third toe of right foot, limited to breakdown of skin (HCC)   3. Hammertoe of right foot     Meds ordered this encounter  Medications   doxycycline (VIBRAMYCIN) 100 MG capsule    Sig: Take 1 capsule (100 mg total) by mouth 2 (two) times daily for 7 days.    Dispense:  14 capsule    Refill:  0   VAS [place] PAD ABI  The medial and lateral superficial blisters on the right third toe were sharply punctured with a sterile #313 blade today and drained.  The roof was left intact for secondary dressing. The distal right third toe ulceration was sharply debrided of hyperkeratotic and devitalized soft tissue with sterile #312 blade to the level of dermis.  Hemostasis  obtained.  Antibiotic ointment and DSD applied.  Reviewed off-loading with patient.  Continue with open toed shoes for now.  Reviewed daily dressing changes with patient.  She will use a thin amount of her mupirocin ointment on the wound followed by a gauze dressing.  She was instructed to avoid Band-Aids.  Prescription for doxycycline 100 mg 1 tablet p.o. twice daily sent in for 7-day course.  She is past due for preventative ABI studies.  There was a reminder in epic to order these today.  Will go ahead and order these for reference since we currently have a wound on on the right third toe.  She may need to consider surgical correction of the hammertoe to prevent recurrence once this heals in the future.  Discussed risks / concerns regarding ulcer with patient and possible sequelae if left untreated.  Stressed importance of infection prevention at home. Short-term goals are:  resolve infection, off-load ulcer, heal ulcer Long-term goals are:  prevent recurrence, prevent amputation.   Return in about 2 weeks (around 06/06/2023) for R-3 toe ulcer recheck.   Clerance Lav, DPM, FACFAS [facility]     [address] [place], [place] [address]                Office [phone]  Fax [phone]

## 2023-05-26 ENCOUNTER — Encounter: Payer: Self-pay | Admitting: Podiatry

## 2023-05-26 DIAGNOSIS — M2041 Other hammer toe(s) (acquired), right foot: Secondary | ICD-10-CM | POA: Insufficient documentation

## 2023-05-26 DIAGNOSIS — L97511 Non-pressure chronic ulcer of other part of right foot limited to breakdown of skin: Secondary | ICD-10-CM | POA: Insufficient documentation

## 2023-05-26 HISTORY — DX: Other hammer toe(s) (acquired), right foot: M20.41

## 2023-05-29 ENCOUNTER — Other Ambulatory Visit: Payer: Self-pay | Admitting: Internal Medicine

## 2023-05-29 ENCOUNTER — Other Ambulatory Visit (HOSPITAL_COMMUNITY): Payer: Self-pay

## 2023-05-29 DIAGNOSIS — E039 Hypothyroidism, unspecified: Secondary | ICD-10-CM

## 2023-05-29 MED ORDER — LEVOTHYROXINE SODIUM 100 MCG PO TABS
100.0000 ug | ORAL_TABLET | Freq: Every day | ORAL | 1 refills | Status: DC
  Filled 2023-05-29: qty 90, 90d supply, fill #0

## 2023-05-30 ENCOUNTER — Other Ambulatory Visit: Payer: Self-pay

## 2023-05-30 ENCOUNTER — Encounter: Payer: Self-pay | Admitting: Internal Medicine

## 2023-05-30 ENCOUNTER — Telehealth: Payer: Self-pay | Admitting: Podiatry

## 2023-05-30 ENCOUNTER — Other Ambulatory Visit (HOSPITAL_COMMUNITY): Payer: Self-pay

## 2023-05-30 ENCOUNTER — Ambulatory Visit (INDEPENDENT_AMBULATORY_CARE_PROVIDER_SITE_OTHER): Payer: PPO | Admitting: Internal Medicine

## 2023-05-30 VITALS — BP 118/68 | HR 52 | Temp 97.9°F | Resp 16 | Ht 66.0 in | Wt 216.6 lb

## 2023-05-30 DIAGNOSIS — E785 Hyperlipidemia, unspecified: Secondary | ICD-10-CM

## 2023-05-30 DIAGNOSIS — I10 Essential (primary) hypertension: Secondary | ICD-10-CM

## 2023-05-30 DIAGNOSIS — K5730 Diverticulosis of large intestine without perforation or abscess without bleeding: Secondary | ICD-10-CM | POA: Diagnosis not present

## 2023-05-30 DIAGNOSIS — R1030 Lower abdominal pain, unspecified: Secondary | ICD-10-CM | POA: Diagnosis not present

## 2023-05-30 DIAGNOSIS — I700 Atherosclerosis of aorta: Secondary | ICD-10-CM | POA: Diagnosis not present

## 2023-05-30 DIAGNOSIS — R102 Pelvic and perineal pain: Secondary | ICD-10-CM

## 2023-05-30 DIAGNOSIS — E039 Hypothyroidism, unspecified: Secondary | ICD-10-CM | POA: Diagnosis not present

## 2023-05-30 DIAGNOSIS — E1169 Type 2 diabetes mellitus with other specified complication: Secondary | ICD-10-CM

## 2023-05-30 DIAGNOSIS — E559 Vitamin D deficiency, unspecified: Secondary | ICD-10-CM | POA: Diagnosis not present

## 2023-05-30 DIAGNOSIS — N1831 Chronic kidney disease, stage 3a: Secondary | ICD-10-CM

## 2023-05-30 DIAGNOSIS — E782 Mixed hyperlipidemia: Secondary | ICD-10-CM

## 2023-05-30 DIAGNOSIS — E1122 Type 2 diabetes mellitus with diabetic chronic kidney disease: Secondary | ICD-10-CM | POA: Diagnosis not present

## 2023-05-30 DIAGNOSIS — Z79899 Other long term (current) drug therapy: Secondary | ICD-10-CM | POA: Diagnosis not present

## 2023-05-30 LAB — CBC WITH DIFFERENTIAL/PLATELET
Basophils Absolute: 50 cells/uL (ref 0–200)
Basophils Relative: 0.5 %
Eosinophils Absolute: 119 cells/uL (ref 15–500)
Eosinophils Relative: 1.2 %
Hemoglobin: 13.9 g/dL (ref 11.7–15.5)
MCH: 28.5 pg (ref 27.0–33.0)
MCV: 87.7 fL (ref 80.0–100.0)
MPV: 11.2 fL (ref 7.5–12.5)
Monocytes Relative: 7 %
Neutro Abs: 6326 cells/uL (ref 1500–7800)
Platelets: 283 10*3/uL (ref 140–400)
RBC: 4.87 10*6/uL (ref 3.80–5.10)
RDW: 13.1 % (ref 11.0–15.0)
Total Lymphocyte: 27.4 %
WBC: 9.9 10*3/uL (ref 3.8–10.8)

## 2023-05-30 MED ORDER — DICYCLOMINE HCL 20 MG PO TABS: ORAL_TABLET | ORAL | 0 refills | Status: DC

## 2023-05-30 MED ORDER — ONDANSETRON HCL 4 MG PO TABS: ORAL_TABLET | ORAL | 1 refills | Status: DC

## 2023-05-30 MED ORDER — ATORVASTATIN CALCIUM 40 MG PO TABS: ORAL_TABLET | ORAL | 2 refills | Status: DC

## 2023-05-30 MED ORDER — EZETIMIBE 10 MG PO TABS: ORAL_TABLET | ORAL | 3 refills | Status: DC

## 2023-05-30 MED ORDER — LEVOTHYROXINE SODIUM 100 MCG PO TABS: ORAL_TABLET | ORAL | 3 refills | Status: DC

## 2023-05-30 NOTE — Progress Notes (Signed)
Future Appointments  Date Time Provider Department  05/30/2023                          3 mo ov  2:30 PM Lucky Cowboy, MD [facility]  06/06/2023  3:15 PM Freddie Breech, DPM [facility]  06/12/2023  1:45 PM McCaughan, Dia D, DPM [facility]  07/10/2023  1:20 PM Jodelle Red, MD [facility]  08/08/2023  2:15 PM Freddie Breech, DPM [facility]  10/10/2023  3:15 PM Freddie Breech, DPM [facility]  11/09/2023                        cpe  2:00 PM Raynelle Dick, NP [facility]  04/01/2024  2:45 PM Serena Croissant, MD [facility]  05/13/2024                        wellness   3:00 PM Raynelle Dick, NP [facility]    History of Present Illness:       This very nice 77 y.o. WWF presents for 3 month follow up with labile HTN, HLD, diet controlled T2_DM , Hypothyroidism, and Vitamin D Deficiency. CT scan in 2019 showed Aortic Atherosclerosis.         Patient underwent Rt Breast Lumpectomy Apr 2023 by Dr Magnus Ivan for a Stage Ia Breast cancer and is on Letrozole x 5 years followed by Dr Pamelia Hoit .         Patient is treated for HTN & BP has been controlled at home. Today's BP is  118/68 .   She had a negative Cardiac CT in Dec 2020. Patient has had no complaints of any cardiac type chest pain, palpitations, dyspnea / orthopnea / PND, dizziness, claudication, but does c/o worsening dependent edema.        Hyperlipidemia is controlled with diet & Atorvastatin.  Patient denies myalgias or other med SE's. Last Lipids were at goal:  Lab Results  Component Value Date   CHOL 132 02/13/2023   HDL 44 (L) 02/13/2023   LDLCALC 64 02/13/2023   TRIG 164 (H) 02/13/2023   CHOLHDL 3.0 02/13/2023     Also, the patient has Morbid Obesity (BMI 34.07)  and history of T2_NIDDM managed with diet  (A1c 6.7%  /Dec2023and  in 2016 ) and has had no symptoms of reactive hypoglycemia, diabetic polys, paresthesias or visual blurring.  Last A1c was not at goal:  Lab Results  Component Value Date   HGBA1C 6.4  (H) 02/13/2023                                                     Patient has been on Thyroid Replacement circa 1996.   Further, the patient also has history of Vitamin D Deficiency ("40" on therapy in 2019) and supplements vitamin D without any suspected side-effects. Last vitamin D was near goal (70-100):  Lab Results  Component Value Date   VD25OH 68 11/08/2022       Current Outpatient Medications  Medication Instructions   acetaminophen 650 mg Every 8 hours PRN   anastrozole  1 mg Daily   Aspirin  81 mg Daily   Atorvastatin 40 MG tablet Take  1 tablet  Daily    Calcium -Vit D-Min (C 1200 )  VITAMIN D 3,000 Units Daily   cyclobenzaprine  5 mg 3 times daily PRN   diclofenac  4 g, Topical, 4 times daily   dicyclomine 20 MG tablet Take  1 tablet   3 x /day  as needed   COSOPT  ophth soln 1 drop, Both Eyes, 2 times daily   ezetimibe  10 MG tablet Take  1 tablet  Daily   famotidine 40 mg , Oral, Daily   furosemide  40 MG tablet Take 1 tablet  1-2 times daily f   levothyroxine 100 MCG tablet Take  1 tablet  Daily     LEG VEIN & CIRCULATION TABS Oral   Multiple Vitamin  1 tablet, Oral, Daily   ondansetron  4 MG tablet Take 1 tablet 3 times daily as needed    OTC  -Circulation & Vein Support     pantoprazole 40 MG tablet Take 1 tablet  daily   pregabalin 75 mg 3 times daily   Probiotic Product  1 capsule Daily     Allergies  Allergen Reactions   Ciprofloxacin    Macrobid [Nitrofurantoin Macrocrystal]    Penicillins     REACTION: rash   Procaine Hcl     REACTION: difficulty breathing   Sulfonamide Derivatives     REACTION: hives   Tetanus Toxoid     REACTION: arm swells   Tuberculin Ppd Swelling     PMHx:   Past Medical History:  Diagnosis Date   Anemia    Chest tightness    CKD stage 3 secondary to diabetes (HCC) 10/09/2019   Fatigue    GERD (gastroesophageal reflux disease)    Heart palpitations 12/06/2019   Hyperlipidemia    Hypothyroid    Internal  hemorrhoids 05/04/2009   Myalgia    Obesity    Prediabetes    Ulcer    Varicose veins      Immunization History  Administered Date(s) Administered   Influenza, High Dose  08/10/2017, 08/14/2018, 08/27/2019, 09/10/2020   Pneumococcal -13 05/12/2014   Pneumococcal -23 09/05/2007, 06/02/2015     Past Surgical History:  Procedure Laterality Date   ABDOMINAL HYSTERECTOMY     Total    CATARACT EXTRACTION Bilateral    Dr. Darel Hong   CYST REMOVAL HAND Left 07/30/2018   NASAL SINUS SURGERY     ROTATOR CUFF REPAIR Left more than 7 years ago   Dr. Eulah Pont and Dr. Loreta Ave   TONSILLECTOMY AND ADENOIDECTOMY      FHx:    Reviewed / unchanged  SHx:    Reviewed / unchanged   Systems Review:  Constitutional: Denies fever, chills, wt changes, headaches, insomnia, fatigue, night sweats, change in appetite. Eyes: Denies redness, blurred vision, diplopia, discharge, itchy, watery eyes.  ENT: Denies discharge, congestion, post nasal drip, epistaxis, sore throat, earache, hearing loss, dental pain, tinnitus, vertigo, sinus pain, snoring.  CV: Denies chest pain, palpitations, irregular heartbeat, syncope, dyspnea, diaphoresis, orthopnea, PND, claudication or edema. Respiratory: denies cough, dyspnea, DOE, pleurisy, hoarseness, laryngitis, wheezing.  Gastrointestinal: Denies dysphagia, odynophagia, heartburn, reflux, water brash, abdominal pain or cramps, nausea, vomiting, bloating, diarrhea, constipation, hematemesis, melena, hematochezia  or hemorrhoids. Genitourinary: Denies dysuria, frequency, urgency, nocturia, hesitancy, discharge, hematuria or flank pain. Musculoskeletal: Denies arthralgias, myalgias, stiffness, jt. swelling, pain, limping or strain/sprain.  Skin: Denies pruritus, rash, hives, warts, acne, eczema or change in skin lesion(s). Neuro: No weakness, tremor, incoordination, spasms, paresthesia or pain. Psychiatric: Denies confusion, memory loss or sensory loss. Endo: Denies change  in  weight, skin or hair change.  Heme/Lymph: No excessive bleeding, bruising or enlarged lymph nodes.  Physical Exam  BP 118/68   Pulse (!) 52   Temp 97.9 F (36.6 C)   Resp 16   Ht 5\' 6"  (1.676 m)   Wt 216 lb 9.6 oz (98.2 kg)   SpO2 99%   BMI 34.96 kg/m   Appears  over nourished, well groomed  and in no distress.  Eyes: PERRLA, EOMs, conjunctiva no swelling or erythema. Sinuses: No frontal/maxillary tenderness ENT/Mouth: EAC's clear, TM's nl w/o erythema, bulging. Nares clear w/o erythema, swelling, exudates. Oropharynx clear without erythema or exudates. Oral hygiene is good. Tongue normal, non obstructing. Hearing intact.  Neck: Supple. Thyroid not palpable. Car 2+/2+ without bruits, nodes or JVD. Chest: Respirations nl with BS clear & equal w/o rales, rhonchi, wheezing or stridor.  Cor: Heart sounds normal w/ regular rate and rhythm without sig. murmurs, gallops, clicks or rubs. Peripheral pulses normal and equal  with 1-2 + pretibial edema Lt>Rt. No calf tenderness or homans' sign.  Abdomen: Soft & bowel sounds normal. Non-tender w/o guarding, rebound, hernias, masses or organomegaly.  Lymphatics: Unremarkable.  Musculoskeletal: Full ROM all peripheral extremities, joint stability, 5/5 strength and normal gait.  Skin: Warm, dry without exposed rashes, lesions or ecchymosis apparent.  Neuro: Cranial nerves intact, reflexes equal bilaterally. Sensory-motor testing grossly intact. Tendon reflexes grossly intact.  Pysch: Alert & oriented x 3.  Insight and judgement nl & appropriate. No ideations.  Assessment and Plan:   1. Essential hypertension  - Continue medication, monitor blood pressure at home.  - Continue DASH diet.  Reminder to go to the ER if any CP,  SOB, nausea, dizziness, severe HA, changes vision/speech.  - CBC with Differential/Platelet - COMPLETE METABOLIC PANEL WITH GFR - Magnesium - TSH   2. Hyperlipidemia associated with type 2 diabetes mellitus  (HCC)  - Continue diet/meds, exercise,& lifestyle modifications.  - Continue monitor periodic cholesterol/liver & renal functions  - Lipid panel - TSH   3. Type 2 diabetes mellitus with stage 3a chronic kidney                                                       disease, without long-term current use of insulin (HCC)  - Continue diet, exercise  - Lifestyle modifications.  - Monitor appropriate labs   - Hemoglobin A1C  - Insulin, random   4. Vitamin D deficiency   - Continue supplementation   - VITAMIN D 25 Hydroxy    5. Hypothyroidism  - TSH   6. Aortic atherosclerosis (HCC)  - Lipid panel   7. Medication management  - CBC with Differential/Platelet - COMPLETE METABOLIC PANEL WITH GFR - Magnesium - Lipid panel - TSH - Hemoglobin A1C  - Insulin, random - VITAMIN D 25 Hydroxy        Discussed  regular exercise, BP monitoring, weight control to achieve/maintain BMI less than 25 and discussed med and SE's. Recommended labs to assess and monitor clinical status with further disposition pending results of labs.  I discussed the assessment and treatment plan with the patient. The patient was provided an opportunity to ask questions and all were answered. The patient agreed with the plan and demonstrated an understanding of the instructions.  I provided over 40 minutes of exam, counseling,  chart review and  complex critical decision making.       The patient was advised to call back or seek an in-person evaluation if the symptoms worsen or if the condition fails to improve as anticipated.   Marinus Maw, MD

## 2023-05-30 NOTE — Patient Instructions (Signed)

## 2023-05-31 ENCOUNTER — Other Ambulatory Visit: Payer: Self-pay | Admitting: Internal Medicine

## 2023-05-31 ENCOUNTER — Other Ambulatory Visit: Payer: Self-pay

## 2023-05-31 DIAGNOSIS — K5730 Diverticulosis of large intestine without perforation or abscess without bleeding: Secondary | ICD-10-CM

## 2023-05-31 LAB — CBC WITH DIFFERENTIAL/PLATELET
Absolute Monocytes: 693 cells/uL (ref 200–950)
HCT: 42.7 % (ref 35.0–45.0)
Lymphs Abs: 2713 cells/uL (ref 850–3900)
MCHC: 32.6 g/dL (ref 32.0–36.0)
Neutrophils Relative %: 63.9 %

## 2023-05-31 LAB — LIPID PANEL
Cholesterol: 125 mg/dL (ref <200)
HDL: 45 mg/dL — ABNORMAL LOW (ref >=50)
LDL Cholesterol (Calc): 55 mg/dL (calc)
Non-HDL Cholesterol (Calc): 80 mg/dL (calc) (ref <130)
Total CHOL/HDL Ratio: 2.8 (calc) (ref <5.0)
Triglycerides: 174 mg/dL — ABNORMAL HIGH (ref <150)

## 2023-05-31 LAB — COMPLETE METABOLIC PANEL WITH GFR
AG Ratio: 1.8 (calc) (ref 1.0–2.5)
ALT: 16 U/L (ref 6–29)
AST: 19 U/L (ref 10–35)
Albumin: 4.4 g/dL (ref 3.6–5.1)
Alkaline phosphatase (APISO): 112 U/L (ref 37–153)
BUN/Creatinine Ratio: 16 (calc) (ref 6–22)
BUN: 17 mg/dL (ref 7–25)
CO2: 30 mmol/L (ref 20–32)
Calcium: 9.6 mg/dL (ref 8.6–10.4)
Chloride: 104 mmol/L (ref 98–110)
Creat: 1.09 mg/dL — ABNORMAL HIGH (ref 0.60–1.00)
Globulin: 2.5 g/dL (calc) (ref 1.9–3.7)
Glucose, Bld: 89 mg/dL (ref 65–99)
Potassium: 4.8 mmol/L (ref 3.5–5.3)
Sodium: 144 mmol/L (ref 135–146)
Total Bilirubin: 0.8 mg/dL (ref 0.2–1.2)
Total Protein: 6.9 g/dL (ref 6.1–8.1)
eGFR: 53 mL/min/{1.73_m2} — ABNORMAL LOW (ref >=60)

## 2023-05-31 LAB — INSULIN, RANDOM: Insulin: 19.9 u[IU]/mL — ABNORMAL HIGH

## 2023-05-31 LAB — TSH: TSH: 4.59 mIU/L — ABNORMAL HIGH (ref 0.40–4.50)

## 2023-05-31 LAB — HEMOGLOBIN A1C W/OUT EAG: Hgb A1c MFr Bld: 6.2 % of total Hgb — ABNORMAL HIGH (ref <5.7)

## 2023-05-31 LAB — VITAMIN D 25 HYDROXY (VIT D DEFICIENCY, FRACTURES): Vit D, 25-Hydroxy: 93 ng/mL (ref 30–100)

## 2023-05-31 LAB — MAGNESIUM: Magnesium: 2.5 mg/dL (ref 1.5–2.5)

## 2023-05-31 MED ORDER — ONDANSETRON HCL 4 MG PO TABS
4.0000 mg | ORAL_TABLET | Freq: Three times a day (TID) | ORAL | 1 refills | Status: DC | PRN
  Filled 2023-05-31: qty 90, 30d supply, fill #0

## 2023-06-01 ENCOUNTER — Other Ambulatory Visit (HOSPITAL_COMMUNITY): Payer: Self-pay

## 2023-06-03 ENCOUNTER — Encounter: Payer: Self-pay | Admitting: Internal Medicine

## 2023-06-06 ENCOUNTER — Ambulatory Visit: Payer: PPO | Admitting: Podiatry

## 2023-06-06 ENCOUNTER — Encounter: Payer: Self-pay | Admitting: Podiatry

## 2023-06-06 VITALS — BP 120/46

## 2023-06-06 DIAGNOSIS — B351 Tinea unguium: Secondary | ICD-10-CM | POA: Diagnosis not present

## 2023-06-06 DIAGNOSIS — E1142 Type 2 diabetes mellitus with diabetic polyneuropathy: Secondary | ICD-10-CM

## 2023-06-06 DIAGNOSIS — L97511 Non-pressure chronic ulcer of other part of right foot limited to breakdown of skin: Secondary | ICD-10-CM

## 2023-06-06 DIAGNOSIS — L84 Corns and callosities: Secondary | ICD-10-CM

## 2023-06-06 DIAGNOSIS — M79674 Pain in right toe(s): Secondary | ICD-10-CM

## 2023-06-06 DIAGNOSIS — M79675 Pain in left toe(s): Secondary | ICD-10-CM

## 2023-06-08 ENCOUNTER — Other Ambulatory Visit (HOSPITAL_COMMUNITY): Payer: Self-pay

## 2023-06-09 ENCOUNTER — Other Ambulatory Visit: Payer: Self-pay

## 2023-06-09 ENCOUNTER — Other Ambulatory Visit (HOSPITAL_COMMUNITY): Payer: Self-pay

## 2023-06-12 ENCOUNTER — Ambulatory Visit: Payer: PPO | Admitting: Podiatry

## 2023-06-12 DIAGNOSIS — L97511 Non-pressure chronic ulcer of other part of right foot limited to breakdown of skin: Secondary | ICD-10-CM

## 2023-06-12 DIAGNOSIS — M2042 Other hammer toe(s) (acquired), left foot: Secondary | ICD-10-CM | POA: Diagnosis not present

## 2023-06-12 NOTE — Progress Notes (Signed)
Subjective:  Patient ID: [name], female    DOB: August 16, 1946,  MRN: [id number]  [name] presents to clinic today for at risk foot care. Patient has history of diabetic neuropathy with h/o ulceration. Patient states she came in for an urgent appointment earlier this month. She developed a blood blister on the tip of the right 3rd digit. She took a course of antibiotics and notes improvement in digit.   Patient also states her toe turned red after applying Mupirocin Ointment to it, so she discontinued using it. Chief Complaint  Patient presents with   Nail Problem    rfc   PCP is Lucky Cowboy, MD, and last visit was 05/30/2023.  Allergies  Allergen Reactions   Procaine Hcl Shortness Of Breath    Ok to use sq lidocaine for iv starts   Ciprofloxacin     Unknown reaction   Macrobid [Nitrofurantoin Macrocrystal]     Unknown reaction   Mupirocin Dermatitis    Patient states toe turned red after applying medication.   Sulfonamide Derivatives Hives   Tetanus Toxoid Swelling    REACTION: arm swells   Tuberculin Ppd Swelling   Penicillins Rash   Review of Systems: Negative except as noted in the HPI.  Objective: No changes noted in today's physical examination. Vitals:   06/06/23 1539  BP: (!) 120/46   [name] is a pleasant 77 y.o. female obese in NAD. AAO x 3.  Vascular Examination: Capillary refill time immediate b/l. Vascular status intact b/l with palpable pedal pulses. Pedal hair absent b/l. No pain with calf compression b/l. Skin temperature gradient WNL b/l. +1 pitting edema noted BLE. No ischemia or gangrene noted b/l LE. No cyanosis or clubbing noted b/l LE.  Neurological Examination: Sensation grossly intact b/l with 10 gram monofilament. Vibratory sensation intact b/l. Pt has subjective symptoms of neuropathy.  Dermatological Examination: Pedal skin with normal turgor, texture and tone b/l.  No open wounds. No interdigital macerations.   Toenails  1-5 b/l thick, discolored, elongated with subungual debris and pain on dorsal palpation.   Pedal skin is warm and supple b/l LE. No open wounds b/l LE. No interdigital macerations noted b/l LE. Toenails 1-5 b/l elongated, discolored, dystrophic, thickened, crumbly with subungual debris and tenderness to dorsal palpation.   Hyperkeratotic lesion(s)  L 2nd toe.  No erythema, no edema, no drainage, no fluctuance.   Preulcerative lesion noted R 2nd toe. There is visible subdermal hemorrhage. There is no surrounding erythema, no edema, no drainage, no odor, no fluctuance.   Resolving blood blister distal tip right 3rd digit with roof remaining intact. No erythema, resolving edema. No active drainage with no fluctuance.  Musculoskeletal Examination: HAV with bunion deformity noted b/l LE. Severe hammertoe deformity noted 2-5 bilaterally.  Radiographs: None  Assessment/Plan: 1. Pain due to onychomycosis of toenails of both feet   2. Pre-ulcerative corn or callous   3. Skin ulcer of third toe of right foot, limited to breakdown of skin (HCC)   4. Diabetic peripheral neuropathy associated with type 2 diabetes mellitus (HCC)     -Consent given for treatment as described below: -Examined patient. -Right 3rd digit ulcer resolving with course of doxycycline. She will f/u with Dr. Carlota Raspberry next week. Allergy profile updated to include Mupirocin Ointment. -Continue supportive shoe gear daily. -Toenails 1-5 b/l were debrided in length and girth with sterile nail nippers and dremel without iatrogenic bleeding.  -Corn(s) left second digit pared utilizing sharp debridement with sterile  blade without complication or incident. Total number debrided=1. -Preulcerative lesion pared right second digit utilizing sterile scalpel blade. Total number pared=1. -Patient/POA to call should there be question/concern in the interim.   Return in about 6 days (around 06/12/2023).  Freddie Breech, DPM

## 2023-06-12 NOTE — Progress Notes (Signed)
Chief Complaint  Patient presents with   Ulcer    Pt is here for ulcer recheck on her right foot 3rd toe she states she feels it is doing much better, she also says that Dr.Galaway wants Dr.Laylamarie Meuser to take a look at her 2nd toe left foot     HPI: 77 y.o. female presenting today for follow-up of ulcerated corn on the distal aspect of the right third toe.  She feels that is doing much better.  She finished her antibiotic and is applying antibiotic ointment and a dressing to the area.  States that she has trouble applying a gauze dressing like is performed at the office, so she sticks gauze in the gel toe cap and then places this over her toe.  She states it feels much better.  She also wants her left third toe corn evaluated today.  Past Medical History:  Diagnosis Date   Anemia    Arthritis    Chest tightness    Fatigue    GERD (gastroesophageal reflux disease)    Heart palpitations 12/06/2019   Hyperlipidemia    Hypothyroid    Internal hemorrhoids 05/04/2009   Qualifier: Diagnosis of  By: Nelson-Smith CMA (AAMA), Dottie     Melanoma [place]) 2013   Excision/surgery only. No other treatment   Myalgia    Neuromuscular disorder (HCC)    Neuropathy   Obesity    Pre-diabetes    Prediabetes    Ulcer    Varicose veins     Past Surgical History:  Procedure Laterality Date   ABDOMINAL HYSTERECTOMY     Total    BREAST LUMPECTOMY WITH RADIOACTIVE SEED LOCALIZATION Right 02/24/2022   Procedure: RIGHT BREAST LUMPECTOMY WITH RADIOACTIVE SEED LOCALIZATION;  Surgeon: Abigail Miyamoto, MD;  Location: [facility] OR;  Service: General;  Laterality: Right;   CATARACT EXTRACTION Bilateral    Dr. Darel Hong   CYST REMOVAL HAND Left 07/30/2018   NASAL SINUS SURGERY     ROTATOR CUFF REPAIR Left more than 7 years ago   Dr. Eulah Pont and Dr. Loreta Ave   TONSILLECTOMY AND ADENOIDECTOMY      Allergies  Allergen Reactions   Procaine Hcl Shortness Of Breath    Ok to use sq lidocaine for iv starts    Ciprofloxacin     Unknown reaction   Macrobid [Nitrofurantoin Macrocrystal]     Unknown reaction   Mupirocin Dermatitis    Patient states toe turned red after applying medication.   Sulfonamide Derivatives Hives   Tetanus Toxoid Swelling    REACTION: arm swells   Tuberculin Ppd Swelling   Penicillins Rash    Smoker?: former smoker, quit 10 years ago  PHYSICAL EXAM: There were no vitals filed for this visit.  General: The patient is alert and oriented x3 in no acute distress.  Dermatology: Skin is warm, dry and supple bilateral lower extremities. Interspaces are clear of maceration and debris.  Distal left third toe corn has a very mild hemorrhagic corn present.  There is no ulceration noted.  No surrounding erythema or edema is noted.    Wound 1 description:  Location: Distal right third toe    Depth: Superficial/partial-thickness    Wound Border: Hemorrhagic hyperkeratotic tissue    Odor?:  No    Surrounding Tissue: No edema or erythema    Infected?:  No    Necrosis?:  No    Pain?:  Minimal    Tunneling: No    Dimensions (cm): 0.2 cm  x 0.2 cm x 0.1 cm  Vascular: Pedal pulses are diminished  Neurological: Light touch sensation grossly intact bilateral feet.   Musculoskeletal Exam: There are semirigid contractures of the bilateral third toe at the PIPJ level     Latest Ref Rng & Units 05/30/2023    2:19 PM 02/13/2023    4:56 PM 11/08/2022    3:04 PM 08/09/2022    3:20 PM  Hemoglobin A1C  Hemoglobin-A1c <5.7 % of total Hgb 6.2  6.4  6.7  6.0    ASSESSMENT / PLAN OF CARE: 1. Skin ulcer of third toe of right foot, limited to breakdown of skin (HCC)   2. Hammertoe of left foot     The ulceration was sharply debrided of hyperkeratotic and devitalized soft tissue with sterile #312 blade to the level of dermis.  Hemostasis obtained.  Antibiotic ointment and DSD applied.  Reviewed off-loading with patient.  The left third toe distal corn was also shaved with a sterile #312  blade today.  Reviewed daily dressing changes with patient.  After 1 more week, the right third toe ulceration should be resolved.  If she has no drainage she can discontinue dressing changes at that time.  Discussed risks / concerns regarding ulcer with patient and possible sequelae if left untreated.  Stressed importance of infection prevention at home. Short-term goals are: Prevent infection, off-load ulcer, heal ulcer Long-term goals are:  prevent recurrence, prevent amputation.   Return if symptoms worsen or fail to improve.   Clerance Lav, DPM, FACFAS [facility]     [address] [place], [place] [address]                Office [phone]  Fax [phone]

## 2023-06-27 ENCOUNTER — Other Ambulatory Visit (HOSPITAL_COMMUNITY): Payer: Self-pay

## 2023-07-06 ENCOUNTER — Encounter (HOSPITAL_COMMUNITY): Payer: PPO

## 2023-07-10 ENCOUNTER — Ambulatory Visit (HOSPITAL_BASED_OUTPATIENT_CLINIC_OR_DEPARTMENT_OTHER): Payer: PPO | Admitting: Cardiology

## 2023-07-10 ENCOUNTER — Encounter (HOSPITAL_BASED_OUTPATIENT_CLINIC_OR_DEPARTMENT_OTHER): Payer: Self-pay | Admitting: Cardiology

## 2023-07-10 VITALS — BP 116/62 | HR 51 | Ht 66.0 in | Wt 217.4 lb

## 2023-07-10 DIAGNOSIS — R600 Localized edema: Secondary | ICD-10-CM

## 2023-07-10 DIAGNOSIS — E1169 Type 2 diabetes mellitus with other specified complication: Secondary | ICD-10-CM | POA: Diagnosis not present

## 2023-07-10 DIAGNOSIS — E785 Hyperlipidemia, unspecified: Secondary | ICD-10-CM

## 2023-07-10 DIAGNOSIS — I2510 Atherosclerotic heart disease of native coronary artery without angina pectoris: Secondary | ICD-10-CM

## 2023-07-10 DIAGNOSIS — I10 Essential (primary) hypertension: Secondary | ICD-10-CM | POA: Diagnosis not present

## 2023-07-10 NOTE — Patient Instructions (Signed)

## 2023-07-10 NOTE — Progress Notes (Signed)
Cardiology Office Note:  .    Date:  07/10/2023  ID:  [name], DOB [birth year], MRN [id number] PCP: Lucky Cowboy, MD  [facility] Providers Cardiologist:  Jodelle Red, MD     History of Present Illness: .    [name] is a 77 y.o. female with a hx of nonobstructive CAD, type II diabetes, hypertension, hyperlipidemia, obesity, GERD who is seen for follow up today. She was initially seen 12/06/19 as a new consult at the request of Lucky Cowboy, MD for the evaluation and management of palpitations. Also noted to have prior ER visit for chest pain.   In 02/2022 she underwent a lumpectomy. She was on anastrozole. Still had some hot flashes, but not as severe.   At her visit 06/2022, we reviewed the cardiac findings on her CT scan of her lungs 06/29/22. Her edema had noticeably improved with taking 2 tablets of furosemide in the mornings. Continued furosemide, and recommend cutting back to once daily once swelling stabilized (takes extra in the summer).   Today, she is feeling good. Her blood pressure is well controlled today at 116/62. She reports that her leg swelling is about the same. She notes that she has returned to the anastrozole 1 mg daily (had tried a similar medication but developed worse side effects). Her swelling is not firm today and she states that it usually improves in the morning.   She denies any palpitations, chest pain, shortness of breath, lightheadedness, headaches, syncope, orthopnea, or PND.  ROS:  Please see the history of present illness. ROS otherwise negative except as noted.  (+) Bilateral LE edema  Studies Reviewed: Marland Kitchen    EKG Interpretation Date/Time:  Monday July 10 2023 13:21:16 EDT Ventricular Rate:  51 PR Interval:  130 QRS Duration:  96 QT Interval:  422 QTC Calculation: 388 R Axis:   -27  Text Interpretation: Sinus bradycardia Poor R wave progression Confirmed by Jodelle Red [id number]) on 07/10/2023 1:25:36 PM     Physical Exam:    VS:  BP 116/62 (BP Location: Left Arm, Patient Position: Sitting, Cuff Size: Large)   Pulse (!) 51   Ht 5\' 6"  (1.676 m)   Wt 217 lb 6.4 oz (98.6 kg)   BMI 35.09 kg/m    Wt Readings from Last 3 Encounters:  07/10/23 217 lb 6.4 oz (98.6 kg)  05/30/23 216 lb 9.6 oz (98.2 kg)  03/30/23 216 lb 4.8 oz (98.1 kg)    GEN: Well nourished, well developed in no acute distress HEENT: Normal, moist mucous membranes NECK: No JVD CARDIAC: regular rhythm, normal S1 and S2, no rubs or gallops. No murmur. VASCULAR: Radial and DP pulses 2+ bilaterally. No carotid bruits RESPIRATORY:  Clear to auscultation without rales, wheezing or rhonchi  ABDOMEN: Soft, non-tender, non-distended MUSCULOSKELETAL:  Ambulates independently SKIN: Warm and dry, trivial LE edema bilaterally NEUROLOGIC:  Alert and oriented x 3. No focal neuro deficits noted. PSYCHIATRIC:  Normal affect   ASSESSMENT AND PLAN: .    Nonobstructive CAD Family history of heart disease -CT with 3 vessel coronary artery disease without significant stenosis -continue aspirin and high intensity atorvastatin 40 mg -has not required nitroglycerin -discussed red flag symptoms, when to seek emergency medical care   Bilateral leg edema: trivial today -compression stockings previously discussed   Hypertension:goal <130/80 -at goal today, to borderline low, though asymptomatic -continue furosemide, once daily   Hyperlipidemia:  -continue atorvastatin 40 mg daily, ezetimibe 10 mg daily -aim for LDL <70  given CAD, last LDL 55 05/2023 -counseled on lifestyle, below   Type II diabetes: -currently diet controlled -consider SGLT2i or GLP1RA in the future given CAD   Cardiac risk counseling and prevention recommendations: -recommend heart healthy/Mediterranean diet, with whole grains, fruits, vegetable, fish, lean meats, nuts, and olive oil. Limit salt. -recommend moderate walking, 3-5 times/week for 30-50 minutes each  session. Aim for at least 150 minutes.week. Goal should be pace of 3 miles/hours, or walking 1.5 miles in 30 minutes -recommend avoidance of tobacco products. Avoid excess alcohol.  Dispo: Follow-up in 1 year, or sooner as needed.  I,Mathew Stumpf,acting as a Neurosurgeon for [employer], MD.,have documented all relevant documentation on the behalf of Jodelle Red, MD,as directed by  Jodelle Red, MD while in the presence of Jodelle Red, MD.  I, Jodelle Red, MD, have reviewed all documentation for this visit. The documentation on 07/10/23 for the exam, diagnosis, procedures, and orders are all accurate and complete.   Signed, Jodelle Red, MD

## 2023-07-11 ENCOUNTER — Other Ambulatory Visit: Payer: Self-pay | Admitting: Nurse Practitioner

## 2023-07-11 ENCOUNTER — Other Ambulatory Visit (HOSPITAL_COMMUNITY): Payer: Self-pay

## 2023-07-11 ENCOUNTER — Other Ambulatory Visit: Payer: Self-pay

## 2023-07-11 DIAGNOSIS — E782 Mixed hyperlipidemia: Secondary | ICD-10-CM

## 2023-07-11 MED ORDER — EZETIMIBE 10 MG PO TABS
10.0000 mg | ORAL_TABLET | Freq: Every day | ORAL | 0 refills | Status: DC
  Filled 2023-07-11 – 2023-07-17 (×2): qty 90, 90d supply, fill #0

## 2023-07-17 ENCOUNTER — Other Ambulatory Visit: Payer: Self-pay

## 2023-07-17 ENCOUNTER — Other Ambulatory Visit (HOSPITAL_COMMUNITY): Payer: Self-pay

## 2023-07-28 ENCOUNTER — Other Ambulatory Visit (HOSPITAL_COMMUNITY): Payer: Self-pay

## 2023-08-08 ENCOUNTER — Ambulatory Visit: Payer: PPO | Admitting: Podiatry

## 2023-08-08 ENCOUNTER — Ambulatory Visit: Payer: PPO

## 2023-08-08 DIAGNOSIS — M79675 Pain in left toe(s): Secondary | ICD-10-CM | POA: Diagnosis not present

## 2023-08-08 DIAGNOSIS — B351 Tinea unguium: Secondary | ICD-10-CM

## 2023-08-08 DIAGNOSIS — L97511 Non-pressure chronic ulcer of other part of right foot limited to breakdown of skin: Secondary | ICD-10-CM | POA: Diagnosis not present

## 2023-08-08 DIAGNOSIS — L603 Nail dystrophy: Secondary | ICD-10-CM | POA: Diagnosis not present

## 2023-08-08 DIAGNOSIS — E1142 Type 2 diabetes mellitus with diabetic polyneuropathy: Secondary | ICD-10-CM | POA: Diagnosis not present

## 2023-08-08 DIAGNOSIS — L97521 Non-pressure chronic ulcer of other part of left foot limited to breakdown of skin: Secondary | ICD-10-CM | POA: Diagnosis not present

## 2023-08-08 DIAGNOSIS — L03032 Cellulitis of left toe: Secondary | ICD-10-CM | POA: Diagnosis not present

## 2023-08-08 DIAGNOSIS — M79674 Pain in right toe(s): Secondary | ICD-10-CM

## 2023-08-08 MED ORDER — DOXYCYCLINE HYCLATE 100 MG PO CAPS: 100.0000 mg | ORAL_CAPSULE | Freq: Two times a day (BID) | ORAL | 0 refills | Status: AC

## 2023-08-08 MED ORDER — GENTAMICIN SULFATE 0.1 % EX CREA
TOPICAL_CREAM | CUTANEOUS | 1 refills | Status: DC
Start: 2023-08-08 — End: 2023-09-04

## 2023-08-08 NOTE — Patient Instructions (Signed)
DRESSING CHANGES BILATERAL 3RD TOES:   PHARMACY SHOPPING LIST: Saline or Wound Cleanser for cleaning wound 2 x 2 inch sterile gauze for cleaning wound GENTAMICIN CREAM  A. IF DISPENSED, WEAR SURGICAL SHOE OR WALKING BOOT AT ALL TIMES.  B. IF PRESCRIBED ORAL ANTIBIOTICS, TAKE ALL MEDICATION AS PRESCRIBED UNTIL ALL ARE GONE.  C. IF DOCTOR HAS DESIGNATED NONWEIGHTBEARING STATUS, PLEASE ADHERE TO INSTRUCTIONS.  KEEP BOTH FEET DRY AT ALL TIMES!!!!  CLEANSE ULCER WITH SALINE OR WOUND CLEANSER.  DAB DRY WITH GAUZE SPONGE.  APPLY A LIGHT AMOUNT OF GENTAMICIN CREAM TO BASE OF ULCER.  APPLY OUTER DRESSING AS INSTRUCTED.  WEAR SURGICAL SHOE/BOOT DAILY AT ALL TIMES. IF SUPPLIED, WEAR HEEL PROTECTORS AT ALL TIMES WHEN IN BED.  DO NOT WALK BAREFOOT!!!  IF YOU EXPERIENCE ANY FEVER, CHILLS, NIGHTSWEATS, NAUSEA OR VOMITING, ELEVATED OR LOW BLOOD SUGARS, REPORT TO EMERGENCY ROOM.  IF YOU EXPERIENCE INCREASED REDNESS, PAIN, SWELLING, DISCOLORATION, ODOR, PUS, DRAINAGE OR WARMTH OF YOUR FOOT, REPORT TO EMERGENCY ROOM.

## 2023-08-09 ENCOUNTER — Encounter: Payer: Self-pay | Admitting: Podiatry

## 2023-08-09 NOTE — Progress Notes (Signed)
Subjective: Patient presents today at risk foot care with history of diabetic neuropathy and preulcerative lesion(s) of both feet. Pain prevent(s) comfortable ambulation. Aggravating factor is weightbearing with and without shoegear. Symptoms relieved with periodic professional debridement.  New concern today: Location: L 2nd toe. Patient states left 2nd toe started to develop blister similar to her 2nd toe. She states she followed previous instructions given by Dr. Burna Mortimer for 2nd toe, but the digit is now red and swollen just beyond the nailplate. She also relates she has not been able to get the ABI study done. She has contacted her insurance company and could not get a definitive answer as to whether the study wound be covered by her insurance or what her financial responsibility would be. She did discuss with her Cardiologist who informed her she has palpable pulses.     Lucky Cowboy, MD is patient's PCP. Last visit was May 30, 2023.  Current Outpatient Medications on File Prior to Visit  Medication Sig Dispense Refill   acetaminophen (TYLENOL) 650 MG CR tablet Take 650 mg by mouth every 8 (eight) hours as needed for pain.     anastrozole (ARIMIDEX) 1 MG tablet Take 1 tablet (1 mg total) by mouth daily. 90 tablet 3   aspirin 81 MG tablet Take 81 mg by mouth daily.     atorvastatin (LIPITOR) 40 MG tablet Take  1 tablet  Daily for Cholesterol                                            /                                                                   TAKE                                         BY                                                 MOUTH 90 tablet 2   Calcium Carbonate-Vit D-Min (CALCIUM 1200 PO) Take by mouth.     cholecalciferol (VITAMIN D3) 25 MCG (1000 UNIT) tablet Take 3,000 Units by mouth daily.     cyclobenzaprine (FLEXERIL) 5 MG tablet Take 1 tablet (5 mg total) by mouth 3 (three) times daily as needed for muscle spasms. 30 tablet 0   diclofenac Sodium  (VOLTAREN) 1 % GEL Apply 4 g topically 4 (four) times daily. (Patient taking differently: Apply 4 g topically 4 (four) times daily as needed (pain).) 4 g 2   dicyclomine (BENTYL) 20 MG tablet Take  1 tablet   3 x /day  as needed  for Nausea, Bloating, Cramping or Diarrhea.                              /  TAKE                                         BY                                                 MOUTH 90 tablet 0   dorzolamide-timolol (COSOPT) 22.3-6.8 MG/ML ophthalmic solution Place 1 drop into both eyes 2 (two) times daily.  1   ezetimibe (ZETIA) 10 MG tablet Take  1 tablet  Daily for Cholesterol                                                                                                /                                                                   TAKE                                         BY                                                 MOUTH 90 tablet 3   ezetimibe (ZETIA) 10 MG tablet Take 1 tablet (10 mg total) by mouth daily, for cholesterol. 90 tablet 0   famotidine (PEPCID) 40 MG tablet Take 1 tablet (40 mg total) by mouth daily. 90 tablet 2   furosemide (LASIX) 40 MG tablet Take 1 tablet (40 mg total) by mouth 1-2 times daily for BP & Fluid Retention 180 tablet 2   levothyroxine (SYNTHROID) 100 MCG tablet Take 1/2 tablet (50 mcg) on Mon-Wed-Fri and take 1 tablet(100 mcg) other days     Multiple Vitamin (MULTIVITAMIN) tablet Take 1 tablet by mouth daily.     ondansetron (ZOFRAN) 4 MG tablet Take 1 tablet (4 mg total) by mouth 3 (three) times daily as needed for nausea and vomiting 90 tablet 1   OVER THE COUNTER MEDICATION Circulation & Vein Support     pantoprazole (PROTONIX) 40 MG tablet Take 1 tablet (40 mg total) by mouth daily to prevent heartburn and indigestion 90 tablet 1   pregabalin (LYRICA) 75 MG capsule Take 1 capsule (75 mg total) by mouth 3 (three) times daily. 270 capsule 1   Probiotic Product  (PROBIOTIC DAILY PO) Take 1 capsule by mouth daily.     No current facility-administered medications on file prior to  visit.     Allergies  Allergen Reactions   Procaine Hcl Shortness Of Breath    Ok to use sq lidocaine for iv starts   Ciprofloxacin     Unknown reaction   Macrobid [Nitrofurantoin Macrocrystal]     Unknown reaction   Mupirocin Dermatitis    Patient states toe turned red after applying medication.   Sulfonamide Derivatives Hives   Tetanus Toxoid Swelling    REACTION: arm swells   Tuberculin Ppd Swelling   Penicillins Rash     Objective: There were no vitals filed for this visit.  [name] is a pleasant 77 y.o. female obese in NAD.Marland Kitchen AAO X 3.  Vascular Examination: Capillary refill time immediate b/l. Vascular status intact b/l with palpable pedal pulses. Pedal hair absent b/l. No pain with calf compression b/l. Skin temperature gradient WNL b/l. +Varicosities b/l LE. +1 pitting edema noted BLE. No ischemia or gangrene noted b/l LE. No cyanosis or clubbing noted b/l LE  Dermatological Examination: No interdigital macerations noted b/l LE. Toenails 1-5 b/l elongated, discolored, dystrophic, thickened, crumbly with subungual debris and tenderness to dorsal palpation.     Wound Location: L 3rd toe There is a moderate amount of devitalized tissue present in the wound. Predebridement Wound Measurement:  1.5  x 0.5 cm with hemorrhagic hyperkeratotic roof Postdebridement Wound Measurement: 1.0 x 1.5 x 0.2 cm. Wound Base: Granular/Healthy Peri-wound: Normal Exudate: Scant/small amount of malodorous seropurulent drainage. Blood Loss during debridement: 0 cc('s). Description of tissue removed from ulceration today:  necrotic tissue. Sign(s) of clinical bacterial infection: erythema, edema, and seropurulent drainage    Wound Location: R 2nd toe There is a minimal amount of devitalized tissue present in the wound. Predebridement Wound Measurement:  0.5 cm  hemorrhagic hyperkeratotic tissue Postdebridement Wound Measurement: 0.3  x 0.2 x 0.1cm. Wound Base: Granular/Healthy Peri-wound: Normal Exudate: None: wound tissue dry Blood Loss during debridement: 0 cc('s). Description of tissue removed from ulceration today:  nonviable hyperkeratosis. Sign(s) of clinical bacterial infection: no clinical signs of infection noted on examination today.  Musculoskeletal Examination: HAV with bunion deformity noted b/l LE. Severe hammertoe deformity noted 2-5 bilaterally.  Neurological Examination: Pt has subjective symptoms of neuropathy. Protective sensation intact 5/5 intact bilaterally with 10g monofilament b/l.  Xray findings left foot: No gas in tissues left foot. Soft tissue swelling present L 3rd toe. Contracted digits 1-5 left foot. No bone erosion noted at location of ulceration L 3rd toe. No evidence of fracture left foot.  Xray findings right foot: No gas in tissues right foot. +Calcification noted achilles tendon. Contracted digits 1-5 right foot. No bone erosion noted at location of ulceration R 3rd toe.  Assessment and Plan 1. Pain due to onychomycosis of toenails of both feet   2. Skin ulcer of third toe of right foot, limited to breakdown of skin (HCC)   3. Skin ulcer of third toe of left foot, limited to breakdown of skin (HCC)   4. Diabetic peripheral neuropathy associated with type 2 diabetes mellitus (HCC)   Skin ulcer of third toe of right foot, limited to breakdown of skin (HCC) - DG Foot Complete Right; Future - DG Foot Complete Left; Future - doxycycline (VIBRAMYCIN) 100 MG capsule; Take 1 capsule (100 mg total) by mouth 2 (two) times daily for 10 days.  Dispense: 20 capsule; Refill: 0 - gentamicin cream (GARAMYCIN) 0.1 %; Apply 1 gram to each toe ulcer once daily.  Dispense: 30 g; Refill: 1  Skin ulcer  of third toe of left foot, limited to breakdown of skin (HCC) - DG Foot Complete Right; Future - DG Foot Complete  Left; Future - doxycycline (VIBRAMYCIN) 100 MG capsule; Take 1 capsule (100 mg total) by mouth 2 (two) times daily for 10 days.  Dispense: 20 capsule; Refill: 0 - gentamicin cream (GARAMYCIN) 0.1 %; Apply 1 gram to each toe ulcer once daily.  Dispense: 30 g; Refill: 1 - Pathologist smear review -Patient was evaluated and treated and all questions answered.  -Patient/POA/Family member educated on diagnosis and treatment plan of routine ulcer debridement/wound care.  -Xrays of left foot and right foot taken in office and reviewed with patient. -Ulceration debridement achieved utilizing sharp excisional debridement to level of subcutaneous tissue with sterile scalpel blade and sterile currette.. Type/amount of devitalized tissue removed: necrotic tissue -Today's ulcer size post-debridement: left 3rd toe 1.0 x 1.5 x 0.2 cm. -Ulceration cleansed with wound cleanser. Betadine ointment applied to base of ulceration and secured with light dressing. -Wound responded well to today's debridement. -Patient risk factors affecting healing of ulcer: diabetic neuropathy, history of foot/leg ulcer, current foot/leg ulcer bilateral 3rd toes, chronic lower extremity edema, HTN, CKD, hyperlipidemia, h/o tobacco use in remission -Victorino December given written instructions on daily wound care for L 3rd toe ulceration. -Prescription written for Gentamicin Cream. Patient is to apply to L 3rd toe and R 3rd toe once daily and cover with dressing.  -Rx for Doxycyline 100 mg, #20, to be taken one twice daily for 10 days. -Frequency of debridements needed to achieve healing: weekly to biweekly. -Wound culture and sensitivity ordered today. -She will follow up in one week with Dr. Carlota Raspberry for ulcerations and in 9 weeks with me for at risk footcare. -Discussed ABI study with patient as we would like to have toe pressures. I have given her the CPT code for the study and she will see if her Cardiologist can perform it in their office.   -Patient/POA to call should there be question/concern in the interim. Return in about 1 week (around 08/15/2023).  Freddie Breech, DPM

## 2023-08-10 NOTE — Addendum Note (Signed)
Addended by: Enedina Finner on: 08/10/2023 05:10 PM   Modules accepted: Orders

## 2023-08-10 NOTE — Addendum Note (Signed)
Addended by: Enedina Finner on: 08/10/2023 05:23 PM   Modules accepted: Orders

## 2023-08-14 ENCOUNTER — Ambulatory Visit: Payer: PPO | Admitting: Podiatry

## 2023-08-14 DIAGNOSIS — M2041 Other hammer toe(s) (acquired), right foot: Secondary | ICD-10-CM

## 2023-08-14 DIAGNOSIS — M2042 Other hammer toe(s) (acquired), left foot: Secondary | ICD-10-CM

## 2023-08-14 DIAGNOSIS — E11621 Type 2 diabetes mellitus with foot ulcer: Secondary | ICD-10-CM

## 2023-08-14 NOTE — Progress Notes (Unsigned)
Chief Complaint  Patient presents with   Foot Ulcer    Bilateral 3rd toe ulcer ' Pt stated that she feels like they are healing    HPI: 77 y.o. female presents today for follow-up infected/ulcerated third toe corns.  She saw Dr. Donzetta Matters who treated her recently and placed her on oral antibiotics.  She states the toes are looking and feeling better.  Past Medical History:  Diagnosis Date   Anemia    Arthritis    Chest tightness    Fatigue    GERD (gastroesophageal reflux disease)    Heart palpitations 12/06/2019   Hyperlipidemia    Hypothyroid    Internal hemorrhoids 05/04/2009   Qualifier: Diagnosis of  By: Nelson-Smith CMA (AAMA), Dottie     Melanoma [facility]) 2013   Excision/surgery only. No other treatment   Myalgia    Neuromuscular disorder (HCC)    Neuropathy   Obesity    Pre-diabetes    Prediabetes    Ulcer    Varicose veins     Past Surgical History:  Procedure Laterality Date   ABDOMINAL HYSTERECTOMY     Total    BREAST LUMPECTOMY WITH RADIOACTIVE SEED LOCALIZATION Right 02/24/2022   Procedure: RIGHT BREAST LUMPECTOMY WITH RADIOACTIVE SEED LOCALIZATION;  Surgeon: Abigail Miyamoto, MD;  Location: [facility] OR;  Service: General;  Laterality: Right;   CATARACT EXTRACTION Bilateral    Dr. Darel Hong   CYST REMOVAL HAND Left 07/30/2018   NASAL SINUS SURGERY     ROTATOR CUFF REPAIR Left more than 7 years ago   Dr. Eulah Pont and Dr. Loreta Ave   TONSILLECTOMY AND ADENOIDECTOMY      Allergies  Allergen Reactions   Procaine Hcl Shortness Of Breath    Ok to use sq lidocaine for iv starts   Ciprofloxacin     Unknown reaction   Macrobid [Nitrofurantoin Macrocrystal]     Unknown reaction   Mupirocin Dermatitis    Patient states toe turned red after applying medication.   Sulfonamide Derivatives Hives   Tetanus Toxoid Swelling    REACTION: arm swells   Tuberculin Ppd Swelling   Penicillins Rash    Physical Exam: Palpable pedal pulses.  Generalized edema to the lower legs  and ankles.  Semiflexible contracture of the third toe at the PIP joint.  This is mildly reducible upon manipulation.  There is hemorrhagic hyperkeratosis to the distal aspect of the third toe bilateral.  There is minimal to no pain on palpation of the area today.  No active drainage is noted.  No open ulceration is seen  Assessment/Plan of Care: 1. Type 2 diabetes mellitus with foot ulcer (CODE) (HCC)   2. Hammertoe of left foot   3. Hammertoe of right foot     Discussed clinical findings with patient today.  Informed the patient that the ulcerations have closed.  The hyperkeratotic tissue was shaved with a sterile #313 blade uneventfully.  Patient will continue with her thyroid toe splints/toe crutch to offload the distal aspect of the toes and decrease pressure to these areas.    Discussed surgical intervention.  She will benefit from percutaneous flexor tenotomy to the bilateral third toe.  Discussed benefits, risks, possible postoperative complications of this procedure.  She was informed this will not result in a perfectly straight toe as if performing a arthroplasty or arthrodesis, but this will allow more of flexibility and motion to the toe so that she is not walking off the semi-rigid tip of the toe.  This can be performed in the office under local anesthesia.  I discussed the procedure in detail including a 1 stitch closure as well as daily Band-Aid changes for 1 week and follow-up to have the suture removed in 1 week.  As she will have a light dressing the day of the procedure.  There will be no need to stop any blood thinners or other medications preoperatively for this flexor tenotomy  Follow-up with surgery scheduling to be performed in the office for a 7:30 AM start time in the procedural room   Lux Meaders D. Jermale Crass, DPM, FACFAS [facility]     [address] [place], [place] [address]                Office [phone]  Fax [phone]

## 2023-08-21 ENCOUNTER — Telehealth: Payer: Self-pay | Admitting: Podiatry

## 2023-08-21 NOTE — Telephone Encounter (Signed)
Patient was scheduled for a tenotomy and wants to change to another day. I explained to pt I dont work in [employer] full time so I wasn't familiar with the procedure schedule. Please call pt back to reschedule for another day. [phone]

## 2023-08-23 NOTE — Telephone Encounter (Signed)
Called pt to schedule her an appt per [facility] she can do a 7:30am in procedure room in [place]. Called pt to schedule her she states she talked to [facility] yesterday and they needed to see how much ins was going to cover. I explained to pt that I work in [employer] and was covering in [place] but wanted to make sure she rescheduled. Patient states she will call [place] to reschedule appt. I told her if she had any issues to please call me in [place] office.

## 2023-08-23 NOTE — Telephone Encounter (Signed)
[name] was calling the ins company for her.

## 2023-08-29 ENCOUNTER — Telehealth: Payer: Self-pay | Admitting: Podiatry

## 2023-08-29 NOTE — Telephone Encounter (Signed)
DOS 09/11/2023  TENOTOMY SINGLE BILAT [id number]  [employer] EFFECTIVE DATE- 11/21/2022  SPOKE WITH BHARAT B. FROM [employer] AND HE STATED THAT NO PRIOR AUTH IS REQUIRED FOR CPT CODE 40981. HE ALSO STATED THAT FOR AN OFFICE PROCEDURE, THE PT'S COPAY WILL BE $20.00.  CALL REFERENCE NUMBER: [id number]

## 2023-09-01 NOTE — Progress Notes (Signed)
FOLLOW UP   Assessment and Plan:   Malignant Neoplasm of upper outer quadrant right female breast Continue anastrozole and follow with oncology  Type 2 diabetes mellitus with complication, with stage 3 a CKD (HCC) Discussed general issues about diabetes pathophysiology and management., Educational material distributed., Suggested low cholesterol diet., Encouraged aerobic exercise., Discussed foot care., Reminded to get yearly retinal exam. -     Hemoglobin A1c ([facility])  Hyperlipidemia associated with T2DM (HCC)/ Type 2 DM with Hyperlipidemia(HCC) check lipids, statin/zetia for LDL goal <70 decrease fatty foods increase activity.  -    Lipid panel   CKD stage 3 secondary to diabetes (HCC) Increase fluids, avoid NSAIDS, monitor sugars, will monitor -    CMP/GFR, CBC   Atherosclerosis of aorta (HCC) Per CT 03/23/17 Control blood pressure, cholesterol, glucose, increase exercise.   CAD Cardiology following; defer EKG to them Control blood pressure, cholesterol, glucose, increase exercise.  Go to the ER if any chest pain, shortness of breath, nausea, dizziness.  Type 2 Diabetes Mellitus with Morbid obesity (HCC) - follow up 3 months for progress monitoring - increase veggies, decrease carbs - long discussion about weight loss, diet, and exercise  Labile hypertension - continue medications, DASH diet, exercise and monitor at home. Call if greater than 130/80.  -     CBC with Diff -     COMPLETE METABOLIC PANEL WITH GFR   Hypothyroidism, unspecified type -     TSH Hypothyroidism-check TSH level, continue medications the same, reminded to take on an empty stomach 30-62mins before food.   Medication management Continued  Vitamin D deficiency Continue Vit D supplementation to maintain value in therapeutic level of 60-100   History of tobacco use -23 pack year/Pulmonary emphysema(HCC) Monitor, had benign CT 06/29/22- benign repeat in 1 year CT lung cancer screening ordered  today  Flu Vaccine need High dose flu vaccine given     Continue diet and meds as discussed. Further disposition pending results of labs. Over 30 minutes of face to face interview, exam, counseling, chart review, and critical decision making was performed  Future Appointments  Date Time Provider Department Center  09/11/2023  7:30 AM McCaughan, [place] D, DPM [facility]  10/10/2023  3:15 PM Freddie Breech, DPM [facility]  11/09/2023  2:00 PM Raynelle Dick, NP [facility] None  12/12/2023  2:15 PM Freddie Breech, DPM [facility]  02/08/2024  2:30 PM Raynelle Dick, NP [facility] None  02/13/2024  1:45 PM Freddie Breech, DPM [facility]  04/01/2024  2:45 PM Serena Croissant, MD [facility] None  05/30/2024  2:30 PM Lucky Cowboy, MD [facility] None    HPI 77 y.o. female  presents for Medicare annual wellness and 3 month follow up. She has Diverticulosis of large intestine; History of tobacco use; Hypothyroidism; Controlled type 2 diabetes mellitus with complication, without long-term current use of insulin (HCC); Morbid obesity (HCC); Chronic venous insufficiency; Peripheral autonomic neuropathy of unknown cause; Labile hypertension; History of basal cell carcinoma (BCC); Osteoarthritis of knee; Atherosclerosis of aorta (HCC) by CT scan in 2019; Vitamin D deficiency; Medication management; Gastroesophageal reflux disease with esophagitis; Type 2 diabetes mellitus with foot ulcer (CODE) (HCC); CKD stage 3 secondary to diabetes (HCC); Family history of heart disease; CAD (coronary artery disease); Malignant neoplasm of upper-outer quadrant of right breast in female, estrogen receptor positive (HCC); Skin ulcer of third toe of right foot, limited to breakdown of skin (HCC); and Hammertoe of right foot on their problem list.  She is widowed, 3 grandchildren, 1 great grandson - 17 months.  She is retired from [employer] -   She had lumpectomy  02/24/22 for right  breast Invasive moderately differentiated  Ductal Carcinoma Intermediate.  She underwent a recent lumpectomy which showed grade 2 IDC 8 mm with DCIS margins negative.  She presents to the clinic today for a follow-up to discuss adjuvant treatment plan. She is on Anastrozole for 5 years. Follow with Dr. Pamelia Hoit.   She is former smoker - 23.5 pack year hisotry, quit 2015, strong family hx of llung cancer, she had CT chest for coronary in 06/29/22 1. Lung-RADS 2S, benign appearance or behavior. Continue annual screening with low-dose chest CT without contrast in 12 months.Marland Kitchen   BMI is Body mass index is 35.22 kg/m., she is working on diet and exercise, trying to watch what she eats and smaller portions. Down from 246 lb in 07/2018. She is doing the legerciser, she has a cubi Jr also [employer] from Last 3 Encounters:  09/04/23 218 lb 3.2 oz (99 kg)  07/10/23 217 lb 6.4 oz (98.6 kg)  05/30/23 216 lb 9.6 oz (98.2 kg)   She is following with Dr. Sherlean Foot for CAD; in 01/2020 she had cardiac CT 12/27/19  showing CCS of 693, 78th % for age/gender, Calcified plaque causing moderate stenosis in the prox LAD, distal RCA, proximal LCx, moderate stenosis, and also aortic atherosclerosis   Her blood pressure has been controlled at home, has been good, today their BP is BP: 132/60  BP Readings from Last 3 Encounters:  09/04/23 132/60  07/10/23 116/62  06/06/23 (!) 120/46  She does workout. She denies chest pain, shortness of breath, dizziness.  She has been working on diet and exercise for Diabetes, doing lifestyle changes  with diabetic chronic kidney disease she is on ACE/ARB With hyperlipidemia IS at goal less than 70, she taking atorvastatin 40 mg and ezetimibe 10 mg  she is on bASA. She is taking cinnamon for diabetes Has idiopathic paraesthsias and is on lyrica and does help pain,  follows with Dr. Donzetta Matters every 90 days, no current lesions on feet Eye exam Dr. Lorin Picket - saw  recently, report requested  denies polydipsia, polyuria and visual disturbances.   Last A1C was:  Lab Results  Component Value Date   HGBA1C 6.2 (H) 05/30/2023   She is trying to drink more water Lab Results  Component Value Date   EGFR 53 (L) 05/30/2023   Lab Results  Component Value Date   CHOL 125 05/30/2023   HDL 45 (L) 05/30/2023   LDLCALC 55 05/30/2023   TRIG 174 (H) 05/30/2023   CHOLHDL 2.8 05/30/2023   Patient is on Vitamin D supplement.   Lab Results  Component Value Date   VD25OH 93 05/30/2023     She is on thyroid medication. Her medication was changed last visit to Levothyroxine 100 mcg daily except M-Wed-Fri takes 1/2 tab Lab Results  Component Value Date   TSH 4.59 (H) 05/30/2023  .     Current Medications:   Current Outpatient Medications (Endocrine & Metabolic):    levothyroxine (SYNTHROID) 100 MCG tablet, Take 1/2 tablet (50 mcg) on Mon-Wed-Fri and take 1 tablet(100 mcg) other days  Current Outpatient Medications (Cardiovascular):    atorvastatin (LIPITOR) 40 MG tablet, Take  1 tablet  Daily for Cholesterol                                            /  TAKE                                         BY                                                 MOUTH   ezetimibe (ZETIA) 10 MG tablet, Take 1 tablet (10 mg total) by mouth daily, for cholesterol.   furosemide (LASIX) 40 MG tablet, Take 1 tablet (40 mg total) by mouth 1-2 times daily for BP & Fluid Retention   ezetimibe (ZETIA) 10 MG tablet, Take  1 tablet  Daily for Cholesterol                                                                                                /                                                                   TAKE                                         BY                                                 MOUTH   Current Outpatient Medications (Analgesics):    acetaminophen (TYLENOL) 650 MG CR tablet, Take 650 mg  by mouth every 8 (eight) hours as needed for pain.   aspirin 81 MG tablet, Take 81 mg by mouth daily.   Current Outpatient Medications (Other):    anastrozole (ARIMIDEX) 1 MG tablet, Take 1 tablet (1 mg total) by mouth daily.   Calcium Carbonate-Vit D-Min (CALCIUM 1200 PO), Take by mouth.   cholecalciferol (VITAMIN D3) 25 MCG (1000 UNIT) tablet, Take 3,000 Units by mouth daily.   cyclobenzaprine (FLEXERIL) 5 MG tablet, Take 1 tablet (5 mg total) by mouth 3 (three) times daily as needed for muscle spasms.   diclofenac Sodium (VOLTAREN) 1 % GEL, Apply 4 g topically 4 (four) times daily. (Patient taking differently: Apply 4 g topically 4 (four) times daily as needed (pain).)   dicyclomine (BENTYL) 20 MG tablet, Take  1 tablet   3 x /day  as needed  for Nausea, Bloating, Cramping or Diarrhea.                              /  TAKE                                         BY                                                 MOUTH   dorzolamide-timolol (COSOPT) 22.3-6.8 MG/ML ophthalmic solution, Place 1 drop into both eyes 2 (two) times daily.   famotidine (PEPCID) 40 MG tablet, Take 1 tablet (40 mg total) by mouth daily.   Multiple Vitamin (MULTIVITAMIN) tablet, Take 1 tablet by mouth daily.   pantoprazole (PROTONIX) 40 MG tablet, Take 1 tablet (40 mg total) by mouth daily to prevent heartburn and indigestion   pregabalin (LYRICA) 75 MG capsule, Take 1 capsule (75 mg total) by mouth 3 (three) times daily.   Probiotic Product (PROBIOTIC DAILY PO), Take 1 capsule by mouth daily.   gentamicin cream (GARAMYCIN) 0.1 %, Apply 1 gram to each toe ulcer once daily. (Patient not taking: Reported on 09/04/2023)   ondansetron (ZOFRAN) 4 MG tablet, Take 1 tablet (4 mg total) by mouth 3 (three) times daily as needed for nausea and vomiting   OVER THE COUNTER MEDICATION, Circulation & Vein Support  Medical History:  Past Medical History:  Diagnosis Date    Anemia    Arthritis    Chest tightness    Fatigue    GERD (gastroesophageal reflux disease)    Heart palpitations 12/06/2019   Hyperlipidemia    Hypothyroid    Internal hemorrhoids 05/04/2009   Qualifier: Diagnosis of  By: Nelson-Smith CMA (AAMA), Dottie     Melanoma [facility]) 2013   Excision/surgery only. No other treatment   Myalgia    Neuromuscular disorder (HCC)    Neuropathy   Obesity    Pre-diabetes    Prediabetes    Ulcer    Varicose veins    Health Maintenance:   Immunization History  Administered Date(s) Administered   Influenza, High Dose Seasonal PF 08/19/2014, 09/10/2015, 07/26/2016, 08/10/2017, 08/14/2018, 08/27/2019, 09/10/2020, 10/12/2021, 08/09/2022, 09/04/2023   Pneumococcal Conjugate-13 05/12/2014   Pneumococcal Polysaccharide-23 09/05/2007, 06/02/2015   Health Maintenance  Topic Date Due   COVID-19 Vaccine (1) Never done   Zoster Vaccines- Shingrix (1 of 2) Never done   Lung Cancer Screening  06/30/2023   Diabetic kidney evaluation - Urine ACR  11/09/2023   HEMOGLOBIN A1C  11/30/2023   MAMMOGRAM  01/17/2024   OPHTHALMOLOGY EXAM  01/21/2024   Medicare Annual Wellness (AWV)  02/13/2024   FOOT EXAM  04/03/2024   Diabetic kidney evaluation - eGFR measurement  05/29/2024   Pneumonia Vaccine 21+ Years old  Completed   INFLUENZA VACCINE  Completed   DEXA SCAN  Completed   Hepatitis C Screening  Completed   HPV VACCINES  Aged Out   DTaP/Tdap/Td  Discontinued   Colonoscopy  Discontinued     Derm follows skin surgery center, hx of melanoma to [facility], goes annually, last 2021 Dr. Lorin Picket for eye care, checking for glaucoma, 07/2022 Dr. Michaell Cowing Dentist  Patient Care Team: Lucky Cowboy, MD as PCP - General (Internal Medicine) Jodelle Red, MD as PCP - Cardiology (Cardiology) Fredrich Birks, OD as Referring Physician (Optometry) Javier Glazier as Referring Physician (Physician Assistant) Abigail Miyamoto, MD as Consulting  Physician  (General Surgery) Serena Croissant, MD as Consulting Physician (Hematology and Oncology) Lonie Peak, MD as Attending Physician (Radiation Oncology) Sundra Aland, [facility] (Inactive) as Pharmacist (Pharmacist)  Allergies Allergies  Allergen Reactions   Procaine Hcl Shortness Of Breath    Ok to use sq lidocaine for iv starts   Ciprofloxacin     Unknown reaction   Macrobid [Nitrofurantoin Macrocrystal]     Unknown reaction   Mupirocin Dermatitis    Patient states toe turned red after applying medication.   Sulfonamide Derivatives Hives   Tetanus Toxoid Swelling    REACTION: arm swells   Tuberculin Ppd Swelling   Penicillins Rash    SURGICAL HISTORY She  has a past surgical history that includes Abdominal hysterectomy; Tonsillectomy and adenoidectomy; Cataract extraction (Bilateral); Nasal sinus surgery; Rotator cuff repair (Left, more than 7 years ago); Cyst removal hand (Left, 07/30/2018); and Breast lumpectomy with radioactive seed localization (Right, 02/24/2022). FAMILY HISTORY Her family history includes Breast cancer in her cousin; Coronary artery disease (age of onset: 84) in her father; Dementia in her maternal aunt and mother; Lung cancer in her maternal grandmother and mother. SOCIAL HISTORY She  reports that she quit smoking about 10 years ago. Her smoking use included cigarettes. She started smoking about 57 years ago. She has a 25 pack-year smoking history. She has never used smokeless tobacco. She reports that she does not currently use alcohol. She reports that she does not use drugs.   Review of Systems:  Review of Systems  Constitutional:  Negative for malaise/fatigue and weight loss.  HENT:  Positive for tinnitus. Negative for hearing loss.   Eyes:  Negative for blurred vision and double vision.  Respiratory:  Negative for cough, sputum production, shortness of breath and wheezing.   Cardiovascular:  Negative for chest pain, palpitations, orthopnea, claudication, leg  swelling and PND.  Gastrointestinal:  Negative for abdominal pain, blood in stool, constipation, diarrhea, heartburn, melena, nausea and vomiting.  Genitourinary: Negative.   Musculoskeletal:  Positive for joint pain (knees). Negative for falls and myalgias.  Skin:  Negative for rash.  Neurological:  Positive for tingling (bil feet). Negative for dizziness, sensory change, weakness and headaches.  Endo/Heme/Allergies:  Negative for polydipsia.  Psychiatric/Behavioral: Negative.  Negative for depression, memory loss, substance abuse and suicidal ideas. The patient is not nervous/anxious and does not have insomnia.   All other systems reviewed and are negative.   Physical Exam: BP 132/60   Pulse 78   Temp 97.9 F (36.6 C)   Ht 5\' 6"  (1.676 m)   Wt 218 lb 3.2 oz (99 kg)   SpO2 96%   BMI 35.22 kg/m  Wt Readings from Last 3 Encounters:  09/04/23 218 lb 3.2 oz (99 kg)  07/10/23 217 lb 6.4 oz (98.6 kg)  05/30/23 216 lb 9.6 oz (98.2 kg)   General Appearance: Well nourished, in no apparent distress. Eyes: PERRLA, EOMs, conjunctiva no swelling or erythema Sinuses: No Frontal/maxillary tenderness ENT/Mouth: Ext aud canals clear, TMs without erythema, bulging. No erythema, swelling, or exudate on post pharynx.  Tonsils not swollen or erythematous. Hearing normal.  Neck: Supple, thyroid normal.  Respiratory: Respiratory effort normal, BS equal bilaterally without rales, rhonchi, wheezing or stridor.  Cardio: RRR with no MRGs. Brisk peripheral pulses with mild edema bil lower legs  Abdomen: Soft, + BS,  Non tender, no guarding, rebound, hernias, masses. Lymphatics: Non tender without lymphadenopathy.  Musculoskeletal: Full ROM, 5/5 strength, normal steady gait Skin:  Warm, dry  without rashes, lesions, ecchymosis.  Neuro: Cranial nerves intact. Normal muscle tone, no cerebellar symptoms.Bil feet with diminished sensation throughout to monofilament Psych: Awake and oriented X 3, normal affect,  Insight and Judgment appropriate.      Raynelle Dick, NP 3:30PM [facility]

## 2023-09-04 ENCOUNTER — Ambulatory Visit (INDEPENDENT_AMBULATORY_CARE_PROVIDER_SITE_OTHER): Payer: PPO | Admitting: Nurse Practitioner

## 2023-09-04 ENCOUNTER — Encounter: Payer: Self-pay | Admitting: Nurse Practitioner

## 2023-09-04 ENCOUNTER — Other Ambulatory Visit (HOSPITAL_COMMUNITY): Payer: Self-pay

## 2023-09-04 VITALS — BP 132/60 | HR 78 | Temp 97.9°F | Ht 66.0 in | Wt 218.2 lb

## 2023-09-04 DIAGNOSIS — E039 Hypothyroidism, unspecified: Secondary | ICD-10-CM

## 2023-09-04 DIAGNOSIS — C50411 Malignant neoplasm of upper-outer quadrant of right female breast: Secondary | ICD-10-CM | POA: Diagnosis not present

## 2023-09-04 DIAGNOSIS — E1122 Type 2 diabetes mellitus with diabetic chronic kidney disease: Secondary | ICD-10-CM | POA: Diagnosis not present

## 2023-09-04 DIAGNOSIS — E559 Vitamin D deficiency, unspecified: Secondary | ICD-10-CM

## 2023-09-04 DIAGNOSIS — E1169 Type 2 diabetes mellitus with other specified complication: Secondary | ICD-10-CM | POA: Diagnosis not present

## 2023-09-04 DIAGNOSIS — I2510 Atherosclerotic heart disease of native coronary artery without angina pectoris: Secondary | ICD-10-CM

## 2023-09-04 DIAGNOSIS — J439 Emphysema, unspecified: Secondary | ICD-10-CM

## 2023-09-04 DIAGNOSIS — I10 Essential (primary) hypertension: Secondary | ICD-10-CM

## 2023-09-04 DIAGNOSIS — E785 Hyperlipidemia, unspecified: Secondary | ICD-10-CM | POA: Diagnosis not present

## 2023-09-04 DIAGNOSIS — Z87891 Personal history of nicotine dependence: Secondary | ICD-10-CM

## 2023-09-04 DIAGNOSIS — N1831 Chronic kidney disease, stage 3a: Secondary | ICD-10-CM | POA: Diagnosis not present

## 2023-09-04 DIAGNOSIS — Z79899 Other long term (current) drug therapy: Secondary | ICD-10-CM | POA: Diagnosis not present

## 2023-09-04 DIAGNOSIS — N183 Chronic kidney disease, stage 3 unspecified: Secondary | ICD-10-CM | POA: Diagnosis not present

## 2023-09-04 DIAGNOSIS — Z23 Encounter for immunization: Secondary | ICD-10-CM

## 2023-09-04 DIAGNOSIS — I700 Atherosclerosis of aorta: Secondary | ICD-10-CM

## 2023-09-04 NOTE — Patient Instructions (Signed)

## 2023-09-05 ENCOUNTER — Ambulatory Visit: Payer: PPO | Admitting: Podiatry

## 2023-09-05 ENCOUNTER — Other Ambulatory Visit (HOSPITAL_COMMUNITY): Payer: Self-pay

## 2023-09-05 LAB — CBC WITH DIFFERENTIAL/PLATELET
Absolute Monocytes: 836 {cells}/uL (ref 200–950)
Basophils Absolute: 44 {cells}/uL (ref 0–200)
Basophils Relative: 0.4 %
Eosinophils Absolute: 165 {cells}/uL (ref 15–500)
Eosinophils Relative: 1.5 %
HCT: 41.6 % (ref 35.0–45.0)
Hemoglobin: 13.5 g/dL (ref 11.7–15.5)
Lymphs Abs: 2948 {cells}/uL (ref 850–3900)
MCH: 28.4 pg (ref 27.0–33.0)
MCHC: 32.5 g/dL (ref 32.0–36.0)
MCV: 87.6 fL (ref 80.0–100.0)
MPV: 11.4 fL (ref 7.5–12.5)
Monocytes Relative: 7.6 %
Neutro Abs: 7007 {cells}/uL (ref 1500–7800)
Neutrophils Relative %: 63.7 %
Platelets: 262 10*3/uL (ref 140–400)
RBC: 4.75 10*6/uL (ref 3.80–5.10)
RDW: 13.4 % (ref 11.0–15.0)
Total Lymphocyte: 26.8 %
WBC: 11 10*3/uL — ABNORMAL HIGH (ref 3.8–10.8)

## 2023-09-05 LAB — COMPLETE METABOLIC PANEL WITH GFR
AG Ratio: 1.5 (calc) (ref 1.0–2.5)
ALT: 18 U/L (ref 6–29)
AST: 16 U/L (ref 10–35)
Albumin: 3.9 g/dL (ref 3.6–5.1)
Alkaline phosphatase (APISO): 107 U/L (ref 37–153)
BUN: 19 mg/dL (ref 7–25)
CO2: 26 mmol/L (ref 20–32)
Calcium: 9.3 mg/dL (ref 8.6–10.4)
Chloride: 108 mmol/L (ref 98–110)
Creat: 1 mg/dL (ref 0.60–1.00)
Globulin: 2.6 g/dL (ref 1.9–3.7)
Glucose, Bld: 99 mg/dL (ref 65–99)
Potassium: 4.2 mmol/L (ref 3.5–5.3)
Sodium: 145 mmol/L (ref 135–146)
Total Bilirubin: 0.6 mg/dL (ref 0.2–1.2)
Total Protein: 6.5 g/dL (ref 6.1–8.1)
eGFR: 59 mL/min/{1.73_m2} — ABNORMAL LOW (ref >=60)

## 2023-09-05 LAB — LIPID PANEL
Cholesterol: 122 mg/dL (ref <200)
HDL: 45 mg/dL — ABNORMAL LOW (ref >=50)
LDL Cholesterol (Calc): 47 mg/dL
Non-HDL Cholesterol (Calc): 77 mg/dL (ref <130)
Total CHOL/HDL Ratio: 2.7 (calc) (ref <5.0)
Triglycerides: 237 mg/dL — ABNORMAL HIGH (ref <150)

## 2023-09-05 LAB — TSH: TSH: 2.05 m[IU]/L (ref 0.40–4.50)

## 2023-09-05 LAB — HEMOGLOBIN A1C W/OUT EAG: Hgb A1c MFr Bld: 6.4 %{Hb} — ABNORMAL HIGH (ref <5.7)

## 2023-09-08 ENCOUNTER — Other Ambulatory Visit: Payer: Self-pay

## 2023-09-08 ENCOUNTER — Other Ambulatory Visit (HOSPITAL_COMMUNITY): Payer: Self-pay

## 2023-09-08 MED ORDER — ATORVASTATIN CALCIUM 40 MG PO TABS
40.0000 mg | ORAL_TABLET | Freq: Every day | ORAL | 2 refills | Status: DC
  Filled 2023-09-08: qty 90, 90d supply, fill #0
  Filled 2023-12-08: qty 90, 90d supply, fill #1

## 2023-09-08 MED ORDER — LEVOTHYROXINE SODIUM 100 MCG PO TABS
100.0000 ug | ORAL_TABLET | Freq: Every day | ORAL | 3 refills | Status: DC
  Filled 2023-09-08: qty 90, 90d supply, fill #0

## 2023-09-11 ENCOUNTER — Ambulatory Visit: Payer: PPO | Admitting: Podiatry

## 2023-09-11 DIAGNOSIS — M2042 Other hammer toe(s) (acquired), left foot: Secondary | ICD-10-CM | POA: Diagnosis not present

## 2023-09-11 DIAGNOSIS — M2041 Other hammer toe(s) (acquired), right foot: Secondary | ICD-10-CM

## 2023-09-11 NOTE — Progress Notes (Signed)
HPI: 77 y.o. female presents today for bilateral third toe percutaneous flexor tenotomy.  We had discussed this at a previous visit.  She notes that there has been no openings or drainage from the hammertoes since her last visit.  She is ready to proceed with the procedure.  The consent form was reviewed and signed by the patient today.  Past Medical History:  Diagnosis Date   Anemia    Arthritis    Chest tightness    Fatigue    GERD (gastroesophageal reflux disease)    Heart palpitations 12/06/2019   Hyperlipidemia    Hypothyroid    Internal hemorrhoids 05/04/2009   Qualifier: Diagnosis of  By: Nelson-Smith CMA (AAMA), Dottie     Melanoma [facility]) 2013   Excision/surgery only. No other treatment   Myalgia    Neuromuscular disorder (HCC)    Neuropathy   Obesity    Pre-diabetes    Prediabetes    Ulcer    Varicose veins     Past Surgical History:  Procedure Laterality Date   ABDOMINAL HYSTERECTOMY     Total    BREAST LUMPECTOMY WITH RADIOACTIVE SEED LOCALIZATION Right 02/24/2022   Procedure: RIGHT BREAST LUMPECTOMY WITH RADIOACTIVE SEED LOCALIZATION;  Surgeon: Abigail Miyamoto, MD;  Location: [facility] OR;  Service: General;  Laterality: Right;   CATARACT EXTRACTION Bilateral    Dr. Darel Hong   CYST REMOVAL HAND Left 07/30/2018   NASAL SINUS SURGERY     ROTATOR CUFF REPAIR Left more than 7 years ago   Dr. Eulah Pont and Dr. Loreta Ave   TONSILLECTOMY AND ADENOIDECTOMY      Allergies  Allergen Reactions   Procaine Hcl Shortness Of Breath    Ok to use sq lidocaine for iv starts   Ciprofloxacin     Unknown reaction   Macrobid [Nitrofurantoin Macrocrystal]     Unknown reaction   Mupirocin Dermatitis    Patient states toe turned red after applying medication.   Sulfonamide Derivatives Hives   Tetanus Toxoid Swelling    REACTION: arm swells   Tuberculin Ppd Swelling   Penicillins Rash    Physical Exam: There are palpable pedal pulses bilateral.  The hammertoes semiflexible to the  third toe bilateral.  There are recurring painful corns that tend to ulcerate at the tips of the third toes.  No clinical signs of infection are noted today.  Epicritic sensation is intact Joint spaces preserved.  No fractures or osseous irregularities noted.  Assessment/Plan of Care: 1. Hammertoe of left foot   2. Hammertoe of right foot    Discussed benefits, risks, and possible postoperative complications from having the hammertoes corrected using a percutaneous flexor tenotomy.  Patient is well aware that this may not cause complete straightening of the toe but should help allow her to straighten enough to decrease her chance of getting these ulcerated corns repeatedly.  All questions were answered.  The patient was reclined in an exam chair today.  The bilateral feet were prepped and draped in a sterile manner.  The bilateral third toes were anesthetized utilizing a 50-50 mixture of 1% lidocaine plain and 0.5% Sensorcaine plain for a total of 3 cc administered to each toe.  A sterile skin prep was then administered to the forefoot.  Upon confirmation of anesthesia, a sterile #15 blade was utilized to perform a skin puncture on the plantar aspect of the right third toe at the PIP joint level.  The blade was then passed deep to the flexor  tendon and then the blade was rotated superficially while straightening the toe so as to allow for tenotomy of the flexor tendon.  Once this was achieved immediate improvement of the toe position was noted.  The area was rinsed with normal saline.  The stab incision was then closed with one 4-0 nylon suture.  The same procedure was then performed on the left third toe.  Immediate capillary refill was noted to the toes.  It should be noted that no tourniquet was utilized for the procedures.  Betadine gauze followed by light, and dry sterile dressing were applied to the third toes and portion of the forefoot and secured with tape.  She was instructed to go home and  elevate feet for the rest of today.  Beginning tomorrow she can remove the bandages and just replace with a Band-Aid each day.    She will follow-up in 1 week for suture removal.    Maleta Pacha D. Elen Acero, DPM, FACFAS [facility]     [address] [place], [place] [address]                Office [phone]  Fax [phone]

## 2023-09-13 ENCOUNTER — Other Ambulatory Visit: Payer: Self-pay | Admitting: Nurse Practitioner

## 2023-09-13 ENCOUNTER — Other Ambulatory Visit (HOSPITAL_COMMUNITY): Payer: Self-pay

## 2023-09-13 DIAGNOSIS — E1140 Type 2 diabetes mellitus with diabetic neuropathy, unspecified: Secondary | ICD-10-CM

## 2023-09-14 ENCOUNTER — Other Ambulatory Visit: Payer: Self-pay

## 2023-09-14 MED ORDER — PREGABALIN 75 MG PO CAPS
75.0000 mg | ORAL_CAPSULE | Freq: Three times a day (TID) | ORAL | 0 refills | Status: DC
  Filled 2023-09-14: qty 270, 90d supply, fill #0

## 2023-09-18 ENCOUNTER — Ambulatory Visit (INDEPENDENT_AMBULATORY_CARE_PROVIDER_SITE_OTHER): Payer: PPO | Admitting: Podiatry

## 2023-09-18 DIAGNOSIS — M2041 Other hammer toe(s) (acquired), right foot: Secondary | ICD-10-CM

## 2023-09-18 DIAGNOSIS — M2042 Other hammer toe(s) (acquired), left foot: Secondary | ICD-10-CM

## 2023-09-18 DIAGNOSIS — Z9889 Other specified postprocedural states: Secondary | ICD-10-CM

## 2023-09-18 NOTE — Progress Notes (Signed)
Chief Complaint  Patient presents with   Routine Post Op    DENIES N/V/F/C/SOB, NO PAIN    HPI: 77 y.o. female presents today for 1 week postop following percutaneous flexor tenotomy to bilateral third toes.  Not experiencing any pain.  This has been using a Band-Aid on the toe daily.  Denies any recent drainage.  Past Medical History:  Diagnosis Date   Anemia    Arthritis    Chest tightness    Fatigue    GERD (gastroesophageal reflux disease)    Heart palpitations 12/06/2019   Hyperlipidemia    Hypothyroid    Internal hemorrhoids 05/04/2009   Qualifier: Diagnosis of  By: Nelson-Smith CMA (AAMA), Dottie     Melanoma [facility]) 2013   Excision/surgery only. No other treatment   Myalgia    Neuromuscular disorder (HCC)    Neuropathy   Obesity    Pre-diabetes    Prediabetes    Ulcer    Varicose veins     Past Surgical History:  Procedure Laterality Date   ABDOMINAL HYSTERECTOMY     Total    BREAST LUMPECTOMY WITH RADIOACTIVE SEED LOCALIZATION Right 02/24/2022   Procedure: RIGHT BREAST LUMPECTOMY WITH RADIOACTIVE SEED LOCALIZATION;  Surgeon: Abigail Miyamoto, MD;  Location: [facility] OR;  Service: General;  Laterality: Right;   CATARACT EXTRACTION Bilateral    Dr. Darel Hong   CYST REMOVAL HAND Left 07/30/2018   NASAL SINUS SURGERY     ROTATOR CUFF REPAIR Left more than 7 years ago   Dr. Eulah Pont and Dr. Loreta Ave   TONSILLECTOMY AND ADENOIDECTOMY      Allergies  Allergen Reactions   Procaine Hcl Shortness Of Breath    Ok to use sq lidocaine for iv starts   Ciprofloxacin     Unknown reaction   Macrobid [Nitrofurantoin Macrocrystal]     Unknown reaction   Mupirocin Dermatitis    Patient states toe turned red after applying medication.   Sulfonamide Derivatives Hives   Tetanus Toxoid Swelling    REACTION: arm swells   Tuberculin Ppd Swelling   Penicillins Rash    Physical Exam: On exam there is no significant edema to the third toes.  No signs of infection.  The sutures  are holding well.  The incisions are coapted.  The distal corns are not any worse than they were at her surgical visit.  It does appear that the toes have relaxed in the position and generally not as rigid.  Assessment/Plan of Care: 1. Hammertoe of left foot   2. Hammertoe of right foot   3. Post-operative state     Discussed clinical findings with patient today.  Patient sutures were removed uneventfully today.  Steri-Strips were applied across the incision for additional support.  The corns at the distal aspect of toe were shaved today.  Patient will leave the Steri-Strips on until they fall off on their own.  After today no further care is needed to the area unless she notices any drainage then she can call for instructions.  Follow-up in 2 weeks for final postop recheck.   Clerance Lav, DPM, FACFAS [facility]     [address], [place] [address]  Office [phone]  Fax [phone]

## 2023-09-27 ENCOUNTER — Other Ambulatory Visit (HOSPITAL_COMMUNITY): Payer: Self-pay

## 2023-09-28 ENCOUNTER — Other Ambulatory Visit (HOSPITAL_COMMUNITY): Payer: Self-pay

## 2023-09-28 DIAGNOSIS — H40013 Open angle with borderline findings, low risk, bilateral: Secondary | ICD-10-CM | POA: Diagnosis not present

## 2023-10-02 ENCOUNTER — Ambulatory Visit: Payer: PPO | Admitting: Podiatry

## 2023-10-02 DIAGNOSIS — Z9889 Other specified postprocedural states: Secondary | ICD-10-CM

## 2023-10-02 NOTE — Progress Notes (Unsigned)
      Chief Complaint  Patient presents with   Foot Pain    POV#2 follow up on both feet third toe patient states doing well at this time.   HPI: 77 y.o. female presents today for postop visit #2 after having a percutaneous flexor tenotomy performed to the bilateral third toe.  She is not having any pain at this time.  Notes that her corns on the distal aspect of the third toes have not gotten any worse.  She does feel like the toes sit straighter since the procedure was performed and is very pleased.  Past Medical History:  Diagnosis Date   Anemia    Arthritis    Chest tightness    Fatigue    GERD (gastroesophageal reflux disease)    Heart palpitations 12/06/2019   Hyperlipidemia    Hypothyroid    Internal hemorrhoids 05/04/2009   Qualifier: Diagnosis of  By: Nelson-Smith CMA (AAMA), Dottie     Melanoma [facility]) 2013   Excision/surgery only. No other treatment   Myalgia    Neuromuscular disorder (HCC)    Neuropathy   Obesity    Pre-diabetes    Prediabetes    Ulcer    Varicose veins     Past Surgical History:  Procedure Laterality Date   ABDOMINAL HYSTERECTOMY     Total    BREAST LUMPECTOMY WITH RADIOACTIVE SEED LOCALIZATION Right 02/24/2022   Procedure: RIGHT BREAST LUMPECTOMY WITH RADIOACTIVE SEED LOCALIZATION;  Surgeon: Abigail Miyamoto, MD;  Location: [facility] OR;  Service: General;  Laterality: Right;   CATARACT EXTRACTION Bilateral    Dr. Darel Hong   CYST REMOVAL HAND Left 07/30/2018   NASAL SINUS SURGERY     ROTATOR CUFF REPAIR Left more than 7 years ago   Dr. Eulah Pont and Dr. Loreta Ave   TONSILLECTOMY AND ADENOIDECTOMY      Allergies  Allergen Reactions   Procaine Hcl Shortness Of Breath    Ok to use sq lidocaine for iv starts   Ciprofloxacin     Unknown reaction   Macrobid [Nitrofurantoin Macrocrystal]     Unknown reaction   Mupirocin Dermatitis    Patient states toe turned red after applying medication.   Sulfonamide Derivatives Hives   Tetanus Toxoid Swelling     REACTION: arm swells   Tuberculin Ppd Swelling   Penicillins Rash    Physical Exam: There is minimal edema to the third toes near the incision site on the plantar aspect of the PIPJ.  The incisions are healed well with no signs of hypertrophic or keloid scar formation.  No clinical signs of infection are seen.  Minimal hyperkeratosis distal aspect third toe bilateral.  No ulceration  Assessment/Plan of Care: 1. Post-operative state    Discussed clinical findings with patient today.  The distal third toe corns were shaved uneventfully with a sterile #312 blade today.  No further treatment is needed regarding postoperative care.  She will follow-up as scheduled for her at risk footcare appointment.   Clerance Lav, DPM, FACFAS [facility]     [address] [place], [place] [address]                Office [phone]  Fax [phone]

## 2023-10-03 ENCOUNTER — Other Ambulatory Visit: Payer: PPO

## 2023-10-06 DIAGNOSIS — R110 Nausea: Secondary | ICD-10-CM | POA: Diagnosis not present

## 2023-10-06 DIAGNOSIS — R051 Acute cough: Secondary | ICD-10-CM | POA: Diagnosis not present

## 2023-10-06 DIAGNOSIS — Z03818 Encounter for observation for suspected exposure to other biological agents ruled out: Secondary | ICD-10-CM | POA: Diagnosis not present

## 2023-10-06 DIAGNOSIS — R509 Fever, unspecified: Secondary | ICD-10-CM | POA: Diagnosis not present

## 2023-10-08 DIAGNOSIS — R059 Cough, unspecified: Secondary | ICD-10-CM | POA: Diagnosis not present

## 2023-10-08 DIAGNOSIS — U071 COVID-19: Secondary | ICD-10-CM | POA: Diagnosis not present

## 2023-10-10 ENCOUNTER — Ambulatory Visit: Payer: PPO | Admitting: Podiatry

## 2023-10-10 ENCOUNTER — Other Ambulatory Visit: Payer: Self-pay

## 2023-10-10 ENCOUNTER — Other Ambulatory Visit: Payer: Self-pay | Admitting: Nurse Practitioner

## 2023-10-10 ENCOUNTER — Other Ambulatory Visit (HOSPITAL_COMMUNITY): Payer: Self-pay

## 2023-10-10 DIAGNOSIS — E782 Mixed hyperlipidemia: Secondary | ICD-10-CM

## 2023-10-10 MED ORDER — EZETIMIBE 10 MG PO TABS
10.0000 mg | ORAL_TABLET | Freq: Every day | ORAL | 0 refills | Status: DC
  Filled 2023-10-10: qty 90, 90d supply, fill #0

## 2023-10-11 ENCOUNTER — Other Ambulatory Visit (HOSPITAL_COMMUNITY): Payer: Self-pay

## 2023-10-30 ENCOUNTER — Other Ambulatory Visit: Payer: Self-pay

## 2023-10-30 ENCOUNTER — Other Ambulatory Visit: Payer: Self-pay | Admitting: Internal Medicine

## 2023-10-30 ENCOUNTER — Other Ambulatory Visit (HOSPITAL_COMMUNITY): Payer: Self-pay

## 2023-10-30 MED ORDER — FAMOTIDINE 40 MG PO TABS
40.0000 mg | ORAL_TABLET | Freq: Every day | ORAL | 2 refills | Status: DC
  Filled 2023-10-30: qty 90, 90d supply, fill #0
  Filled 2024-01-30: qty 90, 90d supply, fill #1

## 2023-11-07 ENCOUNTER — Other Ambulatory Visit: Payer: Self-pay | Admitting: Podiatry

## 2023-11-08 NOTE — Progress Notes (Unsigned)
CPE  Assessment and Plan:   Encounter for general adult medical examination with abnormal findings 1 year Requested diabetic eye exam report  Controlled type 2 diabetes mellitus with complication, without long-term current use of insulin (HCC) Discussed general issues about diabetes pathophysiology and management., Educational material distributed., Suggested low cholesterol diet., Encouraged aerobic exercise., Discussed foot care., Reminded to get yearly retinal exam. -     Hemoglobin A1c ([facility])  Hyperlipidemia associated with T2DM (HCC) check lipids, statin/zetia for LDL goal <Brandi decrease fatty foods increase activity.  -    Lipid panel   CKD stage 3secondary to diabetes (HCC) Increase fluids, avoid NSAIDS, monitor sugars, will monitor -    CMP/GFR -    Routine UA with reflex culture - Microalbumin/creatinine urine ratio  Atherosclerosis of aorta (HCC) Control blood pressure, cholesterol, glucose, increase exercise.   CAD Cardiology following; defer EKG to them Control blood pressure, cholesterol, glucose, increase exercise.  Go to the ER if any chest pain, shortness of breath, nausea, dizziness.  Morbid obesity (HCC) - follow up 3 months for progress monitoring - increase veggies, decrease saturated fats and simple carbs - long discussion about weight loss, diet, and exercise  Labile hypertension - continue medications, DASH diet, exercise and monitor at home. Call if greater than 130/80.  -     CBC with Diff -     COMPLETE METABOLIC PANEL WITH GFR -     Urinalysis, Routine w reflex microscopic -     Microalbumin / Creatinine Urine Ratio  Malignant neoplasm of upper outer quadrant of right breast(HCC) - Could not tolerate Anastrazole or Tamoxifen - Continue to follow with oncology  Hypothyroidism, unspecified type -     TSH Hypothyroidism-check TSH level, continue medications the same, reminded to take on an empty stomach 30-73mins before food.   Medication  management -    Magnesium  Vitamin D deficiency Continue Vit D supplementation to maintain value in therapeutic level of 60-100  -     Vitamin D (25 hydroxy)  History of tobacco use -23 pack year Lung cancer screening UTD 06/29/22- benign, continue yearly exam  Chronic venous insufficiency - weight loss discussed, continue compression stockings and elevation  History of basal cell carcinoma (BCC) Follow up derm  Gastroesophageal reflux disease with esophagitis without hemorrhage Continue PPI/H2 blocker, diet discussed  Diverticulosis of large intestine without hemorrhage Bowel management, soluble fiber, monitor   Peripheral autonomic neuropathy of unknown cause Monitor Continue Gabapentin  Primary osteoarthritis of knee, unspecified laterality Ortho following, monitoring at this time   Screening for ischemic heart disease - EKG  Screening for AAA - U/S ABD Retroperitoneal LTD  Right lower quadrant abdominal pain Continue to monitor since it has improved If pain worsens or develop other symptoms notify the office and will order CT   Continue diet and meds as discussed. Further disposition pending results of labs. Over 30 minutes of exam, counseling, chart review, and critical decision making was performed  Future Appointments  Date Time Provider Department Center  11/09/2023  2:00 PM Raynelle Dick, NP [facility] None  12/12/2023  2:15 PM Freddie Breech, DPM [facility]  02/08/2024  2:30 PM Raynelle Dick, NP [facility] None  02/13/2024  1:45 PM Freddie Breech, DPM [facility]  04/01/2024  2:45 PM Serena Croissant, MD [facility] None  05/30/2024  2:30 PM Lucky Cowboy, MD [facility] None  11/11/2024  2:00 PM Raynelle Dick, NP [facility] None    HPI 77 y.o. [name]  presents for CPE. She has Diverticulosis of large intestine; History of tobacco use; Hypothyroidism; Controlled type 2 diabetes mellitus with complication, without  long-term current use of insulin (HCC); Morbid obesity (HCC); Chronic venous insufficiency; Peripheral autonomic neuropathy of unknown cause; Labile hypertension; History of basal cell carcinoma (BCC); Osteoarthritis of knee; Atherosclerosis of aorta (HCC) by CT scan in 2019; Vitamin D deficiency; Medication management; Gastroesophageal reflux disease with esophagitis; Type 2 diabetes mellitus with foot ulcer (CODE) (HCC); CKD stage 3 secondary to diabetes (HCC); Family history of heart disease; CAD (coronary artery disease); Malignant neoplasm of upper-outer quadrant of right breast in [name], estrogen receptor positive (HCC); Skin ulcer of third toe of right foot, limited to breakdown of skin (HCC); and Hammertoe of right foot on their problem list.  She is widowed, 3 grandchildren, 1 great grandson - 17 months.  She is retired from [employer] -   She is having pain in her lower abdomen that will radiate to her lower back.  Went to Urgent care and was put on Keflex - states since completing the course the pain has improved. She did not notice any urinary frequency, dysuria or hematuria. No change in bowel or bladder function.  She does have chronic knee pain and is using Tylenol and Voltaren gel with moderate relief. She continues t have pain and cold weather makes pain worse.   She had malignant neoplasm of upper outer quadrant of right breast.  She is followed by Dr. Pamelia Hoit.  She is still trying to get regulated on antiestrogen medication- had a lot of side effects from the medications .   GERD controlled on protonix and famotidine.   She is former smoker - 23.5 pack year hisotry, quit 2014, strong family hx of llung cancer, she had CT chest for coronary in 12/26/2019 which showed benign appearing nodules. Lung Cancer Low Dose CT 06/29/22- pulmonary emphysema.  BMI is There is no height or weight on file to calculate BMI., she is working on diet and exercise, trying to watch what she eats and smaller  portions. Down from 246 lb in 07/2018.  Wt Readings from Last 3 Encounters:  09/04/23 218 lb 3.2 oz (99 kg)  07/10/23 217 lb 6.4 oz (98.6 kg)  05/30/23 216 lb 9.6 oz (98.2 kg)   She is following with Dr. Sherlean Foot for CAD; in 01/2020 she had cardiac CT showing CCS of 693, 78th % for age/gender, Calcified plaque causing moderate stenosis in the prox LAD, distal RCA, proximal LCx, moderate stenosis, and also aortic atherosclerosis   Her blood pressure has been controlled at home, has been good, today their BP is   BP Readings from Last 3 Encounters:  09/04/23 132/60  07/10/23 116/62  06/06/23 (!) 120/46  She does workout. She denies chest pain, shortness of breath, dizziness.   She has been working on diet and exercise for Diabetes, doing lifestyle changes  with diabetic chronic kidney disease she is on ACE/ARB With hyperlipidemia IS at goal less than Brandi, she is on lipitor 40 mg and zetia 10 mg  she is on bASA she has idiopathic paraesthsias and is on gabapentin- follows with Dr. Eloy End every 90 days, did have infection/ulcer of left second toe in the past, no recent issues, checks feet daily  Eye exam Dr. Lorin Picket - saw recently, report requested  Stage 3 a CKD denies polydipsia, polyuria and visual disturbances.   Last A1C was:  Lab Results  Component Value Date   HGBA1C 6.4 (H) 09/04/2023  She is drinking more water. Lab Results  Component Value Date   EGFR 59 (L) 09/04/2023    Lab Results  Component Value Date   CHOL 122 09/04/2023   HDL 45 (L) 09/04/2023   LDLCALC 47 09/04/2023   TRIG 237 (H) 09/04/2023   CHOLHDL 2.7 09/04/2023   Patient is on Vitamin D supplement.   Lab Results  Component Value Date   VD25OH 93 05/30/2023     She is on thyroid medication. Her medication was not changed last visit 100 mcg 1 tab daily, takes on empty stomach with water only.   Lab Results  Component Value Date   TSH 2.05 09/04/2023  .     Current Medications:    Current Outpatient Medications (Endocrine & Metabolic):    levothyroxine (SYNTHROID) 100 MCG tablet, Take 1/2 tablet (50 mcg) on Mon-Wed-Fri and take 1 tablet(100 mcg) other days   levothyroxine (SYNTHROID) 100 MCG tablet, Take 1 tablet (100 mcg total) by mouth daily as directed.  Current Outpatient Medications (Cardiovascular):    atorvastatin (LIPITOR) 40 MG tablet, Take  1 tablet  Daily for Cholesterol                                            /                                                                   TAKE                                         BY                                                 MOUTH   atorvastatin (LIPITOR) 40 MG tablet, Take 1 tablet (40 mg total) by mouth daily for cholesterol.   ezetimibe (ZETIA) 10 MG tablet, Take 1 tablet (10 mg total) by mouth daily, for cholesterol.   furosemide (LASIX) 40 MG tablet, Take 1 tablet (40 mg total) by mouth 1-2 times daily for BP & Fluid Retention   Current Outpatient Medications (Analgesics):    acetaminophen (TYLENOL) 650 MG CR tablet, Take 650 mg by mouth every 8 (eight) hours as needed for pain.   aspirin 81 MG tablet, Take 81 mg by mouth daily.   Current Outpatient Medications (Other):    anastrozole (ARIMIDEX) 1 MG tablet, Take 1 tablet (1 mg total) by mouth daily.   Calcium Carbonate-Vit D-Min (CALCIUM 1200 PO), Take by mouth.   cholecalciferol (VITAMIN D3) 25 MCG (1000 UNIT) tablet, Take 3,000 Units by mouth daily.   cyclobenzaprine (FLEXERIL) 5 MG tablet, Take 1 tablet (5 mg total) by mouth 3 (three) times daily as needed for muscle spasms.   diclofenac Sodium (VOLTAREN) 1 % GEL, Apply 4 g topically 4 (four) times daily. (Patient taking differently: Apply 4 g topically 4 (four) times daily as needed (pain).)   dicyclomine (  BENTYL) 20 MG tablet, Take  1 tablet   3 x /day  as needed  for Nausea, Bloating, Cramping or Diarrhea.                              /                                                                    TAKE                                         BY                                                 MOUTH   dorzolamide-timolol (COSOPT) 22.3-6.8 MG/ML ophthalmic solution, Place 1 drop into both eyes 2 (two) times daily.   famotidine (PEPCID) 40 MG tablet, Take 1 tablet (40 mg total) by mouth daily.   Multiple Vitamin (MULTIVITAMIN) tablet, Take 1 tablet by mouth daily.   pantoprazole (PROTONIX) 40 MG tablet, Take 1 tablet (40 mg total) by mouth daily to prevent heartburn and indigestion   pregabalin (LYRICA) 75 MG capsule, Take 1 capsule (75 mg total) by mouth 3 (three) times daily.   Probiotic Product (PROBIOTIC DAILY PO), Take 1 capsule by mouth daily.  Medical History:  Past Medical History:  Diagnosis Date   Anemia    Arthritis    Chest tightness    Fatigue    GERD (gastroesophageal reflux disease)    Heart palpitations 12/06/2019   Hyperlipidemia    Hypothyroid    Internal hemorrhoids 05/04/2009   Qualifier: Diagnosis of  By: Nelson-Smith CMA (AAMA), Dottie     Melanoma [facility]) 2013   Excision/surgery only. No other treatment   Myalgia    Neuromuscular disorder (HCC)    Neuropathy   Obesity    Pre-diabetes    Prediabetes    Ulcer    Varicose veins    Health Maintenance:   Immunization History  Administered Date(s) Administered   Influenza, High Dose Seasonal PF 08/19/2014, 09/10/2015, 07/26/2016, 08/10/2017, 08/14/2018, 08/27/2019, 09/10/2020, 10/12/2021, 08/09/2022, 09/04/2023   Pneumococcal Conjugate-13 05/12/2014   Pneumococcal Polysaccharide-23 09/05/2007, 06/02/2015   Preventative care: Last colonoscopy: 2011, Due 2021, Dr. Azucena Cecil, [facility],refuses another  Last mammogram:02/15/22 Negative Last pap smear/pelvic exam: 02/2011- declines another DEXA:03/24/22, normal  Heart cath June 1999, no blockages, coronary spasms CTA 12/2019 IMPRESSION: 1. Coronary calcium score of 693. This was 78th percentile for age and sex matched control. 2. Normal coronary origin  with right dominance. 3. Calcified plaque causing moderate stenosis in the prox LAD, distal RCA, proximal LCx. 4.CAD-RADS 3. Moderate stenosis. Consider symptom-guided anti-ischemic pharmacotherapy as well as risk factor modification per guideline directed care. Additional analysis with CT FFR will be submitted and reported separately.  Prior vaccinations: TD or Tdap: ALLERGY- just swollen arm  Influenza: 08/2020 Pneumococcal: 2016 Prevnar13: 2015  Shingrix 45 dollars a shot Covid 19: plans to get through [facility]   Derm  follows skin surgery center, hx of melanoma to R shin, goes annually, last 2021 Dr. Lorin Picket for eye care, checking for glaucoma, 08/2021   Patient Care Team: Lucky Cowboy, MD as PCP - General (Internal Medicine) Jodelle Red, MD as PCP - Cardiology (Cardiology) Fredrich Birks, OD as Referring Physician (Optometry) Javier Glazier as Referring Physician (Physician Assistant) Abigail Miyamoto, MD as Consulting Physician (General Surgery) Serena Croissant, MD as Consulting Physician (Hematology and Oncology) Lonie Peak, MD as Attending Physician (Radiation Oncology) Sundra Aland, [facility] (Inactive) as Pharmacist (Pharmacist)  Allergies Allergies  Allergen Reactions   Procaine Hcl Shortness Of Breath    Ok to use sq lidocaine for iv starts   Ciprofloxacin     Unknown reaction   Macrobid [Nitrofurantoin Macrocrystal]     Unknown reaction   Mupirocin Dermatitis    Patient states toe turned red after applying medication.   Sulfonamide Derivatives Hives   Tetanus Toxoid Swelling    REACTION: arm swells   Tuberculin Ppd Swelling   Penicillins Rash    SURGICAL HISTORY She  has a past surgical history that includes Abdominal hysterectomy; Tonsillectomy and adenoidectomy; Cataract extraction (Bilateral); Nasal sinus surgery; Rotator cuff repair (Left, more than 7 years ago); Cyst removal hand (Left, 07/30/2018); and Breast lumpectomy with  radioactive seed localization (Right, 02/24/2022). FAMILY HISTORY Her family history includes Breast cancer in her cousin; Coronary artery disease (age of onset: 76) in her father; Dementia in her maternal aunt and mother; Lung cancer in her maternal grandmother and mother. SOCIAL HISTORY She  reports that she quit smoking about 10 years ago. Her smoking use included cigarettes. She started smoking about 58 years ago. She has a 25 pack-year smoking history. She has never used smokeless tobacco. She reports that she does not currently use alcohol. She reports that she does not use drugs.   Review of Systems:  Review of Systems  Constitutional:  Negative for chills, fever, malaise/fatigue and weight loss.  HENT:  Negative for congestion, hearing loss, sinus pain, sore throat and tinnitus.   Eyes:  Negative for blurred vision and double vision.  Respiratory:  Negative for cough, hemoptysis, sputum production, shortness of breath and wheezing.   Cardiovascular:  Negative for chest pain, palpitations, orthopnea, claudication, leg swelling and PND.  Gastrointestinal:  Positive for abdominal pain (RLQ is improving). Negative for blood in stool, constipation, diarrhea, heartburn, melena, nausea and vomiting.  Genitourinary: Negative.  Negative for dysuria and urgency.  Musculoskeletal:  Positive for joint pain (knees). Negative for back pain, falls, myalgias and neck pain.  Skin:  Negative for rash.  Neurological:  Positive for tingling (bil feet). Negative for dizziness, tremors, sensory change, weakness and headaches.  Endo/Heme/Allergies:  Negative for polydipsia. Does not bruise/bleed easily.  Psychiatric/Behavioral: Negative.  Negative for depression, memory loss, substance abuse and suicidal ideas. The patient is not nervous/anxious and does not have insomnia.   All other systems reviewed and are negative.   Physical Exam: There were no vitals taken for this visit. Wt Readings from Last 3  Encounters:  09/04/23 218 lb 3.2 oz (99 kg)  07/10/23 217 lb 6.4 oz (98.6 kg)  05/30/23 216 lb 9.6 oz (98.2 kg)   General Appearance: Well nourished, in no apparent distress. Eyes: PERRLA, EOMs, conjunctiva no swelling or erythema Sinuses: No Frontal/maxillary tenderness ENT/Mouth: Ext aud canals clear, TMs without erythema, bulging. No erythema, swelling, or exudate on post pharynx.  Tonsils not swollen or erythematous. Hearing normal.  Neck: Supple, thyroid  normal.  Respiratory: Respiratory effort normal, BS equal bilaterally without rales, rhonchi, wheezing or stridor.  Cardio: RRR with no MRGs. Brisk peripheral pulses with mild edema bil lower legs  Abdomen: Soft, + BS,  Very mildly tender in RLQ , no masses Lymphatics: Non tender without lymphadenopathy.  Musculoskeletal: Full ROM, 5/5 strength, normal steady gait Skin:  Warm, dry without rashes, lesions, ecchymosis.  Neuro: Cranial nerves intact. Normal muscle tone, no cerebellar symptoms.Bil feet with diminished sensation throughout to monofilament Psych: Awake and oriented X 3, normal affect, Insight and Judgment appropriate.  Breasts: declines today GU: no concerns, defer  EKG: Sinus bradycardia, no ST changes AAA: < 3 cm   Tabithia Stroder Hollie Salk, NP 12:38 PM [facility]

## 2023-11-09 ENCOUNTER — Other Ambulatory Visit (HOSPITAL_COMMUNITY): Payer: Self-pay

## 2023-11-09 ENCOUNTER — Ambulatory Visit (INDEPENDENT_AMBULATORY_CARE_PROVIDER_SITE_OTHER): Payer: PPO | Admitting: Nurse Practitioner

## 2023-11-09 ENCOUNTER — Encounter: Payer: Self-pay | Admitting: Nurse Practitioner

## 2023-11-09 VITALS — BP 110/62 | HR 55 | Temp 97.9°F | Ht 66.0 in | Wt 215.5 lb

## 2023-11-09 DIAGNOSIS — Z0000 Encounter for general adult medical examination without abnormal findings: Secondary | ICD-10-CM | POA: Diagnosis not present

## 2023-11-09 DIAGNOSIS — E039 Hypothyroidism, unspecified: Secondary | ICD-10-CM

## 2023-11-09 DIAGNOSIS — I872 Venous insufficiency (chronic) (peripheral): Secondary | ICD-10-CM

## 2023-11-09 DIAGNOSIS — Z136 Encounter for screening for cardiovascular disorders: Secondary | ICD-10-CM | POA: Diagnosis not present

## 2023-11-09 DIAGNOSIS — N1831 Chronic kidney disease, stage 3a: Secondary | ICD-10-CM

## 2023-11-09 DIAGNOSIS — I700 Atherosclerosis of aorta: Secondary | ICD-10-CM

## 2023-11-09 DIAGNOSIS — Z1389 Encounter for screening for other disorder: Secondary | ICD-10-CM

## 2023-11-09 DIAGNOSIS — E559 Vitamin D deficiency, unspecified: Secondary | ICD-10-CM | POA: Diagnosis not present

## 2023-11-09 DIAGNOSIS — I10 Essential (primary) hypertension: Secondary | ICD-10-CM | POA: Diagnosis not present

## 2023-11-09 DIAGNOSIS — K219 Gastro-esophageal reflux disease without esophagitis: Secondary | ICD-10-CM | POA: Diagnosis not present

## 2023-11-09 DIAGNOSIS — E1122 Type 2 diabetes mellitus with diabetic chronic kidney disease: Secondary | ICD-10-CM | POA: Diagnosis not present

## 2023-11-09 DIAGNOSIS — I2510 Atherosclerotic heart disease of native coronary artery without angina pectoris: Secondary | ICD-10-CM

## 2023-11-09 DIAGNOSIS — C50411 Malignant neoplasm of upper-outer quadrant of right female breast: Secondary | ICD-10-CM

## 2023-11-09 DIAGNOSIS — E785 Hyperlipidemia, unspecified: Secondary | ICD-10-CM

## 2023-11-09 DIAGNOSIS — Z0001 Encounter for general adult medical examination with abnormal findings: Secondary | ICD-10-CM

## 2023-11-09 DIAGNOSIS — E1169 Type 2 diabetes mellitus with other specified complication: Secondary | ICD-10-CM

## 2023-11-09 DIAGNOSIS — N183 Type 2 diabetes mellitus with diabetic chronic kidney disease: Secondary | ICD-10-CM

## 2023-11-09 DIAGNOSIS — Z87891 Personal history of nicotine dependence: Secondary | ICD-10-CM

## 2023-11-09 DIAGNOSIS — K5730 Diverticulosis of large intestine without perforation or abscess without bleeding: Secondary | ICD-10-CM

## 2023-11-09 DIAGNOSIS — Z79899 Other long term (current) drug therapy: Secondary | ICD-10-CM

## 2023-11-09 DIAGNOSIS — M1710 Unilateral primary osteoarthritis, unspecified knee: Secondary | ICD-10-CM

## 2023-11-09 DIAGNOSIS — E1140 Type 2 diabetes mellitus with diabetic neuropathy, unspecified: Secondary | ICD-10-CM

## 2023-11-09 DIAGNOSIS — J439 Emphysema, unspecified: Secondary | ICD-10-CM

## 2023-11-09 MED ORDER — PREGABALIN 100 MG PO CAPS: 100.0000 mg | ORAL_CAPSULE | Freq: Three times a day (TID) | ORAL | 2 refills | Status: DC

## 2023-11-09 NOTE — Patient Instructions (Signed)

## 2023-11-10 LAB — COMPLETE METABOLIC PANEL WITH GFR
AG Ratio: 1.5 (calc) (ref 1.0–2.5)
ALT: 16 U/L (ref 6–29)
AST: 17 U/L (ref 10–35)
Albumin: 4.1 g/dL (ref 3.6–5.1)
Alkaline phosphatase (APISO): 103 U/L (ref 37–153)
BUN/Creatinine Ratio: 19 (calc) (ref 6–22)
BUN: 20 mg/dL (ref 7–25)
CO2: 30 mmol/L (ref 20–32)
Calcium: 9.3 mg/dL (ref 8.6–10.4)
Chloride: 105 mmol/L (ref 98–110)
Creat: 1.03 mg/dL — ABNORMAL HIGH (ref 0.60–1.00)
Globulin: 2.8 g/dL (ref 1.9–3.7)
Glucose, Bld: 91 mg/dL (ref 65–99)
Potassium: 4.1 mmol/L (ref 3.5–5.3)
Sodium: 144 mmol/L (ref 135–146)
Total Bilirubin: 0.9 mg/dL (ref 0.2–1.2)
Total Protein: 6.9 g/dL (ref 6.1–8.1)
eGFR: 57 mL/min/{1.73_m2} — ABNORMAL LOW (ref >=60)

## 2023-11-10 LAB — TSH: TSH: 1.37 m[IU]/L (ref 0.40–4.50)

## 2023-11-10 LAB — URINALYSIS, ROUTINE W REFLEX MICROSCOPIC
Bilirubin Urine: NEGATIVE
Glucose, UA: NEGATIVE
Hgb urine dipstick: NEGATIVE
Ketones, ur: NEGATIVE
Leukocytes,Ua: NEGATIVE
Nitrite: NEGATIVE
Protein, ur: NEGATIVE
Specific Gravity, Urine: 1.004 (ref 1.001–1.035)
pH: 6 (ref 5.0–8.0)

## 2023-11-10 LAB — CBC WITH DIFFERENTIAL/PLATELET
Absolute Lymphocytes: 2950 {cells}/uL (ref 850–3900)
Absolute Monocytes: 670 {cells}/uL (ref 200–950)
Basophils Absolute: 40 {cells}/uL (ref 0–200)
Basophils Relative: 0.4 %
Eosinophils Absolute: 160 {cells}/uL (ref 15–500)
Eosinophils Relative: 1.6 %
HCT: 42.3 % (ref 35.0–45.0)
Hemoglobin: 13.9 g/dL (ref 11.7–15.5)
MCH: 28.7 pg (ref 27.0–33.0)
MCHC: 32.9 g/dL (ref 32.0–36.0)
MCV: 87.4 fL (ref 80.0–100.0)
MPV: 11.4 fL (ref 7.5–12.5)
Monocytes Relative: 6.7 %
Neutro Abs: 6180 {cells}/uL (ref 1500–7800)
Neutrophils Relative %: 61.8 %
Platelets: 251 10*3/uL (ref 140–400)
RBC: 4.84 10*6/uL (ref 3.80–5.10)
RDW: 13.6 % (ref 11.0–15.0)
Total Lymphocyte: 29.5 %
WBC: 10 10*3/uL (ref 3.8–10.8)

## 2023-11-10 LAB — VITAMIN D 25 HYDROXY (VIT D DEFICIENCY, FRACTURES): Vit D, 25-Hydroxy: 77 ng/mL (ref 30–100)

## 2023-11-10 LAB — MAGNESIUM: Magnesium: 2.5 mg/dL (ref 1.5–2.5)

## 2023-11-10 LAB — MICROALBUMIN / CREATININE URINE RATIO
Creatinine, Urine: 15 mg/dL — ABNORMAL LOW (ref 20–275)
Microalb, Ur: <0.2 mg/dL

## 2023-11-13 ENCOUNTER — Other Ambulatory Visit: Payer: Self-pay | Admitting: Nurse Practitioner

## 2023-11-13 ENCOUNTER — Other Ambulatory Visit (HOSPITAL_COMMUNITY): Payer: Self-pay

## 2023-11-13 ENCOUNTER — Telehealth: Payer: Self-pay | Admitting: Nurse Practitioner

## 2023-11-13 DIAGNOSIS — E1140 Type 2 diabetes mellitus with diabetic neuropathy, unspecified: Secondary | ICD-10-CM

## 2023-11-13 MED ORDER — PREGABALIN 100 MG PO CAPS
100.0000 mg | ORAL_CAPSULE | Freq: Three times a day (TID) | ORAL | 2 refills | Status: DC
  Filled 2023-11-13: qty 270, 90d supply, fill #0
  Filled 2024-02-06 – 2024-02-09 (×2): qty 270, 90d supply, fill #1
  Filled 2024-05-15 – 2024-05-23 (×2): qty 270, 90d supply, fill #2

## 2023-11-13 NOTE — Progress Notes (Signed)
Lyrica was sent to [facility]- originally sent to [facility] order and she no longer uses that pharmacy

## 2023-11-13 NOTE — Telephone Encounter (Signed)
Pregabalin was sent to [facility] by [facility]. The patient does not use that pharmacy anymore. If you could send Pregabalin to [facility]

## 2023-12-08 ENCOUNTER — Other Ambulatory Visit (HOSPITAL_COMMUNITY): Payer: Self-pay

## 2023-12-12 ENCOUNTER — Ambulatory Visit: Payer: PPO | Admitting: Podiatry

## 2023-12-12 ENCOUNTER — Encounter: Payer: Self-pay | Admitting: Podiatry

## 2023-12-12 VITALS — Ht 66.0 in | Wt 215.0 lb

## 2023-12-12 DIAGNOSIS — M79674 Pain in right toe(s): Secondary | ICD-10-CM

## 2023-12-12 DIAGNOSIS — E1142 Type 2 diabetes mellitus with diabetic polyneuropathy: Secondary | ICD-10-CM

## 2023-12-12 DIAGNOSIS — B351 Tinea unguium: Secondary | ICD-10-CM

## 2023-12-12 DIAGNOSIS — L84 Corns and callosities: Secondary | ICD-10-CM

## 2023-12-12 DIAGNOSIS — M79675 Pain in left toe(s): Secondary | ICD-10-CM

## 2023-12-18 NOTE — Progress Notes (Signed)
Subjective:  Patient ID: [name], female    DOB: 1946/03/02,  MRN: [id number]   Subjective: Patient presents today at risk foot care with history of diabetic neuropathy and preulcerative lesion(s) of both feet. Pain prevent(s) comfortable ambulation. Aggravating factor is weightbearing with and without shoegear. Symptoms relieved with periodic professional debridement. She has had flexor tenotomies performed by Dr. Carlota Raspberry on both 3rd digits. States left 2nd toe has similar blood blister and she has been cleaning it daily with alcohol.  Lucky Cowboy, MD is patient's PCP. L  Current Outpatient Medications on File Prior to Visit  Medication Sig Dispense Refill   acetaminophen (TYLENOL) 650 MG CR tablet Take 650 mg by mouth every 8 (eight) hours as needed for pain.     anastrozole (ARIMIDEX) 1 MG tablet Take 1 tablet (1 mg total) by mouth daily. 90 tablet 3   aspirin 81 MG tablet Take 81 mg by mouth daily.     atorvastatin (LIPITOR) 40 MG tablet Take 1 tablet (40 mg total) by mouth daily for cholesterol. 90 tablet 2   Calcium Carbonate-Vit D-Min (CALCIUM 1200 PO) Take by mouth.     cholecalciferol (VITAMIN D3) 25 MCG (1000 UNIT) tablet Take 3,000 Units by mouth daily.     cyclobenzaprine (FLEXERIL) 5 MG tablet Take 1 tablet (5 mg total) by mouth 3 (three) times daily as needed for muscle spasms. 30 tablet 0   diclofenac Sodium (VOLTAREN) 1 % GEL Apply 4 g topically 4 (four) times daily. (Patient taking differently: Apply 4 g topically 4 (four) times daily as needed (pain).) 4 g 2   dicyclomine (BENTYL) 20 MG tablet Take  1 tablet   3 x /day  as needed  for Nausea, Bloating, Cramping or Diarrhea.                              /                                                                   TAKE                                         BY                                                 MOUTH 90 tablet 0   dorzolamide-timolol (COSOPT) 22.3-6.8 MG/ML ophthalmic solution Place 1 drop into both  eyes 2 (two) times daily.  1   ezetimibe (ZETIA) 10 MG tablet Take 1 tablet (10 mg total) by mouth daily, for cholesterol. 90 tablet 0   famotidine (PEPCID) 40 MG tablet Take 1 tablet (40 mg total) by mouth daily. 90 tablet 2   furosemide (LASIX) 40 MG tablet Take 1 tablet (40 mg total) by mouth 1-2 times daily for BP & Fluid Retention 180 tablet 2   levothyroxine (SYNTHROID) 100 MCG tablet Take 1/2 tablet (50 mcg) on Mon-Wed-Fri and take 1 tablet(100 mcg) other days  Multiple Vitamin (MULTIVITAMIN) tablet Take 1 tablet by mouth daily.     pantoprazole (PROTONIX) 40 MG tablet Take 1 tablet (40 mg total) by mouth daily to prevent heartburn and indigestion 90 tablet 1   pregabalin (LYRICA) 100 MG capsule Take 1 capsule (100 mg total) by mouth 3 (three) times daily. 270 capsule 2   Probiotic Product (PROBIOTIC DAILY PO) Take 1 capsule by mouth daily.     No current facility-administered medications on file prior to visit.     Allergies  Allergen Reactions   Procaine Hcl Shortness Of Breath    Ok to use sq lidocaine for iv starts   Ciprofloxacin     Unknown reaction   Macrobid [Nitrofurantoin Macrocrystal]     Unknown reaction   Mupirocin Dermatitis    Patient states toe turned red after applying medication.   Sulfonamide Derivatives Hives   Tetanus Toxoid Swelling    REACTION: arm swells   Tuberculin Ppd Swelling   Penicillins Rash     Objective: There were no vitals filed for this visit.  LUCI [name] is a pleasant 78 y.o. female obese in NAD.Marland Kitchen AAO X 3.  Vascular Examination: Capillary refill time immediate b/l. Vascular status intact b/l with palpable pedal pulses. Pedal hair absent b/l. No pain with calf compression b/l. Skin temperature gradient WNL b/l. +Varicosities b/l LE. +1 pitting edema noted BLE. No ischemia or gangrene noted b/l LE. No cyanosis or clubbing noted b/l LE  Dermatological Examination: No interdigital macerations noted b/l LE. Toenails 1-5 b/l  elongated, discolored, dystrophic, thickened, crumbly with subungual debris and tenderness to dorsal palpation. Preulcerative lesion noted L 2nd toe. There is visible subdermal hemorrhage. There is no surrounding erythema, no edema, no drainage, no odor, no fluctuance.  Musculoskeletal Examination: HAV with bunion deformity noted b/l LE. Severe hammertoe deformity noted 2-5 bilaterally.  Neurological Examination: Bilateral 3rd digits now in rectus position s/p flexor tenotomies. Pt has subjective symptoms of neuropathy. Protective sensation intact 5/5 intact bilaterally with 10g monofilament b/l.  Assessment/Plan: 1. Pain due to onychomycosis of toenails of both feet   2. Pre-ulcerative corn or callous   3. Diabetic peripheral neuropathy associated with type 2 diabetes mellitus (HCC)     -Consent given for treatment as described below: -Examined patient. -She is to schedule appointment with Dr. Burna Mortimer for any acute issues and will see me in 9 weeks for at risk foot care. -Patient to continue soft, supportive shoe gear daily. -Toenails 1-5 b/l were debrided in length and girth with sterile nail nippers and dremel without iatrogenic bleeding.  -Preulcerative lesion pared left second digit utilizing sterile scalpel blade. Total number pared=1. -Patient/POA to call should there be question/concern in the interim.   Return in about 9 weeks (around 02/13/2024).  Freddie Breech, DPM      [place] LOCATION: [address] [place] [address]                   Office [phone]   [facility] LOCATION: [address] [place], [place] [address] Office [phone]

## 2023-12-28 ENCOUNTER — Other Ambulatory Visit: Payer: Self-pay

## 2023-12-28 ENCOUNTER — Other Ambulatory Visit: Payer: Self-pay | Admitting: Internal Medicine

## 2023-12-28 ENCOUNTER — Other Ambulatory Visit (HOSPITAL_COMMUNITY): Payer: Self-pay

## 2023-12-28 DIAGNOSIS — I10 Essential (primary) hypertension: Secondary | ICD-10-CM

## 2023-12-29 ENCOUNTER — Other Ambulatory Visit (HOSPITAL_COMMUNITY): Payer: Self-pay

## 2023-12-29 ENCOUNTER — Other Ambulatory Visit: Payer: Self-pay

## 2023-12-29 MED ORDER — FUROSEMIDE 40 MG PO TABS
40.0000 mg | ORAL_TABLET | Freq: Two times a day (BID) | ORAL | 2 refills | Status: DC
  Filled 2023-12-29: qty 180, 90d supply, fill #0
  Filled 2024-06-20: qty 180, 90d supply, fill #1

## 2024-01-04 ENCOUNTER — Other Ambulatory Visit: Payer: Self-pay

## 2024-01-04 ENCOUNTER — Other Ambulatory Visit (HOSPITAL_COMMUNITY): Payer: Self-pay

## 2024-01-04 DIAGNOSIS — E782 Mixed hyperlipidemia: Secondary | ICD-10-CM

## 2024-01-04 MED ORDER — EZETIMIBE 10 MG PO TABS
10.0000 mg | ORAL_TABLET | Freq: Every day | ORAL | 0 refills | Status: DC
  Filled 2024-01-04: qty 90, 90d supply, fill #0

## 2024-01-15 ENCOUNTER — Other Ambulatory Visit (HOSPITAL_COMMUNITY): Payer: Self-pay

## 2024-01-15 ENCOUNTER — Other Ambulatory Visit: Payer: Self-pay | Admitting: Internal Medicine

## 2024-01-17 ENCOUNTER — Other Ambulatory Visit: Payer: Self-pay

## 2024-01-17 ENCOUNTER — Other Ambulatory Visit (HOSPITAL_COMMUNITY): Payer: Self-pay

## 2024-01-17 MED ORDER — DORZOLAMIDE HCL-TIMOLOL MAL 2-0.5 % OP SOLN
1.0000 [drp] | Freq: Two times a day (BID) | OPHTHALMIC | 2 refills | Status: DC
Start: 2024-01-10 — End: 2024-04-22
  Filled 2024-01-17: qty 10, 100d supply, fill #0
  Filled 2024-04-16: qty 10, 100d supply, fill #1

## 2024-01-17 MED ORDER — NIRMATRELVIR&RITONAVIR 300/100 20 X 150 MG & 10 X 100MG PO TBPK
ORAL_TABLET | ORAL | 0 refills | Status: DC
  Filled 2024-01-17: qty 30, 5d supply, fill #0

## 2024-01-17 MED ORDER — EZETIMIBE 10 MG PO TABS
10.0000 mg | ORAL_TABLET | Freq: Every day | ORAL | 3 refills | Status: DC
  Filled 2024-01-17 – 2024-04-02 (×2): qty 90, 90d supply, fill #0

## 2024-01-17 MED ORDER — GENTAMICIN SULFATE 0.1 % EX CREA
TOPICAL_CREAM | Freq: Every day | CUTANEOUS | 0 refills | Status: DC
  Filled 2024-01-17: qty 30, 30d supply, fill #0

## 2024-01-17 MED ORDER — ONDANSETRON HCL 4 MG PO TABS
4.0000 mg | ORAL_TABLET | Freq: Three times a day (TID) | ORAL | 1 refills | Status: DC | PRN
  Filled 2024-01-17: qty 90, 30d supply, fill #0

## 2024-01-18 ENCOUNTER — Other Ambulatory Visit: Payer: Self-pay

## 2024-01-18 ENCOUNTER — Other Ambulatory Visit (HOSPITAL_COMMUNITY): Payer: Self-pay

## 2024-01-18 ENCOUNTER — Encounter: Payer: Self-pay | Admitting: Pharmacist

## 2024-01-18 DIAGNOSIS — K5730 Diverticulosis of large intestine without perforation or abscess without bleeding: Secondary | ICD-10-CM

## 2024-01-18 DIAGNOSIS — R102 Pelvic and perineal pain: Secondary | ICD-10-CM

## 2024-01-18 DIAGNOSIS — R1030 Lower abdominal pain, unspecified: Secondary | ICD-10-CM

## 2024-01-18 MED ORDER — DICYCLOMINE HCL 20 MG PO TABS
20.0000 mg | ORAL_TABLET | Freq: Three times a day (TID) | ORAL | 0 refills | Status: DC | PRN
  Filled 2024-01-18: qty 90, 30d supply, fill #0

## 2024-01-18 MED ORDER — LEVOTHYROXINE SODIUM 100 MCG PO TABS
100.0000 ug | ORAL_TABLET | Freq: Every day | ORAL | 0 refills | Status: DC
  Filled 2024-01-18: qty 71, 90d supply, fill #0

## 2024-01-19 ENCOUNTER — Other Ambulatory Visit (HOSPITAL_COMMUNITY): Payer: Self-pay

## 2024-01-22 ENCOUNTER — Other Ambulatory Visit (HOSPITAL_COMMUNITY): Payer: Self-pay

## 2024-01-25 ENCOUNTER — Ambulatory Visit (INDEPENDENT_AMBULATORY_CARE_PROVIDER_SITE_OTHER)

## 2024-01-25 ENCOUNTER — Encounter: Payer: Self-pay | Admitting: Pharmacist

## 2024-01-25 ENCOUNTER — Other Ambulatory Visit: Payer: Self-pay

## 2024-01-25 ENCOUNTER — Other Ambulatory Visit (HOSPITAL_COMMUNITY): Payer: Self-pay

## 2024-01-25 ENCOUNTER — Encounter: Payer: Self-pay | Admitting: Podiatry

## 2024-01-25 ENCOUNTER — Ambulatory Visit: Payer: PPO | Admitting: Podiatry

## 2024-01-25 DIAGNOSIS — E11621 Type 2 diabetes mellitus with foot ulcer: Secondary | ICD-10-CM | POA: Diagnosis not present

## 2024-01-25 DIAGNOSIS — L84 Corns and callosities: Secondary | ICD-10-CM | POA: Diagnosis not present

## 2024-01-25 MED ORDER — DOXYCYCLINE HYCLATE 100 MG PO TABS
100.0000 mg | ORAL_TABLET | Freq: Two times a day (BID) | ORAL | 1 refills | Status: DC
  Filled 2024-01-25: qty 20, 10d supply, fill #0

## 2024-01-26 ENCOUNTER — Other Ambulatory Visit (HOSPITAL_BASED_OUTPATIENT_CLINIC_OR_DEPARTMENT_OTHER): Payer: Self-pay

## 2024-01-26 ENCOUNTER — Other Ambulatory Visit: Payer: Self-pay

## 2024-01-26 ENCOUNTER — Other Ambulatory Visit (HOSPITAL_COMMUNITY): Payer: Self-pay

## 2024-01-26 NOTE — Progress Notes (Signed)
 Subjective:   Patient ID: [name], female   DOB: 78 y.o.   MRN: [id number]   HPI Patient states that over the last couple weeks she has developed a lot of pain in the left second toe with keratotic tissue formation and she has noted some odor recently and some mild redness with history of this several years ago   ROS      Objective:  Physical Exam  Neurovascular status found to be intact elongated second digit left with distal keratotic lesion that is irritated with thickness of the nailbed slight redness at the distal inner phalangeal joint no proximal edema erythema drainage noted proximal to that area and no systemic signs of infection.  The area is crusted and there is a mild odor with no obvious discharge.  Upon debridement I did note an area of tissue breakdown at the distal tuft measuring about 3 x 3 mm with no bone tendon subcutaneous exposure     Assessment:  Probability for a frictional low-grade infection of the left second toe with small ulceration formation with digital deformity as the precipitating factor     Plan:  H&P precautionary x-ray taken and sharp sterile debridement accomplished today with cleaning of the area with a small breakdown of tissue measuring 3 x 3 mm.  I applied a small amount of Iodosorb with sterile dressing I advised on soaks open toed shoes cushioning and I placed on doxycycline for 10 days twice daily.  Should heal uneventfully with strict instructions if any redness swelling or changes were to occur to come to the office and contact us immediately.  X-rays indicate no signs of osteolysis in this area elongated second digit noted which probably causes the increased friction against the area

## 2024-01-27 ENCOUNTER — Other Ambulatory Visit (HOSPITAL_COMMUNITY): Payer: Self-pay

## 2024-01-30 ENCOUNTER — Other Ambulatory Visit (HOSPITAL_COMMUNITY): Payer: Self-pay

## 2024-01-31 ENCOUNTER — Other Ambulatory Visit (HOSPITAL_COMMUNITY): Payer: Self-pay

## 2024-02-06 ENCOUNTER — Other Ambulatory Visit: Payer: Self-pay

## 2024-02-06 ENCOUNTER — Other Ambulatory Visit (HOSPITAL_COMMUNITY): Payer: Self-pay

## 2024-02-07 ENCOUNTER — Other Ambulatory Visit: Payer: Self-pay

## 2024-02-08 ENCOUNTER — Ambulatory Visit: Payer: PPO | Admitting: Nurse Practitioner

## 2024-02-13 ENCOUNTER — Encounter: Payer: Self-pay | Admitting: Podiatry

## 2024-02-13 ENCOUNTER — Ambulatory Visit: Payer: PPO | Admitting: Podiatry

## 2024-02-13 VITALS — Ht 66.0 in | Wt 215.0 lb

## 2024-02-13 DIAGNOSIS — M79674 Pain in right toe(s): Secondary | ICD-10-CM | POA: Diagnosis not present

## 2024-02-13 DIAGNOSIS — L97521 Non-pressure chronic ulcer of other part of left foot limited to breakdown of skin: Secondary | ICD-10-CM

## 2024-02-13 DIAGNOSIS — M79675 Pain in left toe(s): Secondary | ICD-10-CM | POA: Diagnosis not present

## 2024-02-13 DIAGNOSIS — B351 Tinea unguium: Secondary | ICD-10-CM | POA: Diagnosis not present

## 2024-02-13 DIAGNOSIS — L84 Corns and callosities: Secondary | ICD-10-CM

## 2024-02-13 DIAGNOSIS — E1142 Type 2 diabetes mellitus with diabetic polyneuropathy: Secondary | ICD-10-CM | POA: Diagnosis not present

## 2024-02-13 MED ORDER — FLUCONAZOLE 150 MG PO TABS: 150.0000 mg | ORAL_TABLET | ORAL | 0 refills | Status: DC

## 2024-02-13 NOTE — Patient Instructions (Signed)
   DRESSING CHANGES LEFT 2ND TOE:   PHARMACY SHOPPING LIST: Saline or Wound Cleanser for cleaning wound 2 x 2 inch sterile gauze for cleaning wound GENTAMICIN CREAM   IF PRESCRIBED ORAL ANTIBIOTICS, TAKE ALL MEDICATION AS PRESCRIBED UNTIL ALL ARE GONE.   KEEP LEFT FOOT DRY AT ALL TIMES!!!!  CLEANSE ULCER WITH SALINE OR WOUND CLEANSER.  DAB DRY WITH GAUZE SPONGE.  APPLY A LIGHT AMOUNT OF GENTAMICIN CREAM TO BASE OF ULCER.  APPLY OUTER DRESSING AS INSTRUCTED.  WEAR SURGICAL SHOE/BOOT DAILY AT ALL TIMES. IF SUPPLIED, WEAR HEEL PROTECTORS AT ALL TIMES WHEN IN BED.  DO NOT WALK BAREFOOT!!!  IF YOU EXPERIENCE ANY FEVER, CHILLS, NIGHTSWEATS, NAUSEA OR VOMITING, ELEVATED OR LOW BLOOD SUGARS, REPORT TO EMERGENCY ROOM.  IF YOU EXPERIENCE INCREASED REDNESS, PAIN, SWELLING, DISCOLORATION, ODOR, PUS, DRAINAGE OR WARMTH OF YOUR FOOT, REPORT TO EMERGENCY ROOM.

## 2024-02-13 NOTE — Progress Notes (Unsigned)
  Subjective:  Patient ID: [name], female    DOB: 1946-10-04,  MRN: [id number]  [name] presents to clinic today for {jgcomplaint:23593}  Chief Complaint  Patient presents with   Nail Problem    Pt is here for [facility] last A1C was 6.4 PCP is Dr Jearl Klinefelter and LOV was in [name].    New problem(s): None. {jgcomplaint:23593}  PCP is Crain, Whitney L, PA.  Allergies  Allergen Reactions   Procaine Hcl Shortness Of Breath    Ok to use sq lidocaine for iv starts   Ciprofloxacin     Unknown reaction   Macrobid [Nitrofurantoin Macrocrystal]     Unknown reaction   Mupirocin Dermatitis    Patient states toe turned red after applying medication.   Sulfonamide Derivatives Hives   Tetanus Toxoid Swelling    REACTION: arm swells   Tuberculin Ppd Swelling   Penicillins Rash    Review of Systems: Negative except as noted in the HPI.  Objective: No changes noted in today's physical examination. There were no vitals filed for this visit. [name] is a pleasant 78 y.o. female {jgbodyhabitus:24098} AAO x 3.  Assessment/Plan: No diagnosis found.  No orders of the defined types were placed in this encounter.   None -Patient/POA to call should there be question/concern in the interim.   No follow-ups on file.  Freddie Breech, DPM      [place] LOCATION: [address], [place] [address]                   Office [phone]   [facility] LOCATION: [address] [place], [place] [address] Office [phone]

## 2024-02-15 ENCOUNTER — Encounter: Payer: Self-pay | Admitting: Urgent Care

## 2024-02-15 ENCOUNTER — Ambulatory Visit: Payer: PPO | Admitting: Urgent Care

## 2024-02-15 VITALS — BP 123/68 | HR 53 | Ht 66.0 in | Wt 218.8 lb

## 2024-02-15 DIAGNOSIS — Z170 Estrogen receptor positive status [ER+]: Secondary | ICD-10-CM

## 2024-02-15 DIAGNOSIS — E1122 Type 2 diabetes mellitus with diabetic chronic kidney disease: Secondary | ICD-10-CM | POA: Diagnosis not present

## 2024-02-15 DIAGNOSIS — C50411 Malignant neoplasm of upper-outer quadrant of right female breast: Secondary | ICD-10-CM | POA: Diagnosis not present

## 2024-02-15 DIAGNOSIS — R7303 Prediabetes: Secondary | ICD-10-CM | POA: Insufficient documentation

## 2024-02-15 DIAGNOSIS — Z79899 Other long term (current) drug therapy: Secondary | ICD-10-CM | POA: Diagnosis not present

## 2024-02-15 DIAGNOSIS — G9009 Other idiopathic peripheral autonomic neuropathy: Secondary | ICD-10-CM

## 2024-02-15 DIAGNOSIS — L97529 Non-pressure chronic ulcer of other part of left foot with unspecified severity: Secondary | ICD-10-CM

## 2024-02-15 DIAGNOSIS — Z5181 Encounter for therapeutic drug level monitoring: Secondary | ICD-10-CM

## 2024-02-15 DIAGNOSIS — T452X1A Poisoning by vitamins, accidental (unintentional), initial encounter: Secondary | ICD-10-CM

## 2024-02-15 DIAGNOSIS — I10 Essential (primary) hypertension: Secondary | ICD-10-CM

## 2024-02-15 DIAGNOSIS — N183 Chronic kidney disease, stage 3 unspecified: Secondary | ICD-10-CM

## 2024-02-15 DIAGNOSIS — K2100 Gastro-esophageal reflux disease with esophagitis, without bleeding: Secondary | ICD-10-CM | POA: Diagnosis not present

## 2024-02-15 DIAGNOSIS — H409 Unspecified glaucoma: Secondary | ICD-10-CM | POA: Insufficient documentation

## 2024-02-15 DIAGNOSIS — E782 Mixed hyperlipidemia: Secondary | ICD-10-CM | POA: Insufficient documentation

## 2024-02-15 DIAGNOSIS — R600 Localized edema: Secondary | ICD-10-CM | POA: Insufficient documentation

## 2024-02-15 DIAGNOSIS — E039 Hypothyroidism, unspecified: Secondary | ICD-10-CM

## 2024-02-15 MED ORDER — DULOXETINE HCL 20 MG PO CPEP: 20.0000 mg | ORAL_CAPSULE | Freq: Every day | ORAL | 3 refills | Status: DC

## 2024-02-15 MED ORDER — PANTOPRAZOLE SODIUM 40 MG PO TBEC: 40.0000 mg | DELAYED_RELEASE_TABLET | Freq: Every day | ORAL | 1 refills | Status: DC

## 2024-02-15 NOTE — Patient Instructions (Addendum)
 Stop your extra Vit D 3. You are already getting this with your calcium tablet. Stop taking the famotidine. Let me know if you develop reflux symptoms. Stop taking the cinnamon.  Continue all other meds as listed on this form.  Return in three months for your medicare annual wellness visit.

## 2024-02-15 NOTE — Progress Notes (Signed)
 New Patient Office Visit  Subjective:  Patient ID: [name], female    DOB: Apr 28, 1946  Age: 78 y.o. MRN: [id number]  CC:  Chief Complaint  Patient presents with   Establish Care    New pt est care. Pt was recently diagnosed with type 2 diabetes but she was not aware. She states she has been in the prediabetic range. She also has some concerns about her big toe.    HPI [name] presents to establish care  Discussed the use of AI scribe software for clinical note transcription with the patient, who gave verbal consent to proceed.  History of Present Illness   [name] is a 78 year old female with diabetes and neuropathy who presents for transfer of care and medication management.  She has concerns about her diabetes management, as she was surprised to see a diabetes diagnosis in her chart. She has always been prediabetic and has never been on medication for it. Her A1c reached diabetic levels in December 2023, but she has been managing it with dietary changes and is not currently on any diabetes medication. Her current A1c is 6.4, indicating prediabetes.  She has a long-standing issue with the toe next to her big toe, which is longer and frequently develops ulcers due to friction with her shoes. This has been ongoing for a few years, and she is currently on her second bottle of antibiotics. X-rays have shown that the infection has not reached the bone. She is scheduled to see a surgeon on April 1st to discuss potential surgical options, as her podiatrist is not a Careers adviser.  She has neuropathy, diagnosed in her twenties, with no known cause as she was not diabetic at the time. She describes the sensation as 'heavy, like when something falls asleep' and uses a cane due to arthritis in her knees, which occasionally lock up. She takes Lyrica 100 mg three times a day, which helps but does not completely alleviate the symptoms.  She has a history of hypothyroidism and is on  levothyroxine 100 mcg, taking half a tablet on Monday, Wednesday, and Friday, and a whole tablet on the other days. Her thyroid levels were last checked in December and were normal.  She takes Protonix and Pepcid daily for acid reflux but reports experiencing a lot of gas recently. She is unsure if the medications are still effective as she does not experience heartburn. No current heartburn, but reports gas and fluid retention, especially in summer. She does have a script for bentyl, but was uncertain what it was for thus never takes.  She has a history of breast cancer, having undergone a lumpectomy two years ago, and is on anastrozole for five years. She follows up with her oncologist and has adjusted to the medication after initial side effects with other treatments.  She takes Lasix for fluid retention, which worsens in the summer, and adjusts her dose accordingly. She has a family history of heart disease, with her father having died of a heart attack at 64, but she has not had any heart attacks or stents herself. She quit smoking in 2014.   Pt reports a hx of vit D deficiency in the past. She is currently taking a calcium with Vit D supplement (1000IU) once daily, and has another Vit D3 (3,000IU) and states she takes three of these daily in addition to the calcium with D. She has not had her D levels checked in "a while."  Pt  takes numerous OTC medications including cinnamon, B12, Ca+D, Vit D3, probiotics,       Outpatient Encounter Medications as of 02/15/2024  Medication Sig   acetaminophen (TYLENOL) 650 MG CR tablet Take 650 mg by mouth every 8 (eight) hours as needed for pain.   anastrozole (ARIMIDEX) 1 MG tablet Take 1 tablet (1 mg total) by mouth daily.   aspirin 81 MG tablet Take 81 mg by mouth daily.   atorvastatin (LIPITOR) 40 MG tablet Take 1 tablet (40 mg total) by mouth daily for cholesterol.   Calcium Carbonate-Vit D-Min (CALCIUM 1200 PO) Take by mouth.   cyanocobalamin  (VITAMIN B12) 1000 MCG tablet Take 1,000 mcg by mouth daily.   dicyclomine (BENTYL) 20 MG tablet Take 1 tablet (20 mg total) by mouth 3 (three) times daily as needed for Nausea, Bloating, Cramping or Diarrhea.                              /   dorzolamide-timolol (COSOPT) 22.3-6.8 MG/ML ophthalmic solution Place 1 drop into both eyes 2 (two) times daily.   DULoxetine (CYMBALTA) 20 MG capsule Take 1 capsule (20 mg total) by mouth daily.   ezetimibe (ZETIA) 10 MG tablet Take 1 tablet (10 mg total) by mouth daily for cholesterol.   furosemide (LASIX) 40 MG tablet Take 1 tablet (40 mg total) by mouth 1-2 times daily for BP & Fluid Retention   levothyroxine (SYNTHROID) 100 MCG tablet Take 1 tablet (100 mcg total) by mouth daily before breakfast. Take 1/2 tablet (50 mcg) on Mon-Wed-Fri and take 1 tablet(100 mcg) other days   Multiple Vitamin (MULTIVITAMIN) tablet Take 1 tablet by mouth daily.   pregabalin (LYRICA) 100 MG capsule Take 1 capsule (100 mg total) by mouth 3 (three) times daily.   Probiotic Product (PROBIOTIC DAILY PO) Take 1 capsule by mouth daily.   [DISCONTINUED] cholecalciferol (VITAMIN D3) 25 MCG (1000 UNIT) tablet Take 3,000 Units by mouth daily.   [DISCONTINUED] famotidine (PEPCID) 40 MG tablet Take 1 tablet (40 mg total) by mouth daily.   [DISCONTINUED] pantoprazole (PROTONIX) 40 MG tablet Take 1 tablet (40 mg total) by mouth daily to prevent heartburn and indigestion   pantoprazole (PROTONIX) 40 MG tablet Take 1 tablet (40 mg total) by mouth daily to prevent heartburn and indigestion   [DISCONTINUED] diclofenac Sodium (VOLTAREN) 1 % GEL Apply 4 g topically 4 (four) times daily. (Patient not taking: Reported on 02/15/2024)   [DISCONTINUED] fluconazole (DIFLUCAN) 150 MG tablet Take 1 tablet (150 mg total) by mouth every 7 (seven) days for 4 doses. (Patient not taking: Reported on 02/15/2024)   [DISCONTINUED] gentamicin cream (GARAMYCIN) 0.1 % Apply 1 gram to each toe ulcer topically daily.  (Patient not taking: Reported on 02/15/2024)   No facility-administered encounter medications on file as of 02/15/2024.    Past Medical History:  Diagnosis Date   Allergy    Anemia    Arthritis    Cataract    Chest tightness    Chronic kidney disease    Diabetes mellitus without complication (HCC)    Fatigue    GERD (gastroesophageal reflux disease)    Glaucoma    Heart palpitations 12/06/2019   Hyperlipidemia    Hypothyroid    Internal hemorrhoids 05/04/2009   Qualifier: Diagnosis of  By: Nelson-Smith CMA (AAMA), Dottie     Melanoma [facility]) 2013   Excision/surgery only. No other treatment   Myalgia    Neuromuscular  disorder (HCC)    Neuropathy   Obesity    Pre-diabetes    Prediabetes    Ulcer    Varicose veins     Past Surgical History:  Procedure Laterality Date   ABDOMINAL HYSTERECTOMY     Total    BREAST LUMPECTOMY WITH RADIOACTIVE SEED LOCALIZATION Right 02/24/2022   Procedure: RIGHT BREAST LUMPECTOMY WITH RADIOACTIVE SEED LOCALIZATION;  Surgeon: Abigail Miyamoto, MD;  Location: [facility] OR;  Service: General;  Laterality: Right;   BREAST SURGERY     CATARACT EXTRACTION Bilateral    Dr. Darel Hong   CYST REMOVAL HAND Left 07/30/2018   EYE SURGERY     NASAL SINUS SURGERY     ROTATOR CUFF REPAIR Left more than 7 years ago   Dr. Eulah Pont and Dr. Loreta Ave   TONSILLECTOMY AND ADENOIDECTOMY      Family History  Problem Relation Age of Onset   Dementia Mother    Lung cancer Mother        dx 15s; smoking hx   Cancer Mother    Varicose Veins Mother    Coronary artery disease Father 59   Heart disease Father    Lung cancer Maternal Grandmother        non-smoker; dx 69s   Cancer Maternal Grandmother    Dementia Maternal Aunt    Breast cancer Cousin        maternal female cousin; dx 55s    Social History   Socioeconomic History   Marital status: Widowed    Spouse name: Not on file   Number of children: 1   Years of education: Not on file   Highest education level:  Some college, no degree  Occupational History   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  Widow.     Social Drivers of Corporate investment banker Strain: Low Risk  (02/12/2024)   Overall Financial Resource Strain (CARDIA)    Difficulty of Paying Living Expenses: Not very hard  Food Insecurity: No Food Insecurity (02/12/2024)   Hunger Vital Sign    Worried About Running Out of Food in the Last Year: Never true    Ran Out of Food in the Last Year: Never true  Transportation Needs: No Transportation Needs (02/12/2024)   PRAPARE - Administrator, Civil Service (Medical): No    Lack of Transportation (Non-Medical): No  Physical Activity: Unknown (02/12/2024)   Exercise Vital Sign    Days of Exercise per Week: 0 days    Minutes of Exercise per Session: Not on file  Stress: No Stress Concern Present (02/12/2024)   [employer] of Occupational Health - Occupational Stress Questionnaire    Feeling of Stress : Only a little  Social Connections: Moderately Isolated (02/12/2024)   Social Connection and Isolation Panel [NHANES]    Frequency of Communication with Friends and Family: More than three  times a week    Frequency of Social Gatherings with Friends and Family: Once a week    Attends Religious Services: 1 to 4 times per year    Active Member of [employer] or Organizations: No    Attends Banker Meetings: Not on file    Marital Status: Widowed  Catering manager  Violence: Not on file    ROS: as noted in HPI  Objective:  BP 123/68   Pulse (!) 53   Ht 5\' 6"  (1.676 m)   Wt 218 lb 12.8 oz (99.2 kg)   SpO2 97%   BMI 35.32 kg/m   Physical Exam Vitals and nursing note reviewed.  Constitutional:      General: She is not in acute distress.    Appearance: Normal appearance. She is obese. She is not ill-appearing, toxic-appearing or diaphoretic.  HENT:     Head: Normocephalic and atraumatic.     Right Ear: Tympanic membrane, ear canal and external ear normal. There is no impacted cerumen.     Left Ear: Tympanic membrane, ear canal and external ear normal. There is no impacted cerumen.     Nose: Nose normal.     Mouth/Throat:     Mouth: Mucous membranes are moist.     Pharynx: Oropharynx is clear. No oropharyngeal exudate or posterior oropharyngeal erythema.  Eyes:     General: No scleral icterus.       Right eye: No discharge.        Left eye: No discharge.     Extraocular Movements: Extraocular movements intact.     Pupils: Pupils are equal, round, and reactive to light.  Neck:     Thyroid: No thyroid mass, thyromegaly or thyroid tenderness.  Cardiovascular:     Rate and Rhythm: Normal rate and regular rhythm.  Pulmonary:     Effort: Pulmonary effort is normal. No respiratory distress.     Breath sounds: Normal breath sounds. No stridor. No wheezing or rhonchi.  Abdominal:     General: Abdomen is flat. Bowel sounds are normal.     Palpations: Abdomen is soft.  Musculoskeletal:     Cervical back: Normal range of motion and neck supple. No rigidity or tenderness.     Right lower leg: Edema present.     Left lower leg: Edema present.  Lymphadenopathy:     Cervical: No cervical adenopathy.  Skin:    General: Skin is warm and dry.     Coloration: Skin is not jaundiced.     Findings: No bruising.  Neurological:     General: No focal deficit present.     Mental Status: She is alert and oriented to person, place, and time.     Sensory:  Sensory deficit (neuropathy B feet) present.     Motor: No weakness.     Gait: Gait abnormal (uses cane).  Psychiatric:        Mood and Affect: Mood normal.        Behavior: Behavior normal.     Last CBC Lab Results  Component Value Date   WBC 10.0 11/09/2023   HGB 13.9 11/09/2023   HCT 42.3 11/09/2023   MCV 87.4 11/09/2023   MCH 28.7 11/09/2023   RDW 13.6 11/09/2023   PLT 251 11/09/2023   Last metabolic panel Lab Results  Component Value Date   GLUCOSE 91 11/09/2023   NA 144 11/09/2023   K 4.1 11/09/2023   CL 105 11/09/2023   CO2 30 11/09/2023  BUN 20 11/09/2023   CREATININE 1.03 (H) 11/09/2023   EGFR 57 (L) 11/09/2023   CALCIUM 9.3 11/09/2023   PROT 6.9 11/09/2023   ALBUMIN 4.2 02/02/2022   BILITOT 0.9 11/09/2023   ALKPHOS 97 02/02/2022   AST 17 11/09/2023   ALT 16 11/09/2023   ANIONGAP 6 02/02/2022   Last lipids Lab Results  Component Value Date   CHOL 122 09/04/2023   HDL 45 (L) 09/04/2023   LDLCALC 47 09/04/2023   TRIG 237 (H) 09/04/2023   CHOLHDL 2.7 09/04/2023   Last hemoglobin A1c Lab Results  Component Value Date   HGBA1C 6.4 (H) 09/04/2023   Last thyroid functions Lab Results  Component Value Date   TSH 1.37 11/09/2023   Last vitamin D Lab Results  Component Value Date   VD25OH 77 11/09/2023   Last vitamin B12 and Folate Lab Results  Component Value Date   VITAMINB12 >2,000 (H) 10/12/2020      Assessment & Plan:  Peripheral autonomic neuropathy of unknown cause -     DULoxetine HCl; Take 1 capsule (20 mg total) by mouth daily.  Dispense: 90 capsule; Refill: 3 -     B12 and Folate Panel  Hypothyroidism, unspecified type -     TSH  Gastroesophageal reflux disease with esophagitis without hemorrhage -     Pantoprazole Sodium; Take 1 tablet (40 mg total) by mouth daily to prevent heartburn and indigestion  Dispense: 90 tablet; Refill: 1  CKD stage 3 secondary to diabetes (HCC) -     CBC with Differential/Platelet -      Comprehensive metabolic panel with GFR  Primary hypertension  Glaucoma of both eyes, unspecified glaucoma type  Mixed hyperlipidemia -     Hemoglobin A1c -     Lipid panel  Prediabetes -     CBC with Differential/Platelet -     Hemoglobin A1c -     Comprehensive metabolic panel with GFR  Malignant neoplasm of upper-outer quadrant of right breast in female, estrogen receptor positive (HCC)  Vitamin D overdose, accidental or unintentional, initial encounter -     VITAMIN D 25 Hydroxy (Vit-D Deficiency, Fractures)  Encounter for monitoring long-term proton pump inhibitor therapy -     B12 and Folate Panel  Peripheral edema  Ulcer of toe of left foot, unspecified ulcer stage (HCC)  Assessment and Plan    Left foot ulcer Recurrent ulcer on left toe due to friction. No osteomyelitis on x-ray. Surgical consultation scheduled to consider toe shaving to prevent further ulcers. - Continue antibiotics as prescribed. - Consult with surgeon on April 1 for potential surgical intervention.  Peripheral neuropathy Long-standing neuropathy with heaviness and numbness. Lyrica alleviates symptoms. Discussed adding Cymbalta for enhanced pain control. - Prescribe low-dose Cymbalta to be taken with Lyrica.  Prediabetes A1c 6.4 in October 2024. Managed with dietary changes. Emphasized regular screenings for complications. - Order A1c test. - Schedule annual diabetic eye exam. - Continue dietary management.  Hypothyroidism Long-standing hypothyroidism on levothyroxine. Thyroid function normal in December 2024. Monitoring TSH levels. - Order TSH test. - Refill levothyroxine after TSH results.  Gastroesophageal reflux disease (GERD) Increased gas despite pantoprazole and famotidine. Explained medications prevent heartburn but not gas. Suggested dietary changes. - Discontinue famotidine. - Advise dietary changes to reduce gas, such as chewing food thoroughly and drinking water with  meals -consider the use of PRN bentyl as needed for gas.  Breast cancer (post-lumpectomy) Post-lumpectomy, on anastrozole. Tolerating well with no significant side effects. Continues  follow-up with oncologist. - Continue anastrozole as prescribed. - Continue follow-up with oncologist.  Coronary artery spasms Coronary artery spasms without myocardial infarction or stent. Annual cardiologist follow-up. No new symptoms. - Continue annual follow-up with cardiologist.  General Health Maintenance Due for wellness visit and routine screenings. Discussed importance of monitoring vitamin D and B12 levels. - Schedule wellness visit in 3 months. - Order vitamin D and B12 tests.  Follow-up Transitioning care requires follow-up for conditions and medication management. Discussed need for regular follow-up. - Schedule follow-up in 3-4 months. - Refill medications as needed after lab results.      Total time spent face to face, chart review, personal interpretation of outside labs and counseling patient was 48 minutes.  Return in about 3 months (around 05/17/2024).   Maretta Bees, PA

## 2024-02-16 ENCOUNTER — Encounter: Payer: Self-pay | Admitting: Urgent Care

## 2024-02-16 ENCOUNTER — Ambulatory Visit: Payer: PPO | Admitting: Nurse Practitioner

## 2024-02-16 LAB — HEMOGLOBIN A1C: Hgb A1c MFr Bld: 6.3 % (ref 4.6–6.5)

## 2024-02-16 LAB — LIPID PANEL
Cholesterol: 119 mg/dL (ref 0–200)
HDL: 46.7 mg/dL (ref >39.00)
LDL Cholesterol: 44 mg/dL (ref 0–99)
NonHDL: 72.44
Total CHOL/HDL Ratio: 3
Triglycerides: 143 mg/dL (ref 0.0–149.0)
VLDL: 28.6 mg/dL (ref 0.0–40.0)

## 2024-02-16 LAB — COMPREHENSIVE METABOLIC PANEL WITH GFR
ALT: 21 U/L (ref 0–35)
AST: 22 U/L (ref 0–37)
Albumin: 4.1 g/dL (ref 3.5–5.2)
Alkaline Phosphatase: 110 U/L (ref 39–117)
BUN: 17 mg/dL (ref 6–23)
CO2: 31 meq/L (ref 19–32)
Calcium: 9.2 mg/dL (ref 8.4–10.5)
Chloride: 105 meq/L (ref 96–112)
Creatinine, Ser: 1.05 mg/dL (ref 0.40–1.20)
GFR: 51.77 mL/min — ABNORMAL LOW (ref >60.00)
Glucose, Bld: 84 mg/dL (ref 70–99)
Potassium: 4.4 meq/L (ref 3.5–5.1)
Sodium: 144 meq/L (ref 135–145)
Total Bilirubin: 0.7 mg/dL (ref 0.2–1.2)
Total Protein: 6.6 g/dL (ref 6.0–8.3)

## 2024-02-16 LAB — CBC WITH DIFFERENTIAL/PLATELET
Basophils Absolute: 0.1 10*3/uL (ref 0.0–0.1)
Basophils Relative: 1.1 % (ref 0.0–3.0)
Eosinophils Absolute: 0.2 10*3/uL (ref 0.0–0.7)
Eosinophils Relative: 1.6 % (ref 0.0–5.0)
HCT: 43.8 % (ref 36.0–46.0)
Hemoglobin: 14.2 g/dL (ref 12.0–15.0)
Lymphocytes Relative: 24.6 % (ref 12.0–46.0)
Lymphs Abs: 2.8 10*3/uL (ref 0.7–4.0)
MCHC: 32.4 g/dL (ref 30.0–36.0)
MCV: 88.6 fl (ref 78.0–100.0)
Monocytes Absolute: 0.7 10*3/uL (ref 0.1–1.0)
Monocytes Relative: 6 % (ref 3.0–12.0)
Neutro Abs: 7.7 10*3/uL (ref 1.4–7.7)
Neutrophils Relative %: 66.7 % (ref 43.0–77.0)
Platelets: 253 10*3/uL (ref 150.0–400.0)
RBC: 4.94 Mil/uL (ref 3.87–5.11)
RDW: 15.4 % (ref 11.5–15.5)
WBC: 11.5 10*3/uL — ABNORMAL HIGH (ref 4.0–10.5)

## 2024-02-16 LAB — B12 AND FOLATE PANEL
Folate: >25.2 ng/mL (ref >5.9)
Vitamin B-12: 1041 pg/mL — ABNORMAL HIGH (ref 211–911)

## 2024-02-16 LAB — VITAMIN D 25 HYDROXY (VIT D DEFICIENCY, FRACTURES): VITD: 90.17 ng/mL (ref 30.00–100.00)

## 2024-02-16 LAB — TSH: TSH: 0.53 u[IU]/mL (ref 0.35–5.50)

## 2024-02-16 MED ORDER — LEVOTHYROXINE SODIUM 100 MCG PO TABS
100.0000 ug | ORAL_TABLET | Freq: Every day | ORAL | 1 refills | Status: DC
  Filled 2024-02-16 – 2024-04-16 (×2): qty 90, 90d supply, fill #0

## 2024-02-17 ENCOUNTER — Other Ambulatory Visit (HOSPITAL_BASED_OUTPATIENT_CLINIC_OR_DEPARTMENT_OTHER): Payer: Self-pay

## 2024-02-17 ENCOUNTER — Other Ambulatory Visit (HOSPITAL_COMMUNITY): Payer: Self-pay

## 2024-02-19 ENCOUNTER — Other Ambulatory Visit (HOSPITAL_COMMUNITY): Payer: Self-pay

## 2024-02-20 ENCOUNTER — Ambulatory Visit (INDEPENDENT_AMBULATORY_CARE_PROVIDER_SITE_OTHER)

## 2024-02-20 ENCOUNTER — Encounter: Payer: Self-pay | Admitting: Podiatry

## 2024-02-20 ENCOUNTER — Ambulatory Visit: Admitting: Podiatry

## 2024-02-20 VITALS — Ht 66.0 in | Wt 218.8 lb

## 2024-02-20 DIAGNOSIS — L97521 Non-pressure chronic ulcer of other part of left foot limited to breakdown of skin: Secondary | ICD-10-CM

## 2024-02-20 DIAGNOSIS — M2042 Other hammer toe(s) (acquired), left foot: Secondary | ICD-10-CM

## 2024-02-20 NOTE — Progress Notes (Unsigned)
 Chief Complaint  Patient presents with   Diabetic Ulcer    Pt is here due to ulcer on left second toe, states she has ongoing issues with the toe for years.   HPI: 78 y.o. female presenting today for c/o hammertoe to the left second toe.  She was referred to me by Dr. Donzetta Matters for possible surgery consult.  She had underwent percutaneous flexor tenotomy's in the past and these resolved her third toe ulcerations.  She healed very well from these procedures.  The patient is open to proceeding with surgical intervention to correct the contracture of the left second toe which caused in her recurring distal toe ulceration.   Past Medical History:  Diagnosis Date   Allergy    Anemia    Arthritis    Cataract    Chest tightness    Chronic kidney disease    Diabetes mellitus without complication (HCC)    Fatigue    GERD (gastroesophageal reflux disease)    Glaucoma    Heart palpitations 12/06/2019   Hyperlipidemia    Hypothyroid    Internal hemorrhoids 05/04/2009   Qualifier: Diagnosis of  By: Nelson-Smith CMA (AAMA), Dottie     Melanoma [facility]) 2013   Excision/surgery only. No other treatment   Myalgia    Neuromuscular disorder (HCC)    Neuropathy   Obesity    Pre-diabetes    Prediabetes    Ulcer    Varicose veins     Past Surgical History:  Procedure Laterality Date   ABDOMINAL HYSTERECTOMY     Total    BREAST LUMPECTOMY WITH RADIOACTIVE SEED LOCALIZATION Right 02/24/2022   Procedure: RIGHT BREAST LUMPECTOMY WITH RADIOACTIVE SEED LOCALIZATION;  Surgeon: Abigail Miyamoto, MD;  Location: [facility] OR;  Service: General;  Laterality: Right;   BREAST SURGERY     CATARACT EXTRACTION Bilateral    Dr. Darel Hong   CYST REMOVAL HAND Left 07/30/2018   EYE SURGERY     NASAL SINUS SURGERY     ROTATOR CUFF REPAIR Left more than 7 years ago   Dr. Eulah Pont and Dr. Loreta Ave   TONSILLECTOMY AND ADENOIDECTOMY      Allergies  Allergen Reactions   Procaine Hcl Shortness Of Breath    Ok to use  sq lidocaine for iv starts   Ciprofloxacin     Unknown reaction   Macrobid [Nitrofurantoin Macrocrystal]     Unknown reaction   Mupirocin Dermatitis    Patient states toe turned red after applying medication.   Sulfonamide Derivatives Hives   Tetanus Toxoid Swelling    REACTION: arm swells   Tuberculin Ppd Swelling   Latex Rash   Penicillins Rash    PHYSICAL EXAM: General: The patient is alert and oriented x3 in no acute distress.  Dermatology: Skin is warm, dry and supple bilateral lower extremities. Interspaces are clear of maceration and debris.  No rashes noted.  There is a superficial ulceration on the distal tip of the left second toe measuring approximately 3 mm x 4 mm which is partial-thickness with granular base.  No active drainage is noted.  No significant hyperkeratosis is seen.  No areas of necrosis is noted.  No clinical signs of infection are noted to the second toe  Vascular: Palpable pedal pulses bilaterally. Capillary refill within normal limits.   No erythema or calor.  Musculoskeletal Exam:  There is a dorsal contracture of the left second toe which is manually reducible.  RADIOGRAPHIC EXAM:  No fracture noted.  There is dorsal contracture of the second toe at the PIPJ.  There is medial angulation of the 4th and 5th toes at the DIPJ level.  There is joint space narrowing at the first MPJ.  Inferior calcaneal spur noted.  Dorsal spurring seen at the navicular-cuneiform joints.  No evidence of erosive findings at the distal portion of the second toe.  ASSESSMENT/PLAN OF CARE: 1. Skin ulcer of third toe of left foot, limited to breakdown of skin (HCC)   2. Hammertoe of left foot    Discussed patient's condition and possible etiologies today.  Discussed conservative treatment options with patient today, including shoe modification / arch supports, aperture pads, cortisone injection, NSAID topical / oral therapy, and toe splints and shields.  Discussed surgical  intervention if conservative options are not successful.    The patient would like to proceed with a percutaneous flexor tenotomy of the left second hammertoe in an attempt to resolve her distal toe ulcer.  Patient will continue with her antibiotic ointment and dressing to the tip of the toe until this is healed.  If the ulceration is still present near the time of procedure, request that she notify the office so that we can start her on oral antibiotics for prophylactic measures to prevent a postop infection  Benefits, risk, and possible postoperative complications were discussed with the patient today.  Also discussed possible sequela if she does not proceed with any surgical intervention.  Verbal and written consent were obtained today.  Follow-up for office surgery for left second toe percutaneous flexor tenotomy in the procedure room.  Clerance Lav, DPM, FACFAS [facility]     [address], [place] [address]

## 2024-02-22 ENCOUNTER — Telehealth: Payer: Self-pay | Admitting: Podiatry

## 2024-02-22 NOTE — Telephone Encounter (Signed)
 DOS: 03/18/24  TENOTOMY SINGLE L2 [phone])  EFFECTIVE DATE: 06/21/2017  CO INSURANCE : 30%       PER [employer] M OF HEALTH TE[employer] ADVANTAGE NO PRIOR AUTH IS REQ FOR CPT CODE 60454  UJW#[id number]

## 2024-02-23 ENCOUNTER — Encounter: Payer: Self-pay | Admitting: Podiatry

## 2024-02-27 DIAGNOSIS — Z853 Personal history of malignant neoplasm of breast: Secondary | ICD-10-CM | POA: Diagnosis not present

## 2024-02-27 DIAGNOSIS — Z08 Encounter for follow-up examination after completed treatment for malignant neoplasm: Secondary | ICD-10-CM | POA: Diagnosis not present

## 2024-02-29 ENCOUNTER — Encounter: Payer: Self-pay | Admitting: Adult Health

## 2024-03-01 ENCOUNTER — Other Ambulatory Visit: Payer: Self-pay | Admitting: Internal Medicine

## 2024-03-09 ENCOUNTER — Other Ambulatory Visit (HOSPITAL_COMMUNITY): Payer: Self-pay

## 2024-03-14 ENCOUNTER — Other Ambulatory Visit (HOSPITAL_COMMUNITY): Payer: Self-pay

## 2024-03-14 ENCOUNTER — Other Ambulatory Visit: Payer: Self-pay | Admitting: Internal Medicine

## 2024-03-14 ENCOUNTER — Other Ambulatory Visit: Payer: Self-pay

## 2024-03-18 ENCOUNTER — Ambulatory Visit: Admitting: Podiatry

## 2024-03-18 DIAGNOSIS — M2042 Other hammer toe(s) (acquired), left foot: Secondary | ICD-10-CM | POA: Diagnosis not present

## 2024-03-18 NOTE — Progress Notes (Signed)
 HPI: 78 y.o. female presents today for a percutaneous flexor tenotomy of the left second toe.  She underwent these procedures on the bilateral third toes and did not have any recurring corns and was very pleased.  She started getting an ulceration on the distal tip of the left second toe and was referred to me for surgical evaluation by Dr. Johnette Naegeli.  Consent was reviewed and signed on her last visit.  Past Medical History:  Diagnosis Date   Allergy    Anemia    Arthritis    Cataract    Chest tightness    Chronic kidney disease    Diabetes mellitus without complication (HCC)    Fatigue    GERD (gastroesophageal reflux disease)    Glaucoma    Heart palpitations 12/06/2019   Hyperlipidemia    Hypothyroid    Internal hemorrhoids 05/04/2009   Qualifier: Diagnosis of  By: Nelson-Smith CMA (AAMA), Dottie     Melanoma [facility]) 2013   Excision/surgery only. No other treatment   Myalgia    Neuromuscular disorder (HCC)    Neuropathy   Obesity    Pre-diabetes    Prediabetes    Ulcer    Varicose veins     Past Surgical History:  Procedure Laterality Date   ABDOMINAL HYSTERECTOMY     Total    BREAST LUMPECTOMY WITH RADIOACTIVE SEED LOCALIZATION Right 02/24/2022   Procedure: RIGHT BREAST LUMPECTOMY WITH RADIOACTIVE SEED LOCALIZATION;  Surgeon: Oza Blumenthal, MD;  Location: [facility] OR;  Service: General;  Laterality: Right;   BREAST SURGERY     CATARACT EXTRACTION Bilateral    Dr. Demetrios Finders   CYST REMOVAL HAND Left 07/30/2018   EYE SURGERY     NASAL SINUS SURGERY     ROTATOR CUFF REPAIR Left more than 7 years ago   Dr. Abigail Abler and Dr. Mabel Savage   TONSILLECTOMY AND ADENOIDECTOMY      Allergies  Allergen Reactions   Procaine Hcl Shortness Of Breath    Ok to use sq lidocaine  for iv starts   Ciprofloxacin      Unknown reaction   Macrobid  [Nitrofurantoin  Macrocrystal]     Unknown reaction   Mupirocin  Dermatitis    Patient states toe turned red after applying medication.    Sulfonamide Derivatives Hives   Tetanus Toxoid Swelling    REACTION: arm swells   Tuberculin Ppd Swelling   Latex Rash   Penicillins Rash     Physical Exam: There is a flexible contracture of the left second toe at the PIP joint.  There is a hyperkeratotic lesion on the distal tip of the toe with pain on palpation.  There are no clinical signs of infection today.  Assessment/Plan of Care: 1. Hammertoe of left foot    The written consent is on file and was reviewed today confirming planned percutaneous flexor tenotomy of the left second toe.  The left second toe was anesthetized with a 50-50 mixture of 1% lidocaine  plain and 0.5% Marcaine  plain for a total of 3 cc administered to the toe for local anesthesia.  Following this, a sterile skin prep was performed to the forefoot.  Proper draping then followed.  Following confirmation of anesthetic to the toe, a sterile #15 blade was utilized to make a stab incision on the plantar aspect of the left second toe at the level of the PIPJ.  The blade was carried to the level of the plantar joint capsule and turned horizontally with the blade angulated towards the  skin while the toe was stretched in extension to allow the blade to cut through the flexor tendon.  There was immediate release of the contracture of the toe.  Following confirmation of adequate reduction of the hammertoe deformity, the skin incision was closed with the use of 4-0 nylon.  A dry sterile skin dressing was applied to the second toe.    Patient will leave this dressing on for 1 to 2 days, then may remove to shower.  She will need to dry the area well and then replace with a small amount of antibiotic ointment and a Band-Aid.  She will change this daily and follow-up in 1 week for suture removal.  She will elevate and ice the area.  She was fitted for a surgical shoe to wear.  Patient left the procedure room for home in satisfactory condition  Jerral Mccauley D. Iysis Germain, DPM, FACFAS [facility]     [address] [place], [place] [address]                Office [phone]  Fax [phone]

## 2024-03-20 ENCOUNTER — Other Ambulatory Visit (HOSPITAL_COMMUNITY): Payer: Self-pay

## 2024-03-20 ENCOUNTER — Other Ambulatory Visit: Payer: Self-pay

## 2024-03-20 ENCOUNTER — Other Ambulatory Visit: Payer: Self-pay | Admitting: Urgent Care

## 2024-03-20 MED ORDER — ATORVASTATIN CALCIUM 40 MG PO TABS
40.0000 mg | ORAL_TABLET | Freq: Every day | ORAL | 0 refills | Status: DC
Start: 2024-03-20 — End: 2024-06-25
  Filled 2024-03-20: qty 90, 90d supply, fill #0

## 2024-03-20 NOTE — Telephone Encounter (Signed)
 Copied from CRM [id number]. Topic: Clinical - Medication Refill >> Mar 20, 2024 12:38 PM Orien Bird wrote: Most Recent Primary Care Visit:  Provider: CRAIN, WHITNEY L  Department: [facility]  Visit Type: NEW PT - OFFICE VISIT  Date: 02/15/2024  Medication: atorvastatin  (LIPITOR) 40 MG tablet  Has the patient contacted their pharmacy? Yes (Agent: If no, request that the patient contact the pharmacy for the refill. If patient does not wish to contact the pharmacy document the reason why and proceed with request.) (Agent: If yes, when and what did the pharmacy advise?)  Is this the correct pharmacy for this prescription? Yes If no, delete pharmacy and type the correct one.  This is the patient's preferred pharmacy:  [facility] [address] [place] [address] Phone: [phone] Fax: [phone]   Has the prescription been filled recently? No  Is the patient out of the medication? Yes  Has the patient been seen for an appointment in the last year OR does the patient have an upcoming appointment? Yes  Can we respond through MyChart? No  Agent: Please be advised that Rx refills may take up to 3 business days. We ask that you follow-up with your pharmacy.

## 2024-03-22 ENCOUNTER — Other Ambulatory Visit: Payer: Self-pay | Admitting: Hematology and Oncology

## 2024-03-22 ENCOUNTER — Other Ambulatory Visit: Payer: Self-pay

## 2024-03-22 ENCOUNTER — Other Ambulatory Visit (HOSPITAL_COMMUNITY): Payer: Self-pay

## 2024-03-22 MED ORDER — ANASTROZOLE 1 MG PO TABS
1.0000 mg | ORAL_TABLET | Freq: Every day | ORAL | 3 refills | Status: AC
  Filled 2024-03-22: qty 90, 90d supply, fill #0
  Filled 2024-06-20: qty 90, 90d supply, fill #1
  Filled 2024-09-19: qty 90, 90d supply, fill #2
  Filled 2024-12-12: qty 90, 90d supply, fill #3

## 2024-03-25 ENCOUNTER — Ambulatory Visit (INDEPENDENT_AMBULATORY_CARE_PROVIDER_SITE_OTHER): Admitting: Podiatry

## 2024-03-25 ENCOUNTER — Other Ambulatory Visit (HOSPITAL_COMMUNITY): Payer: Self-pay

## 2024-03-25 DIAGNOSIS — Z9889 Other specified postprocedural states: Secondary | ICD-10-CM

## 2024-03-25 NOTE — Progress Notes (Unsigned)
 Subjective:  Patient ID: [name], female    DOB: January 14, 1946,  MRN: [id number]  [name] presents to clinic today for:  Chief Complaint  Patient presents with   Post-op Problem    Post op flexor tenotomy. No pain.   Patient is 1 week postop after undergoing a left second toe percutaneous flexor tenotomy.  Patient denies fever, chills, night sweats, nausea/vomiting.  Patient denies chest pain or shortness of breath.  Patient denies calf pain.  Past Medical History:  Diagnosis Date   Allergy    Anemia    Arthritis    Cataract    Chest tightness    Chronic kidney disease    Diabetes mellitus without complication (HCC)    Fatigue    GERD (gastroesophageal reflux disease)    Glaucoma    Heart palpitations 12/06/2019   Hyperlipidemia    Hypothyroid    Internal hemorrhoids 05/04/2009   Qualifier: Diagnosis of  By: Nelson-Smith CMA (AAMA), Dottie     Melanoma [facility]) 2013   Excision/surgery only. No other treatment   Myalgia    Neuromuscular disorder (HCC)    Neuropathy   Obesity    Pre-diabetes    Prediabetes    Ulcer    Varicose veins     Past Surgical History:  Procedure Laterality Date   ABDOMINAL HYSTERECTOMY     Total    BREAST LUMPECTOMY WITH RADIOACTIVE SEED LOCALIZATION Right 02/24/2022   Procedure: RIGHT BREAST LUMPECTOMY WITH RADIOACTIVE SEED LOCALIZATION;  Surgeon: Oza Blumenthal, MD;  Location: [facility] OR;  Service: General;  Laterality: Right;   BREAST SURGERY     CATARACT EXTRACTION Bilateral    Dr. Demetrios Finders   CYST REMOVAL HAND Left 07/30/2018   EYE SURGERY     NASAL SINUS SURGERY     ROTATOR CUFF REPAIR Left more than 7 years ago   Dr. Abigail Abler and Dr. Mabel Savage   TONSILLECTOMY AND ADENOIDECTOMY      Allergies  Allergen Reactions   Procaine Hcl Shortness Of Breath    Ok to use sq lidocaine  for iv starts   Ciprofloxacin      Unknown reaction   Macrobid  [Nitrofurantoin  Macrocrystal]     Unknown reaction   Mupirocin  Dermatitis    Patient  states toe turned red after applying medication.   Sulfonamide Derivatives Hives   Tetanus Toxoid Swelling    REACTION: arm swells   Tuberculin Ppd Swelling   Latex Rash   Penicillins Rash    Objective:  Physical Examination: There are palpable pedal pulses.  There is minimal to no localized edema to the surgical area.  The incision is well-approximated and the sutures are holding well.  No active drainage is noted.  No clinical signs of infection are seen.     Latest Ref Rng & Units 02/15/2024    2:19 PM 09/04/2023    2:59 PM 05/30/2023    2:19 PM  Hemoglobin A1C  Hemoglobin-A1c 4.6 - 6.5 % 6.3  6.4  6.2    Assessment/Plan: 1. Post-operative state     The sutures were removed from the plantar aspect of the left second toe at the PIP joint.  Antibiotic ointment and a Band-Aid were applied.  She will continue to changes each day for the next 2 days then can discontinue all postop instructions and follow-up with me as needed.  We will cancel her next postoperative appointment and just keep her on the books with Dr. Johnette Naegeli for diabetic footcare.  Joe Murders, DPM, FACFAS [facility]     [address] [place], [place] [address]                Office [phone]  Fax [phone]

## 2024-03-27 ENCOUNTER — Encounter (HOSPITAL_COMMUNITY): Payer: Self-pay

## 2024-03-27 LAB — OPHTHALMOLOGY REPORT-SCANNED

## 2024-03-27 LAB — HM DIABETES EYE EXAM

## 2024-04-01 ENCOUNTER — Inpatient Hospital Stay: Payer: PPO | Admitting: Hematology and Oncology

## 2024-04-01 ENCOUNTER — Telehealth: Payer: Self-pay | Admitting: Hematology and Oncology

## 2024-04-01 NOTE — Assessment & Plan Note (Deleted)
 02/24/2022: Right lumpectomy: Grade 2 IDC 8 mm (2 contiguous blocks), intermediate grade DCIS, margins negative, ER 100%, PR 100%, HER2 negative, Ki-67 10%    Treatment plan: 1.  Given the small size and good prognostic profile, we are not doing Oncotype DX. 2. patient chose not to do adjuvant radiation 3. Adjuvant antiestrogen therapy: Anastrozole  started 03/15/2022   Anastrozole  toxicities: Leg swelling: She has had this problem before but it continues to be an issue in the summer Denies any hot flashes or arthralgias or myalgias   Breast cancer surveillance: Breast exam 04/01/2024: Benign Mammogram 02/27/2024 at [facility]: Benign breast density category A   Return to clinic in 1 year for follow-up

## 2024-04-01 NOTE — Telephone Encounter (Signed)
 Confirmed with pt about rescheduled appt date and time

## 2024-04-02 ENCOUNTER — Other Ambulatory Visit (HOSPITAL_COMMUNITY): Payer: Self-pay

## 2024-04-02 ENCOUNTER — Other Ambulatory Visit: Payer: Self-pay

## 2024-04-03 ENCOUNTER — Other Ambulatory Visit (HOSPITAL_COMMUNITY): Payer: Self-pay

## 2024-04-03 ENCOUNTER — Ambulatory Visit

## 2024-04-03 ENCOUNTER — Other Ambulatory Visit: Payer: Self-pay | Admitting: Family

## 2024-04-04 ENCOUNTER — Other Ambulatory Visit (HOSPITAL_COMMUNITY): Payer: Self-pay

## 2024-04-04 ENCOUNTER — Other Ambulatory Visit: Payer: Self-pay

## 2024-04-04 MED ORDER — EZETIMIBE 10 MG PO TABS
10.0000 mg | ORAL_TABLET | Freq: Every day | ORAL | 3 refills | Status: DC
  Filled 2024-04-04: qty 90, 90d supply, fill #0

## 2024-04-08 ENCOUNTER — Other Ambulatory Visit: Payer: Self-pay

## 2024-04-09 ENCOUNTER — Encounter: Admitting: Podiatry

## 2024-04-11 ENCOUNTER — Other Ambulatory Visit: Payer: Self-pay

## 2024-04-16 ENCOUNTER — Encounter: Admitting: Podiatry

## 2024-04-16 ENCOUNTER — Other Ambulatory Visit (HOSPITAL_COMMUNITY): Payer: Self-pay

## 2024-04-17 ENCOUNTER — Other Ambulatory Visit (HOSPITAL_COMMUNITY): Payer: Self-pay

## 2024-04-19 ENCOUNTER — Other Ambulatory Visit (HOSPITAL_COMMUNITY): Payer: Self-pay

## 2024-04-19 MED ORDER — DORZOLAMIDE HCL-TIMOLOL MAL 2-0.5 % OP SOLN
1.0000 [drp] | Freq: Two times a day (BID) | OPHTHALMIC | 3 refills | Status: DC
  Filled 2024-04-19: qty 10, 100d supply, fill #0

## 2024-04-22 ENCOUNTER — Inpatient Hospital Stay: Attending: Hematology and Oncology | Admitting: Hematology and Oncology

## 2024-04-22 VITALS — BP 115/51 | HR 70 | Temp 98.6°F | Resp 18 | Ht 66.0 in | Wt 225.3 lb

## 2024-04-22 DIAGNOSIS — M7989 Other specified soft tissue disorders: Secondary | ICD-10-CM | POA: Diagnosis not present

## 2024-04-22 DIAGNOSIS — Z79811 Long term (current) use of aromatase inhibitors: Secondary | ICD-10-CM | POA: Insufficient documentation

## 2024-04-22 DIAGNOSIS — Z1721 Progesterone receptor positive status: Secondary | ICD-10-CM | POA: Insufficient documentation

## 2024-04-22 DIAGNOSIS — Z1732 Human epidermal growth factor receptor 2 negative status: Secondary | ICD-10-CM | POA: Insufficient documentation

## 2024-04-22 DIAGNOSIS — C50411 Malignant neoplasm of upper-outer quadrant of right female breast: Secondary | ICD-10-CM | POA: Insufficient documentation

## 2024-04-22 DIAGNOSIS — Z170 Estrogen receptor positive status [ER+]: Secondary | ICD-10-CM | POA: Diagnosis not present

## 2024-04-22 DIAGNOSIS — Z780 Asymptomatic menopausal state: Secondary | ICD-10-CM | POA: Diagnosis not present

## 2024-04-22 NOTE — Progress Notes (Signed)
 Patient Care Team: Mandy Second, [place] as PCP - General (Physician Assistant) Sheryle Donning, MD as PCP - Cardiology (Cardiology) Nelva Bang, OD as Referring Physician (Optometry) Devorah Fonder as Referring Physician (Physician Assistant) Oza Blumenthal, MD as Consulting Physician (General Surgery) Cameron Cea, MD as Consulting Physician (Hematology and Oncology) Colie Dawes, MD as Attending Physician (Radiation Oncology) Merryl Abraham, [facility] (Inactive) as Pharmacist (Pharmacist)  DIAGNOSIS:  Encounter Diagnosis  Name Primary?   Malignant neoplasm of upper-outer quadrant of right breast in female, estrogen receptor positive (HCC) Yes    SUMMARY OF ONCOLOGIC HISTORY: Oncology History  Malignant neoplasm of upper-outer quadrant of right breast in female, estrogen receptor positive (HCC)  02/01/2022 Initial Diagnosis   Screening mammogram detected 0.6 cm right breast mass.  By ultrasound it measured 0.4 cm.  Stereotactic biopsy revealed grade 2 IDC and intermediate grade DCIS ER 100%, PR 100%, HER2 negative, Ki-67 10%   02/02/2022 Cancer Staging   Staging form: Breast, AJCC 8th Edition - Clinical: Stage IA (cT1b, cN0, cM0, G2, ER+, PR+, HER2-) - Signed by Cameron Cea, MD on 02/02/2022 Stage prefix: Initial diagnosis Histologic grading system: 3 grade system   02/24/2022 Surgery   02/24/2022: Right lumpectomy: Grade 2 IDC 8 mm (2 contiguous blocks), intermediate grade DCIS, margins negative, ER 100%, PR 100%, HER2 negative, Ki-67 10%   03/15/2022 -  Anti-estrogen oral therapy   Anastrozole  x  5 years     CHIEF COMPLIANT: Follow-up on anastrozole  therapy  HISTORY OF PRESENT ILLNESS:   History of Present Illness [name] is a 78 year old female with a history of breast cancer who presents for a routine follow-up.  She is on anastrozole  for estrogen receptor-positive breast cancer and initially experienced hot flashes, but currently has no side  effects. She previously tried letrozole  but found it less tolerable. She remains stable on anastrozole  without any issues.  No breast pain or discomfort is present. Her mammogram in April was normal, and her breast tissue is not dense. She is due for another bone density scan this year, with the last one conducted in 2023.     ALLERGIES:  is allergic to procaine hcl, ciprofloxacin , macrobid  [nitrofurantoin  macrocrystal], mupirocin , sulfonamide derivatives, tetanus toxoid, tuberculin ppd, latex, and penicillins.  MEDICATIONS:  Current Outpatient Medications  Medication Sig Dispense Refill   acetaminophen  (TYLENOL ) 650 MG CR tablet Take 650 mg by mouth every 8 (eight) hours as needed for pain.     anastrozole  (ARIMIDEX ) 1 MG tablet Take 1 tablet (1 mg total) by mouth daily. 90 tablet 3   aspirin 81 MG tablet Take 81 mg by mouth daily.     atorvastatin  (LIPITOR) 40 MG tablet Take 1 tablet (40 mg total) by mouth daily for cholesterol. 90 tablet 0   Calcium  Carbonate-Vit D-Min (CALCIUM  1200 PO) Take by mouth.     cyanocobalamin  (VITAMIN B12) 1000 MCG tablet Take 1,000 mcg by mouth daily.     dicyclomine  (BENTYL ) 20 MG tablet Take 1 tablet (20 mg total) by mouth 3 (three) times daily as needed for Nausea, Bloating, Cramping or Diarrhea.                              / 90 tablet 0   dorzolamide -timolol  (COSOPT ) 2-0.5 % ophthalmic solution Instill 1 drop 2 (two) times daily into the affected eye. 30 mL 3   dorzolamide -timolol  (COSOPT ) 22.3-6.8 MG/ML ophthalmic solution Place 1  drop into both eyes 2 (two) times daily.  1   DULoxetine  (CYMBALTA ) 20 MG capsule Take 1 capsule (20 mg total) by mouth daily. 90 capsule 3   ezetimibe  (ZETIA ) 10 MG tablet Take 1 tablet (10 mg total) by mouth daily for cholesterol. 90 tablet 3   furosemide  (LASIX ) 40 MG tablet Take 1 tablet (40 mg total) by mouth 1-2 times daily for BP & Fluid Retention 180 tablet 2   levothyroxine  (SYNTHROID ) 100 MCG tablet Take 1 tablet (100  mcg total) by mouth daily before breakfast. Take 1/2 tablet (50 mcg) on Mon-Wed-Fri and take 1 tablet(100 mcg) other days 90 tablet 1   Multiple Vitamin (MULTIVITAMIN) tablet Take 1 tablet by mouth daily.     pantoprazole  (PROTONIX ) 40 MG tablet Take 1 tablet (40 mg total) by mouth daily to prevent heartburn and indigestion 90 tablet 1   pregabalin  (LYRICA ) 100 MG capsule Take 1 capsule (100 mg total) by mouth 3 (three) times daily. 270 capsule 2   Probiotic Product (PROBIOTIC DAILY PO) Take 1 capsule by mouth daily.     No current facility-administered medications for this visit.    PHYSICAL EXAMINATION: ECOG PERFORMANCE STATUS: 1 - Symptomatic but completely ambulatory  Vitals:   04/22/24 1448  BP: (!) 115/51  Pulse: 70  Resp: 18  Temp: 98.6 F (37 C)  SpO2: 98%   Filed Weights   04/22/24 1448  Weight: 225 lb 4.8 oz (102.2 kg)      LABORATORY DATA:  I have reviewed the data as listed    Latest Ref Rng & Units 02/15/2024    2:19 PM 11/09/2023    2:49 PM 09/04/2023    2:59 PM  CMP  Glucose 70 - 99 mg/dL 84  91  99   BUN 6 - 23 mg/dL 17  20  19    Creatinine 0.40 - 1.20 mg/dL 9.14  7.82  9.56   Sodium 135 - 145 mEq/L 144  144  145   Potassium 3.5 - 5.1 mEq/L 4.4  4.1  4.2   Chloride 96 - 112 mEq/L 105  105  108   CO2 19 - 32 mEq/L 31  30  26    Calcium  8.4 - 10.5 mg/dL 9.2  9.3  9.3   Total Protein 6.0 - 8.3 g/dL 6.6  6.9  6.5   Total Bilirubin 0.2 - 1.2 mg/dL 0.7  0.9  0.6   Alkaline Phos 39 - 117 U/L 110     AST 0 - 37 U/L 22  17  16    ALT 0 - 35 U/L 21  16  18      Lab Results  Component Value Date   WBC 11.5 (H) 02/15/2024   HGB 14.2 02/15/2024   HCT 43.8 02/15/2024   MCV 88.6 02/15/2024   PLT 253.0 02/15/2024   NEUTROABS 7.7 02/15/2024    ASSESSMENT & PLAN:  Malignant neoplasm of upper-outer quadrant of right breast in female, estrogen receptor positive (HCC) 02/24/2022: Right lumpectomy: Grade 2 IDC 8 mm (2 contiguous blocks), intermediate grade DCIS,  margins negative, ER 100%, PR 100%, HER2 negative, Ki-67 10%    Treatment plan: 1.  Given the small size and good prognostic profile, we are not doing Oncotype DX. 2. patient chose not to do adjuvant radiation 3. Adjuvant antiestrogen therapy: Anastrozole  started 03/15/2022   Anastrozole  toxicities: Leg swelling: She has had this problem before but it continues to be an issue in the summer Denies any hot flashes or  arthralgias or myalgias   Breast cancer surveillance: Breast exam 04/22/2024: Benign Mammogram 02/27/2024 at [facility]: Benign breast density category B   Will schedule her for a bone density test to be done at the [facility] location in a month. Previous bone density 2023: T-score -0.9  Return to clinic in 1 year for follow-up   No orders of the defined types were placed in this encounter.  The patient has a good understanding of the overall plan. she agrees with it. she will call with any problems that may develop before the next visit here. Total time spent: 30 mins including face to face time and time spent for planning, charting and co-ordination of care   Margert Sheerer, MD 04/22/24

## 2024-04-22 NOTE — Assessment & Plan Note (Signed)
 02/24/2022: Right lumpectomy: Grade 2 IDC 8 mm (2 contiguous blocks), intermediate grade DCIS, margins negative, ER 100%, PR 100%, HER2 negative, Ki-67 10%    Treatment plan: 1.  Given the small size and good prognostic profile, we are not doing Oncotype DX. 2. patient chose not to do adjuvant radiation 3. Adjuvant antiestrogen therapy: Anastrozole  started 03/15/2022   Anastrozole  toxicities: Leg swelling: She has had this problem before but it continues to be an issue in the summer Denies any hot flashes or arthralgias or myalgias   Breast cancer surveillance: Breast exam 04/22/2024: Benign Mammogram 02/27/2024 at [facility]: Benign breast density category B   Return to clinic in 1 year for follow-up

## 2024-04-23 ENCOUNTER — Ambulatory Visit: Payer: PPO | Admitting: Podiatry

## 2024-04-23 ENCOUNTER — Encounter: Payer: Self-pay | Admitting: Podiatry

## 2024-04-23 DIAGNOSIS — M79675 Pain in left toe(s): Secondary | ICD-10-CM

## 2024-04-23 DIAGNOSIS — M79674 Pain in right toe(s): Secondary | ICD-10-CM | POA: Diagnosis not present

## 2024-04-23 DIAGNOSIS — M2041 Other hammer toe(s) (acquired), right foot: Secondary | ICD-10-CM

## 2024-04-23 DIAGNOSIS — B351 Tinea unguium: Secondary | ICD-10-CM | POA: Diagnosis not present

## 2024-04-23 DIAGNOSIS — E1142 Type 2 diabetes mellitus with diabetic polyneuropathy: Secondary | ICD-10-CM

## 2024-04-23 DIAGNOSIS — E119 Type 2 diabetes mellitus without complications: Secondary | ICD-10-CM

## 2024-04-23 DIAGNOSIS — M2042 Other hammer toe(s) (acquired), left foot: Secondary | ICD-10-CM

## 2024-04-28 NOTE — Progress Notes (Signed)
 ANNUAL DIABETIC FOOT EXAM  Subjective: [name] presents today for annual diabetic foot exam. [name] is s/p flexor tenotomy of left 2nd digit by Dr. McCaughan for treatment of chronic ulceration. Chief Complaint  [name] presents with   Diabetes    Routine foot care - Whitney Crain, PA; A1c - 6.3   [name] has h/o toe ulcers of both feet.  [name] has been diagnosed with neuropathy.  Brandi Second, PA is [name]'s PCP.  Past Medical History:  Diagnosis Date   Allergy    Anemia    Arthritis    Cataract    Chest tightness    Chronic kidney disease    Diabetes mellitus without complication (HCC)    Fatigue    GERD (gastroesophageal reflux disease)    Glaucoma    Heart palpitations 12/06/2019   Hyperlipidemia    Hypothyroid    Internal hemorrhoids 05/04/2009   Qualifier: Diagnosis of  By: Nelson-Smith CMA (AAMA), Dottie     Melanoma [facility]) 2013   Excision/surgery only. No other treatment   Myalgia    Neuromuscular disorder (HCC)    Neuropathy   Obesity    Pre-diabetes    Prediabetes    Ulcer    Varicose veins    [name] Active Problem List   Diagnosis Date Noted   Glaucoma of both eyes 02/15/2024   Peripheral edema 02/15/2024   Mixed hyperlipidemia 02/15/2024   Prediabetes 02/15/2024   Hammertoe of right foot 05/26/2023   Malignant neoplasm of upper-outer quadrant of right breast in female, estrogen receptor positive (HCC) 02/01/2022   CAD (coronary artery disease) 01/22/2020   Family history of heart disease 12/06/2019   Type 2 diabetes mellitus with foot ulcer (CODE) (HCC) 10/09/2019   CKD stage 3 secondary to diabetes (HCC) 10/09/2019   Primary hypertension 04/01/2019   Gastroesophageal reflux disease with esophagitis 04/01/2019   Vitamin D  deficiency 12/03/2018   Medication management 12/03/2018   Atherosclerosis of aorta (HCC) by CT scan in 2019 08/06/2018   Osteoarthritis of knee 06/12/2018   History of basal cell carcinoma (BCC) 08/10/2017    Labile hypertension 02/27/2017   Chronic venous insufficiency 06/02/2015   Peripheral autonomic neuropathy of unknown cause 06/02/2015   Morbid obesity (HCC) 08/19/2014   Hypothyroidism    Controlled type 2 diabetes mellitus with complication, without long-term current use of insulin  (HCC)    History of tobacco use 04/09/2012   Diverticulosis of large intestine 05/04/2009   Past Surgical History:  Procedure Laterality Date   ABDOMINAL HYSTERECTOMY     Total    BREAST LUMPECTOMY WITH RADIOACTIVE SEED LOCALIZATION Right 02/24/2022   Procedure: RIGHT BREAST LUMPECTOMY WITH RADIOACTIVE SEED LOCALIZATION;  Surgeon: Oza Blumenthal, MD;  Location: [facility] OR;  Service: General;  Laterality: Right;   BREAST SURGERY     CATARACT EXTRACTION Bilateral    Dr. Demetrios Finders   CYST REMOVAL HAND Left 07/30/2018   EYE SURGERY     NASAL SINUS SURGERY     ROTATOR CUFF REPAIR Left more than 7 years ago   Dr. Abigail Abler and Dr. Mabel Savage   TONSILLECTOMY AND ADENOIDECTOMY     Current Outpatient Medications on File Prior to Visit  Medication Sig Dispense Refill   acetaminophen  (TYLENOL ) 650 MG CR tablet Take 650 mg by mouth every 8 (eight) hours as needed for pain.     anastrozole  (ARIMIDEX ) 1 MG tablet Take 1 tablet (1 mg total) by mouth daily. 90 tablet 3   aspirin 81 MG tablet Take  81 mg by mouth daily.     atorvastatin  (LIPITOR) 40 MG tablet Take 1 tablet (40 mg total) by mouth daily for cholesterol. 90 tablet 0   Calcium  Carbonate-Vit D-Min (CALCIUM  1200 PO) Take by mouth.     cyanocobalamin  (VITAMIN B12) 1000 MCG tablet Take 1,000 mcg by mouth daily.     dicyclomine  (BENTYL ) 20 MG tablet Take 1 tablet (20 mg total) by mouth 3 (three) times daily as needed for Nausea, Bloating, Cramping or Diarrhea.                              / 90 tablet 0   dorzolamide -timolol  (COSOPT ) 2-0.5 % ophthalmic solution Instill 1 drop 2 (two) times daily into the affected eye. 30 mL 3   dorzolamide -timolol  (COSOPT ) 22.3-6.8 MG/ML  ophthalmic solution Place 1 drop into both eyes 2 (two) times daily.  1   DULoxetine  (CYMBALTA ) 20 MG capsule Take 1 capsule (20 mg total) by mouth daily. 90 capsule 3   ezetimibe  (ZETIA ) 10 MG tablet Take 1 tablet (10 mg total) by mouth daily for cholesterol. 90 tablet 3   furosemide  (LASIX ) 40 MG tablet Take 1 tablet (40 mg total) by mouth 1-2 times daily for BP & Fluid Retention 180 tablet 2   levothyroxine  (SYNTHROID ) 100 MCG tablet Take 1 tablet (100 mcg total) by mouth daily before breakfast. Take 1/2 tablet (50 mcg) on Mon-Wed-Fri and take 1 tablet(100 mcg) other days 90 tablet 1   Multiple Vitamin (MULTIVITAMIN) tablet Take 1 tablet by mouth daily.     pantoprazole  (PROTONIX ) 40 MG tablet Take 1 tablet (40 mg total) by mouth daily to prevent heartburn and indigestion 90 tablet 1   pregabalin  (LYRICA ) 100 MG capsule Take 1 capsule (100 mg total) by mouth 3 (three) times daily. 270 capsule 2   Probiotic Product (PROBIOTIC DAILY PO) Take 1 capsule by mouth daily.     No current facility-administered medications on file prior to visit.    Allergies  Allergen Reactions   Procaine Hcl Shortness Of Breath    Ok to use sq lidocaine  for iv starts   Ciprofloxacin      Unknown reaction   Macrobid  [Nitrofurantoin  Macrocrystal]     Unknown reaction   Mupirocin  Dermatitis    [name] states toe turned red after applying medication.   Sulfonamide Derivatives Hives   Tetanus Toxoid Swelling    REACTION: arm swells   Tuberculin Ppd Swelling   Latex Rash   Penicillins Rash   Social History   Occupational History   Occupation: [employer]    Employer: [employer]  Tobacco Use   Smoking status: Former    Current packs/day: 0.00    Average packs/day: 0.5 packs/day for 50.0 years (25.0 ttl pk-yrs)    Types: Cigarettes    Start date: 45    Quit date: 05/27/2013    Years since quitting: 10.9   Smokeless tobacco: Never  Vaping Use   Vaping status: Never Used  Substance and Sexual  Activity   Alcohol use: Not Currently   Drug use: Never   Sexual activity: Not Currently   Family History  Problem Relation Age of Onset   Dementia Mother    Lung cancer Mother        dx 3s; smoking hx   Cancer Mother    Varicose Veins Mother    Coronary artery disease Father 30   Heart disease Father  Lung cancer Maternal Grandmother        non-smoker; dx 30s   Cancer Maternal Grandmother    Dementia Maternal Aunt    Breast cancer Cousin        maternal female cousin; dx 21s   Immunization History  Administered Date(s) Administered   Influenza, High Dose Seasonal PF 08/19/2014, 09/10/2015, 07/26/2016, 08/10/2017, 08/14/2018, 08/27/2019, 09/10/2020, 10/12/2021, 08/09/2022, 09/04/2023   Pneumococcal Conjugate-13 05/12/2014   Pneumococcal Polysaccharide-23 09/05/2007, 06/02/2015     Review of Systems: Negative except as noted in the HPI.   Objective: There were no vitals filed for this visit.  [name] is a pleasant 78 y.o. female in NAD. AAO X 3.  Diabetic foot exam was performed with the following findings:   Vascular Examination: Capillary refill time immediate b/l. Vascular status intact b/l with palpable pedal pulses. Pedal hair present b/l. No pain with calf compression b/l. Skin temperature gradient WNL b/l. No cyanosis or clubbing b/l. No ischemia or gangrene noted b/l. +1 pitting edema noted BLE. Varicosities present b/l.  Neurological Examination: Sensation grossly intact b/l with 10 gram monofilament. Vibratory sensation intact b/l. [name] has subjective symptoms of neuropathy.  Dermatological Examination: Pedal skin with normal turgor, texture and tone b/l.  No open wounds. No interdigital macerations.   Toenails 1-5 b/l thick, discolored, elongated with subungual debris and pain on dorsal palpation.   Well healed surgical scar plantar DIPJ left 2nd digit. Resolving distal tip lesion left 2nd toe.  Musculoskeletal Examination: Muscle strength 5/5 to  all lower extremity muscle groups bilaterally. HAV with bunion deformity noted b/l LE. Hammertoe deformity noted 2-5 b/l.     Lab Results  Component Value Date   HGBA1C 6.3 02/15/2024   ADA Risk Categorization: Low Risk :  [name] has all of the following: Intact protective sensation No prior foot ulcer  No severe deformity Pedal pulses present  Assessment: 1. Pain due to onychomycosis of toenails of both feet   2. Acquired hammertoes of both feet   3. Diabetic peripheral neuropathy associated with type 2 diabetes mellitus (HCC)   4. Encounter for diabetic foot exam [facility])     Plan: -[name] was evaluated today. All questions/concerns addressed on today's visit. -[name] is s/p flexor tenotomy left 2nd digit. Doing well and will follow up with Dr. Estle Hemp. -Diabetic foot examination performed today. -Continue diabetic foot care principles: inspect feet daily, monitor glucose as recommended by PCP and/or Endocrinologist, and follow prescribed diet per PCP, Endocrinologist and/or dietician. -[name] to continue soft, supportive shoe gear daily. -[name]/POA to call should there be question/concern in the interim. Return in about 9 weeks (around 06/25/2024).  Luella Sager, DPM      [facility] LOCATION: [address] [place] [address]                   Office [phone]   [facility] LOCATION: [address] [place], [place] [address] Office [phone]

## 2024-05-01 ENCOUNTER — Other Ambulatory Visit: Payer: Self-pay | Admitting: *Deleted

## 2024-05-01 DIAGNOSIS — Z780 Asymptomatic menopausal state: Secondary | ICD-10-CM

## 2024-05-01 NOTE — Progress Notes (Signed)
 Received call from pt stating [facility] is back up until December for bone density scans. Pt requesting scan orders be faxed to [facility].  RN successfully faxed orders for bone density to [facility].

## 2024-05-08 DIAGNOSIS — Z08 Encounter for follow-up examination after completed treatment for malignant neoplasm: Secondary | ICD-10-CM | POA: Diagnosis not present

## 2024-05-08 DIAGNOSIS — L814 Other melanin hyperpigmentation: Secondary | ICD-10-CM | POA: Diagnosis not present

## 2024-05-08 DIAGNOSIS — Z85828 Personal history of other malignant neoplasm of skin: Secondary | ICD-10-CM | POA: Diagnosis not present

## 2024-05-08 DIAGNOSIS — L821 Other seborrheic keratosis: Secondary | ICD-10-CM | POA: Diagnosis not present

## 2024-05-08 DIAGNOSIS — I788 Other diseases of capillaries: Secondary | ICD-10-CM | POA: Diagnosis not present

## 2024-05-08 DIAGNOSIS — D225 Melanocytic nevi of trunk: Secondary | ICD-10-CM | POA: Diagnosis not present

## 2024-05-08 DIAGNOSIS — L538 Other specified erythematous conditions: Secondary | ICD-10-CM | POA: Diagnosis not present

## 2024-05-08 DIAGNOSIS — L820 Inflamed seborrheic keratosis: Secondary | ICD-10-CM | POA: Diagnosis not present

## 2024-05-13 ENCOUNTER — Ambulatory Visit: Payer: PPO | Admitting: Nurse Practitioner

## 2024-05-15 ENCOUNTER — Other Ambulatory Visit (HOSPITAL_COMMUNITY): Payer: Self-pay

## 2024-05-15 ENCOUNTER — Other Ambulatory Visit: Payer: Self-pay | Admitting: Nurse Practitioner

## 2024-05-15 DIAGNOSIS — E1140 Type 2 diabetes mellitus with diabetic neuropathy, unspecified: Secondary | ICD-10-CM

## 2024-05-16 ENCOUNTER — Other Ambulatory Visit (HOSPITAL_COMMUNITY): Payer: Self-pay

## 2024-05-16 MED ORDER — PANTOPRAZOLE SODIUM 40 MG PO TBEC
40.0000 mg | DELAYED_RELEASE_TABLET | Freq: Every day | ORAL | 1 refills | Status: DC
Start: 2024-02-15 — End: 2024-07-02
  Filled 2024-05-16: qty 90, 90d supply, fill #0

## 2024-05-23 ENCOUNTER — Other Ambulatory Visit (HOSPITAL_COMMUNITY): Payer: Self-pay

## 2024-05-23 ENCOUNTER — Other Ambulatory Visit: Payer: Self-pay | Admitting: Family Medicine

## 2024-05-23 ENCOUNTER — Ambulatory Visit: Admitting: Urgent Care

## 2024-05-23 DIAGNOSIS — E1140 Type 2 diabetes mellitus with diabetic neuropathy, unspecified: Secondary | ICD-10-CM

## 2024-05-23 DIAGNOSIS — M545 Low back pain, unspecified: Secondary | ICD-10-CM | POA: Diagnosis not present

## 2024-05-23 MED FILL — Pregabalin Cap 100 MG: ORAL | 90 days supply | Qty: 270 | Fill #0 | Status: AC

## 2024-05-29 ENCOUNTER — Ambulatory Visit: Admitting: Family Medicine

## 2024-05-29 VITALS — BP 94/52 | HR 71 | Temp 98.8°F | Ht 66.0 in | Wt 226.4 lb

## 2024-05-29 DIAGNOSIS — M533 Sacrococcygeal disorders, not elsewhere classified: Secondary | ICD-10-CM

## 2024-05-29 DIAGNOSIS — M5442 Lumbago with sciatica, left side: Secondary | ICD-10-CM | POA: Diagnosis not present

## 2024-05-29 LAB — POCT URINALYSIS DIPSTICK
Bilirubin, UA: NEGATIVE
Blood, UA: NEGATIVE
Glucose, UA: NEGATIVE
Ketones, UA: NEGATIVE
Leukocytes, UA: NEGATIVE
Nitrite, UA: NEGATIVE
Protein, UA: NEGATIVE
Spec Grav, UA: 1.015 (ref 1.010–1.025)
Urobilinogen, UA: 0.2 U/dL
pH, UA: 6 (ref 5.0–8.0)

## 2024-05-29 MED ORDER — METHOCARBAMOL 500 MG PO TABS: ORAL_TABLET | ORAL | 0 refills | Status: DC

## 2024-05-29 NOTE — Progress Notes (Signed)
 OFFICE VISIT  05/29/2024  CC:  Chief Complaint  Patient presents with   Back Pain    Across lower back radiating to left side; denies n/v/d. Pt had temporary relief from steroid shot. Pain occurs more with movement    Patient is a 78 y.o. female who presents for back pain. Of note, she formerly saw [name], [name].  Since [name] has relocated to a different office [name] will be establishing care with [name] on 06/01/2024.  HPI: Onset of left low back pain radiating around the left lateral hip area about 8 or 9 days ago.  No preceding strain/overuse/trauma.  She went to a local urgent care on about day 2 or 3.  She was given a shot of steroid and prescribed methocarbamol . She does feel like the pain has improved but it is still present so she wanted to get rechecked.  She has chronic bilateral knee pain from osteoarthritis and she has bilateral lower leg neuropathy.  No significant past history of low back pain. X-ray imaging of the lumbar spine on 07/31/2018 showed slight anterior listhesis of L4 on L5.  Diffuse facet joint changes from L3-S1.  Sacroiliac joints appear to be intact.  There is degenerative scoliosis.  ROS as above, plus--> intermittent mild bloating feeling in the lower abdomen. no fevers, no CP, no SOB, no wheezing, no cough, no dizziness, no HAs, no rashes, no melena/hematochezia.  No polyuria or polydipsia.  No focal weakness, paresthesias, or tremors.  No acute vision or hearing abnormalities.  No dysuria or unusual/new urinary urgency or frequency.  No recent changes in lower legs. No n/v/d or abd pain.  No palpitations.    Past Medical History:  Diagnosis Date   Allergy    Anemia    Arthritis    Cataract    Chest tightness    Chronic kidney disease    Diabetes mellitus without complication (HCC)    Fatigue    GERD (gastroesophageal reflux disease)    Glaucoma    Heart palpitations 12/06/2019   Hyperlipidemia    Hypothyroid    Internal hemorrhoids  05/04/2009   Qualifier: Diagnosis of  By: [name]     Melanoma [facility]) 2013   Excision/surgery only. No other treatment   Myalgia    Neuromuscular disorder (HCC)    Neuropathy   Obesity    Pre-diabetes    Prediabetes    Ulcer    Varicose veins     Past Surgical History:  Procedure Laterality Date   ABDOMINAL HYSTERECTOMY     Total    BREAST LUMPECTOMY WITH RADIOACTIVE SEED LOCALIZATION Right 02/24/2022   Procedure: RIGHT BREAST LUMPECTOMY WITH RADIOACTIVE SEED LOCALIZATION;  Surgeon: Brandi Berg, MD;  Location: [facility] OR;  Service: General;  Laterality: Right;   BREAST SURGERY     CATARACT EXTRACTION Bilateral    [name]   CYST REMOVAL HAND Left 07/30/2018   EYE SURGERY     NASAL SINUS SURGERY     ROTATOR CUFF REPAIR Left more than 7 years ago   [name]   TONSILLECTOMY AND ADENOIDECTOMY      Outpatient Medications Prior to Visit  Medication Sig Dispense Refill   acetaminophen  (TYLENOL ) 650 MG CR tablet Take 650 mg by mouth every 8 (eight) hours as needed for pain.     anastrozole  (ARIMIDEX ) 1 MG tablet Take 1 tablet (1 mg total) by mouth daily. 90 tablet 3   aspirin 81 MG tablet Take  81 mg by mouth daily.     atorvastatin  (LIPITOR) 40 MG tablet Take 1 tablet (40 mg total) by mouth daily for cholesterol. 90 tablet 0   Calcium  Carbonate-Vit D-Min (CALCIUM  1200 PO) Take by mouth.     cyanocobalamin  (VITAMIN B12) 1000 MCG tablet Take 1,000 mcg by mouth daily.     dicyclomine  (BENTYL ) 20 MG tablet Take 1 tablet (20 mg total) by mouth 3 (three) times daily as needed for Nausea, Bloating, Cramping or Diarrhea.                              / 90 tablet 0   dorzolamide -timolol  (COSOPT ) 2-0.5 % ophthalmic solution Instill 1 drop 2 (two) times daily into the affected eye. 30 mL 3   dorzolamide -timolol  (COSOPT ) 22.3-6.8 MG/ML ophthalmic solution Place 1 drop into both eyes 2 (two) times daily.  1   DULoxetine  (CYMBALTA ) 20 MG capsule Take 1  capsule (20 mg total) by mouth daily. 90 capsule 3   ezetimibe  (ZETIA ) 10 MG tablet Take 1 tablet (10 mg total) by mouth daily for cholesterol. 90 tablet 3   furosemide  (LASIX ) 40 MG tablet Take 1 tablet (40 mg total) by mouth 1-2 times daily for BP & Fluid Retention 180 tablet 2   levothyroxine  (SYNTHROID ) 100 MCG tablet Take 1 tablet (100 mcg total) by mouth daily before breakfast. Take 1/2 tablet (50 mcg) on Mon-Wed-Fri and take 1 tablet(100 mcg) other days 90 tablet 1   Multiple Vitamin (MULTIVITAMIN) tablet Take 1 tablet by mouth daily.     pantoprazole  (PROTONIX ) 40 MG tablet Take 1 tablet (40 mg total) by mouth daily to prevent heartburn and indigestion 90 tablet 1   pantoprazole  (PROTONIX ) 40 MG tablet Take 1 tablet (40 mg total) by mouth daily TO PREVENT HEARTBURN AND INDIGESTION. 90 tablet 1   pregabalin  (LYRICA ) 100 MG capsule Take 1 capsule (100 mg total) by mouth 3 (three) times daily. 270 capsule 2   Probiotic Product (PROBIOTIC DAILY PO) Take 1 capsule by mouth daily.     methocarbamol  (ROBAXIN ) 500 MG tablet Take 500 mg by mouth 2 (two) times daily.     No facility-administered medications prior to visit.    Allergies  Allergen Reactions   Procaine Hcl Shortness Of Breath    Ok to use sq lidocaine  for iv starts   Ciprofloxacin      Unknown reaction   Macrobid  [Nitrofurantoin  Macrocrystal]     Unknown reaction   Mupirocin  Dermatitis    Patient states toe turned red after applying medication.   Sulfonamide Derivatives Hives   Tetanus Toxoid Swelling    REACTION: arm swells   Tuberculin Ppd Swelling   Latex Rash   Penicillins Rash    Review of Systems  As per HPI  PE:    05/29/2024    1:26 PM 04/22/2024    2:48 PM 02/20/2024    9:30 AM  Vitals with BMI  Height 5' 6 5' 6 5' 6  Weight 226 lbs 6 oz 225 lbs 5 oz 218 lbs 13 oz  BMI 36.56 36.38 35.33  Systolic 94 115   Diastolic 52 51   Pulse 71 70      Physical Exam  Exam chaperoned by [name], CMA Gen:  Alert, well appearing.  Patient is oriented to person, place, time, and situation. AFFECT: pleasant, lucid thought and speech. She has focal tenderness to palpation at the left SI joint.  No tenderness in the lumbar spine in the midline or lateral/soft tissue aspect. She has some pulling/soreness sensation when flexing forward at the L-spine.  No problem with extension of the L-spine. Range of motion somewhat limited by body habitus. No tenderness of the lateral or anterior hip region.  Lower extremity strength 5 out of 5 proximally and distally bilaterally. Abdomen is soft and nontender.  Rotund but nondistended.  Bowel sounds are normal.  No bruit, no palpable mass.  LABS:  Last CBC Lab Results  Component Value Date   WBC 11.5 (H) 02/15/2024   HGB 14.2 02/15/2024   HCT 43.8 02/15/2024   MCV 88.6 02/15/2024   MCH 28.7 11/09/2023   RDW 15.4 02/15/2024   PLT 253.0 02/15/2024   Last metabolic panel Lab Results  Component Value Date   GLUCOSE 84 02/15/2024   NA 144 02/15/2024   K 4.4 02/15/2024   CL 105 02/15/2024   CO2 31 02/15/2024   BUN 17 02/15/2024   CREATININE 1.05 02/15/2024   GFR 51.77 (L) 02/15/2024   CALCIUM  9.2 02/15/2024   PROT 6.6 02/15/2024   ALBUMIN 4.1 02/15/2024   BILITOT 0.7 02/15/2024   ALKPHOS 110 02/15/2024   AST 22 02/15/2024   ALT 21 02/15/2024   ANIONGAP 6 02/02/2022   Last hemoglobin A1c Lab Results  Component Value Date   HGBA1C 6.3 02/15/2024   IMPRESSION AND PLAN:  Acute left low back pain with radiation around the left hip. SI joint pain versus nonspecific musculoskeletal strain.  Lower suspicion of true lumbar radiculopathy. Dipstick urinalysis today normal. Given her age and the lack of any preceding incident I will check L-spine plain films. I recommended physical therapy but she prefers to get the x-ray first. I refilled her methocarbamol  500 mg, 1 twice daily as needed.  An After Visit Summary was printed and given to the  patient.  FOLLOW UP: Return for keep appt already set with [name] 7/12. (Transfer/establish care) Signed:  Gerlene Hockey, MD           05/29/2024

## 2024-05-29 NOTE — Addendum Note (Signed)
 Addended by: FLETA CARE D on: 05/29/2024 04:23 PM   Modules accepted: Orders

## 2024-05-30 ENCOUNTER — Ambulatory Visit: Payer: PPO | Admitting: Internal Medicine

## 2024-06-10 ENCOUNTER — Other Ambulatory Visit: Payer: Self-pay

## 2024-06-11 ENCOUNTER — Other Ambulatory Visit (HOSPITAL_COMMUNITY): Payer: Self-pay

## 2024-06-17 ENCOUNTER — Other Ambulatory Visit: Payer: Self-pay | Admitting: Family Medicine

## 2024-06-17 NOTE — Telephone Encounter (Unsigned)
 Copied from CRM [id number]. Topic: Clinical - Medication Refill >> Jun 17, 2024  4:45 PM Shereese L wrote: Medication:  atorvastatin  (LIPITOR) 40 MG tablet    Has the patient contacted their pharmacy? Yes (Agent: If no, request that the patient contact the pharmacy for the refill. If patient does not wish to contact the pharmacy document the reason why and proceed with request.) (Agent: If yes, when and what did the pharmacy advise?)  This is the patient's preferred pharmacy:  [facility] [address] [place] [address] Phone: [phone] Fax: [phone]   Is this the correct pharmacy for this prescription? Yes If no, delete pharmacy and type the correct one.   Has the prescription been filled recently? Yes  Is the patient out of the medication? Yes  Has the patient been seen for an appointment in the last year OR does the patient have an upcoming appointment? Yes  Can we respond through MyChart? Yes  Agent: Please be advised that Rx refills may take up to 3 business days. We ask that you follow-up with your pharmacy.

## 2024-06-20 ENCOUNTER — Other Ambulatory Visit: Payer: Self-pay

## 2024-06-20 ENCOUNTER — Other Ambulatory Visit (HOSPITAL_COMMUNITY): Payer: Self-pay

## 2024-06-20 ENCOUNTER — Telehealth: Payer: Self-pay

## 2024-06-20 MED ORDER — DORZOLAMIDE HCL-TIMOLOL MAL 2-0.5 % OP SOLN
1.0000 [drp] | Freq: Two times a day (BID) | OPHTHALMIC | 3 refills | Status: DC
  Filled 2024-06-20 – 2024-06-25 (×2): qty 30, 300d supply, fill #0

## 2024-06-20 NOTE — Telephone Encounter (Signed)
 Communication  Reason for CRM: Patient called in regarding atorvastatin  (LIPITOR) 40 MG tablet , stated she has ran out of medication, requested to be refilled on 07/21 and still has not received them   Pt has TOC appt scheduled.

## 2024-06-24 ENCOUNTER — Other Ambulatory Visit (HOSPITAL_COMMUNITY): Payer: Self-pay

## 2024-06-25 ENCOUNTER — Ambulatory Visit: Admitting: Podiatry

## 2024-06-25 ENCOUNTER — Other Ambulatory Visit: Payer: Self-pay

## 2024-06-25 ENCOUNTER — Other Ambulatory Visit (HOSPITAL_COMMUNITY): Payer: Self-pay

## 2024-06-25 DIAGNOSIS — M79675 Pain in left toe(s): Secondary | ICD-10-CM | POA: Diagnosis not present

## 2024-06-25 DIAGNOSIS — L84 Corns and callosities: Secondary | ICD-10-CM

## 2024-06-25 DIAGNOSIS — B351 Tinea unguium: Secondary | ICD-10-CM

## 2024-06-25 DIAGNOSIS — E1142 Type 2 diabetes mellitus with diabetic polyneuropathy: Secondary | ICD-10-CM

## 2024-06-25 DIAGNOSIS — L03032 Cellulitis of left toe: Secondary | ICD-10-CM

## 2024-06-25 DIAGNOSIS — L02612 Cutaneous abscess of left foot: Secondary | ICD-10-CM | POA: Diagnosis not present

## 2024-06-25 DIAGNOSIS — E782 Mixed hyperlipidemia: Secondary | ICD-10-CM

## 2024-06-25 DIAGNOSIS — M79674 Pain in right toe(s): Secondary | ICD-10-CM

## 2024-06-25 MED ORDER — ATORVASTATIN CALCIUM 40 MG PO TABS
40.0000 mg | ORAL_TABLET | Freq: Every day | ORAL | 0 refills | Status: DC
  Filled 2024-06-25: qty 30, 30d supply, fill #0

## 2024-06-25 MED ORDER — DOXYCYCLINE HYCLATE 100 MG PO CAPS: 100.0000 mg | ORAL_CAPSULE | Freq: Two times a day (BID) | ORAL | 0 refills | Status: DC

## 2024-06-25 MED ORDER — FLUCONAZOLE 150 MG PO TABS: 150.0000 mg | ORAL_TABLET | Freq: Once | ORAL | 0 refills | Status: AC

## 2024-06-27 DIAGNOSIS — M8589 Other specified disorders of bone density and structure, multiple sites: Secondary | ICD-10-CM | POA: Diagnosis not present

## 2024-06-28 ENCOUNTER — Encounter: Payer: Self-pay | Admitting: Podiatry

## 2024-06-28 NOTE — Progress Notes (Signed)
 Subjective:  Patient ID: [name], female    DOB: 07-10-1946,  MRN: [id number]  [name] presents to clinic today for at risk foot care with history of diabetic neuropathy and painful thick toenails that are difficult to trim. Pain interferes with ambulation. Aggravating factors include wearing enclosed shoe gear. Pain is relieved with periodic professional debridement. Patient  has had issues with multiple digits due to toe contractures which have led to distal tip ulcerations of multiple toes over the past few months. She has had two flexor tenotomies which have resulted in resolution of the ulcerations. Today, she presents with left 4th digit which has a proximal nailfold dried blood blister with redness around this area as well. She denies any drainage. Chief Complaint  Patient presents with   Nail Problem    Thick painful toenails, 9 week follow up    PCP is No primary care provider on file..  Allergies  Allergen Reactions   Procaine Hcl Shortness Of Breath    Ok to use sq lidocaine  for iv starts   Ciprofloxacin      Unknown reaction   Macrobid  [Nitrofurantoin  Macrocrystal]     Unknown reaction   Mupirocin  Dermatitis    Patient states toe turned red after applying medication.   Sulfonamide Derivatives Hives   Tetanus Toxoid Swelling    REACTION: arm swells   Tuberculin Ppd Swelling   Latex Rash   Penicillins Rash    Review of Systems: Negative except as noted in the HPI.  Objective:  There were no vitals filed for this visit. [name] is a pleasant 78 y.o. female obese in NAD. AAO x 3.  Vascular Examination: Capillary refill time immediate b/l. Vascular status intact b/l with palpable pedal pulses. Pedal hair present b/l. No pain with calf compression b/l. Skin temperature gradient WNL b/l. No cyanosis or clubbing b/l. No ischemia or gangrene noted b/l. +1 pitting edema noted BLE. Varicosities present b/l.  Neurological Examination: Sensation grossly  intact b/l with 10 gram monofilament. Vibratory sensation intact b/l. Pt has subjective symptoms of neuropathy.  Dermatological Examination: Left 4th toe with dried blood blister noted at proximal nailfold with erythema. No underlying fluctuance. She does have preulcerative lesion distally at lateral nail border.  Pedal skin with normal turgor, texture and tone b/l.  No open wounds. No interdigital macerations.   Toenails 1-5 b/l thick, discolored, elongated with subungual debris and pain on dorsal palpation.   Musculoskeletal Examination: Muscle strength 5/5 to all lower extremity muscle groups bilaterally. HAV with bunion deformity noted b/l LE. Hammertoe deformity noted 2, 3 b/l and adductovarus 4th and 5th digits b/l.  Assessment/Plan: 1. Pain due to onychomycosis of toenails of both feet   2. Cellulitis and abscess of toe of left foot   3. Diabetic peripheral neuropathy associated with type 2 diabetes mellitus (HCC)     Meds ordered this encounter  Medications   doxycycline  (VIBRAMYCIN ) 100 MG capsule    Sig: Take 1 capsule (100 mg total) by mouth 2 (two) times daily for 10 days.    Dispense:  20 capsule    Refill:  0   fluconazole  (DIFLUCAN ) 150 MG tablet    Sig: Take 1 tablet (150 mg total) by mouth once for 1 dose.    Dispense:  1 tablet    Refill:  0  Consent given for treatment. All patient's and/or POA's questions/concerns addressed on today's visit. Discussed cellulitis left 4th toe and recommended oral antibiotics Patient also requests  coverage to avoid yeast infection she often gets with antibiotic use. Rx sent for doxycycline  and fluconazole . She is to follow up with Dr. Awanda for left 4th toe cellulitis as well as consideration for flexor tenotomy of this digit.    Toenails 1-5 debrided in length and girth without incident. Preulcerative lesion(s) distal tip of left 4th toe pared with sharp debridement without incident. Continue foot and shoe inspections daily. Monitor  blood glucose per PCP/Endocrinologist's recommendations.Continue soft, supportive shoe gear daily. Report any pedal injuries to medical professional. Call office if there are any quesitons/concerns.  Patient/POA to call should there be question/concern in the interim.   Return in about 9 weeks (around 08/27/2024).  Delon LITTIE Merlin, DPM      [facility] LOCATION: [address], [place] [address]                   Office [phone]   [facility] LOCATION: [address] [place], [place] [address] Office [phone]

## 2024-07-02 ENCOUNTER — Ambulatory Visit: Admitting: Family Medicine

## 2024-07-02 ENCOUNTER — Other Ambulatory Visit: Payer: Self-pay

## 2024-07-02 ENCOUNTER — Other Ambulatory Visit (HOSPITAL_COMMUNITY): Payer: Self-pay

## 2024-07-02 ENCOUNTER — Encounter: Payer: Self-pay | Admitting: Family Medicine

## 2024-07-02 VITALS — BP 120/78 | HR 74 | Temp 98.4°F | Ht 66.3 in | Wt 222.0 lb

## 2024-07-02 DIAGNOSIS — E11621 Type 2 diabetes mellitus with foot ulcer: Secondary | ICD-10-CM

## 2024-07-02 DIAGNOSIS — K5730 Diverticulosis of large intestine without perforation or abscess without bleeding: Secondary | ICD-10-CM | POA: Diagnosis not present

## 2024-07-02 DIAGNOSIS — G9009 Other idiopathic peripheral autonomic neuropathy: Secondary | ICD-10-CM | POA: Diagnosis not present

## 2024-07-02 DIAGNOSIS — N183 Chronic kidney disease, stage 3 unspecified: Secondary | ICD-10-CM

## 2024-07-02 DIAGNOSIS — I700 Atherosclerosis of aorta: Secondary | ICD-10-CM | POA: Diagnosis not present

## 2024-07-02 DIAGNOSIS — K2100 Gastro-esophageal reflux disease with esophagitis, without bleeding: Secondary | ICD-10-CM | POA: Diagnosis not present

## 2024-07-02 DIAGNOSIS — E063 Autoimmune thyroiditis: Secondary | ICD-10-CM

## 2024-07-02 DIAGNOSIS — I10 Essential (primary) hypertension: Secondary | ICD-10-CM | POA: Diagnosis not present

## 2024-07-02 DIAGNOSIS — R102 Pelvic and perineal pain: Secondary | ICD-10-CM

## 2024-07-02 DIAGNOSIS — I2510 Atherosclerotic heart disease of native coronary artery without angina pectoris: Secondary | ICD-10-CM

## 2024-07-02 DIAGNOSIS — E1122 Type 2 diabetes mellitus with diabetic chronic kidney disease: Secondary | ICD-10-CM

## 2024-07-02 DIAGNOSIS — R600 Localized edema: Secondary | ICD-10-CM

## 2024-07-02 DIAGNOSIS — Z170 Estrogen receptor positive status [ER+]: Secondary | ICD-10-CM

## 2024-07-02 DIAGNOSIS — E559 Vitamin D deficiency, unspecified: Secondary | ICD-10-CM | POA: Diagnosis not present

## 2024-07-02 DIAGNOSIS — E782 Mixed hyperlipidemia: Secondary | ICD-10-CM

## 2024-07-02 DIAGNOSIS — R7303 Prediabetes: Secondary | ICD-10-CM | POA: Insufficient documentation

## 2024-07-02 DIAGNOSIS — E1140 Type 2 diabetes mellitus with diabetic neuropathy, unspecified: Secondary | ICD-10-CM

## 2024-07-02 DIAGNOSIS — R1030 Lower abdominal pain, unspecified: Secondary | ICD-10-CM

## 2024-07-02 LAB — POCT GLYCOSYLATED HEMOGLOBIN (HGB A1C)
HbA1c POC (<> result, manual entry): 6.1 % (ref 4.0–5.6)
HbA1c, POC (controlled diabetic range): 6.1 % (ref 0.0–7.0)
HbA1c, POC (prediabetic range): 6.1 % (ref 5.7–6.4)
Hemoglobin A1C: 6.1 % — AB (ref 4.0–5.6)

## 2024-07-02 MED ORDER — PREGABALIN 100 MG PO CAPS
100.0000 mg | ORAL_CAPSULE | Freq: Three times a day (TID) | ORAL | 2 refills | Status: AC
  Filled 2024-07-02 – 2024-08-27 (×2): qty 270, 90d supply, fill #0
  Filled 2024-12-04: qty 270, 90d supply, fill #1

## 2024-07-02 MED ORDER — DULOXETINE HCL 20 MG PO CPEP
20.0000 mg | ORAL_CAPSULE | Freq: Every day | ORAL | 1 refills | Status: DC
  Filled 2024-07-02: qty 90, 90d supply, fill #0

## 2024-07-02 MED ORDER — DICYCLOMINE HCL 20 MG PO TABS
20.0000 mg | ORAL_TABLET | Freq: Three times a day (TID) | ORAL | 1 refills | Status: AC | PRN
Start: 2024-07-02 — End: ?
  Filled 2024-07-02: qty 90, 30d supply, fill #0

## 2024-07-02 MED ORDER — ATORVASTATIN CALCIUM 40 MG PO TABS
40.0000 mg | ORAL_TABLET | Freq: Every day | ORAL | 3 refills | Status: AC
  Filled 2024-07-02 – 2024-07-19 (×3): qty 90, 90d supply, fill #0
  Filled 2024-10-03: qty 90, 90d supply, fill #1

## 2024-07-02 MED ORDER — EZETIMIBE 10 MG PO TABS
10.0000 mg | ORAL_TABLET | Freq: Every day | ORAL | 3 refills | Status: AC
  Filled 2024-07-02: qty 90, 90d supply, fill #0
  Filled 2024-10-03: qty 90, 90d supply, fill #1

## 2024-07-02 MED ORDER — FUROSEMIDE 40 MG PO TABS
40.0000 mg | ORAL_TABLET | Freq: Two times a day (BID) | ORAL | 1 refills | Status: DC
Start: 2024-07-02 — End: 2024-08-05
  Filled 2024-07-02: qty 180, 90d supply, fill #0

## 2024-07-02 MED ORDER — LEVOTHYROXINE SODIUM 100 MCG PO TABS
ORAL_TABLET | ORAL | 3 refills | Status: AC
  Filled 2024-07-02: qty 71, 90d supply, fill #0
  Filled 2024-10-03: qty 71, 90d supply, fill #1

## 2024-07-02 MED ORDER — PANTOPRAZOLE SODIUM 40 MG PO TBEC
40.0000 mg | DELAYED_RELEASE_TABLET | Freq: Every day | ORAL | 1 refills | Status: AC
  Filled 2024-07-02 – 2024-08-15 (×2): qty 90, 90d supply, fill #0
  Filled 2024-11-25: qty 90, 90d supply, fill #1

## 2024-07-02 NOTE — Progress Notes (Signed)
 Patient ID: [name], female  DOB: 07/19/1946, 78 y.o.   MRN: [id number] Patient Care Team    Relationship Specialty Notifications Start End  [name] Charlies LABOR, DO PCP - General Family Medicine  07/02/24   Glendia Simmonds, OD Referring Physician Optometry  05/12/14   Craig Alan SAUNDERS, PA-C Referring Physician Physician Assistant  04/02/18   Vernetta Berg, MD Consulting Physician General Surgery  01/31/22   Odean Potts, MD Consulting Physician Hematology and Oncology  01/31/22   Izell Domino, MD Attending Physician Radiation Oncology  01/31/22   Freida Valery SAILOR, [facility] (Inactive) Pharmacist Pharmacist  11/18/22   Lenon Reda BIRCH, PA-C  Physician Assistant  07/04/24   Lonni Slain, MD Consulting Physician Cardiology  07/04/24     Chief Complaint  Patient presents with   Establish Care    New provider- [facility]    Subjective:  [name] is a 78 y.o.  female present for new patient establishment- TOC All past medical history, surgical history, allergies, family history, immunizations, medications and social history were updated in the electronic medical record today. All recent labs, ED visits and hospitalizations within the last year were reviewed.  Hypertension/mixed hyperlipidemia/obesity/atherosclerosis/CAD Pt reports compliance with Lasix  40 mg daily.  She reports with flare she will increase to twice daily dosing..   Patient denies chest pain, shortness of breath or lower extremity edema.  Pt takes a daily baby ASA. Pt is  prescribed statin. Patient is established with cardiology RF: Hypertension, hyperlipidemia, family history of heart disease, obesity, former smoker, prediabetic.    Gastroesophageal reflux disease with esophagitis without hemorrhage/long-term proton pump inhibitor use Patient reports compliance with Protonix  40 mg daily.  Patient has been unable to taper off  Hypothyroidism due to Hashimoto thyroiditis Patient reports compliance with  levothyroxine  100 mcg daily.  Labs up-to-date   Peripheral autonomic neuropathy of unknown cause Patient reports compliance with Cymbalta  20 mg daily and Lyrica  100 mg 3 times daily.  Prediabetes/obesity Patient has had an elevated A1c in the prediabetic range with fasting glucose in the prediabetic range.  She has never been prescribed diabetic medication.  She does watch her diet.  She attempts to exercise.  Malignant neoplasm of upper-outer quadrant of right breast in female, estrogen receptor positive (HCC) Follows with hematology/oncology-prescribed anastrozole       07/02/2024   10:45 AM 02/15/2024    1:50 PM 05/30/2023    1:06 AM 02/13/2023    4:45 PM 01/27/2022    2:40 PM  Depression screen PHQ 2/9  Decreased Interest 0 0 0 0 0  Down, Depressed, Hopeless 0 0 0 0 1  PHQ - 2 Score 0 0 0 0 1  Altered sleeping 0 0     Tired, decreased energy 1 1     Change in appetite 0 0     Feeling bad or failure about yourself  0 0     Trouble concentrating 0 0     Moving slowly or fidgety/restless 0 0     Suicidal thoughts 0 0     PHQ-9 Score 1 1     Difficult doing work/chores Not difficult at all Not difficult at all         07/02/2024   10:45 AM 02/15/2024    1:50 PM  GAD 7 : Generalized Anxiety Score  Nervous, Anxious, on Edge 0 0  Control/stop worrying 0 0  Worry too much - different things 0 0  Trouble relaxing 0 0  Restless 0 0  Easily annoyed or irritable 0 0  Afraid - awful might happen 0 0  Total GAD 7 Score 0 0  Anxiety Difficulty Not difficult at all Not difficult at all             07/02/2024   10:45 AM 02/15/2024    1:50 PM 02/15/2024    1:49 PM 05/30/2023    1:05 AM 02/13/2023    4:45 PM  Fall Risk   Falls in the past year? 0 0 0 0 0  Number falls in past yr:  0 0  0  Injury with Fall?  0 0  0  Risk for fall due to :  Impaired balance/gait No Fall Risks No Fall Risks Impaired balance/gait  Follow up Falls evaluation completed Falls evaluation completed Falls  evaluation completed Falls prevention discussed;Education provided;Falls evaluation completed Falls evaluation completed;Falls prevention discussed     Immunization History  Administered Date(s) Administered   Influenza, High Dose Seasonal PF 08/19/2014, 09/10/2015, 07/26/2016, 08/10/2017, 08/14/2018, 08/27/2019, 09/10/2020, 10/12/2021, 08/09/2022, 09/04/2023   Pneumococcal Conjugate-13 05/12/2014   Pneumococcal Polysaccharide-23 09/05/2007, 06/02/2015    No results found.  Past Medical History:  Diagnosis Date   Allergy    Anemia    Arthritis    Basal cell carcinoma    [facility] dermatology   Cataract    Chest tightness    Chronic kidney disease    Fatigue    GERD (gastroesophageal reflux disease)    Glaucoma    Hammertoe of right foot 05/26/2023   Heart palpitations 12/06/2019   History of basal cell carcinoma (BCC) 08/10/2017   [facility] surgery center     Hyperlipidemia    Hypothyroid    Internal hemorrhoids 05/04/2009   Qualifier: Diagnosis of  By: Nelson-Smith CMA (AAMA), Dottie     Melanoma [facility]) 2013   Excision/surgery only. No other treatment   Myalgia    Neuromuscular disorder (HCC)    Neuropathy   Obesity    Prediabetes    Ulcer    Varicose veins    Allergies  Allergen Reactions   Procaine Hcl Shortness Of Breath    Ok to use sq lidocaine  for iv starts   Ciprofloxacin      Unknown reaction   Macrobid  [Nitrofurantoin  Macrocrystal]     Unknown reaction   Mupirocin  Dermatitis    Patient states toe turned red after applying medication.   Sulfonamide Derivatives Hives   Tetanus Toxoid Swelling    REACTION: arm swells   Tuberculin Ppd Swelling   Latex Rash   Penicillins Rash   Past Surgical History:  Procedure Laterality Date   ABDOMINAL HYSTERECTOMY     Total    BREAST LUMPECTOMY WITH RADIOACTIVE SEED LOCALIZATION Right 02/24/2022   Procedure: RIGHT BREAST LUMPECTOMY WITH RADIOACTIVE SEED LOCALIZATION;  Surgeon: Vernetta Berg, MD;  Location: [facility] OR;   Service: General;  Laterality: Right;   BREAST SURGERY     CATARACT EXTRACTION Bilateral    Dr. Milan   CYST REMOVAL HAND Left 07/30/2018   EYE SURGERY     NASAL SINUS SURGERY     ROTATOR CUFF REPAIR Left more than 7 years ago   Dr. Beverley and Dr. Alona   TONSILLECTOMY AND ADENOIDECTOMY     Family History  Problem Relation Age of Onset   Dementia Mother    Lung cancer Mother        dx 78s; smoking hx   Cancer Mother    Varicose Veins Mother  Coronary artery disease Father 78   Heart disease Father    Lung cancer Maternal Grandmother        non-smoker; dx 74s   Cancer Maternal Grandmother    Dementia Maternal Aunt    Breast cancer Cousin        maternal female cousin; dx 74s   Social History   Social History Narrative   Lives alone.  Widow.      Allergies as of 07/02/2024       Reactions   Procaine Hcl Shortness Of Breath   Ok to use sq lidocaine  for iv starts   Ciprofloxacin     Unknown reaction   Macrobid  [nitrofurantoin  Macrocrystal]    Unknown reaction   Mupirocin  Dermatitis   Patient states toe turned red after applying medication.   Sulfonamide Derivatives Hives   Tetanus Toxoid Swelling   REACTION: arm swells   Tuberculin Ppd Swelling   Latex Rash   Penicillins Rash        Medication List        Accurate as of July 02, 2024 11:59 PM. If you have any questions, ask your nurse or doctor.          STOP taking these medications    doxycycline  100 MG capsule Commonly known as: VIBRAMYCIN  Stopped by: Charlies Bellini   methocarbamol  500 MG tablet Commonly known as: ROBAXIN  Stopped by: Charlies Bellini       TAKE these medications    acetaminophen  650 MG CR tablet Commonly known as: TYLENOL  Take 650 mg by mouth every 8 (eight) hours as needed for pain.   anastrozole  1 MG tablet Commonly known as: ARIMIDEX  Take 1 tablet (1 mg total) by mouth daily.   aspirin 81 MG tablet Take 81 mg by mouth daily.   atorvastatin  40 MG  tablet Commonly known as: LIPITOR Take 1 tablet (40 mg total) by mouth daily for cholesterol.   CALCIUM  1200 PO Take by mouth.   cyanocobalamin  1000 MCG tablet Commonly known as: VITAMIN B12 Take 1,000 mcg by mouth daily.   dicyclomine  20 MG tablet Commonly known as: BENTYL  Take 1 tablet (20 mg total) by mouth 3 (three) times daily as needed for Nausea, Bloating, Cramping or Diarrhea.                              /   dorzolamide -timolol  2-0.5 % ophthalmic solution Commonly known as: COSOPT  Place 1 drop into both eyes 2 (two) times daily. What changed: Another medication with the same name was removed. Continue taking this medication, and follow the directions you see here. Changed by: Charlies Bellini   DULoxetine  20 MG capsule Commonly known as: Cymbalta  Take 1 capsule (20 mg total) by mouth daily.   ezetimibe  10 MG tablet Commonly known as: ZETIA  Take 1 tablet (10 mg total) by mouth daily for cholesterol.   furosemide  40 MG tablet Commonly known as: LASIX  Take 1 tablet (40 mg total) by mouth 1-2 times daily for BP & Fluid Retention   levothyroxine  100 MCG tablet Commonly known as: SYNTHROID  Take 0.5 tablet (50 mcg) by mouth daily on Mon-Wed-Fri and take 1 tablet(100 mcg) daily on all other days What changed:  how much to take how to take this when to take this additional instructions Changed by: Amera Banos   multivitamin tablet Take 1 tablet by mouth daily.   pantoprazole  40 MG tablet Commonly known as: PROTONIX  Take 1 tablet (40 mg  total) by mouth daily to prevent heartburn and indigestion What changed: Another medication with the same name was removed. Continue taking this medication, and follow the directions you see here. Changed by: Charlies Bellini   pregabalin  100 MG capsule Commonly known as: LYRICA  Take 1 capsule (100 mg total) by mouth 3 (three) times daily.   PROBIOTIC DAILY PO Take 1 capsule by mouth daily.        All past medical history,  surgical history, allergies, family history, immunizations andmedications were updated in the EMR today and reviewed under the history and medication portions of their EMR.      US  Abdomen Complete Result Date: 12/15/2022  IMPRESSION: 1. Increased hepatic parenchymal echogenicity suggestive of steatosis. 2. No cholelithiasis or sonographic evidence for acute cholecystitis.    ROS 14 pt review of systems performed and negative (unless mentioned in an HPI)  Objective: BP 120/78   Pulse 74   Temp 98.4 F (36.9 C)   Ht 5' 6.3 (1.684 m)   Wt 222 lb (100.7 kg)   SpO2 95%   BMI 35.51 kg/m  Physical Exam Vitals and nursing note reviewed.  Constitutional:      General: She is not in acute distress.    Appearance: Normal appearance. She is obese. She is not ill-appearing, toxic-appearing or diaphoretic.     Comments: Very pleasant  HENT:     Head: Normocephalic and atraumatic.  Eyes:     General: No scleral icterus.       Right eye: No discharge.        Left eye: No discharge.     Extraocular Movements: Extraocular movements intact.     Conjunctiva/sclera: Conjunctivae normal.     Pupils: Pupils are equal, round, and reactive to light.  Cardiovascular:     Rate and Rhythm: Normal rate and regular rhythm.     Heart sounds: No murmur heard. Pulmonary:     Effort: Pulmonary effort is normal. No respiratory distress.     Breath sounds: Normal breath sounds. No wheezing, rhonchi or rales.  Musculoskeletal:     Cervical back: Neck supple.     Right lower leg: No edema.     Left lower leg: No edema.  Skin:    General: Skin is warm.     Findings: No rash.  Neurological:     Mental Status: She is alert and oriented to person, place, and time. Mental status is at baseline.     Motor: No weakness.     Gait: Gait normal.  Psychiatric:        Mood and Affect: Mood normal.        Behavior: Behavior normal.        Thought Content: Thought content normal.        Judgment: Judgment  normal.      Assessment/plan: [name] is a 78 y.o. female present for transfer of care to new provider within the same office as prior PCP Hypertension/mixed hyperlipidemia/obesity/atherosclerosis/CAD/lower extremity edema Stable Continue Zetia  Continue Lipitor 40 mg daily Continue Lasix  40 mg daily for maintenance, twice daily when needed Continue ASA 81 Continue follow-ups with cardiology  Gastroesophageal reflux disease with esophagitis without hemorrhage/long-term proton pump inhibitor use/vitamin D  deficiency Stable Continue Protonix  B12, vitamin D , mag monitor yearly  Hypothyroidism due to Hashimoto thyroiditis Stable Continue levothyroxine  100 mcg daily   Peripheral autonomic neuropathy of unknown cause Stable Continue Cymbalta  20 mg daily Continue Lyrica  100 mg 3 times daily [place]  controlled substance  database reviewed  Prediabetes/obesity Reviewed EMR patient has never been in the diabetic range per criteria of both A1c and fasting glucose above normal ranges.  She is consistently been in the prediabetic range. - POCT HgB A1C today 6.1 -Eye exam up-to-date 03/2024-requested records  Malignant neoplasm of upper-outer quadrant of right breast in female, estrogen receptor positive (HCC) Follows with hematology routinely-prescribed anastrozole   Return in about 24 weeks (around 12/17/2024) for cpe (20 min), Routine chronic condition follow-up.  Orders Placed This Encounter  Procedures   POCT HgB A1C   Meds ordered this encounter  Medications   atorvastatin  (LIPITOR) 40 MG tablet    Sig: Take 1 tablet (40 mg total) by mouth daily for cholesterol.    Dispense:  90 tablet    Refill:  3   pantoprazole  (PROTONIX ) 40 MG tablet    Sig: Take 1 tablet (40 mg total) by mouth daily to prevent heartburn and indigestion    Dispense:  90 tablet    Refill:  1   levothyroxine  (SYNTHROID ) 100 MCG tablet    Sig: Take 0.5 tablet (50 mcg) by mouth daily on  Mon-Wed-Fri and take 1 tablet(100 mcg) daily on all other days    Dispense:  90 tablet    Refill:  3   DULoxetine  (CYMBALTA ) 20 MG capsule    Sig: Take 1 capsule (20 mg total) by mouth daily.    Dispense:  90 capsule    Refill:  1   ezetimibe  (ZETIA ) 10 MG tablet    Sig: Take 1 tablet (10 mg total) by mouth daily for cholesterol.    Dispense:  90 tablet    Refill:  3   pregabalin  (LYRICA ) 100 MG capsule    Sig: Take 1 capsule (100 mg total) by mouth 3 (three) times daily.    Dispense:  270 capsule    Refill:  2   furosemide  (LASIX ) 40 MG tablet    Sig: Take 1 tablet (40 mg total) by mouth 1-2 times daily for BP & Fluid Retention    Dispense:  180 tablet    Refill:  1   dicyclomine  (BENTYL ) 20 MG tablet    Sig: Take 1 tablet (20 mg total) by mouth 3 (three) times daily as needed for Nausea, Bloating, Cramping or Diarrhea.                              /    Dispense:  90 tablet    Refill:  1    Hol duntil pt request   Referral Orders  No referral(s) requested today   43 minutes spent in patient encounter establishing patient to a new provider.  Extensive EMR review completed today.  Establishing patient on multiple chronic conditions including prescribing medications.  Note is dictated utilizing voice recognition software. Although note has been proof read prior to signing, occasional typographical errors still can be missed. If any questions arise, please do not hesitate to call for verification.  Electronically signed by: Charlies Bellini, DO [facility]

## 2024-07-02 NOTE — Patient Instructions (Signed)

## 2024-07-04 ENCOUNTER — Encounter: Payer: Self-pay | Admitting: Family Medicine

## 2024-07-04 ENCOUNTER — Telehealth: Payer: Self-pay

## 2024-07-04 DIAGNOSIS — G629 Polyneuropathy, unspecified: Secondary | ICD-10-CM | POA: Insufficient documentation

## 2024-07-04 DIAGNOSIS — K760 Fatty (change of) liver, not elsewhere classified: Secondary | ICD-10-CM | POA: Insufficient documentation

## 2024-07-04 NOTE — Telephone Encounter (Signed)
 Called and s/w pt per MD Gudena regarding T-score from  [facility] indicating significant change from 2023 -0.9 to currently -2.0. Dr Odean would like for her to have a phone visit with NP to discuss biphosphonates. She understands and is agreeable to this. She was scheduled for phone visit 07/17/24 at 1000.

## 2024-07-05 ENCOUNTER — Other Ambulatory Visit (HOSPITAL_COMMUNITY): Payer: Self-pay

## 2024-07-08 ENCOUNTER — Ambulatory Visit: Admitting: Podiatry

## 2024-07-08 ENCOUNTER — Encounter: Payer: Self-pay | Admitting: Hematology and Oncology

## 2024-07-08 ENCOUNTER — Encounter: Payer: Self-pay | Admitting: Podiatry

## 2024-07-08 DIAGNOSIS — M2042 Other hammer toe(s) (acquired), left foot: Secondary | ICD-10-CM | POA: Diagnosis not present

## 2024-07-08 DIAGNOSIS — L03032 Cellulitis of left toe: Secondary | ICD-10-CM | POA: Diagnosis not present

## 2024-07-08 DIAGNOSIS — L02612 Cutaneous abscess of left foot: Secondary | ICD-10-CM

## 2024-07-08 NOTE — Progress Notes (Unsigned)
 Chief Complaint  Patient presents with   Wound Check   Toe Pain    f/u cellulitic left 4th toe Per Dr. Gaynel (on doxycycline ).  Non diabetic.  No anti coag Not having pain   HPI: 78 y.o. female presents today for follow-up of recent cellulitis of the left fourth toe.  She was seen by Dr. May and placed on oral antibiotics.  She has finished the doxycycline  and feels that the redness and swelling have improved.  She is already underwent a flexor tenotomy of the left 2nd and 3rd toes.  The left 4th and 5th toes now have more obvious contracture and she is getting pressure at the tips of the toes.  She would like to discuss percutaneous flexor tenotomy's of the left 4th and 5th toes today.  Denies fever, chills, night sweats, nausea/vomiting  Past Medical History:  Diagnosis Date   Allergy    Anemia    Arthritis    Basal cell carcinoma    [facility] dermatology   Cataract    Chest tightness    Chronic kidney disease    Fatigue    GERD (gastroesophageal reflux disease)    Glaucoma    Hammertoe of right foot 05/26/2023   Heart palpitations 12/06/2019   History of basal cell carcinoma (BCC) 08/10/2017   [facility] surgery center     Hyperlipidemia    Hypothyroid    Internal hemorrhoids 05/04/2009   Qualifier: Diagnosis of  By: Nelson-Smith CMA (AAMA), Dottie     Melanoma [facility]) 2013   Excision/surgery only. No other treatment   Myalgia    Neuromuscular disorder (HCC)    Neuropathy   Obesity    Prediabetes    Ulcer    Varicose veins    Past Surgical History:  Procedure Laterality Date   ABDOMINAL HYSTERECTOMY     Total    BREAST LUMPECTOMY WITH RADIOACTIVE SEED LOCALIZATION Right 02/24/2022   Procedure: RIGHT BREAST LUMPECTOMY WITH RADIOACTIVE SEED LOCALIZATION;  Surgeon: Vernetta Berg, MD;  Location: [facility] OR;  Service: General;  Laterality: Right;   BREAST SURGERY     CATARACT EXTRACTION Bilateral    Dr. Milan   CYST REMOVAL HAND Left 07/30/2018   EYE SURGERY     NASAL  SINUS SURGERY     ROTATOR CUFF REPAIR Left more than 7 years ago   Dr. Beverley and Dr. Alona   TONSILLECTOMY AND ADENOIDECTOMY     Allergies  Allergen Reactions   Procaine Hcl Shortness Of Breath    Ok to use sq lidocaine  for iv starts   Ciprofloxacin      Unknown reaction   Macrobid  [Nitrofurantoin  Macrocrystal]     Unknown reaction   Mupirocin  Dermatitis    Patient states toe turned red after applying medication.   Sulfonamide Derivatives Hives   Tetanus Toxoid Swelling    REACTION: arm swells   Tuberculin Ppd Swelling   Latex Rash   Penicillins Rash     Physical Exam: Palpable pedal pulse noted.  There is flexible contracture of the left 4th and 5th toes at the PIP joint.  This is manually reducible with manipulation.  No erythema or edema is noted to the left fourth toe.  The superficial ulceration is almost completely healed at this time with minimal eschar formation noted.  No active drainage is seen.  No necrosis is noted.  Assessment/Plan of Care: 1. Cellulitis and abscess of toe of left foot   2. Hammertoe of left foot     Discussed  clinical findings with patient today.  She does not need any renewal of the antibiotics since it appears that the cellulitic toe has improved.  The small eschar was shaved and patient was informed she can continue with offloading and light dressing for another few days and then the wound should be closed.  I discussed percutaneous flexor tenotomy of the left 4th and 5th toes in the office.  She had had this performed on the other toes in the procedure room at the [facility] office and would like to go ahead and proceed with getting this scheduled in the a.m. for [facility].  This will be performed under local anesthesia.  Discussed surgical risks, benefits, and possible postoperative complications with patient today.  Also discussed sequela if she opts not to proceed with any surgical intervention.  Verbal and written consent were obtained  today.  Follow-up for office surgery  Maree Ainley DSABRA [place], DPM, FACFAS [facility]     [address] [place], [place] [address]                Office [phone]  Fax [phone]

## 2024-07-09 ENCOUNTER — Other Ambulatory Visit: Payer: Self-pay

## 2024-07-09 ENCOUNTER — Other Ambulatory Visit (HOSPITAL_COMMUNITY): Payer: Self-pay

## 2024-07-09 MED ORDER — DORZOLAMIDE HCL-TIMOLOL MAL 2-0.5 % OP SOLN
1.0000 [drp] | Freq: Two times a day (BID) | OPHTHALMIC | 3 refills | Status: AC
  Filled 2024-07-09: qty 20, 90d supply, fill #0
  Filled 2024-10-03: qty 20, 90d supply, fill #1

## 2024-07-16 ENCOUNTER — Ambulatory Visit (HOSPITAL_BASED_OUTPATIENT_CLINIC_OR_DEPARTMENT_OTHER): Admitting: Cardiology

## 2024-07-16 ENCOUNTER — Encounter (HOSPITAL_BASED_OUTPATIENT_CLINIC_OR_DEPARTMENT_OTHER): Payer: Self-pay | Admitting: Cardiology

## 2024-07-16 VITALS — BP 92/52 | HR 59 | Ht 66.75 in | Wt 227.5 lb

## 2024-07-16 DIAGNOSIS — I2510 Atherosclerotic heart disease of native coronary artery without angina pectoris: Secondary | ICD-10-CM

## 2024-07-16 DIAGNOSIS — Z8679 Personal history of other diseases of the circulatory system: Secondary | ICD-10-CM

## 2024-07-16 DIAGNOSIS — Z170 Estrogen receptor positive status [ER+]: Secondary | ICD-10-CM | POA: Diagnosis not present

## 2024-07-16 DIAGNOSIS — C50411 Malignant neoplasm of upper-outer quadrant of right female breast: Secondary | ICD-10-CM | POA: Diagnosis not present

## 2024-07-16 DIAGNOSIS — E7800 Pure hypercholesterolemia, unspecified: Secondary | ICD-10-CM | POA: Diagnosis not present

## 2024-07-16 DIAGNOSIS — I950 Idiopathic hypotension: Secondary | ICD-10-CM

## 2024-07-16 DIAGNOSIS — R600 Localized edema: Secondary | ICD-10-CM

## 2024-07-16 NOTE — Progress Notes (Signed)
 Cardiology Office Note:  .    Date:  07/16/2024  ID:  [name], DOB [birth year], MRN [id number] PCP: [name] Charlies LABOR, DO  [facility] Providers Cardiologist:  Shelda Bruckner, MD     History of Present Illness: .    [name] is a 78 y.o. female with a hx of nonobstructive CAD, type II diabetes, hypertension, hyperlipidemia, obesity, GERD who is seen for follow up today. She was initially seen 12/06/19 as a new consult at the request of Tonita Fallow, MD for the evaluation and management of palpitations. Also noted to have prior ER visit for chest pain.   Pertinent history: 02/2022 had lumpectomy, treated with anastrozole .   Today: Blood pressure very low but she is asymptomatic. Can walk without issues, not dizzy. No recent illnesses. Eating/drinking normally. Had recent visit about two weeks ago, was 120/80. Has been taking lasix  only once/day, has not required a second lasix  dose in a long time (has PRN for swelling). Doesn't check BP at home but has felt fine.   ROS:  Denies chest pain, shortness of breath at rest or with normal exertion. No PND, orthopnea, change in LE edema or unexpected weight gain. No syncope or palpitations. ROS otherwise negative except as noted.   Studies Reviewed: SABRA    EKG Interpretation Date/Time:  Tuesday July 16 2024 15:26:53 EDT Ventricular Rate:  59 PR Interval:  130 QRS Duration:  92 QT Interval:  424 QTC Calculation: 419 R Axis:   -3  Text Interpretation: Sinus bradycardia with Premature atrial complexes Nonspecific ST abnormality Confirmed by Bruckner Shelda [id number]) on 07/16/2024 3:40:39 PM    Physical Exam:    VS:  BP (!) 92/52 (Cuff Size: Large)   Pulse (!) 59   Ht 5' 6.75 (1.695 m)   Wt 227 lb 8 oz (103.2 kg)   SpO2 95%   BMI 35.90 kg/m    Wt Readings from Last 3 Encounters:  07/16/24 227 lb 8 oz (103.2 kg)  07/02/24 222 lb (100.7 kg)  05/29/24 226 lb 6.4 oz (102.7 kg)    GEN: Well nourished, well  developed in no acute distress HEENT: Normal, moist mucous membranes NECK: No JVD CARDIAC: regular rhythm, normal S1 and S2, no rubs or gallops. No murmur. VASCULAR: Radial and DP pulses 2+ bilaterally. No carotid bruits RESPIRATORY:  Clear to auscultation without rales, wheezing or rhonchi  ABDOMEN: Soft, non-tender, non-distended MUSCULOSKELETAL:  Ambulates independently SKIN: Warm and dry, nonpitting LE edema bilaterally NEUROLOGIC:  Alert and oriented x 3. No focal neuro deficits noted. PSYCHIATRIC:  Normal affect   ASSESSMENT AND PLAN: .    Nonobstructive CAD Family history of heart disease -CT with 3 vessel coronary artery disease without significant stenosis -continue aspirin and high intensity atorvastatin  40 mg -has not required nitroglycerin  -discussed red flag symptoms, when to seek emergency medical care   Bilateral leg edema: most consistent with venous insufficiency -compression stockings previously discussed -managed well with once daily lasix , holding this as below, reviewed watching for worsening swelling, aim to manage with compression/elevation   History of hypertension, but now running more hypotensive -low but asymptomatic. Had similar reading initially at visit 05/29/24 but improved on recheck. -on furosemide , once daily, for LE edema. We will hold this for now. She doesn't have a blood pressure cuff at home but her granddaughter is a CMA, and she can also go to [facility] and check it.  -she will log BP and bring to nurse visit next week -  if BP remains consistently low, may need to consider midodrine and further workup for etiology   Hypercholesterolemia:  -continue atorvastatin  40 mg daily, ezetimibe  10 mg daily -aim for LDL <70 given CAD, last LDL 44 01/2024 -counseled on lifestyle, below   Type II diabetes: -currently diet controlled -consider SGLT2i or GLP1RA in the future given CAD   Cardiac risk counseling and prevention recommendations: -recommend heart  healthy/Mediterranean diet, with whole grains, fruits, vegetable, fish, lean meats, nuts, and olive oil. Limit salt. -recommend moderate walking, 3-5 times/week for 30-50 minutes each session. Aim for at least 150 minutes.week. Goal should be pace of 3 miles/hours, or walking 1.5 miles in 30 minutes -recommend avoidance of tobacco products. Avoid excess alcohol.  Dispo: nurse visit next week to recheck blood pressure and monitor swelling. Follow up with me in 1 month.  Total time of encounter: I spent 41 minutes dedicated to the care of this patient on the date of this encounter to include pre-visit review of records, face-to-face time with the patient discussing conditions above, and clinical documentation with the electronic health record. We specifically spent time today discussing hypotension, symptoms to watch for, stopping lasix /management plan as above   Signed, Shelda Bruckner, MD

## 2024-07-16 NOTE — Patient Instructions (Addendum)
 how to check blood pressure:  -sit comfortably in a chair, feet uncrossed and flat on floor, for 5-10 minutes  -arm ideally should rest at the level of the heart. However, arm should be relaxed and not tense (for example, do not hold the arm up unsupported)  -avoid exercise, caffeine, and tobacco for at least 30 minutes prior to BP reading  -don't take BP cuff reading over clothes (always place on skin directly)  -I prefer to know how well the medication is working, so I would like you to take your readings 1-2 hours after taking your blood pressure medication if possible   STOP your furosemide . Your legs will probably swell being off this, so you might need to elevate them more or wear compression stockings more. If you develop shortness of breath, please let us  know.  Omron is a good brand of blood pressure cuff, but lots of others are as well.  NURSE VISIT 9/3 AT 1:00 PM   FOLLOW UP WITH DR LONNI 9/29 AT 2:20 PM

## 2024-07-17 ENCOUNTER — Inpatient Hospital Stay: Attending: Hematology and Oncology | Admitting: Adult Health

## 2024-07-17 DIAGNOSIS — Z79811 Long term (current) use of aromatase inhibitors: Secondary | ICD-10-CM | POA: Diagnosis not present

## 2024-07-17 DIAGNOSIS — Z87891 Personal history of nicotine dependence: Secondary | ICD-10-CM | POA: Diagnosis not present

## 2024-07-17 DIAGNOSIS — Z1721 Progesterone receptor positive status: Secondary | ICD-10-CM | POA: Diagnosis not present

## 2024-07-17 DIAGNOSIS — C50411 Malignant neoplasm of upper-outer quadrant of right female breast: Secondary | ICD-10-CM

## 2024-07-17 DIAGNOSIS — M85832 Other specified disorders of bone density and structure, left forearm: Secondary | ICD-10-CM | POA: Diagnosis not present

## 2024-07-17 DIAGNOSIS — Z1732 Human epidermal growth factor receptor 2 negative status: Secondary | ICD-10-CM | POA: Diagnosis not present

## 2024-07-17 DIAGNOSIS — Z803 Family history of malignant neoplasm of breast: Secondary | ICD-10-CM

## 2024-07-17 DIAGNOSIS — Z801 Family history of malignant neoplasm of trachea, bronchus and lung: Secondary | ICD-10-CM | POA: Diagnosis not present

## 2024-07-17 DIAGNOSIS — Z170 Estrogen receptor positive status [ER+]: Secondary | ICD-10-CM | POA: Diagnosis not present

## 2024-07-17 NOTE — Progress Notes (Signed)
 [facility] Cancer Follow up:    [name] LABOR, DO [address] [place] [address]   DIAGNOSIS:  Cancer Staging  Malignant neoplasm of upper-outer quadrant of right breast in female, estrogen receptor positive (HCC) Staging form: Breast, AJCC 8th Edition - Clinical: Stage IA (cT1b, cN0, cM0, G2, ER+, PR+, HER2-) - Signed by Odean Potts, MD on 02/02/2022 Stage prefix: Initial diagnosis Histologic grading system: 3 grade system   I connected with [name] on 07/18/24 at 10:00 AM EDT by telephone and verified that I am speaking with the correct person using two identifiers.  I discussed the limitations, risks, security and privacy concerns of performing an evaluation and management service by telephone and the availability of in person appointments.  I also discussed with the patient that there may be a patient responsible charge related to this service. The patient expressed understanding and agreed to proceed.   Patient location: home Provider location: [facility] office  SUMMARY OF ONCOLOGIC HISTORY: Oncology History  Malignant neoplasm of upper-outer quadrant of right breast in female, estrogen receptor positive (HCC)  02/01/2022 Initial Diagnosis   Screening mammogram detected 0.6 cm right breast mass.  By ultrasound it measured 0.4 cm.  Stereotactic biopsy revealed grade 2 IDC and intermediate grade DCIS ER 100%, PR 100%, HER2 negative, Ki-67 10%   02/02/2022 Cancer Staging   Staging form: Breast, AJCC 8th Edition - Clinical: Stage IA (cT1b, cN0, cM0, G2, ER+, PR+, HER2-) - Signed by Odean Potts, MD on 02/02/2022 Stage prefix: Initial diagnosis Histologic grading system: 3 grade system   02/24/2022 Surgery   02/24/2022: Right lumpectomy: Grade 2 IDC 8 mm (2 contiguous blocks), intermediate grade DCIS, margins negative, ER 100%, PR 100%, HER2 negative, Ki-67 10%   03/15/2022 -  Anti-estrogen oral therapy   Anastrozole  x  5 years     CURRENT THERAPY:  Anastrozole   INTERVAL HISTORY:  Discussed the use of AI scribe software for clinical note transcription with the patient, who gave verbal consent to proceed.  History of Present Illness [name] is a 78 year old female with osteoporosis who presents for follow-up on recent bone density testing results.  Her recent bone density test on June 27, 2024, shows a T-score of -2.0 in the forearm, raising concerns about fracture risk. She has a history of stage one A invasive ductal carcinoma, ERPR positive, diagnosed in March 2023. She underwent a lumpectomy and has been on anastrozole  since April 2023. Previously, she took vitamin D  supplements along with calcium  with vitamin D  but stopped the additional vitamin D  three months ago. She does not use tobacco. Due to neuropathy and knee arthritis, she engages in low-impact exercises, walking around her house every 30 to 45 minutes and using a leg exercise machine.     Patient Active Problem List   Diagnosis Date Noted   Hepatic steatosis 07/04/2024   Prediabetes 07/02/2024   Glaucoma of both eyes 02/15/2024   Peripheral edema 02/15/2024   Mixed hyperlipidemia 02/15/2024   Malignant neoplasm of upper-outer quadrant of right breast in female, estrogen receptor positive (HCC) 02/01/2022   CAD (coronary artery disease) 01/22/2020   Family history of heart disease 12/06/2019   Primary hypertension 04/01/2019   Gastroesophageal reflux disease with esophagitis 04/01/2019   Vitamin D  deficiency 12/03/2018   Atherosclerosis of aorta (HCC) by CT scan in 2019 08/06/2018   Osteoarthritis of knee 06/12/2018   Chronic venous insufficiency 06/02/2015   Peripheral autonomic neuropathy of unknown  cause 06/02/2015   Morbid obesity (HCC) 08/19/2014   Hypothyroidism    History of tobacco use 04/09/2012   Diverticulosis of large intestine 05/04/2009    is allergic to procaine hcl, ciprofloxacin , macrobid  [nitrofurantoin  macrocrystal], mupirocin ,  sulfonamide derivatives, tetanus toxoid, tuberculin ppd, latex, and penicillins.  MEDICAL HISTORY: Past Medical History:  Diagnosis Date   Allergy    Anemia    Arthritis    Basal cell carcinoma    [facility] dermatology   Cataract    Chest tightness    Chronic kidney disease    Fatigue    GERD (gastroesophageal reflux disease)    Glaucoma    Hammertoe of right foot 05/26/2023   Heart palpitations 12/06/2019   History of basal cell carcinoma (BCC) 08/10/2017   [facility]     Hyperlipidemia    Hypothyroid    Internal hemorrhoids 05/04/2009   Qualifier: Diagnosis of  By: Nelson-Smith CMA (AAMA), Dottie     Melanoma [facility]) 2013   Excision/surgery only. No other treatment   Myalgia    Neuromuscular disorder (HCC)    Neuropathy   Obesity    Prediabetes    Ulcer    Varicose veins     SURGICAL HISTORY: Past Surgical History:  Procedure Laterality Date   ABDOMINAL HYSTERECTOMY     Total    BREAST LUMPECTOMY WITH RADIOACTIVE SEED LOCALIZATION Right 02/24/2022   Procedure: RIGHT BREAST LUMPECTOMY WITH RADIOACTIVE SEED LOCALIZATION;  Surgeon: Vernetta Berg, MD;  Location: [facility] OR;  Service: General;  Laterality: Right;   BREAST SURGERY     CATARACT EXTRACTION Bilateral    Dr. [place]   CYST REMOVAL HAND Left 07/30/2018   EYE SURGERY     NASAL SINUS SURGERY     ROTATOR CUFF REPAIR Left more than 7 years ago   Dr. Beverley and Dr. 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  Widow.     Social Drivers of Corporate Investment Banker Strain: Low Risk  (05/29/2024)   Overall Financial Resource Strain (CARDIA)    Difficulty of Paying Living Expenses: Not very hard  Food Insecurity: No Food Insecurity (05/29/2024)   Hunger Vital Sign    Worried About Running Out of Food in the Last Year: Never true    Ran Out of Food in the Last Year: Never true  Transportation Needs: No Transportation Needs (05/29/2024)   PRAPARE - Administrator, Civil Service (Medical): No    Lack of Transportation (Non-Medical): No  Physical Activity: Inactive (  05/29/2024)   Exercise Vital Sign    Days of Exercise per Week: 0 days    Minutes of Exercise per Session: Not on file  Stress: No Stress Concern Present (05/29/2024)   [employer] of Occupational Health - Occupational Stress Questionnaire    Feeling of Stress: Not at all  Social Connections: Moderately Isolated (05/29/2024)   Social Connection and Isolation Panel    Frequency of Communication with Friends and Family: More than three times a week    Frequency of Social Gatherings with Friends and Family: Twice a week    Attends Religious Services: More than 4 times per year    Active Member of [employer] or Organizations: No    Attends Banker Meetings: Not on file    Marital Status: Widowed  Catering Manager Violence: Not on file    FAMILY HISTORY: Family History  Problem Relation Age of Onset   Dementia Mother    Lung cancer Mother        dx 27s; smoking hx   Cancer Mother    Varicose Veins Mother    Coronary artery disease Father 71   Heart disease Father    Lung cancer Maternal Grandmother         non-smoker; dx 6s   Cancer Maternal Grandmother    Dementia Maternal Aunt    Breast cancer Cousin        maternal female cousin; dx 48s    Review of Systems  Constitutional:  Negative for appetite change, chills, fatigue, fever and unexpected weight change.  HENT:   Negative for hearing loss, lump/mass and trouble swallowing.   Eyes:  Negative for eye problems and icterus.  Respiratory:  Negative for chest tightness, cough and shortness of breath.   Cardiovascular:  Negative for chest pain, leg swelling and palpitations.  Gastrointestinal:  Negative for abdominal distention, abdominal pain, constipation, diarrhea, nausea and vomiting.  Endocrine: Negative for hot flashes.  Genitourinary:  Negative for difficulty urinating.   Musculoskeletal:  Negative for arthralgias.  Skin:  Negative for itching and rash.  Neurological:  Negative for dizziness, extremity weakness, headaches and numbness.  Hematological:  Negative for adenopathy. Does not bruise/bleed easily.  Psychiatric/Behavioral:  Negative for depression. The patient is not nervous/anxious.     PHYSICAL EXAMINATION Patient sounds well.  She is in no apparent distress, mood and behavior are normal.    ASSESSMENT and THERAPY PLAN:   Malignant neoplasm of upper-outer quadrant of right breast in female, estrogen receptor positive (HCC) 02/24/2022: Right lumpectomy: Grade 2 IDC 8 mm (2 contiguous blocks), intermediate grade DCIS, margins negative, ER 100%, PR 100%, HER2 negative, Ki-67 10%    Treatment plan: 1.  Given the small size and good prognostic profile, we are not doing Oncotype DX. 2. patient chose not to do adjuvant radiation 3. Adjuvant antiestrogen therapy: Anastrozole  started 03/15/2022    Assessment and Plan Assessment & Plan Osteopenia Osteopenia in forearm with T-score of -2.0. Anastrozole  contributes to bone density loss. Discussed importance of bone density maintenance and potential bisphosphonate therapy with  associated risks. - Continue calcium  with vitamin D  supplementation. - Recheck vitamin D  level during primary care visit in January. - Encourage weight-bearing exercises as tolerated. - Repeat bone density test in August 2027.  History of stage IA ER/PR positive invasive ductal carcinoma of breast, status post lumpectomy, on anastrozole  Stage IA ER/PR positive invasive ductal carcinoma, post-lumpectomy, on anastrozole  since April 2023, tolerating well - Continue anastrozole . - Schedule mammogram in April.  RTC in 04/2025 as scheduled with Dr. Odean    Follow up instructions:    -Return to cancer center 04/2026 for f/u with Dr. Odean  -Mammogram due in 02/2025 ([facility]) -Repeat DEXA in 06/2026([facility]) -Repeat Vitamin D  level in January with PCP   The patient was provided an opportunity to ask questions and all were answered. The patient agreed with the plan and demonstrated an understanding of the instructions.   The patient was advised to call back or seek an in-person evaluation if the symptoms worsen or if the condition fails to improve as anticipated.   I provided 10 minutes of non face-to-face telephone visit time during this encounter, and > 50% was spent counseling as documented under my assessment & plan.   Morna Kendall, NP 07/18/24 2:17 PM Medical Oncology and Hematology [facility] [address] [place], [place] [address] Tel. [phone]    Fax. [phone]  *Total Encounter Time as defined by the Centers for Medicare and Medicaid Services includes, in addition to the face-to-face time of a patient visit (documented in the note above) non-face-to-face time: obtaining and reviewing outside history, ordering and reviewing medications, tests or procedures, care coordination (communications with other health care professionals or caregivers) and documentation in the medical record.

## 2024-07-17 NOTE — Patient Instructions (Signed)
 Bone Health Bones protect organs, store calcium, anchor muscles, and support the whole body. Keeping your bones strong is important, especially as you get older. You can take actions to help keep your bones strong and healthy. Why is keeping my bones healthy important?  Keeping your bones healthy is important because your body constantly replaces bone cells. Cells get old, and new cells take their place. As we age, we lose bone cells because the body may not be able to make enough new cells to replace the old cells. The amount of bone cells and bone tissue you have is referred to as bone mass. The higher your bone mass, the stronger your bones. The aging process leads to an overall loss of bone mass in the body, which can increase the likelihood of: Broken bones. A condition in which the bones become weak and brittle (osteoporosis). A large decline in bone mass occurs in older adults. In women, it occurs about the time of menopause. What actions can I take to keep my bones healthy? Good health habits are important for maintaining healthy bones. This includes eating nutritious foods and exercising regularly. To have healthy bones, you need to get enough of the right minerals and vitamins. Most nutrition experts recommend getting these nutrients from the foods that you eat. In some cases, taking supplements may also be recommended. Doing certain types of exercise is also important for bone health. What are the nutritional recommendations for healthy bones?  Eating a well-balanced diet with plenty of calcium and vitamin D will help to protect your bones. Nutritional recommendations vary from person to person. Ask your health care provider what is healthy for you. Here are some general guidelines. Get enough calcium Calcium is the most important (essential) mineral for bone health. Most people can get enough calcium from their diet, but supplements may be recommended for people who are at risk for  osteoporosis. Good sources of calcium include: Dairy products, such as low-fat or nonfat milk, cheese, and yogurt. Dark green leafy vegetables, such as bok choy and broccoli. Foods that have calcium added to them (are fortified). Foods that may be fortified with calcium include orange juice, cereal, bread, soy beverages, and tofu products. Nuts, such as almonds. Follow these recommended amounts for daily calcium intake: Infants, 0-6 months: 200 mg. Infants, 6-12 months: 260 mg. Children, age 647-3: 700 mg. Children, age 64-8: 1,000 mg. Children, age 642-13: 1,300 mg. Teens, age 38-18: 1,300 mg. Adults, age 76-50: 1,000 mg. Adults, age 23-70: Men: 1,000 mg. Women: 1,200 mg. Adults, age 97 or older: 1,200 mg. Pregnant and breastfeeding females: Teens: 1,300 mg. Adults: 1,000 mg. Get enough vitamin D Vitamin D is the most essential vitamin for bone health. It helps the body absorb calcium. Sunlight stimulates the skin to make vitamin D, so be sure to get enough sunlight. If you live in a cold climate or you do not get outside often, your health care provider may recommend that you take vitamin D supplements. Good sources of vitamin D in your diet include: Egg yolks. Saltwater fish. Milk and cereal fortified with vitamin D. Follow these recommended amounts for daily vitamin D intake: Infants, 0-12 months: 400 international units (IU). Children and teens, age 647-18: 600 international units. Adults, age 31 or younger: 600 international units. Adults, age 9 or older: 600-1,000 international units. Get other important nutrients Other nutrients that are important for bone health include: Phosphorus. This mineral is found in meat, poultry, dairy foods, nuts, and legumes. The  recommended daily intake for adult men and adult women is 700 mg. Magnesium. This mineral is found in seeds, nuts, dark green vegetables, and legumes. The recommended daily intake for adult men is 400-420 mg. For adult women,  it is 310-320 mg. Vitamin K. This vitamin is found in green leafy vegetables. The recommended daily intake is 120 mcg for adult men and 90 mcg for adult women. What type of physical activity is best for building and maintaining healthy bones? Weight-bearing and strength-building activities are important for building and maintaining healthy bones. Weight-bearing activities cause muscles and bones to work against gravity. Strength-building activities increase the strength of the muscles that support bones. Weight-bearing and muscle-building activities include: Walking and hiking. Jogging and running. Dancing. Gym exercises. Lifting weights. Tennis and racquetball. Climbing stairs. Aerobics. Adults should get at least 30 minutes of moderate physical activity on most days. Children should get at least 60 minutes of moderate physical activity on most days. Ask your health care provider what type of exercise is best for you. How can I find out if my bone mass is low? Bone mass can be measured with an X-ray test called a bone mineral density (BMD) test. This test is recommended for all women who are age 19 or older. It may also be recommended for: Men who are age 55 or older. People who are at risk for osteoporosis because of: Having a long-term disease that weakens bones, such as kidney disease or rheumatoid arthritis. Having menopause earlier than normal. Taking medicine that weakens bones, such as steroids, thyroid hormones, or hormone treatment for breast cancer or prostate cancer. Smoking. Drinking three or more alcoholic drinks a day. Being underweight. Sedentary lifestyle. If you find that you have a low bone mass, you may be able to prevent osteoporosis or further bone loss by changing your diet and lifestyle. Where can I find more information? Bone Health & Osteoporosis Foundation: https://carlson-fletcher.info/ [employer] of [employer]: www.bones.http://www.myers.net/ International Osteoporosis  Foundation: Investment banker, operational.iofbonehealth.org Summary The aging process leads to an overall loss of bone mass in the body, which can increase the likelihood of broken bones and osteoporosis. Eating a well-balanced diet with plenty of calcium and vitamin D will help to protect your bones. Weight-bearing and strength-building activities are also important for building and maintaining strong bones. Bone mass can be measured with an X-ray test called a bone mineral density (BMD) test. This information is not intended to replace advice given to you by your health care provider. Make sure you discuss any questions you have with your health care provider. Document Revised: 04/21/2021 Document Reviewed: 04/21/2021 Elsevier Patient Education  2024 [employer].

## 2024-07-18 ENCOUNTER — Telehealth: Payer: Self-pay | Admitting: Podiatry

## 2024-07-18 NOTE — Assessment & Plan Note (Signed)
 02/24/2022: Right lumpectomy: Grade 2 IDC 8 mm (2 contiguous blocks), intermediate grade DCIS, margins negative, ER 100%, PR 100%, HER2 negative, Ki-67 10%    Treatment plan: 1.  Given the small size and good prognostic profile, we are not doing Oncotype DX. 2. patient chose not to do adjuvant radiation 3. Adjuvant antiestrogen therapy: Anastrozole  started 03/15/2022    Assessment and Plan Assessment & Plan Osteopenia Osteopenia in forearm with T-score of -2.0. Anastrozole  contributes to bone density loss. Discussed importance of bone density maintenance and potential bisphosphonate therapy with associated risks. - Continue calcium  with vitamin D  supplementation. - Recheck vitamin D  level during primary care visit in January. - Encourage weight-bearing exercises as tolerated. - Repeat bone density test in August 2027.  History of stage IA ER/PR positive invasive ductal carcinoma of breast, status post lumpectomy, on anastrozole  Stage IA ER/PR positive invasive ductal carcinoma, post-lumpectomy, on anastrozole  since April 2023, tolerating well - Continue anastrozole . - Schedule mammogram in April.  RTC in 04/2025 as scheduled with Dr. Gudena

## 2024-07-18 NOTE — Telephone Encounter (Signed)
 DOS- 08/12/2024  TENOTOMY SINGLE 4TH+5TH LT- 28010  [employer] EFFECTIVE DATE- 11/22/2023  DEDUCTIBLE- 11/22/2023 OOP- $3400 ACCUMULATED- $256.92 COINSURANCE- $325 COPAY  PER BRIANA WITH [employer], NO PRIOR AUTHORIZATION IS REQUIRED FOR CPT 28010 (2 UNITS). REF# [facility] 07/18/2024 4:42 PM

## 2024-07-19 ENCOUNTER — Other Ambulatory Visit: Payer: Self-pay

## 2024-07-19 ENCOUNTER — Other Ambulatory Visit (HOSPITAL_COMMUNITY): Payer: Self-pay

## 2024-07-24 ENCOUNTER — Encounter (HOSPITAL_BASED_OUTPATIENT_CLINIC_OR_DEPARTMENT_OTHER): Payer: Self-pay

## 2024-07-24 ENCOUNTER — Ambulatory Visit (HOSPITAL_BASED_OUTPATIENT_CLINIC_OR_DEPARTMENT_OTHER)

## 2024-07-24 ENCOUNTER — Telehealth: Payer: Self-pay | Admitting: Cardiology

## 2024-07-24 NOTE — Telephone Encounter (Signed)
 Pt called in to cancel her nurse visit today because she is sick. She would like to r/s for next week

## 2024-07-24 NOTE — Telephone Encounter (Signed)
 Spoke with patient and let her know we had her rescheduled for 07/30/24 at 1pm.  She can not make that appointment.  We rescheduled to 08/05/24 at 1pm.

## 2024-07-30 ENCOUNTER — Ambulatory Visit (HOSPITAL_BASED_OUTPATIENT_CLINIC_OR_DEPARTMENT_OTHER)

## 2024-08-01 ENCOUNTER — Telehealth: Payer: Self-pay | Admitting: Podiatry

## 2024-08-01 NOTE — Telephone Encounter (Signed)
 Pt called and canceled her office surgery for 9/22 at 730 as she is having BP issues and will call to r/s.  I am canceling the pov as well and pt is aware.

## 2024-08-05 ENCOUNTER — Ambulatory Visit (HOSPITAL_BASED_OUTPATIENT_CLINIC_OR_DEPARTMENT_OTHER): Admitting: *Deleted

## 2024-08-05 DIAGNOSIS — I10 Essential (primary) hypertension: Secondary | ICD-10-CM | POA: Diagnosis not present

## 2024-08-05 MED ORDER — FUROSEMIDE 40 MG PO TABS
ORAL_TABLET | ORAL | Status: AC
Start: 2024-08-05 — End: ?

## 2024-08-05 NOTE — Progress Notes (Signed)
   Nurse Visit   Date of Encounter: 08/05/2024 ID: [name], DOB Apr 03, 1946, MRN [id number]  PCP:  [name] Charlies LABOR, DO    [facility] Providers Cardiologist:  Shelda Bruckner, MD      Visit Details   VS:  There were no vitals taken for this visit. , BMI There is no height or weight on file to calculate BMI.  Wt Readings from Last 3 Encounters:  07/16/24 227 lb 8 oz (103.2 kg)  07/02/24 222 lb (100.7 kg)  05/29/24 226 lb 6.4 oz (102.7 kg)     Reason for visit: blood pressure check  Performed today: Vitals Patient here for blood pressure check. Was take off Furosemide  at last visit for blood pressure being low. Started having lower extremity swelling and pain. Resumed the Lasix  40 mg one daily, swelling resolve. Blood pressure with her machine 98/64, our machine 96/61, manually 90/50. Patient feels fine, no dizziness or lightheadedness. Discussed with Dr Bruckner, use lasix  as infrequent as possible and try compression stockings.  Changes (medications, testing, etc.) : USE LASIX  AS INFREQUENT AS POSSIBLE  Length of Visit: 45 minutes    Medications Adjustments/Labs and Tests Ordered: No orders of the defined types were placed in this encounter.  No orders of the defined types were placed in this encounter.    Bonney Newell Birk, LPN  0/84/7974 8:58 PM

## 2024-08-05 NOTE — Patient Instructions (Signed)
 Use your Furosemide  as infrequently as possible  Call the office if you start to feel lightheaded or dizzy  Continue to monitor your blood pressure, call if you are consistently getting readings in the 90's   Keep follow up as scheduled   To prevent or reduce lower extremity swelling: Eat a low salt diet. Salt makes the body hold onto extra fluid which causes swelling. Sit with legs elevated. For example, in the recliner or on an ottoman.  Wear knee-high compression stockings during the daytime. Ones labeled 15-20 mmHg provide good compression.

## 2024-08-15 ENCOUNTER — Other Ambulatory Visit (HOSPITAL_COMMUNITY): Payer: Self-pay

## 2024-08-19 ENCOUNTER — Ambulatory Visit (HOSPITAL_BASED_OUTPATIENT_CLINIC_OR_DEPARTMENT_OTHER): Admitting: Cardiology

## 2024-08-19 ENCOUNTER — Encounter: Admitting: Podiatry

## 2024-08-19 NOTE — Progress Notes (Signed)
 Cardiology Office Note   Date:  08/26/2024  ID:  [name], DOB 02-Nov-1946, MRN [id number] PCP: [name] Charlies LABOR, DO  [facility] Providers Cardiologist:  Shelda Bruckner, MD     PMH Nonobstructive CAD Type 2 diabetes mellitus Hypertension Hyperlipidemia GERD Obesity Bilateral leg edema  Previously seen by Dr. Lavona, she established care with Dr. Bruckner 11/2019.  Coronary CTA revealed calcium  score of 693 (78th percentile), moderate stenosis in proximal LAD, distal RCA, proximal LCx, not hemodynamically significant by CT FFR.  She had a lumpectomy in April 2023 that was treated with anastrozole .   Last cardiology clinic visit was 07/16/2024 with Dr. Bruckner.  Her blood pressure was running low but she was asymptomatic.  She reported normal nutrition and hydration, walking without dizziness.  She was taking Lasix  once daily with permission to take a second dose for swelling which she had not done in a long time.  Chronic leg edema felt to be consistent with venous insufficiency.  She was asked to hold Lasix  for hypotension and return in 1 week for nurse visit.  LDL was well-controlled at 44 on 01/2024.  At nurse visit on 08/05/2024 she had resumed Lasix  due to lower extremity swelling and pain at 40 mg daily. Swelling resolved.  Manual BP 90/50, consistent with her home reading of 98/64; she remained asymptomatic.  She was asked to use compression stockings and use Lasix  as infrequently as possible.   History of Present Illness Discussed the use of AI scribe software for clinical note transcription with the patient, who gave verbal consent to proceed.  History of Present Illness [name] is a very pleasant 78 year old female who presents for follow-up of chronic leg swelling and blood pressure management. She experiences chronic leg swelling for many years. Reports it worsens in the summer and improves in the winter. The swelling causes significant  discomfort without furosemide , making her legs feel 'hard' and 'like they're going to explode.' Furosemide  every three days has been working for her. She has difficulty with compression stockings but finds diabetic socks helpful. She elevates her legs using a lift chair and an adjustable bed. She denies pain, dyspnea, orthopnea, PND, presyncope, syncope.  She maintains activity with walking around her house.  No regular exercise. Family history includes heart disease; her father died of a massive coronary at 47. Her mother passed away from lung cancer and Alzheimer's at 89.  ROS: See HPI  Studies Reviewed       No results found for: LIPOA  Risk Assessment/Calculations           Physical Exam VS:  BP 122/68   Pulse (!) 54   Ht 5' 6.75 (1.695 m)   Wt 227 lb 3.2 oz (103.1 kg)   SpO2 93%   BMI 35.85 kg/m    Wt Readings from Last 3 Encounters:  08/26/24 227 lb 3.2 oz (103.1 kg)  08/05/24 227 lb (103 kg)  07/16/24 227 lb 8 oz (103.2 kg)    GEN: Well nourished, well developed in no acute distress NECK: No JVD; No carotid bruits CARDIAC: RRR, no murmurs, rubs, gallops RESPIRATORY:  Clear to auscultation without rales, wheezing or rhonchi  ABDOMEN: Soft, non-tender, non-distended EXTREMITIES:  No edema; No deformity    Assessment & Plan Chronic lower extremity edema  Ideopathic hypertension  Occasional hypotension Longstanding bilateral leg edema has improved with furosemide  40 mg every 3 days. She reports no dyspnea, chest pain, or orthopnea.  No indication  for further testing of heart function at this time.  Compression stockings are not used due to difficulty getting them on, but diabetic socks provide relief. Blood pressure is well controlled with no significant low BP readings from home.  No symptoms of orthostatic hypotension.  -We will get BMET today for monitoring of kidney function and electrolytes -Continue furosemide  every three days as tolerated  -Consider echo if she  develops symptoms concerning for heart failure - Continue use of diabetic socks and elevation for edema management  Weight management  Obesity She is interested in weight management medications. Discussed the cardiovascular benefits of weight loss. Insurance coverage may be limited for non-diabetics, but there are no contraindications with current medications. No history of medullary thyroid  cancer.  -Advised on maintaining muscle mass during weight loss using strengthening exercises, hand weights, pedal exerciser, squats, etc. -Check insurance coverage for Zepbound (Tirzepatide) - If affordable, initiate Zepbound at a starting dose of 2.5 mg weekly - Monitor progress and adjust dosage as needed   Cardiac risk Nonobstructive CAD Hyperlipidemia LDL goal < 70 Three-vessel coronary artery disease without significant stenosis on CT 12/2019. She denies chest pain, dyspnea, or other symptoms concerning for angina.  No indication for further ischemic evaluation at this time.  As noted above, we discussed the role of GLP-1 agonist in cardiovascular risk reduction.  LDL well-controlled on labs completed 02/15/2024. - Continue aspirin, ezetimibe , atorvastatin  - Continue heart healthy, mostly whole food diet avoiding processed food, saturated fat, sugar, and other simple carbohydrates        Dispo: 6 months with Dr. Lonni or APP  Signed, Rosaline Bane, NP-C

## 2024-08-26 ENCOUNTER — Encounter (HOSPITAL_BASED_OUTPATIENT_CLINIC_OR_DEPARTMENT_OTHER): Payer: Self-pay | Admitting: Nurse Practitioner

## 2024-08-26 ENCOUNTER — Ambulatory Visit (HOSPITAL_BASED_OUTPATIENT_CLINIC_OR_DEPARTMENT_OTHER): Admitting: Nurse Practitioner

## 2024-08-26 VITALS — BP 122/68 | HR 54 | Ht 66.75 in | Wt 227.2 lb

## 2024-08-26 DIAGNOSIS — I10 Essential (primary) hypertension: Secondary | ICD-10-CM

## 2024-08-26 DIAGNOSIS — Z7189 Other specified counseling: Secondary | ICD-10-CM

## 2024-08-26 DIAGNOSIS — E785 Hyperlipidemia, unspecified: Secondary | ICD-10-CM | POA: Diagnosis not present

## 2024-08-26 DIAGNOSIS — R600 Localized edema: Secondary | ICD-10-CM | POA: Diagnosis not present

## 2024-08-26 DIAGNOSIS — I872 Venous insufficiency (chronic) (peripheral): Secondary | ICD-10-CM

## 2024-08-26 DIAGNOSIS — Z789 Other specified health status: Secondary | ICD-10-CM

## 2024-08-26 DIAGNOSIS — I2510 Atherosclerotic heart disease of native coronary artery without angina pectoris: Secondary | ICD-10-CM | POA: Diagnosis not present

## 2024-08-26 DIAGNOSIS — I950 Idiopathic hypotension: Secondary | ICD-10-CM | POA: Diagnosis not present

## 2024-08-26 DIAGNOSIS — E7800 Pure hypercholesterolemia, unspecified: Secondary | ICD-10-CM | POA: Diagnosis not present

## 2024-08-26 MED ORDER — ZEPBOUND 2.5 MG/0.5ML ~~LOC~~ SOAJ: 2.5000 mg | SUBCUTANEOUS | 0 refills | Status: AC

## 2024-08-26 NOTE — Patient Instructions (Signed)
 Medication Instructions:   Your physician recommends that you continue on your current medications as directed. Please refer to the Current Medication list given to you today.   *If you need a refill on your cardiac medications before your next appointment, please call your pharmacy*  Lab Work:  TODAY!!! BMET  If you have labs (blood work) drawn today and your tests are completely normal, you will receive your results only by: MyChart Message (if you have MyChart) OR A paper copy in the mail If you have any lab test that is abnormal or we need to change your treatment, we will call you to review the results.  Testing/Procedures:  None ordered.  Follow-Up: At [facility], you and your health needs are our priority.  As part of our continuing mission to provide you with exceptional heart care, our providers are all part of one team.  This team includes your primary Cardiologist (physician) and Advanced Practice Providers or APPs (Physician Assistants and Nurse Practitioners) who all work together to provide you with the care you need, when you need it.  Your next appointment:   6 month(s)  Provider:   Shelda Bruckner, MD, Rosaline Bane, NP, or Reche Finder, NP    We recommend signing up for the patient portal called MyChart.  Sign up information is provided on this After Visit Summary.  MyChart is used to connect with patients for Virtual Visits (Telemedicine).  Patients are able to view lab/test results, encounter notes, upcoming appointments, etc.  Non-urgent messages can be sent to your provider as well.   To learn more about what you can do with MyChart, go to [email or web address].   Other Instructions  Your physician wants you to follow-up in: 6 months.  You will receive a reminder letter in the mail two months in advance. If you don't receive a letter, please call our office to schedule the follow-up appointment.

## 2024-08-27 ENCOUNTER — Ambulatory Visit: Admitting: Podiatry

## 2024-08-27 ENCOUNTER — Other Ambulatory Visit (HOSPITAL_COMMUNITY): Payer: Self-pay

## 2024-08-27 ENCOUNTER — Other Ambulatory Visit: Payer: Self-pay

## 2024-08-27 ENCOUNTER — Encounter: Payer: Self-pay | Admitting: Podiatry

## 2024-08-27 ENCOUNTER — Ambulatory Visit: Payer: Self-pay | Admitting: Nurse Practitioner

## 2024-08-27 DIAGNOSIS — L84 Corns and callosities: Secondary | ICD-10-CM | POA: Diagnosis not present

## 2024-08-27 DIAGNOSIS — E1142 Type 2 diabetes mellitus with diabetic polyneuropathy: Secondary | ICD-10-CM

## 2024-08-27 LAB — BASIC METABOLIC PANEL WITH GFR
BUN/Creatinine Ratio: 14 (ref 12–28)
BUN: 16 mg/dL (ref 8–27)
CO2: 24 mmol/L (ref 20–29)
Calcium: 9.4 mg/dL (ref 8.7–10.3)
Chloride: 103 mmol/L (ref 96–106)
Creatinine, Ser: 1.15 mg/dL — ABNORMAL HIGH (ref 0.57–1.00)
Glucose: 80 mg/dL (ref 70–99)
Potassium: 4.5 mmol/L (ref 3.5–5.2)
Sodium: 142 mmol/L (ref 134–144)
eGFR: 49 mL/min/1.73 — ABNORMAL LOW (ref >59)

## 2024-08-28 ENCOUNTER — Other Ambulatory Visit (HOSPITAL_COMMUNITY): Payer: Self-pay

## 2024-08-28 ENCOUNTER — Telehealth (HOSPITAL_BASED_OUTPATIENT_CLINIC_OR_DEPARTMENT_OTHER): Payer: Self-pay | Admitting: *Deleted

## 2024-08-28 NOTE — Telephone Encounter (Signed)
 Lvm for pt zepbound was not approved. Pt can call or send mychart message.

## 2024-08-28 NOTE — Telephone Encounter (Signed)
 Patient was seen by Rosaline RAMAN NP 10/6 who wants to start Zepbound if covered by insurance   Will forward to RX PA team for review

## 2024-08-28 NOTE — Telephone Encounter (Signed)
 Pharmacy Patient Advocate Encounter   Received notification from Physician's Office that prior authorization for ZEPBOUND is required/requested.   Insurance verification completed.   The patient is insured through [facility] ADVANTAGE/RX ADVANCE.   Per test claim: Per test claim, medication is not covered due to plan/benefit exclusion, PA not submitted at this time

## 2024-08-28 NOTE — Telephone Encounter (Signed)
 Will forward to Eula Fried NP for review

## 2024-09-01 NOTE — Progress Notes (Signed)
 Subjective:  Patient ID: [name], female    DOB: 1946-01-21,  MRN: [id number]  [name] presents to clinic today for at risk foot care with history of diabetic neuropathy and preulcerative lesion(s) left foot and painful mycotic toenails that limit ambulation. Painful toenails interfere with ambulation. Aggravating factors include wearing enclosed shoe gear. Pain is relieved with periodic professional debridement. Painful preulcerative lesion(s) is/are aggravated when weightbearing with and without shoegear. Pain is relieved with periodic professional debridement.  Chief Complaint  Patient presents with   Diabetes    She cuts my toenails and gets the dead skin off my toes.  Saw Dr. Charlies Bellini - 07/02/2024; A1c - 6.1   New problem(s): None.   PCP is Kuneff, Renee A, DO.  Allergies  Allergen Reactions   Procaine Hcl Shortness Of Breath    Ok to use sq lidocaine  for iv starts   Ciprofloxacin      Unknown reaction   Macrobid  [Nitrofurantoin  Macrocrystal]     Unknown reaction   Mupirocin  Dermatitis    Patient states toe turned red after applying medication.   Sulfonamide Derivatives Hives   Tetanus Toxoid Swelling    REACTION: arm swells   Tuberculin Ppd Swelling   Latex Rash   Penicillins Rash    Review of Systems: Negative except as noted in the HPI.  Objective: No changes noted in today's physical examination. There were no vitals filed for this visit. [name] is a pleasant 78 y.o. female obese in NAD. AAO x 3.  Vascular Examination: Capillary refill time immediate b/l. Vascular status intact b/l with palpable pedal pulses. Pedal hair present b/l. No pain with calf compression b/l. Skin temperature gradient WNL b/l. No cyanosis or clubbing b/l. No ischemia or gangrene noted b/l. +1 pitting edema noted BLE. Varicosities present b/l.  Neurological Examination: Sensation grossly intact b/l with 10 gram monofilament. Vibratory sensation intact b/l. Pt has  subjective symptoms of neuropathy.  Dermatological Examination: Preulcerative lesion distal tip left 4th toe and distal tip left great toe with subcutaneous hemorrhage. No erythema, no edema, no drainage.  No underlying fluctuance.  Pedal skin with normal turgor, texture and tone b/l.  No open wounds. No interdigital macerations.   Toenails 1-5 b/l thick, discolored, elongated with subungual debris and pain on dorsal palpation.   Musculoskeletal Examination: Muscle strength 5/5 to all lower extremity muscle groups bilaterally. HAV with bunion deformity noted b/l LE. Hammertoe deformity noted 2, 3 b/l and adductovarus 4th and 5th digits b/l.  Assessment/Plan: 1. Pre-ulcerative corn or callous   2. Diabetic peripheral neuropathy associated with type 2 diabetes mellitus (HCC)   -Consent given for treatment as described below: -Examined patient. -Patient to continue soft, supportive shoe gear daily. -Toenails 1-5 b/l were debrided in length and girth with sterile nail nippers and dremel without iatrogenic bleeding.  -Preulcerative lesion pared distal tip of left great toe and distal tip of left 4th toe utilizing sterile scalpel blade. Total number pared=2. -Patient/POA to call should there be question/concern in the interim.   Return in about 9 weeks (around 10/29/2024).  [name], DPM      [place] LOCATION: [address], [place] [address]  Office [phone]   [facility] LOCATION: [address] [place], [place] [address] Office [phone]

## 2024-09-19 ENCOUNTER — Other Ambulatory Visit: Payer: Self-pay

## 2024-10-03 ENCOUNTER — Other Ambulatory Visit (HOSPITAL_COMMUNITY): Payer: Self-pay

## 2024-10-04 DIAGNOSIS — H401131 Primary open-angle glaucoma, bilateral, mild stage: Secondary | ICD-10-CM | POA: Diagnosis not present

## 2024-10-29 ENCOUNTER — Ambulatory Visit: Admitting: Podiatry

## 2024-11-06 ENCOUNTER — Ambulatory Visit

## 2024-11-06 VITALS — BP 124/67 | Ht 66.75 in | Wt 227.0 lb

## 2024-11-06 DIAGNOSIS — Z0000 Encounter for general adult medical examination without abnormal findings: Secondary | ICD-10-CM | POA: Diagnosis not present

## 2024-11-06 NOTE — Patient Instructions (Signed)
 Ms. Marlar,  Thank you for taking the time for your Medicare Wellness Visit. I appreciate your continued commitment to your health goals. Please review the care plan we discussed, and feel free to reach out if I can assist you further.  Please note that Annual Wellness Visits do not include a physical exam. Some assessments may be limited, especially if the visit was conducted virtually. If needed, we may recommend an in-person follow-up with your provider.  Ongoing Care Seeing your primary care provider every 3 to 6 months helps us  monitor your health and provide consistent, personalized care.   Referrals If a referral was made during today's visit and you haven't received any updates within two weeks, please contact the referred provider directly to check on the status.  Recommended Screenings:  Health Maintenance  Topic Date Due   Screening for Lung Cancer  06/30/2023   Eye exam for diabetics  01/24/2024   Medicare Annual Wellness Visit  02/13/2024   Yearly kidney health urinalysis for diabetes  11/08/2024   Flu Shot  02/18/2025*   Hemoglobin A1C  01/02/2025   Breast Cancer Screening  02/26/2025   Complete foot exam   04/23/2025   Yearly kidney function blood test for diabetes  08/26/2025   Osteoporosis screening with Bone Density Scan  06/27/2026   Pneumococcal Vaccine for age over 30  Completed   Hepatitis C Screening  Completed   Meningitis B Vaccine  Aged Out   DTaP/Tdap/Td vaccine  Discontinued   Colon Cancer Screening  Discontinued   COVID-19 Vaccine  Discontinued   Zoster (Shingles) Vaccine  Discontinued  *Topic was postponed. The date shown is not the original due date.       11/01/2024    6:52 PM  Advanced Directives  Does Patient Have a Medical Advance Directive? Yes  Type of Estate Agent of [place];Living will;Out of facility DNR (pink MOST or yellow form)  Does patient want to make changes to medical advance directive? No - Patient  declined  Copy of Healthcare Power of Attorney in Chart? No - copy requested    Vision: Annual vision screenings are recommended for early detection of glaucoma, cataracts, and diabetic retinopathy. These exams can also reveal signs of chronic conditions such as diabetes and high blood pressure.  Dental: Annual dental screenings help detect early signs of oral cancer, gum disease, and other conditions linked to overall health, including heart disease and diabetes.  Please see the attached documents for additional preventive care recommendations.

## 2024-11-06 NOTE — Progress Notes (Signed)
 I connected with  [name] on 11/06/2024 by a audio enabled telemedicine application and verified that I am speaking with the correct person using two identifiers.  Patient Location: Home  Provider Location: Home Office  Persons Participating in Visit: Patient.  I discussed the limitations of evaluation and management by telemedicine. The patient expressed understanding and agreed to proceed.  Vital Signs: Because this visit was a virtual/telehealth visit, some criteria may be missing or patient reported. Any vitals not documented were not able to be obtained and vitals that have been documented are patient reported.   This visit was performed by a medical professional under my direct supervision. I was immediately available for consultation/collaboration. I have reviewed and agree with the Annual Wellness Visit documentation.  Chief Complaint  Patient presents with   Medicare Wellness     Subjective:   [name] is a 78 y.o. female who presents for a Medicare Annual Wellness Visit.  Visit info / Clinical Intake: Medicare Wellness Visit Type:: Subsequent Annual Wellness Visit Persons participating in visit and providing information:: patient Medicare Wellness Visit Mode:: Telephone If telephone:: video declined Since this visit was completed virtually, some vitals may be partially provided or unavailable. Missing vitals are due to the limitations of the virtual format.: Documented vitals are patient reported If Telephone or Video please confirm:: I connected with patient using audio/video enable telemedicine. I verified patient identity with two identifiers, discussed telehealth limitations, and patient agreed to proceed. Patient Location:: home Provider Location:: home office Interpreter Needed?: No Pre-visit prep was completed: yes AWV questionnaire completed by patient prior to visit?: no Living arrangements:: (!) (Patient-Rptd) lives alone Patient's Overall Health  Status Rating: (Patient-Rptd) good Typical amount of pain: (Patient-Rptd) some Does pain affect daily life?: (Patient-Rptd) no Are you currently prescribed opioids?: no  Dietary Habits and Nutritional Risks How many meals a day?: (Patient-Rptd) 2 Eats fruit and vegetables daily?: yes Most meals are obtained by: (Patient-Rptd) preparing own meals In the last 2 weeks, have you had any of the following?: none Diabetic:: no  Functional Status Activities of Daily Living (to include ambulation/medication): (Patient-Rptd) Independent Ambulation: Independent with device- listed below Home Assistive Devices/Equipment: Cane Medication Administration: (Patient-Rptd) Independent Home Management (perform basic housework or laundry): (Patient-Rptd) Independent Manage your own finances?: (Patient-Rptd) yes Primary transportation is: (Patient-Rptd) driving Concerns about vision?: no *vision screening is required for WTM* Concerns about hearing?: no  Fall Screening Falls in the past year?: (Patient-Rptd) 0 Number of falls in past year: 0 Was there an injury with Fall?: 0 Fall Risk Category Calculator: 0 Patient Fall Risk Level: Low Fall Risk  Fall Risk Patient at Risk for Falls Due to: No Fall Risks Fall risk Follow up: Falls prevention discussed; Falls evaluation completed  Home and Transportation Safety: All rugs have non-skid backing?: (Patient-Rptd) yes All stairs or steps have railings?: (Patient-Rptd) N/A, no stairs Grab bars in the bathtub or shower?: (Patient-Rptd) yes Have non-skid surface in bathtub or shower?: (Patient-Rptd) yes Good home lighting?: (Patient-Rptd) yes Regular seat belt use?: (Patient-Rptd) yes Hospital stays in the last year:: (Patient-Rptd) no  Cognitive Assessment Difficulty concentrating, remembering, or making decisions? : (Patient-Rptd) no Will 6CIT or Mini Cog be Completed: yes What year is it?: 0 points What month is it?: 0 points Give patient an  address phrase to remember (5 components): remember words apple , table , penny About what time is it?: 0 points Count backwards from 20 to 1: 0 points Say the months of  the year in reverse: 0 points Repeat the address phrase from earlier: 0 points 6 CIT Score: 0 points  Advance Directives (For Healthcare) Does Patient Have a Medical Advance Directive?: Yes Does patient want to make changes to medical advance directive?: No - Patient declined Type of Advance Directive: Healthcare Power of [place]; Living will; Out of facility DNR (pink MOST or yellow form) Copy of Healthcare Power of Attorney in Chart?: No - copy requested Copy of Living Will in Chart?: No - copy requested Out of facility DNR (pink MOST or yellow form) in Chart? (Ambulatory ONLY): No - copy requested  Reviewed/Updated  Reviewed/Updated: Reviewed All (Medical, Surgical, Family, Medications, Allergies, Care Teams, Patient Goals)    Allergies (verified) Procaine hcl, Ciprofloxacin , Macrobid  [nitrofurantoin  macrocrystal], Mupirocin , Sulfonamide derivatives, Tetanus toxoid, Tuberculin ppd, Latex, and Penicillins   Current Medications (verified) Outpatient Encounter Medications as of 11/06/2024  Medication Sig   acetaminophen  (TYLENOL ) 650 MG CR tablet Take 650 mg by mouth every 8 (eight) hours as needed for pain.   anastrozole  (ARIMIDEX ) 1 MG tablet Take 1 tablet (1 mg total) by mouth daily.   aspirin 81 MG tablet Take 81 mg by mouth daily.   atorvastatin  (LIPITOR) 40 MG tablet Take 1 tablet (40 mg total) by mouth daily for cholesterol.   Calcium  Carbonate-Vit D-Min (CALCIUM  1200 PO) Take by mouth.   cyanocobalamin  (VITAMIN B12) 1000 MCG tablet Take 1,000 mcg by mouth daily.   dicyclomine  (BENTYL ) 20 MG tablet Take 1 tablet (20 mg total) by mouth 3 (three) times daily as needed for Nausea, Bloating, Cramping or Diarrhea.                              /   dorzolamide -timolol  (COSOPT ) 2-0.5 % ophthalmic solution Place 1  drop into both eyes every 12 (twelve) hours.   ezetimibe  (ZETIA ) 10 MG tablet Take 1 tablet (10 mg total) by mouth daily for cholesterol.   furosemide  (LASIX ) 40 MG tablet 1 to 2 tablets daily as needed for blood pressure or fluid retention   levothyroxine  (SYNTHROID ) 100 MCG tablet Take 0.5 tablet (50 mcg) by mouth daily on Mon-Wed-Fri and take 1 tablet(100 mcg) daily on all other days   Multiple Vitamin (MULTIVITAMIN) tablet Take 1 tablet by mouth daily.   pantoprazole  (PROTONIX ) 40 MG tablet Take 1 tablet (40 mg total) by mouth daily to prevent heartburn and indigestion   pregabalin  (LYRICA ) 100 MG capsule Take 1 capsule (100 mg total) by mouth 3 (three) times daily.   Probiotic Product (PROBIOTIC DAILY PO) Take 1 capsule by mouth daily.   tirzepatide  (ZEPBOUND ) 2.5 MG/0.5ML Pen Inject 2.5 mg into the skin once a week.   methocarbamol  (ROBAXIN ) 500 MG tablet Take 500 mg by mouth as needed for muscle spasms. (Patient not taking: Reported on 11/06/2024)   No facility-administered encounter medications on file as of 11/06/2024.    History: Past Medical History:  Diagnosis Date   Allergy    Anemia    Arthritis    Basal cell carcinoma    [facility]   Breast cancer [facility])    Cataract    Chest tightness    Chronic kidney disease    Diabetes mellitus without complication (HCC)    Fatigue    GERD (gastroesophageal reflux disease)    Glaucoma    [name] foot 05/26/2023   Heart palpitations 12/06/2019   History of basal cell carcinoma (BCC) 08/10/2017  [facility]     Hyperlipidemia    Hypothyroid    Internal hemorrhoids 05/04/2009   Qualifier: Diagnosis of  By: Nelson-Smith CMA (AAMA), Dottie     Melanoma [facility]) 2013   Excision/surgery only. No other treatment   Myalgia    Neuromuscular disorder (HCC)    Neuropathy   Obesity    Prediabetes    Ulcer    Varicose veins    Past Surgical History:  Procedure Laterality Date   ABDOMINAL HYSTERECTOMY      Total    BREAST LUMPECTOMY WITH RADIOACTIVE SEED LOCALIZATION [name] 02/24/2022   Procedure: [name] BREAST LUMPECTOMY WITH RADIOACTIVE SEED LOCALIZATION;  Surgeon: Vernetta Berg, MD;  Location: [facility] OR;  Service: General;  Laterality: [name];   BREAST SURGERY     CATARACT EXTRACTION Bilateral    Dr. Milan   CYST REMOVAL HAND Left 07/30/2018   EYE SURGERY     NASAL SINUS SURGERY     ROTATOR CUFF REPAIR Left more than 7 years ago   Dr. Beverley and Dr. Alona   TONSILLECTOMY AND ADENOIDECTOMY     Family History  Problem Relation Age of Onset   Dementia Mother    Lung cancer Mother        dx 12s; smoking hx   Cancer Mother    Varicose Veins Mother    Coronary artery disease Father 43   Heart disease Father    Lung cancer Maternal Grandmother        non-smoker; dx 21s   Cancer Maternal Grandmother    Dementia Maternal Aunt    Breast cancer Cousin        maternal female cousin; dx 5s   Social History   Occupational History   Occupation: [employer]    Employer: [employer]  Tobacco Use   Smoking status: Former    Current packs/day: 0.00    Average packs/day: 0.5 packs/day for 50.0 years (25.0 ttl pk-yrs)    Types: Cigarettes    Start date: 40    Quit date: 05/27/2013    Years since quitting: 11.4   Smokeless tobacco: Never  Vaping Use   Vaping status: Never Used  Substance and Sexual Activity   Alcohol use: Never   Drug use: Never   Sexual activity: Not Currently   Tobacco Counseling Counseling given: Not Answered  SDOH Screenings   Food Insecurity: No Food Insecurity (11/01/2024)  Housing: Low Risk (11/01/2024)  Transportation Needs: No Transportation Needs (11/01/2024)  Utilities: Not At Risk (11/06/2024)  Depression (PHQ2-9): Low Risk (11/06/2024)  Financial Resource Strain: Low Risk (11/01/2024)  Physical Activity: Patient Declined (11/01/2024)  Social Connections: Moderately Isolated (11/01/2024)  Stress: No Stress Concern Present (11/01/2024)   Tobacco Use: Medium Risk (11/06/2024)  Health Literacy: Adequate Health Literacy (11/06/2024)   See flowsheets for full screening details  Depression Screen PHQ 2 & 9 Depression Scale- Over the past 2 weeks, how often have you been bothered by any of the following problems? Little interest or pleasure in doing things: 0 Feeling down, depressed, or hopeless (PHQ Adolescent also includes...irritable): 0 PHQ-2 Total Score: 0 Trouble falling or staying asleep, or sleeping too much: 0 Feeling tired or having little energy: 0 Poor appetite or overeating (PHQ Adolescent also includes...weight loss): 0 Feeling bad about yourself - or that you are a failure or have let yourself or your family down: 0 Trouble concentrating on things, such as reading the newspaper or watching television (PHQ Adolescent also includes...like school work): 0  Moving or speaking so slowly that other people could have noticed. Or the opposite - being so fidgety or restless that you have been moving around a lot more than usual: 0 Thoughts that you would be better off dead, or of hurting yourself in some way: 0 PHQ-9 Total Score: 0 If you checked off any problems, how difficult have these problems made it for you to do your work, take care of things at home, or get along with other people?: Not difficult at all  Depression Treatment Depression Interventions/Treatment : [id number] Score <4 Follow-up Not Indicated     Goals Addressed               This Visit's Progress     Patient Stated (pt-stated)        Patient states she will like to lose some weight              Objective:    Today's Vitals   11/06/24 1347  BP: 124/67  Weight: 227 lb (103 kg)  Height: 5' 6.75 (1.695 m)   Body mass index is 35.82 kg/m.  Hearing/Vision screen Hearing Screening - Comments:: No difficulties Vision Screening - Comments:: Patient wears glasses . See dr Thom Hamilton  Immunizations and Health Maintenance Health  Maintenance  Topic Date Due   Lung Cancer Screening  06/30/2023   OPHTHALMOLOGY EXAM  01/24/2024   Medicare Annual Wellness (AWV)  02/13/2024   Diabetic kidney evaluation - Urine ACR  11/08/2024   Influenza Vaccine  02/18/2025 (Originally 06/21/2024)   HEMOGLOBIN A1C  01/02/2025   Mammogram  02/26/2025   FOOT EXAM  04/23/2025   Diabetic kidney evaluation - eGFR measurement  08/26/2025   Bone Density Scan  06/27/2026   Pneumococcal Vaccine: 50+ Years  Completed   Hepatitis C Screening  Completed   Meningococcal B Vaccine  Aged Out   DTaP/Tdap/Td  Discontinued   Colonoscopy  Discontinued   COVID-19 Vaccine  Discontinued   Zoster Vaccines- Shingrix  Discontinued        Assessment/Plan:  This is a routine wellness examination for [name].  Patient Care Team: [name] Charlies LABOR, DO as PCP - General (Family Medicine) Lonni Slain, MD as PCP - Cardiology (Cardiology) Hamilton Thom, OD as Referring Physician (Optometry) Craig Alan JONELLE DEVONNA as Referring Physician (Physician Assistant) Vernetta Berg, MD as Consulting Physician (General Surgery) Odean Potts, MD as Consulting Physician (Hematology and Oncology) Izell Domino, MD as Attending Physician (Radiation Oncology) Anderson, Dena D, PA-C (Physician Assistant) Lonni Slain, MD as Consulting Physician (Cardiology)  I have personally reviewed and noted the following in the patients chart:   Medical and social history Use of alcohol, tobacco or illicit drugs  Current medications and supplements including opioid prescriptions. Functional ability and status Nutritional status Physical activity Advanced directives List of other physicians Hospitalizations, surgeries, and ER visits in previous 12 months Vitals Screenings to include cognitive, depression, and falls Referrals and appointments  No orders of the defined types were placed in this encounter.  In addition, I have reviewed and discussed with  patient certain preventive protocols, quality metrics, and best practice recommendations. A written personalized care plan for preventive services as well as general preventive health recommendations were provided to patient.   [name], [place]   11/06/2024   No follow-ups on file.  After Visit Summary: (MyChart) Due to this being a telephonic visit, the after visit summary with patients personalized plan was offered to patient via MyChart   Nurse Notes:  nothing to report

## 2024-11-11 ENCOUNTER — Ambulatory Visit

## 2024-11-11 ENCOUNTER — Encounter: Payer: PPO | Admitting: Nurse Practitioner

## 2024-11-25 ENCOUNTER — Other Ambulatory Visit: Payer: Self-pay

## 2024-11-25 ENCOUNTER — Other Ambulatory Visit (HOSPITAL_COMMUNITY): Payer: Self-pay

## 2024-11-26 ENCOUNTER — Other Ambulatory Visit (HOSPITAL_COMMUNITY): Payer: Self-pay

## 2024-11-29 ENCOUNTER — Ambulatory Visit: Admitting: Family Medicine

## 2024-12-02 ENCOUNTER — Encounter: Payer: Self-pay | Admitting: Family Medicine

## 2024-12-02 ENCOUNTER — Ambulatory Visit: Admitting: Family Medicine

## 2024-12-02 VITALS — BP 122/62 | HR 57 | Temp 98.3°F | Wt 229.8 lb

## 2024-12-02 DIAGNOSIS — R6889 Other general symptoms and signs: Secondary | ICD-10-CM

## 2024-12-02 DIAGNOSIS — J209 Acute bronchitis, unspecified: Secondary | ICD-10-CM

## 2024-12-02 DIAGNOSIS — J988 Other specified respiratory disorders: Secondary | ICD-10-CM

## 2024-12-02 DIAGNOSIS — B9689 Other specified bacterial agents as the cause of diseases classified elsewhere: Secondary | ICD-10-CM

## 2024-12-02 LAB — POC COVID19 BINAXNOW: SARS Coronavirus 2 Ag: NEGATIVE

## 2024-12-02 MED ORDER — AZITHROMYCIN 250 MG PO TABS: ORAL_TABLET | ORAL | 0 refills | Status: AC

## 2024-12-02 NOTE — Progress Notes (Signed)
 "      [name] , Mar 25, 1946, 79 y.o., female MRN: [id number] Patient Care Team    Relationship Specialty Notifications Start End  [name] Charlies LABOR, DO PCP - General Family Medicine  07/02/24   Lonni Slain, MD PCP - Cardiology Cardiology  07/16/24   Glendia Simmonds, OD Referring Physician Optometry  05/12/14   Craig Alan JONELLE DEVONNA Referring Physician Physician Assistant  04/02/18   Vernetta Berg, MD Consulting Physician General Surgery  01/31/22   Odean Potts, MD Consulting Physician Hematology and Oncology  01/31/22   Izell Domino, MD Attending Physician Radiation Oncology  01/31/22   Lenon Reda BIRCH, PA-C  Physician Assistant  07/04/24   Lonni Slain, MD Consulting Physician Cardiology  07/04/24     Chief Complaint  Patient presents with   Cough    Since 1/2; dry cough, fatigue, body aches. Pt has tried Tylenol  Cold & Flu.      Subjective: [name] is a 79 y.o. Pt presents for an OV with complaints of cough, fatigue of 10 days duration.  Associated symptoms include (see ROS).  Patient reports she has been extremely fatigued for the last 10 days.  She has not been exposed to any illnesses that she is aware of.  Pt has tried tylenol  cold and Flu to ease their symptoms.      11/06/2024    1:54 PM 07/02/2024   10:45 AM 02/15/2024    1:50 PM 05/30/2023    1:06 AM 02/13/2023    4:45 PM  Depression screen PHQ 2/9  Decreased Interest 0 0 0 0 0  Down, Depressed, Hopeless 0 0 0 0 0  PHQ - 2 Score 0 0 0 0 0  Altered sleeping 0 0 0    Tired, decreased energy 0 1 1    Change in appetite 0 0 0    Feeling bad or failure about yourself  0 0 0    Trouble concentrating 0 0 0    Moving slowly or fidgety/restless 0 0 0    Suicidal thoughts 0 0 0    PHQ-9 Score 0 1  1     Difficult doing work/chores Not difficult at all Not difficult at all Not difficult at all       Data saved with a previous flowsheet row definition    Allergies[1] Social History   Social  History Narrative   Lives alone.  Widow.     Past Medical History:  Diagnosis Date   Allergy    Anemia    Arthritis    Basal cell carcinoma    [facility]   Breast cancer [facility])    Cataract    Chest tightness    Chronic kidney disease    Diabetes mellitus without complication (HCC)    Fatigue    GERD (gastroesophageal reflux disease)    Glaucoma    Hammertoe of right foot 05/26/2023   Heart palpitations 12/06/2019   History of basal cell carcinoma (BCC) 08/10/2017   [facility]     Hyperlipidemia    Hypothyroid    Internal hemorrhoids 05/04/2009   Qualifier: Diagnosis of  By: Nelson-Smith CMA (AAMA), Dottie     Melanoma [facility]) 2013   Excision/surgery only. No other treatment   Myalgia    Neuromuscular disorder (HCC)    Neuropathy   Obesity    Prediabetes    Ulcer    Varicose veins    Past Surgical History:  Procedure Laterality Date  ABDOMINAL HYSTERECTOMY     Total    BREAST LUMPECTOMY WITH RADIOACTIVE SEED LOCALIZATION Right 02/24/2022   Procedure: RIGHT BREAST LUMPECTOMY WITH RADIOACTIVE SEED LOCALIZATION;  Surgeon: Vernetta Berg, MD;  Location: [facility] OR;  Service: General;  Laterality: Right;   BREAST SURGERY     CATARACT EXTRACTION Bilateral    Dr. Milan   CYST REMOVAL HAND Left 07/30/2018   EYE SURGERY     NASAL SINUS SURGERY     ROTATOR CUFF REPAIR Left more than 7 years ago   Dr. Beverley and Dr. Alona   TONSILLECTOMY AND ADENOIDECTOMY     Family History  Problem Relation Age of Onset   Dementia Mother    Lung cancer Mother        dx 67s; smoking hx   Cancer Mother    Varicose Veins Mother    Coronary artery disease Father 72   Heart disease Father    Lung cancer Maternal Grandmother        non-smoker; dx 31s   Cancer Maternal Grandmother    Dementia Maternal Aunt    Breast cancer Cousin        maternal female cousin; dx 33s   Allergies as of 12/02/2024       Reactions   Procaine Hcl Shortness Of Breath   Ok to use sq  lidocaine  for iv starts   Ciprofloxacin     Unknown reaction   Macrobid  [nitrofurantoin  Macrocrystal]    Unknown reaction   Mupirocin  Dermatitis   Patient states toe turned red after applying medication.   Sulfonamide Derivatives Hives   Tetanus Toxoid Swelling   REACTION: arm swells   Tuberculin Ppd Swelling   Latex Rash   Penicillins Rash        Medication List        Accurate as of December 02, 2024 11:41 AM. If you have any questions, ask your nurse or doctor.          acetaminophen  650 MG CR tablet Commonly known as: TYLENOL  Take 650 mg by mouth every 8 (eight) hours as needed for pain.   anastrozole  1 MG tablet Commonly known as: ARIMIDEX  Take 1 tablet (1 mg total) by mouth daily.   aspirin 81 MG tablet Take 81 mg by mouth daily.   atorvastatin  40 MG tablet Commonly known as: LIPITOR Take 1 tablet (40 mg total) by mouth daily for cholesterol.   azithromycin  250 MG tablet Commonly known as: ZITHROMAX  Take 2 tablets on day 1, then 1 tablet daily on days 2 through 5 Started by: Charlies Bellini, DO   CALCIUM  1200 PO Take by mouth.   cyanocobalamin  1000 MCG tablet Commonly known as: VITAMIN B12 Take 1,000 mcg by mouth daily.   dicyclomine  20 MG tablet Commonly known as: BENTYL  Take 1 tablet (20 mg total) by mouth 3 (three) times daily as needed for Nausea, Bloating, Cramping or Diarrhea.                              /   dorzolamide -timolol  2-0.5 % ophthalmic solution Commonly known as: COSOPT  Place 1 drop into both eyes every 12 (twelve) hours.   ezetimibe  10 MG tablet Commonly known as: ZETIA  Take 1 tablet (10 mg total) by mouth daily for cholesterol.   furosemide  40 MG tablet Commonly known as: LASIX  1 to 2 tablets daily as needed for blood pressure or fluid retention   levothyroxine  100 MCG tablet Commonly known  as: SYNTHROID  Take 0.5 tablet (50 mcg) by mouth daily on Mon-Wed-Fri and take 1 tablet(100 mcg) daily on all other days    methocarbamol  500 MG tablet Commonly known as: ROBAXIN  Take 500 mg by mouth as needed for muscle spasms.   multivitamin tablet Take 1 tablet by mouth daily.   pantoprazole  40 MG tablet Commonly known as: PROTONIX  Take 1 tablet (40 mg total) by mouth daily to prevent heartburn and indigestion   pregabalin  100 MG capsule Commonly known as: LYRICA  Take 1 capsule (100 mg total) by mouth 3 (three) times daily.   PROBIOTIC DAILY PO Take 1 capsule by mouth daily.   Zepbound  2.5 MG/0.5ML Pen Generic drug: tirzepatide  Inject 2.5 mg into the skin once a week.        All past medical history, surgical history, allergies, family history, immunizations andmedications were updated in the EMR today and reviewed under the history and medication portions of their EMR.     Review of Systems  Constitutional:  Positive for malaise/fatigue. Negative for chills and fever.  HENT: Negative.    Eyes: Negative.   Respiratory:  Positive for cough. Negative for sputum production.   Cardiovascular: Negative.   Gastrointestinal:  Positive for nausea. Negative for abdominal pain, constipation, diarrhea and vomiting.  Genitourinary: Negative.  Negative for dysuria, frequency and urgency.  Musculoskeletal:  Positive for myalgias.  Skin:  Negative for rash.  Neurological:  Positive for headaches. Negative for dizziness.  Endo/Heme/Allergies: Negative.    Negative, with the exception of above mentioned in HPI   Objective:  BP 122/62   Pulse (!) 57   Temp 98.3 F (36.8 C)   Wt 229 lb 12.8 oz (104.2 kg)   SpO2 97%   BMI 36.26 kg/m  Body mass index is 36.26 kg/m.  Physical Exam Vitals and nursing note reviewed.  Constitutional:      General: She is not in acute distress.    Appearance: Normal appearance. She is normal weight. She is not ill-appearing or toxic-appearing.  HENT:     Head: Normocephalic and atraumatic.     Right Ear: Tympanic membrane, ear canal and external ear normal. There  is no impacted cerumen.     Left Ear: Tympanic membrane, ear canal and external ear normal. There is no impacted cerumen.     Nose: Congestion and rhinorrhea present.     Mouth/Throat:     Mouth: Mucous membranes are moist.     Pharynx: No oropharyngeal exudate or posterior oropharyngeal erythema.  Eyes:     General: No scleral icterus.       Right eye: No discharge.        Left eye: No discharge.     Extraocular Movements: Extraocular movements intact.     Conjunctiva/sclera: Conjunctivae normal.     Pupils: Pupils are equal, round, and reactive to light.  Cardiovascular:     Rate and Rhythm: Normal rate and regular rhythm.     Heart sounds: No murmur heard. Pulmonary:     Effort: Pulmonary effort is normal. No respiratory distress.     Breath sounds: Normal breath sounds. No wheezing, rhonchi or rales.     Comments: Dry cough present Musculoskeletal:     Cervical back: Neck supple.     Right lower leg: No edema.     Left lower leg: No edema.  Lymphadenopathy:     Cervical: No cervical adenopathy.  Skin:    Findings: No rash.  Neurological:     Mental  Status: She is alert and oriented to person, place, and time. Mental status is at baseline.     Motor: No weakness.     Coordination: Coordination normal.     Gait: Gait normal.  Psychiatric:        Mood and Affect: Mood normal.        Behavior: Behavior normal.        Thought Content: Thought content normal.        Judgment: Judgment normal.      No results found. No results found. Results for orders placed or performed in visit on 12/02/24 (from the past 24 hours)  POC COVID-19 BinaxNow     Status: Normal   Collection Time: 12/02/24 11:37 AM  Result Value Ref Range   SARS Coronavirus 2 Ag Negative Negative    Assessment/Plan: [name] is a 79 y.o. female present for OV for  Bronchitis greater than 10 days/bacterial respiratory infection -certainly sounds consistent with influenza initially.  Now 10 days  out. Rest, hydrate.  mucinex (DM if cough), nettie pot or nasal saline.  Z-pack prescribed, take until completed.  If cough present it can last up to 6-8 weeks.  F/U 2 weeks of not improved.   Reviewed expectations re: course of current medical issues. Discussed self-management of symptoms. Outlined signs and symptoms indicating need for more acute intervention. Patient verbalized understanding and all questions were answered. Patient received an After-Visit Summary.    Orders Placed This Encounter  Procedures   POC COVID-19 BinaxNow   Meds ordered this encounter  Medications   azithromycin  (ZITHROMAX ) 250 MG tablet    Sig: Take 2 tablets on day 1, then 1 tablet daily on days 2 through 5    Dispense:  6 tablet    Refill:  0   Referral Orders  No referral(s) requested today     Note is dictated utilizing voice recognition software. Although note has been proof read prior to signing, occasional typographical errors still can be missed. If any questions arise, please do not hesitate to call for verification.   electronically signed by:  Charlies Bellini, DO  [facility] - [place]       [1]  Allergies Allergen Reactions   Procaine Hcl Shortness Of Breath    Ok to use sq lidocaine  for iv starts   Ciprofloxacin      Unknown reaction   Macrobid  [Nitrofurantoin  Macrocrystal]     Unknown reaction   Mupirocin  Dermatitis    Patient states toe turned red after applying medication.   Sulfonamide Derivatives Hives   Tetanus Toxoid Swelling    REACTION: arm swells   Tuberculin Ppd Swelling   Latex Rash   Penicillins Rash   "

## 2024-12-02 NOTE — Patient Instructions (Signed)

## 2024-12-04 ENCOUNTER — Other Ambulatory Visit: Payer: Self-pay

## 2024-12-04 ENCOUNTER — Other Ambulatory Visit (HOSPITAL_COMMUNITY): Payer: Self-pay

## 2024-12-12 ENCOUNTER — Other Ambulatory Visit (HOSPITAL_COMMUNITY): Payer: Self-pay

## 2024-12-17 ENCOUNTER — Encounter: Admitting: Family Medicine

## 2024-12-17 DIAGNOSIS — K2100 Gastro-esophageal reflux disease with esophagitis, without bleeding: Secondary | ICD-10-CM

## 2024-12-18 NOTE — Progress Notes (Signed)
 Same-day cancel- weather related

## 2024-12-26 NOTE — Progress Notes (Signed)
 [name]                                          MRN: [id number]   12/26/2024   The [facility] Quality Team Specialist reviewed this patient medical record for the purposes of chart review for care gap closure. The following were reviewed: chart review for care gap closure-kidney health evaluation for diabetes:eGFR  and uACR.    [facility] Quality Team

## 2024-12-27 ENCOUNTER — Ambulatory Visit: Payer: Self-pay

## 2024-12-27 NOTE — Telephone Encounter (Signed)
 FYI Only or Action Required?: Action required by provider: request for appointment.  Patient was last seen in primary care on 12/02/2024 by Brandi Fuller A, DO.  Called Nurse Triage reporting Fatigue.  Symptoms began about [name] month ago.  Interventions attempted: Nothing.  Symptoms are: unchanged.  Triage Disposition: See HCP Within 4 Hours (Or PCP Triage)  Patient/caregiver understands and will follow disposition?: No, wishes to speak with PCP   Message from [name] sent at 12/27/2024  9:22 AM EST  Summary: Fatigue   Reason for Triage: Patient is very fatigued, has [name] hard time getting around. Been going on since Thanksgiving, but getting worse.          Reason for Disposition  [1] MODERATE weakness ([name].g., interferes with work, school, normal activities) AND [2] cause unknown  (Exceptions: Weakness from acute minor illness or poor fluid intake; weakness is chronic and not worse.)  Answer Assessment - Initial Assessment Questions Patient requests appt for next Monday, due to transportation.  Advised UC now/4hrs or ED/911 if symptoms occur/worsen: severe diff breathing, chest pain > 5 min, faint. Patient verbalized understanding.   1. DESCRIPTION: Describe how you are feeling. Getting harder to anything myself; Sob exertion 2. SEVERITY: How bad is it?  Can you stand and walk?     yes 3. ONSET: When did these symptoms begin? ([name].g., hours, days, weeks, months)     Since Nov 2025 4. CAUSE: What do you think is causing the weakness or fatigue? ([name].g., not drinking enough fluids, medical problem, trouble sleeping)     Able to eat and drink, decreased appeitite, urinating normal, last bm yesterday 5. NEW MEDICINES:  Have you started on any new medicines recently? ([name].g., opioid pain medicines, benzodiazepines, muscle relaxants, antidepressants, antihistamines, neuroleptics, beta blockers)     no 6. OTHER SYMPTOMS: Do you have any other symptoms? ([name].g., chest pain,  fever, cough, SOB, vomiting, diarrhea, bleeding, other areas of pain)   Nose bleed x1,  phlegm greenish/yellow, runny nose, denies coughing, chest pain, fever chills, n/v, abd pain, black/blood stools  Protocols used: Weakness (Generalized) and Fatigue-[name]-AH

## 2024-12-27 NOTE — Telephone Encounter (Signed)
 Spoke with patient regarding recommendations. Pt does not have transportation today but will call EMS if needed over the weekend. Pt is scheduled for 02/09

## 2024-12-30 ENCOUNTER — Ambulatory Visit: Admitting: Family Medicine

## 2024-12-31 ENCOUNTER — Ambulatory Visit: Admitting: Podiatry

## 2025-01-07 ENCOUNTER — Ambulatory Visit: Admitting: Family Medicine

## 2025-01-21 ENCOUNTER — Encounter: Admitting: Family Medicine

## 2025-02-26 ENCOUNTER — Ambulatory Visit

## 2025-04-22 ENCOUNTER — Ambulatory Visit: Admitting: Hematology and Oncology
